# Patient Record
Sex: Female | Born: 1975 | Race: White | Hispanic: No | Marital: Married | Smoking: Former smoker
Health system: Southern US, Community
[De-identification: ages and names within clinical notes are randomized; demographics above are authoritative.]

## PROBLEM LIST (undated history)

## (undated) DIAGNOSIS — G4730 Sleep apnea, unspecified: Secondary | ICD-10-CM

## (undated) DIAGNOSIS — F32A Depression, unspecified: Secondary | ICD-10-CM

## (undated) DIAGNOSIS — T8859XA Other complications of anesthesia, initial encounter: Secondary | ICD-10-CM

## (undated) DIAGNOSIS — K219 Gastro-esophageal reflux disease without esophagitis: Secondary | ICD-10-CM

## (undated) DIAGNOSIS — J329 Chronic sinusitis, unspecified: Secondary | ICD-10-CM

## (undated) DIAGNOSIS — R0602 Shortness of breath: Secondary | ICD-10-CM

## (undated) DIAGNOSIS — F419 Anxiety disorder, unspecified: Secondary | ICD-10-CM

## (undated) DIAGNOSIS — F329 Major depressive disorder, single episode, unspecified: Secondary | ICD-10-CM

## (undated) DIAGNOSIS — M1990 Unspecified osteoarthritis, unspecified site: Secondary | ICD-10-CM

## (undated) DIAGNOSIS — F909 Attention-deficit hyperactivity disorder, unspecified type: Secondary | ICD-10-CM

## (undated) DIAGNOSIS — E7800 Pure hypercholesterolemia, unspecified: Secondary | ICD-10-CM

## (undated) DIAGNOSIS — K649 Unspecified hemorrhoids: Secondary | ICD-10-CM

## (undated) DIAGNOSIS — Z8709 Personal history of other diseases of the respiratory system: Secondary | ICD-10-CM

## (undated) DIAGNOSIS — E2839 Other primary ovarian failure: Secondary | ICD-10-CM

## (undated) DIAGNOSIS — I10 Essential (primary) hypertension: Secondary | ICD-10-CM

## (undated) DIAGNOSIS — Z8739 Personal history of other diseases of the musculoskeletal system and connective tissue: Secondary | ICD-10-CM

## (undated) DIAGNOSIS — R519 Headache, unspecified: Secondary | ICD-10-CM

## (undated) DIAGNOSIS — K2950 Unspecified chronic gastritis without bleeding: Secondary | ICD-10-CM

## (undated) DIAGNOSIS — S82831A Other fracture of upper and lower end of right fibula, initial encounter for closed fracture: Secondary | ICD-10-CM

## (undated) DIAGNOSIS — N390 Urinary tract infection, site not specified: Secondary | ICD-10-CM

## (undated) DIAGNOSIS — T4145XA Adverse effect of unspecified anesthetic, initial encounter: Secondary | ICD-10-CM

## (undated) DIAGNOSIS — Z973 Presence of spectacles and contact lenses: Secondary | ICD-10-CM

## (undated) DIAGNOSIS — R51 Headache: Secondary | ICD-10-CM

## (undated) DIAGNOSIS — F4310 Post-traumatic stress disorder, unspecified: Secondary | ICD-10-CM

## (undated) HISTORY — DX: Essential (primary) hypertension: I10

## (undated) HISTORY — DX: Unspecified hemorrhoids: K64.9

## (undated) HISTORY — DX: Attention-deficit hyperactivity disorder, unspecified type: F90.9

## (undated) HISTORY — PX: ESOPHAGOGASTRODUODENOSCOPY: SHX1529

## (undated) HISTORY — DX: Post-traumatic stress disorder, unspecified: F43.10

## (undated) HISTORY — PX: WISDOM TOOTH EXTRACTION: SHX21

## (undated) HISTORY — DX: Unspecified chronic gastritis without bleeding: K29.50

---

## 2008-05-24 HISTORY — PX: CHOLECYSTECTOMY: SHX55

## 2011-05-26 ENCOUNTER — Emergency Department (HOSPITAL_COMMUNITY)
Admission: EM | Admit: 2011-05-26 | Discharge: 2011-05-26 | Disposition: A | Discharge status: Home / Self Care | Payer: Self-pay | Attending: Emergency Medicine | Admitting: Emergency Medicine

## 2011-05-26 DIAGNOSIS — N390 Urinary tract infection, site not specified: Secondary | ICD-10-CM | POA: Insufficient documentation

## 2011-05-26 DIAGNOSIS — L988 Other specified disorders of the skin and subcutaneous tissue: Secondary | ICD-10-CM | POA: Insufficient documentation

## 2011-05-26 LAB — POCT PREGNANCY, URINE: Preg Test, Ur: NEGATIVE

## 2011-05-26 LAB — URINALYSIS, ROUTINE W REFLEX MICROSCOPIC
Glucose, UA: NEGATIVE mg/dL
Hgb urine dipstick: NEGATIVE
Protein, ur: NEGATIVE mg/dL

## 2011-05-26 LAB — URINE MICROSCOPIC-ADD ON

## 2011-05-26 MED ORDER — CIPROFLOXACIN HCL 250 MG PO TABS: 250.0000 mg | ORAL_TABLET | Freq: Two times a day (BID) | ORAL | Status: AC

## 2011-05-26 NOTE — ED Notes (Signed)
Pt reports symptoms started yesterday feeling tired, having body aches and decreased appetite. Pt reports symptoms started suddenly.  Pt reports slight headaches and mild dizziness. Pt reports low grade fever of 99.5 earlier today.  Pt denies history of others sick around her. On and off congestion. History of sinus infection, but her symptoms are different at this time.  Mild throat soreness today since coming to ED.  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

## 2011-05-26 NOTE — ED Notes (Signed)
Pt alert, nad, c/o fever, cough, body aches, onset was yesterday, resp even unlabored, skin pwd, ambulates to room, no s/s distress noted

## 2011-05-26 NOTE — ED Provider Notes (Signed)
History     CSN: [id number]  Arrival date & time 05/26/11  1758   First MD Initiated Contact with Patient 05/26/11 2106      Chief Complaint  Patient presents with  . Generalized Body Aches    x 3 days, "worn out", low grade fevers    (Consider location/radiation/quality/duration/timing/severity/associated sxs/prior treatment) The history is provided by the patient.  Pt presents with CC of generalized body aches and fatigue which started about 3 days ago. Has felt as if she's had a low grade fever as well. Denies chest pain, abd pain, dyspnea, URI sx, sore throat, headache. Has not had any nausea, vomiting, diarrhea, or urinary sx. Denies vaginal discharge. States she has been sleeping more than usual over the past several days.   She is new to the area and has not established care with PCP yet. States she was told by previous PCP that she may be borderline hypothyroid. Also recalls that she was briefly out of her Celexa recently for about a week but has resumed taking this now.  History reviewed. No pertinent past medical history.  Past Surgical History  Procedure Date  . Cholecystectomy 2010  . Cholecystectomy     No family history on file.  History  Substance Use Topics  . Smoking status: Former Games developer  . Smokeless tobacco: Not on file  . Alcohol Use: Yes     occasionally    OB History    Grav Para Term Preterm Abortions TAB SAB Ect Mult Living                  Review of Systems Comprehensive ROS reviewed and are negative except as indicated above  Allergies  Sulfa antibiotics  Home Medications   Current Outpatient Rx  Name Route Sig Dispense Refill  . CITALOPRAM HYDROBROMIDE 40 MG PO TABS Oral Take 40 mg by mouth daily.      Marland Kitchen NORGESTIMATE-ETH ESTRADIOL 0.25-35 MG-MCG PO TABS Oral Take 1 tablet by mouth daily.        BP 123/87  Pulse 84  Temp(Src) 98.9 F (37.2 C) (Oral)  Resp 20  Wt 240 lb (108.863 kg)  SpO2 99%  LMP 04/24/2011  Physical Exam    Nursing note and vitals reviewed. Constitutional: She is oriented to person, place, and time. She appears well-developed and well-nourished.  Non-toxic appearance. No distress.  HENT:  Head: Normocephalic and atraumatic.  Right Ear: External ear normal.  Left Ear: External ear normal.  Mouth/Throat: Oropharynx is clear and moist. No oropharyngeal exudate.       TMs nl, no tenderness to palp over sinuses  Eyes: Conjunctivae and EOM are normal. Pupils are equal, round, and reactive to light.  Neck: Normal range of motion. Neck supple. No thyromegaly present.  Cardiovascular: Normal rate, regular rhythm and normal heart sounds.   Pulmonary/Chest: Effort normal and breath sounds normal. No respiratory distress. She exhibits no tenderness.  Abdominal: Soft. Bowel sounds are normal. There is no tenderness. There is no rebound and no guarding.       Negative CVA tenderness b/l  Musculoskeletal: Normal range of motion. She exhibits no edema and no tenderness.  Lymphadenopathy:    She has no cervical adenopathy.  Neurological: She is alert and oriented to person, place, and time.  Skin: Skin is warm and dry. She is not diaphoretic.       Several small erythematous papules most likely c/w flea bites noted to LEs  Psychiatric: She has a normal  mood and affect.    ED Course  Procedures (including critical care time)  Labs Reviewed  URINALYSIS, ROUTINE W REFLEX MICROSCOPIC - Abnormal; Notable for the following:    APPearance CLOUDY (*)    Leukocytes, UA MODERATE (*)    All other components within normal limits  URINE MICROSCOPIC-ADD ON - Abnormal; Notable for the following:    Squamous Epithelial / LPF FEW (*)    Bacteria, UA MANY (*)    All other components within normal limits  POCT PREGNANCY, URINE  POCT PREGNANCY, URINE   No results found.   1. Urinary tract infection       MDM  UA shows probable UTI; will tx. Urine sent for cx. Nontox appearing. Discussed importance of  establishing PCP f/u should fatigue persist. Discussed return precautions.        Grant Fontana, [place] 05/27/11 [phone]

## 2011-05-27 NOTE — ED Provider Notes (Signed)
Medical screening examination/treatment/procedure(s) were performed by non-physician practitioner and as supervising physician I was immediately available for consultation/collaboration.  Juliet Rude. Rubin Payor, MD 05/27/11 [phone]

## 2011-05-28 LAB — URINE CULTURE: Colony Count: >=100000

## 2011-05-29 NOTE — ED Notes (Signed)
+   Urine culture. Treated with Cipro, sensitive to same per protocol MD. 

## 2011-06-21 ENCOUNTER — Encounter (HOSPITAL_COMMUNITY): Payer: Self-pay | Admitting: *Deleted

## 2011-06-21 ENCOUNTER — Emergency Department (HOSPITAL_COMMUNITY)
Admission: EM | Admit: 2011-06-21 | Discharge: 2011-06-22 | Disposition: A | Discharge status: Home / Self Care | Payer: Self-pay | Attending: Emergency Medicine | Admitting: Emergency Medicine

## 2011-06-21 DIAGNOSIS — J029 Acute pharyngitis, unspecified: Secondary | ICD-10-CM | POA: Insufficient documentation

## 2011-06-21 DIAGNOSIS — T148 Other injury of unspecified body region: Secondary | ICD-10-CM | POA: Insufficient documentation

## 2011-06-21 DIAGNOSIS — R5383 Other fatigue: Secondary | ICD-10-CM | POA: Insufficient documentation

## 2011-06-21 DIAGNOSIS — W57XXXA Bitten or stung by nonvenomous insect and other nonvenomous arthropods, initial encounter: Secondary | ICD-10-CM | POA: Insufficient documentation

## 2011-06-21 DIAGNOSIS — R5381 Other malaise: Secondary | ICD-10-CM | POA: Insufficient documentation

## 2011-06-21 HISTORY — DX: Major depressive disorder, single episode, unspecified: F32.9

## 2011-06-21 HISTORY — DX: Other primary ovarian failure: E28.39

## 2011-06-21 HISTORY — DX: Depression, unspecified: F32.A

## 2011-06-21 LAB — POCT PREGNANCY, URINE: Preg Test, Ur: NEGATIVE

## 2011-06-21 LAB — RAPID STREP SCREEN (MED CTR MEBANE ONLY): Streptococcus, Group A Screen (Direct): NEGATIVE

## 2011-06-21 MED ORDER — CEPHALEXIN 500 MG PO CAPS: 500.0000 mg | ORAL_CAPSULE | Freq: Four times a day (QID) | ORAL | Status: AC

## 2011-06-21 NOTE — ED Provider Notes (Signed)
History     CSN: [id number]  Arrival date & time 06/21/11  1806   None     Chief Complaint  Patient presents with  . Fatigue  . Pain  . Sore Throat    (Consider location/radiation/quality/duration/timing/severity/associated sxs/prior treatment) Patient is a 36 y.o. female presenting with pharyngitis. The history is provided by the patient. No language interpreter was used.  Sore Throat This is a recurrent problem. The current episode started 1 to 4 weeks ago. The problem occurs constantly. The problem has been gradually worsening. Associated symptoms include fatigue and a sore throat. Pertinent negatives include no abdominal pain, chest pain, chills, congestion, coughing, diaphoresis, fever, joint swelling, nausea, neck pain, numbness, swollen glands, urinary symptoms, vertigo, vomiting or weakness. The symptoms are aggravated by nothing. She has tried nothing for the symptoms.  Reports fatigue, flea bites and sore throat. 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  Past Medical History  Diagnosis Date  . Depression   . Ovarian failure     Past Surgical History  Procedure Date  . Cholecystectomy 2010  . Cholecystectomy     No family history on file.  History  Substance Use Topics  . Smoking status: Former Games developer  . Smokeless tobacco: Not on file  . Alcohol Use: Yes     occasionally    OB History    Grav Para Term Preterm Abortions TAB SAB Ect Mult Living                  Review of Systems  Constitutional: Positive for fatigue. Negative for fever, chills and diaphoresis.  HENT: Positive for sore throat. Negative for congestion and neck pain.   Respiratory: Negative for cough.   Cardiovascular: Negative for chest pain.  Gastrointestinal: Negative for nausea, vomiting and abdominal pain.  Musculoskeletal: Negative for joint swelling.  Neurological: Negative for vertigo, weakness and numbness.  All other systems reviewed and are  negative.    Allergies  Sulfa antibiotics  Home Medications   Current Outpatient Rx  Name Route Sig Dispense Refill  . CITALOPRAM HYDROBROMIDE 40 MG PO TABS Oral Take 40 mg by mouth daily.      Marland Kitchen NORGESTIMATE-ETH ESTRADIOL 0.25-35 MG-MCG PO TABS Oral Take 1 tablet by mouth daily.        BP 126/80  Pulse 89  Temp(Src) 99.3 F (37.4 C) (Oral)  Resp 16  Wt 258 lb 12.8 oz (117.391 kg)  SpO2 99%  LMP 04/24/2011  Physical Exam  Nursing note and vitals reviewed. Constitutional: She is oriented to person, place, and time. She appears well-developed and well-nourished.  HENT:  Head: Normocephalic and atraumatic.  Right Ear: Tympanic membrane, external ear and ear canal normal. No drainage or tenderness.  Left Ear: Tympanic membrane, external ear and ear canal normal. No drainage or tenderness.  Nose: No rhinorrhea or sinus tenderness. Right sinus exhibits no maxillary sinus tenderness and no frontal sinus tenderness. Left sinus exhibits no maxillary sinus tenderness and no frontal sinus tenderness.  Mouth/Throat: Uvula is midline and mucous membranes are normal. Posterior oropharyngeal erythema present. No oropharyngeal exudate or posterior oropharyngeal edema.  Eyes: Conjunctivae and EOM are normal. Pupils are equal, round, and reactive to light.  Neck: Normal range of motion. Neck supple.  Cardiovascular: Normal rate, regular rhythm, normal heart sounds and intact distal pulses.  Exam reveals no gallop and no friction rub.   No murmur heard. Pulmonary/Chest: Effort normal and breath sounds normal.  Abdominal: Soft.  Bowel sounds are normal.  Musculoskeletal: Normal range of motion. She exhibits no edema and no tenderness.  Neurological: She is alert and oriented to person, place, and time. She has normal reflexes.  Skin: Skin is warm and dry.       Multiple flea bites to LE and UE  Psychiatric: She has a normal mood and affect.    ED Course  Procedures (including critical care  time)  Labs Reviewed - No data to display No results found.   No diagnosis found.    MDM  Sore throat fatigue and multiple flea bites.  Keflex for flea bites.  Negative strep.  No thyroid goiter detected.  Will get a pcp to follow up with.        Jethro Bastos, NP 06/22/11 1158

## 2011-06-21 NOTE — ED Notes (Signed)
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

## 2011-06-22 NOTE — ED Provider Notes (Signed)
Medical screening examination/treatment/procedure(s) were performed by non-physician practitioner and as supervising physician I was immediately available for consultation/collaboration.   Hanley Seamen, MD 06/22/11 2253

## 2011-09-30 ENCOUNTER — Encounter (HOSPITAL_COMMUNITY): Payer: Self-pay | Admitting: *Deleted

## 2011-09-30 ENCOUNTER — Emergency Department (HOSPITAL_COMMUNITY)
Admission: EM | Admit: 2011-09-30 | Discharge: 2011-09-30 | Disposition: A | Discharge status: Home / Self Care | Payer: Self-pay | Attending: Emergency Medicine | Admitting: Emergency Medicine

## 2011-09-30 DIAGNOSIS — J309 Allergic rhinitis, unspecified: Secondary | ICD-10-CM | POA: Insufficient documentation

## 2011-09-30 DIAGNOSIS — Z87891 Personal history of nicotine dependence: Secondary | ICD-10-CM | POA: Insufficient documentation

## 2011-09-30 MED ORDER — MOMETASONE FUROATE 50 MCG/ACT NA SUSP: 2.0000 | Freq: Every day | NASAL | Status: DC

## 2011-09-30 NOTE — ED Notes (Signed)
Pt c/o fatigue, sore throat, hoarse voice, slight HA and nasal stuffiness x's a "couple of days now." pt denies n/v/d. Also reports fever.

## 2011-09-30 NOTE — Discharge Instructions (Signed)
Your symptoms are likely due to allergic rhinitis (seasonal allergies). You have been given a prescription for Nasonex. Please take this as prescribed, along with saline nose spray. You may also try Afrin for congestion, but do not use for more than 4 days. Return to the ED with high fever or otherwise worsening condition.  RESOURCE GUIDE  Dental Problems  Patients with Medicaid: [facility] [phone] [address]                                           [phone] [address] Phone:  [phone]                                                  Phone:  [phone]  If unable to pay or uninsured, contact:  [facility] or [facility]. to become qualified for the adult dental clinic.  Chronic Pain Problems Contact [facility]  [phone] Patients need to be referred by their primary care doctor.  Insufficient Money for Medicine Contact [employer] Way:  call "211" or [facility] [phone].  No Primary Care Doctor Call [facility]  [phone] Other agencies that provide inexpensive medical care    [facility]  [phone]    [facility]  [phone]    [facility]  [phone]    [facility]  [phone]    [facility]  7631035964    [facility]  [phone]  Psychological Services [facility]  [phone] [facility] Services  [phone] [facility]   [phone] (emergency services [phone])  Substance Abuse Resources [facility]  9303687511 [facility] [phone] [facility] [phone] Floydene Flock [phone] Residential & Outpatient Substance Abuse Program  [phone]  Abuse/Neglect [facility] Child Abuse Hotline 516-447-0066 [facility] Child Abuse Hotline (754)083-9808 (After Hours)  Emergency Shelter [facility] [phone]  Maternity  Homes [facility] at [facility] [place] [facility] [place] [phone] [facility] 5164665423  MRSA Hotline #:   228-666-1329    [facility] Resources  [facility] [place]     [employer]                          [facility] Dept. [address] [place]                       [address]      [address]  [place]                                                Cristobal Goldmann Phone:  [phone]  Phone:  [phone]                 Phone:  [phone]  [facility] Phone:  [phone]  [facility] Child Abuse Hotline 360-528-8457 838-426-3502 (After Hours)  Allergic Rhinitis Allergic rhinitis is when the mucous membranes in the nose respond to allergens. Allergens are particles in the air that cause your body to have an allergic reaction. This causes you to release allergic antibodies. Through a chain of events, these eventually cause you to release histamine into the blood stream (hence the use of antihistamines). Although meant to be protective to the body, it is this release that causes your discomfort, such as frequent sneezing, congestion and an itchy runny nose.  CAUSES  The pollen allergens may come from grasses, trees, and weeds. This is seasonal allergic rhinitis, or "hay fever." Other allergens cause year-round allergic rhinitis (perennial allergic rhinitis) such as house dust mite allergen, pet dander and mold spores.  SYMPTOMS   Nasal stuffiness (congestion).   Runny, itchy nose with sneezing and tearing of the eyes.   There is often an itching of the mouth, eyes and ears.  It cannot be cured, but it can be controlled with medications. DIAGNOSIS  If you are unable to determine the offending allergen, skin or blood testing may find it. TREATMENT   Avoid the allergen.   Medications and allergy shots (immunotherapy) can help.   Hay fever may  often be treated with antihistamines in pill or nasal spray forms. Antihistamines block the effects of histamine. There are over-the-counter medicines that may help with nasal congestion and swelling around the eyes. Check with your caregiver before taking or giving this medicine.  If the treatment above does not work, there are many new medications your caregiver can prescribe. Stronger medications may be used if initial measures are ineffective. Desensitizing injections can be used if medications and avoidance fails. Desensitization is when a patient is given ongoing shots until the body becomes less sensitive to the allergen. Make sure you follow up with your caregiver if problems continue. SEEK MEDICAL CARE IF:   You develop fever (more than 100.5 F (38.1 C).   You develop a cough that does not stop easily (persistent).   You have shortness of breath.   You start wheezing.   Symptoms interfere with normal daily activities.  Document Released: 02/02/2001 Document Revised: 04/29/2011 Document Reviewed: 08/14/2008 [facility] Patient Information 2012 [place], [place].

## 2011-09-30 NOTE — ED Provider Notes (Signed)
History     CSN: [id number]  Arrival date & time 09/30/11  1928   First MD Initiated Contact with Patient 09/30/11 2101      Chief Complaint  Patient presents with  . Sore Throat  . Nasal Congestion    (Consider location/radiation/quality/duration/timing/severity/associated sxs/prior treatment) HPI History from patient. 36 year old female who presents with fatigue, congestion, sore throat, cough for the past 4 days. She states that she believes she has had a "low-grade fever" but has not taken her temperature. Denies any chills. No changes in appetite. Cough has been nonproductive in nature without hemoptysis. She denies chest pain or shortness of breath with this. She does have a history of seasonal allergic rhinitis, and believes that it is possible that her symptoms are related to this. She currently is taking Zyrtec daily for her allergic rhinitis. She denies nausea, vomiting, diarrhea, abdominal pain. Denies urinary symptoms. Has been taking over-the-counter medications, such as Tylenol cough and cold without relief.  Past Medical History  Diagnosis Date  . Depression   . Ovarian failure     Past Surgical History  Procedure Date  . Cholecystectomy 2010  . Cholecystectomy     History reviewed. No pertinent family history.  History  Substance Use Topics  . Smoking status: Former Games developer  . Smokeless tobacco: Not on file  . Alcohol Use: Yes     occasionally    OB History    Grav Para Term Preterm Abortions TAB SAB Ect Mult Living                  Review of Systems as per history of present illness  Allergies  Sulfa antibiotics  Home Medications   Current Outpatient Rx  Name Route Sig Dispense Refill  . CETIRIZINE HCL 10 MG PO TABS Oral Take 10 mg by mouth daily.    Marland Kitchen VITAMIN D3 3000 UNITS PO TABS Oral Take 1 tablet by mouth daily.    Marland Kitchen CITALOPRAM HYDROBROMIDE 40 MG PO TABS Oral Take 40 mg by mouth daily.      Marland Kitchen VITAMIN B 12 PO Oral Take 1 tablet by mouth  daily.    Marland Kitchen KELP PO Oral Take 1 tablet by mouth every morning. Hyperthyroidism    . NAPROXEN 500 MG PO TABS Oral Take 500 mg by mouth every evening.      BP 116/92  Pulse 70  Temp(Src) 99.1 F (37.3 C) (Oral)  Resp 18  SpO2 99%  Physical Exam  Nursing note and vitals reviewed. Constitutional: She appears well-developed and well-nourished. No distress.  HENT:  Head: Normocephalic and atraumatic.  Right Ear: External ear normal.  Left Ear: External ear normal.  Mouth/Throat: Oropharynx is clear and moist. No oropharyngeal exudate.       Tympanic membranes normal bilaterally. Sinuses nontender to palpation or percussion. Posterior oropharynx clear without erythema.  Eyes: Conjunctivae and EOM are normal. Pupils are equal, round, and reactive to light.  Neck: Normal range of motion. Neck supple.  Cardiovascular: Normal rate, regular rhythm and normal heart sounds.   Pulmonary/Chest: Effort normal and breath sounds normal. She exhibits no tenderness.  Abdominal: Soft. There is no tenderness. There is no rebound and no guarding.  Musculoskeletal: Normal range of motion.  Lymphadenopathy:    She has no cervical adenopathy.  Neurological: She is alert.  Skin: Skin is warm and dry. She is not diaphoretic.  Psychiatric: She has a normal mood and affect.    ED Course  Procedures (including critical  care time)  Labs Reviewed - No data to display No results found.   1. Allergic rhinitis       MDM  Patient with history of allergic rhinitis presents with congestion, sore throat, fatigue for the past several days. Suspect this is likely related to her allergic rhinitis. She states that she has had Nasonex in the past and responded well to this. Prescription given for this. She is encouraged to continue her current treatments. Discussed adding saline nasal spray/neti pot. Return precautions discussed. She verbalized understanding and was agreeable with this  plan.        Grant Fontana, [place] 10/01/11 2136

## 2011-10-03 NOTE — ED Provider Notes (Signed)
Medical screening examination/treatment/procedure(s) were performed by non-physician practitioner and as supervising physician I was immediately available for consultation/collaboration.  Aranda Bihm, MD 10/03/11 1108 

## 2011-10-05 ENCOUNTER — Ambulatory Visit: Payer: Self-pay | Admitting: Physician Assistant

## 2011-10-05 ENCOUNTER — Encounter: Payer: Self-pay | Admitting: Physician Assistant

## 2011-10-05 VITALS — BP 119/87 | HR 103 | Temp 98.6°F | Resp 18 | Ht 69.0 in | Wt 279.4 lb

## 2011-10-05 DIAGNOSIS — J029 Acute pharyngitis, unspecified: Secondary | ICD-10-CM

## 2011-10-05 DIAGNOSIS — J019 Acute sinusitis, unspecified: Secondary | ICD-10-CM

## 2011-10-05 MED ORDER — AMOXICILLIN 500 MG PO CAPS: 500.0000 mg | ORAL_CAPSULE | Freq: Three times a day (TID) | ORAL | Status: AC

## 2011-10-05 NOTE — Progress Notes (Signed)
  Subjective:    Patient ID: Catherine Koch, female    DOB: [birth year], 36 y.o.   MRN: [id number]  HPI 35yo CF new to the practice presents with 10 day h/o ST, sinus congestion, now developing sinus pain.  Slight cough associated with post nasal drip.  Thinks she had a fever initially. Blowing green mucus from nose.  OTC meds help some.  Review of Systems  All other systems reviewed and are negative.       Objective:   Physical Exam  Vitals reviewed. Constitutional: She is oriented to person, place, and time. She appears well-developed and well-nourished.  HENT:  Head: Normocephalic and atraumatic.  Mouth/Throat: No oropharyngeal exudate (throat with 2+ erythema, no exudate. no swelling.).  Neck: Normal range of motion. Neck supple.  Cardiovascular: Normal rate, regular rhythm and normal heart sounds.   Pulmonary/Chest: Effort normal and breath sounds normal.  Lymphadenopathy:    She has no cervical adenopathy.  Neurological: She is alert and oriented to person, place, and time.  Skin: Skin is warm and dry.        Assessment & Plan:  Pharyngitis and sinusitis-continue OTC meds, amoxicillin.

## 2011-10-05 NOTE — Patient Instructions (Signed)
Fluids, rest, salt water gargles

## 2011-10-07 ENCOUNTER — Telehealth: Payer: Self-pay

## 2011-10-07 NOTE — Telephone Encounter (Signed)
Pt states that she is not any better, pt states that she has been taking the antibiotic that she was prescribed but it does not seem to be helping. Also pt would like an out of work note for these dates: 10/04/11-10/11/11.

## 2011-10-08 NOTE — Telephone Encounter (Signed)
Pt came into 102 and is in lobby waiting to get her [facility] note.

## 2011-10-08 NOTE — Telephone Encounter (Signed)
Steward Drone gave pt message from [place].

## 2011-10-08 NOTE — Telephone Encounter (Signed)
She needs to be seen of she is not better and has had to be out of work for 1 week.

## 2011-10-09 ENCOUNTER — Telehealth: Payer: Self-pay

## 2011-10-09 NOTE — Telephone Encounter (Signed)
Pt in office seen by [facility] pt has been out of work since Monday 10-04-11 pt needs dr note to return to work, pt is still coughing stuff  Up and is losing her voice, she would like to know if Dr reccommends she be out of work or does she need  To be seen again, she is just not able to work she still feels bad. Please contact pt she is still needing an rtw note for 05-13 thru???

## 2011-10-10 ENCOUNTER — Ambulatory Visit: Payer: Self-pay | Admitting: Family Medicine

## 2011-10-10 VITALS — BP 136/87 | HR 80 | Temp 98.2°F | Resp 16 | Ht 68.5 in | Wt 280.6 lb

## 2011-10-10 DIAGNOSIS — J019 Acute sinusitis, unspecified: Secondary | ICD-10-CM

## 2011-10-10 MED ORDER — HYDROCODONE-HOMATROPINE 5-1.5 MG/5ML PO SYRP: 5.0000 mL | ORAL_SOLUTION | Freq: Three times a day (TID) | ORAL | Status: AC | PRN

## 2011-10-10 MED ORDER — AZITHROMYCIN 250 MG PO TABS: ORAL_TABLET | ORAL | Status: AC

## 2011-10-10 MED ORDER — PREDNISONE 20 MG PO TABS: ORAL_TABLET | ORAL | Status: AC

## 2011-10-10 NOTE — Telephone Encounter (Signed)
Please advise patient to RTC for re-evaluation. 

## 2011-10-10 NOTE — Telephone Encounter (Signed)
Patient notified and will RTC.

## 2011-10-10 NOTE — Progress Notes (Signed)
  Subjective:    Patient ID: Catherine Koch, female    DOB: 1975/07/20, 36 y.o.   MRN: [id number]  HPI 36 yo female with cough and sore throat.  Evaluated in ED for same 5/9 - given nasonex for AR.  Evaluated here 5/14 - pharyngitis/sinusitis - and given amoxicillin. No better, maybe worse.  Hurts to swallow, talk, hoarse.  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  Cough has increased.  Productive.  Right ear tingles.  Sinus drainage and pain.  SUbjective fevers. FAtigue and achy as well.      Review of Systems Negative except as per HPI     Objective:   Physical Exam  Constitutional: She appears well-developed. No distress.  HENT:  Right Ear: Tympanic membrane, external ear and ear canal normal. Tympanic membrane is not injected, not scarred, not perforated, not erythematous, not retracted and not bulging.  Left Ear: Tympanic membrane, external ear and ear canal normal. Tympanic membrane is not injected, not scarred, not perforated, not erythematous, not retracted and not bulging.  Nose: No mucosal edema or rhinorrhea. Right sinus exhibits no maxillary sinus tenderness and no frontal sinus tenderness. Left sinus exhibits no maxillary sinus tenderness and no frontal sinus tenderness.  Mouth/Throat: Uvula is midline, oropharynx is clear and moist and mucous membranes are normal. No oropharyngeal exudate or tonsillar abscesses.  Cardiovascular: Normal rate, regular rhythm, normal heart sounds and intact distal pulses.   No murmur heard. Pulmonary/Chest: Effort normal and breath sounds normal. No respiratory distress. She has no wheezes. She has no rales.  Lymphadenopathy:       Head (right side): No submandibular and no preauricular adenopathy present.       Head (left side): No submandibular and no preauricular adenopathy present.       Right cervical: No superficial cervical and no posterior cervical adenopathy present.      Left cervical: No superficial cervical and no posterior  cervical adenopathy present.       Right: No supraclavicular adenopathy present.       Left: No supraclavicular adenopathy present.  Skin: Skin is warm and dry.          Assessment & Plan:  Persistent URI/worsening cough - bronchitis - prednisone, zpak, hycodan.

## 2011-12-10 ENCOUNTER — Emergency Department (HOSPITAL_COMMUNITY)
Admission: EM | Admit: 2011-12-10 | Discharge: 2011-12-10 | Disposition: A | Discharge status: Home / Self Care | Payer: Self-pay | Attending: Emergency Medicine | Admitting: Emergency Medicine

## 2011-12-10 ENCOUNTER — Encounter (HOSPITAL_COMMUNITY): Payer: Self-pay | Admitting: *Deleted

## 2011-12-10 DIAGNOSIS — F3289 Other specified depressive episodes: Secondary | ICD-10-CM | POA: Insufficient documentation

## 2011-12-10 DIAGNOSIS — R110 Nausea: Secondary | ICD-10-CM | POA: Insufficient documentation

## 2011-12-10 DIAGNOSIS — T887XXA Unspecified adverse effect of drug or medicament, initial encounter: Secondary | ICD-10-CM

## 2011-12-10 DIAGNOSIS — R5381 Other malaise: Secondary | ICD-10-CM | POA: Insufficient documentation

## 2011-12-10 DIAGNOSIS — R5383 Other fatigue: Secondary | ICD-10-CM

## 2011-12-10 DIAGNOSIS — Z882 Allergy status to sulfonamides status: Secondary | ICD-10-CM | POA: Insufficient documentation

## 2011-12-10 DIAGNOSIS — F329 Major depressive disorder, single episode, unspecified: Secondary | ICD-10-CM | POA: Insufficient documentation

## 2011-12-10 DIAGNOSIS — R51 Headache: Secondary | ICD-10-CM | POA: Insufficient documentation

## 2011-12-10 DIAGNOSIS — Z87891 Personal history of nicotine dependence: Secondary | ICD-10-CM | POA: Insufficient documentation

## 2011-12-10 LAB — POCT PREGNANCY, URINE: Preg Test, Ur: NEGATIVE

## 2011-12-10 MED ORDER — KETOROLAC TROMETHAMINE 60 MG/2ML IM SOLN
60.0000 mg | Freq: Once | INTRAMUSCULAR | Status: AC
  Administered 2011-12-10: 60 mg via INTRAMUSCULAR
  Filled 2011-12-10: qty 2

## 2011-12-10 MED ORDER — CITALOPRAM HYDROBROMIDE 40 MG PO TABS: 40.0000 mg | ORAL_TABLET | Freq: Every day | ORAL | Status: DC

## 2011-12-10 MED ORDER — METOCLOPRAMIDE HCL 5 MG/ML IJ SOLN
10.0000 mg | Freq: Once | INTRAMUSCULAR | Status: AC
  Administered 2011-12-10: 10 mg via INTRAMUSCULAR
  Filled 2011-12-10: qty 2

## 2011-12-10 MED ORDER — DIPHENHYDRAMINE HCL 50 MG/ML IJ SOLN
25.0000 mg | Freq: Once | INTRAMUSCULAR | Status: AC
  Administered 2011-12-10: 25 mg via INTRAMUSCULAR
  Filled 2011-12-10: qty 1

## 2011-12-10 NOTE — ED Provider Notes (Signed)
Medical screening examination/treatment/procedure(s) were conducted as a shared visit with non-physician practitioner(s) and myself.  I personally evaluated the patient during the encounter.  Pt with one week of headaches, recently d/c celexa about same time.  Exam unremarkable, soft tissue swelling to left occiput which patient recently noticed and concerned here, possible lymph node, no signs of infection, tumor.  Pt feeling better after headache cocktail, will d/c home.  Olivia Mackie, MD 12/10/11 [phone]

## 2011-12-10 NOTE — ED Notes (Signed)
For one wk pt c/o body aches; knot noted at back of head x 1 wk; severe migraines with nausea; sensitive to light and sound; low grade fever; feeling tired

## 2011-12-10 NOTE — ED Provider Notes (Signed)
History     CSN: [id number]  Arrival date & time 12/10/11  0009   First MD Initiated Contact with Patient 12/10/11 0028      Chief Complaint  Patient presents with  . Generalized Body Aches    (Consider location/radiation/quality/duration/timing/severity/associated sxs/prior treatment) HPI Comments: Catherine Koch is a 36 y.o. Female who presents c/o intermittent headache x1 week accompanied by nausea, fatigue and vomiting.  She states 1 week ago she noticed a "bump" on the back of her head and has been having "migraines" nightly ever since.  She does not have a history of migraine.  The pain is generalized, but worse over the "bump."  She rates the pain at 7/10 with mild photophobia, but no visual disturbances.  She denies dizziness, numbness, syncopal episodes, speech slurring or weakness.  She has been off her OCP for some time but restarted it 2 weeks ago.  She stopped her Celexa just over 1 week ago when she ran out of her Rx.  She complains of abdominal cramping "like I'm going to start my period" that has been going on x 1 week as well.  Associated with this is generalized fatigue without SOB, CP or arthralgias.  She is without further complaint.    Patient is a 36 y.o. female presenting with headaches.  Headache  This is a recurrent problem. The current episode started more than 2 days ago. The problem occurs constantly. The problem has not changed since onset.The headache is associated with bright light, loud noise, estrogen and the menstrual cycle. Pain location: generalized. The quality of the pain is described as dull and throbbing. The pain is at a severity of 7/10. The pain is moderate. The pain does not radiate. Associated symptoms include malaise/fatigue and nausea. Pertinent negatives include no fever, no syncope, no shortness of breath and no vomiting. She has tried NSAIDs for the symptoms.    Past Medical History  Diagnosis Date  . Depression   . Ovarian failure      Past Surgical History  Procedure Date  . Cholecystectomy 2010  . Cholecystectomy     No family history on file.  History  Substance Use Topics  . Smoking status: Former Games developer  . Smokeless tobacco: Not on file  . Alcohol Use: Yes     occasionally    OB History    Grav Para Term Preterm Abortions TAB SAB Ect Mult Living                  Review of Systems  Constitutional: Positive for malaise/fatigue and fatigue. Negative for fever, diaphoresis, appetite change and unexpected weight change.  HENT: Negative for hearing loss, ear pain, mouth sores, neck pain, neck stiffness and sinus pressure.   Eyes: Negative for pain and visual disturbance.  Respiratory: Negative for cough, chest tightness, shortness of breath and wheezing.   Cardiovascular: Negative for chest pain and syncope.  Gastrointestinal: Positive for nausea and diarrhea (x1 week). Negative for vomiting, abdominal pain and constipation.  Genitourinary: Negative for dysuria, urgency, frequency and hematuria.  Musculoskeletal: Negative for back pain.  Skin: Negative for rash.  Neurological: Positive for headaches. Negative for dizziness, tremors, syncope, weakness, light-headedness and numbness.  Hematological: Does not bruise/bleed easily.  Psychiatric/Behavioral: Negative for disturbed wake/sleep cycle. The patient is not nervous/anxious.     Allergies  Sulfa antibiotics  Home Medications   Current Outpatient Rx  Name Route Sig Dispense Refill  . CETIRIZINE HCL 10 MG PO TABS Oral Take 10 mg  by mouth daily.    Marland Kitchen VITAMIN D3 3000 UNITS PO TABS Oral Take 1 tablet by mouth daily.    Marland Kitchen CITALOPRAM HYDROBROMIDE 40 MG PO TABS Oral Take 40 mg by mouth daily.      Marland Kitchen VITAMIN B 12 PO Oral Take 1 tablet by mouth daily.    Marland Kitchen KELP PO Oral Take 1 tablet by mouth every morning. Hyperthyroidism    . MOMETASONE FUROATE 50 MCG/ACT NA SUSP Nasal Place 2 sprays into the nose daily. 17 g 0  . NAPROXEN 500 MG PO TABS Oral Take  500 mg by mouth every evening.      BP 132/79  Pulse 93  Temp 99.6 F (37.6 C)  Resp 20  SpO2 100%  LMP 05/12/2011  Physical Exam  Nursing note and vitals reviewed. Constitutional: She is oriented to person, place, and time. She appears well-developed and well-nourished. No distress.  HENT:  Head: Normocephalic and atraumatic. Head is without abrasion, without contusion and without laceration. Hair is normal.    Mouth/Throat: Oropharynx is clear and moist. No oropharyngeal exudate.  Eyes: Conjunctivae and EOM are normal. Pupils are equal, round, and reactive to light. No scleral icterus.  Neck: Normal range of motion. Neck supple.  Cardiovascular: Normal rate, regular rhythm, normal heart sounds and intact distal pulses.   Pulmonary/Chest: Effort normal and breath sounds normal. No respiratory distress. She has no wheezes.  Abdominal: Soft. Bowel sounds are normal. She exhibits no mass. There is no tenderness. There is no rebound and no guarding.  Musculoskeletal: Normal range of motion.  Neurological: She is alert and oriented to person, place, and time. No cranial nerve deficit. Coordination normal.  Skin: Skin is warm and dry. She is not diaphoretic.  Psychiatric: She has a normal mood and affect. Her behavior is normal. Judgment and thought content normal.    ED Course  Procedures (including critical care time)  Labs Reviewed - No data to display No results found.   Results for orders placed during the hospital encounter of 12/10/11  POCT PREGNANCY, URINE      Component Value Range   Preg Test, Ur NEGATIVE  NEGATIVE   Tx: Toradol 60mg  IM, Benadryl 25mg  IM and Reglan 10mg  IM administered   1. Headache   2. Fatigue   3. Nausea   4. Medication side effect     MDM  [name] presents with a number of vague complaints this evening many of which I believe to be due to the abrupt cessation of her Celexa.  She has also been struggling with a recurrent headache x  1 week without neurological symptoms.  She has raised no red flags for a pathologic etiology of the headache.  She has an appointment in the AM with the physician who will resume her Celexa prescription.   She has been treated as per above.  I have discussed the patient with Dr. Norlene Campbell who will follow the patient through the evening and re-evaluate as needed.    1:31 AM Adrianna Dudas, Telford Nab Jerris Keltz, PA-C 12/10/11 0132

## 2011-12-10 NOTE — ED Notes (Signed)
Pt c/o of a generalized weakness, migraines, and a "knot" on the back of head.  The "knot" was not palpable.  Pt able to ambulate to her room.  Pt also c/o of light sensitivity but was noted to be able tolerate room lights.  Pt also states that she started back on her birth control recently and ran out of her depression medication (Celexa) last week when her other Sx began.  VSS.

## 2012-01-10 ENCOUNTER — Ambulatory Visit (INDEPENDENT_AMBULATORY_CARE_PROVIDER_SITE_OTHER): Payer: Self-pay | Admitting: Internal Medicine

## 2012-01-10 ENCOUNTER — Encounter: Payer: Self-pay | Admitting: Internal Medicine

## 2012-01-10 VITALS — BP 140/82 | HR 100 | Ht 69.0 in | Wt 291.0 lb

## 2012-01-10 DIAGNOSIS — F329 Major depressive disorder, single episode, unspecified: Secondary | ICD-10-CM

## 2012-01-10 DIAGNOSIS — G4733 Obstructive sleep apnea (adult) (pediatric): Secondary | ICD-10-CM
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
## 2012-01-10 NOTE — Patient Instructions (Addendum)
Order- [facility] Schedule NPSG with split protocol   Dx OSA

## 2012-01-15 ENCOUNTER — Encounter: Payer: Self-pay | Admitting: Internal Medicine

## 2012-01-15 DIAGNOSIS — G4733 Obstructive sleep apnea (adult) (pediatric): Secondary | ICD-10-CM | POA: Insufficient documentation

## 2012-01-15 DIAGNOSIS — F329 Major depressive disorder, single episode, unspecified: Secondary | ICD-10-CM | POA: Insufficient documentation

## 2012-01-15 NOTE — Assessment & Plan Note (Signed)
History and body habitus are consistent with obstructive sleep apnea. We have begun a discussion of sleep hygiene and the importance of weight as well as her responsibility to drive safely. Plan-schedule sleep study

## 2012-01-15 NOTE — Assessment & Plan Note (Signed)
Self-reported chronic depression. Her very irregular sleep schedule is consistent with this

## 2012-01-24 ENCOUNTER — Ambulatory Visit (HOSPITAL_BASED_OUTPATIENT_CLINIC_OR_DEPARTMENT_OTHER): Payer: Self-pay | Attending: Internal Medicine | Admitting: Radiology

## 2012-01-24 VITALS — Ht 69.0 in | Wt 291.0 lb

## 2012-01-24 DIAGNOSIS — G4733 Obstructive sleep apnea (adult) (pediatric): Secondary | ICD-10-CM | POA: Insufficient documentation

## 2012-01-29 DIAGNOSIS — G4733 Obstructive sleep apnea (adult) (pediatric): Secondary | ICD-10-CM

## 2012-01-30 NOTE — Procedures (Signed)
NAME:  Catherine, Koch NO.:  [id number]  MEDICAL RECORD NO.:  [id number]          PATIENT TYPE:  OUT  LOCATION:  SLEEP CENTER                 FACILITY:  [facility]  PHYSICIAN:  Clinton D. Maple Hudson, MD, FCCP, FACPDATE OF BIRTH:  07-27-77  DATE OF STUDY:  01/24/2012                           NOCTURNAL POLYSOMNOGRAM  REFERRING PHYSICIAN:  Clinton D. Young, MD, FCCP, FACP  INDICATION FOR STUDY:  Insomnia with sleep apnea.  EPWORTH SLEEPINESS SCORE:  14/24.  BMI 43, weight 291 pounds, height 69 inches, neck 17 inches.  MEDICATIONS:  Home medications charted and reviewed.  SLEEP ARCHITECTURE:  Split study protocol.  During the diagnostic phase, total sleep time 122 minutes with sleep efficiency 70.7%.  Stage I was 3.7%, stage II 80.3%, stage III 16%, REM absent.  Sleep latency 9.5 minutes, awake after sleep onset 41 minute.  Arousal index 57.5.  Bedtime Medication:  None.  There were sustained intervals of wakefulness lasting over an hour around midnight and again around 2 a.m.  RESPIRATORY DATA:  Split study protocol.  Apnea-hypopnea index (AHI) 62 per hour.  A total of 126 events was scored including 87 obstructive apneas and 39 hypopneas.  Events were not positional.  REM/AHI 0.  CPAP was then titrated to 10 CWP, AHI 1.4 per hour.  She wore a medium ResMed Quattro FX full-face mask with heated humidifier.  OXYGEN DATA:  Before CPAP, snoring was moderately loud with oxygen desaturation to a nadir of 86% on room air.  With CPAP control, snoring was prevented and mean oxygen saturation held 95.7% on room air.  CARDIAC DATA:  Normal sinus rhythm.  MOVEMENT-PARASOMNIA:  No significant movement disturbance. Bathroom x1.  IMPRESSIONS-RECOMMENDATIONS: 1. Severe obstructive sleep apnea/hypopnea syndrome, AHI 62 per hour     with non-positional events.  Moderately loud snoring with oxygen     desaturation to a nadir of 86% on room air. 2. Successful CPAP titration to  10 CWP, AHI 1.4 per hour.  She wore a     medium ResMed Quattro FX full-face mask with heated humidifier.     Snoring was prevented and mean oxygen saturation held 95.7% on     CPAP.     Clinton D. Maple Hudson, MD, [facility], FACP Diplomate, American Board of Sleep Medicine    CDY/MEDQ  D:  01/29/2012 16:35:05  T:  01/30/2012 09:57:24  Job:  [id number]

## 2012-02-24 ENCOUNTER — Ambulatory Visit (INDEPENDENT_AMBULATORY_CARE_PROVIDER_SITE_OTHER): Payer: Self-pay | Admitting: Internal Medicine

## 2012-02-24 ENCOUNTER — Encounter: Payer: Self-pay | Admitting: Internal Medicine

## 2012-02-24 VITALS — BP 124/70 | HR 89 | Ht 69.0 in | Wt 295.6 lb

## 2012-02-24 DIAGNOSIS — Z23 Encounter for immunization: Secondary | ICD-10-CM

## 2012-02-24 DIAGNOSIS — G4733 Obstructive sleep apnea (adult) (pediatric): Secondary | ICD-10-CM

## 2012-02-24 NOTE — Patient Instructions (Addendum)
Order- DME new CPAP 10, mask of choice, humidifier, supplies     Dx OSA  Please call as needed  Flu vax
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
## 2012-03-04 NOTE — Assessment & Plan Note (Signed)
Starting a new CPAP with discussion as above. Distinguishing between sleep deprivation from sleep apnea and lack of energy related to depression may be difficult.

## 2012-04-11 ENCOUNTER — Ambulatory Visit (INDEPENDENT_AMBULATORY_CARE_PROVIDER_SITE_OTHER): Payer: Self-pay | Admitting: Internal Medicine

## 2012-04-11 ENCOUNTER — Other Ambulatory Visit (INDEPENDENT_AMBULATORY_CARE_PROVIDER_SITE_OTHER): Payer: Self-pay

## 2012-04-11 ENCOUNTER — Encounter: Payer: Self-pay | Admitting: Internal Medicine

## 2012-04-11 VITALS — BP 132/68 | HR 78 | Ht 69.0 in | Wt 302.6 lb

## 2012-04-11 DIAGNOSIS — G4733 Obstructive sleep apnea (adult) (pediatric): Secondary | ICD-10-CM

## 2012-04-11 DIAGNOSIS — E0590 Thyrotoxicosis, unspecified without thyrotoxic crisis or storm: Secondary | ICD-10-CM

## 2012-04-11 LAB — TSH: TSH: 1.9 u[IU]/mL (ref 0.35–5.50)
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
## 2012-04-11 NOTE — Patient Instructions (Addendum)
Order- DME [facility]- work with patient on CPAP mask of choice. Needs better fit.   DX OSA  Order- lab-  TSH, T4, T3    Dx hypothyroid  Suggest trying caffeine caplets, like NoDoz     1/2 or 1, twice daily if needed

## 2012-04-21 NOTE — Assessment & Plan Note (Signed)
Good CPAP compliance and control based on download. She still complains of the sense of fatigue or lack of energy with some sleepiness. She is very passive in the exam row and looks extremely deconditioned which is part of the problem. This is not narcolepsy. Plan-recheck thyroid function. Try occasional caffeine tablet, watching for effect. Emphasis on good sleep hygiene and an occasional nap if necessary. Emphasized her responsibility to drive safely.

## 2012-05-23 ENCOUNTER — Encounter: Payer: Self-pay | Admitting: Internal Medicine

## 2012-05-23 ENCOUNTER — Ambulatory Visit (INDEPENDENT_AMBULATORY_CARE_PROVIDER_SITE_OTHER): Payer: Self-pay | Admitting: Internal Medicine

## 2012-05-23 ENCOUNTER — Telehealth: Payer: Self-pay | Admitting: Internal Medicine

## 2012-05-23 VITALS — BP 122/80 | HR 65 | Ht 69.0 in | Wt 306.8 lb

## 2012-05-23 DIAGNOSIS — J0100 Acute maxillary sinusitis, unspecified: Secondary | ICD-10-CM

## 2012-05-23 DIAGNOSIS — G4733 Obstructive sleep apnea (adult) (pediatric): Secondary | ICD-10-CM

## 2012-05-23 MED ORDER — DOXYCYCLINE HYCLATE 100 MG PO TABS: ORAL_TABLET | ORAL | Status: DC

## 2012-05-23 MED ORDER — AMOXICILLIN 500 MG PO TABS: ORAL_TABLET | ORAL | Status: DC

## 2012-05-23 NOTE — Telephone Encounter (Signed)
Patient just seen this morning, patient states abx prescribed is too expensive ($40).  Patient would like another abx called in that is cheaper.  Dr. Maple Hudson please advise, thank you  Karin Golden-- Joellyn Quails  Patient Instructions     Script sent for doxycycline antibiotic 2 today then one daily  Good luck with new CPAP mask. We will keep pressure at 10  Please call as needed    Allergies  Allergen Reactions  . Sulfa Antibiotics Swelling    Throat swelling

## 2012-05-23 NOTE — Patient Instructions (Addendum)
Script sent for doxycycline antibiotic  2 today then one daily  Good luck with new CPAP mask. We will keep pressure at 10  Please call as needed
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
## 2012-05-23 NOTE — Telephone Encounter (Signed)
Per CY-- Amoxicillin 500mg  #21 1 tab TID.  Spoke with patient, patient aware rx sent in to [facility].  Nothing further needed at this time.

## 2012-05-31 ENCOUNTER — Encounter (HOSPITAL_COMMUNITY): Payer: Self-pay | Admitting: Emergency Medicine

## 2012-05-31 ENCOUNTER — Emergency Department (HOSPITAL_COMMUNITY)
Admission: EM | Admit: 2012-05-31 | Discharge: 2012-06-01 | Disposition: A | Discharge status: Home / Self Care | Payer: Self-pay | Attending: Emergency Medicine | Admitting: Emergency Medicine

## 2012-05-31 DIAGNOSIS — Z862 Personal history of diseases of the blood and blood-forming organs and certain disorders involving the immune mechanism: Secondary | ICD-10-CM | POA: Insufficient documentation

## 2012-05-31 DIAGNOSIS — M545 Low back pain: Secondary | ICD-10-CM

## 2012-05-31 DIAGNOSIS — Y9389 Activity, other specified: Secondary | ICD-10-CM | POA: Insufficient documentation

## 2012-05-31 DIAGNOSIS — G4730 Sleep apnea, unspecified: Secondary | ICD-10-CM | POA: Insufficient documentation

## 2012-05-31 DIAGNOSIS — Z87891 Personal history of nicotine dependence: Secondary | ICD-10-CM | POA: Insufficient documentation

## 2012-05-31 DIAGNOSIS — Z79899 Other long term (current) drug therapy: Secondary | ICD-10-CM | POA: Insufficient documentation

## 2012-05-31 DIAGNOSIS — Y929 Unspecified place or not applicable: Secondary | ICD-10-CM | POA: Insufficient documentation

## 2012-05-31 DIAGNOSIS — Z9089 Acquired absence of other organs: Secondary | ICD-10-CM | POA: Insufficient documentation

## 2012-05-31 DIAGNOSIS — F3289 Other specified depressive episodes: Secondary | ICD-10-CM | POA: Insufficient documentation

## 2012-05-31 DIAGNOSIS — F329 Major depressive disorder, single episode, unspecified: Secondary | ICD-10-CM | POA: Insufficient documentation

## 2012-05-31 DIAGNOSIS — X500XXA Overexertion from strenuous movement or load, initial encounter: Secondary | ICD-10-CM | POA: Insufficient documentation

## 2012-05-31 DIAGNOSIS — S99929A Unspecified injury of unspecified foot, initial encounter: Secondary | ICD-10-CM | POA: Insufficient documentation

## 2012-05-31 DIAGNOSIS — IMO0002 Reserved for concepts with insufficient information to code with codable children: Secondary | ICD-10-CM | POA: Insufficient documentation

## 2012-05-31 DIAGNOSIS — S8990XA Unspecified injury of unspecified lower leg, initial encounter: Secondary | ICD-10-CM | POA: Insufficient documentation

## 2012-05-31 DIAGNOSIS — Z8639 Personal history of other endocrine, nutritional and metabolic disease: Secondary | ICD-10-CM | POA: Insufficient documentation

## 2012-05-31 HISTORY — DX: Sleep apnea, unspecified: G47.30

## 2012-05-31 NOTE — ED Notes (Signed)
Patient states that she made the same motion up and down while cleaning today and the last time that she tried to stand up she started to have lower back pain. The patient reports that she has a "funny feeling" in her bilateral thighs and knees

## 2012-06-01 MED ORDER — DIAZEPAM 5 MG PO TABS: 5.0000 mg | ORAL_TABLET | Freq: Two times a day (BID) | ORAL | Status: DC

## 2012-06-01 MED ORDER — HYDROCODONE-ACETAMINOPHEN 5-325 MG PO TABS
1.0000 | ORAL_TABLET | Freq: Once | ORAL | Status: AC
  Administered 2012-06-01: 1 via ORAL
  Filled 2012-06-01: qty 1

## 2012-06-01 MED ORDER — PREDNISONE 20 MG PO TABS: ORAL_TABLET | ORAL | Status: DC

## 2012-06-01 MED ORDER — PREDNISONE 20 MG PO TABS
60.0000 mg | ORAL_TABLET | Freq: Once | ORAL | Status: AC
  Administered 2012-06-01: 60 mg via ORAL
  Filled 2012-06-01: qty 3

## 2012-06-01 MED ORDER — HYDROCODONE-ACETAMINOPHEN 5-325 MG PO TABS: 1.0000 | ORAL_TABLET | Freq: Four times a day (QID) | ORAL | Status: DC | PRN

## 2012-06-01 NOTE — ED Provider Notes (Signed)
History     CSN: [id number]  Arrival date & time 05/31/12  2330   First MD Initiated Contact with Patient 05/31/12 2358      Chief Complaint  Patient presents with  . Back Pain    (Consider location/radiation/quality/duration/timing/severity/associated sxs/prior treatment) HPI Comments: Patient states she bent over and when she stood up her back hurt and she could not stand straight took 2 ibuprofen with little relief   Patient is a 37 y.o. female presenting with back pain. The history is provided by the patient.  Back Pain  This is a new problem. The current episode started 3 to 5 hours ago. The problem occurs constantly. The problem has been gradually worsening. The pain is associated with twisting. The pain is present in the lumbar spine. The quality of the pain is described as aching. The pain radiates to the left thigh and right thigh. Pertinent negatives include no fever, no numbness, no dysuria and no weakness.    Past Medical History  Diagnosis Date  . Depression   . Ovarian failure   . Sleep apnea     Past Surgical History  Procedure Date  . Cholecystectomy 2010  . Cholecystectomy     Family History  Problem Relation Age of Onset  . Allergies Sister   . Heart disease Paternal Grandmother   . Cancer Paternal Grandmother     BREAST CANCER  . Cancer Paternal Grandfather     History  Substance Use Topics  . Smoking status: Former Smoker    Types: Cigarettes  . Smokeless tobacco: Never Used     Comment: Social smoker x 12-14 years. Still smokes on and off currently  . Alcohol Use: Yes     Comment: occasionally    OB History    Grav Para Term Preterm Abortions TAB SAB Ect Mult Living                  Review of Systems  Constitutional: Negative for fever and chills.  HENT: Negative.   Gastrointestinal: Negative.  Negative for nausea, vomiting and diarrhea.  Genitourinary: Negative for dysuria, flank pain and difficulty urinating.  Musculoskeletal:  Positive for back pain.  Skin: Negative for wound.  Neurological: Negative for dizziness, weakness and numbness.    Allergies  Sulfa antibiotics  Home Medications   Current Outpatient Rx  Name  Route  Sig  Dispense  Refill  . CETIRIZINE HCL 10 MG PO TABS   Oral   Take 10 mg by mouth daily.         Marland Kitchen VITAMIN D3 3000 UNITS PO TABS   Oral   Take 1 tablet by mouth daily.         Marland Kitchen VITAMIN B 12 PO   Oral   Take 1 tablet by mouth daily.         . IBUPROFEN 200 MG PO TABS   Oral   Take 400 mg by mouth every 6 (six) hours as needed. Back pain         . NORGESTIMATE-ETH ESTRADIOL 0.25-35 MG-MCG PO TABS   Oral   Take 1 tablet by mouth daily.         . SERTRALINE HCL 50 MG PO TABS   Oral   Take 50 mg by mouth daily.         Marland Kitchen DIAZEPAM 5 MG PO TABS   Oral   Take 1 tablet (5 mg total) by mouth 2 (two) times daily.   10 tablet  0   . HYDROCODONE-ACETAMINOPHEN 5-325 MG PO TABS   Oral   Take 1 tablet by mouth every 6 (six) hours as needed for pain.   20 tablet   0   . PREDNISONE 20 MG PO TABS      3 Tabs PO Days 1-3, then 2 tabs PO Days 4-6, then 1 tab PO Day 7-9, then Half Tab PO Day 10-12   20 tablet   0     BP 122/62  Pulse 79  Temp 98 F (36.7 C) (Oral)  Resp 18  Ht 5\' 9"  (1.753 m)  Wt 297 lb (134.718 kg)  BMI 43.86 kg/m2  SpO2 100%  LMP 04/23/2012  Physical Exam  Constitutional: She appears well-developed and well-nourished.  HENT:  Head: Normocephalic.  Neck: Normal range of motion.  Cardiovascular: Normal rate.   Pulmonary/Chest: Effort normal.  Abdominal: Soft.  Musculoskeletal: Normal range of motion. She exhibits no edema.       Arms: Neurological: She is alert.  Skin: Skin is warm and dry. No rash noted. No erythema.    ED Course  Procedures (including critical care time)  Labs Reviewed - No data to display No results found.   1. Low back pain       MDM  Low back pain with treat with Prednisone, muscle relaxer and  pain control        Arman Filter, NP 06/01/12 0104

## 2012-06-01 NOTE — ED Provider Notes (Signed)
Medical screening examination/treatment/procedure(s) were performed by non-physician practitioner and as supervising physician I was immediately available for consultation/collaboration.  Tobin Chad, MD 06/01/12 [phone]

## 2012-06-03 DIAGNOSIS — J0100 Acute maxillary sinusitis, unspecified: Secondary | ICD-10-CM | POA: Insufficient documentation

## 2012-06-03 NOTE — Assessment & Plan Note (Signed)
Mask is being adjusted for comfort. We discussed the importance of compliance, sleep hygiene, weight control again.

## 2012-06-03 NOTE — Assessment & Plan Note (Signed)
Upper respiratory infection with sinusitis. Plan-saline rinse. Doxycycline. Sudafed.

## 2012-11-09 ENCOUNTER — Emergency Department (HOSPITAL_COMMUNITY): Payer: Self-pay

## 2012-11-09 ENCOUNTER — Encounter (HOSPITAL_COMMUNITY): Payer: Self-pay

## 2012-11-09 ENCOUNTER — Emergency Department (HOSPITAL_COMMUNITY)
Admission: EM | Admit: 2012-11-09 | Discharge: 2012-11-09 | Disposition: A | Discharge status: Home / Self Care | Payer: Self-pay | Attending: Emergency Medicine | Admitting: Emergency Medicine

## 2012-11-09 DIAGNOSIS — J3489 Other specified disorders of nose and nasal sinuses: Secondary | ICD-10-CM | POA: Insufficient documentation

## 2012-11-09 DIAGNOSIS — R110 Nausea: Secondary | ICD-10-CM | POA: Insufficient documentation

## 2012-11-09 DIAGNOSIS — Z79899 Other long term (current) drug therapy: Secondary | ICD-10-CM | POA: Insufficient documentation

## 2012-11-09 DIAGNOSIS — F329 Major depressive disorder, single episode, unspecified: Secondary | ICD-10-CM | POA: Insufficient documentation

## 2012-11-09 DIAGNOSIS — F3289 Other specified depressive episodes: Secondary | ICD-10-CM | POA: Insufficient documentation

## 2012-11-09 DIAGNOSIS — R52 Pain, unspecified: Secondary | ICD-10-CM | POA: Insufficient documentation

## 2012-11-09 DIAGNOSIS — R0602 Shortness of breath: Secondary | ICD-10-CM | POA: Insufficient documentation

## 2012-11-09 DIAGNOSIS — R42 Dizziness and giddiness: Secondary | ICD-10-CM | POA: Insufficient documentation

## 2012-11-09 DIAGNOSIS — R51 Headache: Secondary | ICD-10-CM | POA: Insufficient documentation

## 2012-11-09 DIAGNOSIS — F411 Generalized anxiety disorder: Secondary | ICD-10-CM | POA: Insufficient documentation

## 2012-11-09 DIAGNOSIS — Z8639 Personal history of other endocrine, nutritional and metabolic disease: Secondary | ICD-10-CM | POA: Insufficient documentation

## 2012-11-09 DIAGNOSIS — Z87891 Personal history of nicotine dependence: Secondary | ICD-10-CM | POA: Insufficient documentation

## 2012-11-09 DIAGNOSIS — Z8669 Personal history of other diseases of the nervous system and sense organs: Secondary | ICD-10-CM | POA: Insufficient documentation

## 2012-11-09 DIAGNOSIS — J069 Acute upper respiratory infection, unspecified: Secondary | ICD-10-CM

## 2012-11-09 DIAGNOSIS — R0789 Other chest pain: Secondary | ICD-10-CM | POA: Insufficient documentation

## 2012-11-09 DIAGNOSIS — R5381 Other malaise: Secondary | ICD-10-CM | POA: Insufficient documentation

## 2012-11-09 DIAGNOSIS — Z862 Personal history of diseases of the blood and blood-forming organs and certain disorders involving the immune mechanism: Secondary | ICD-10-CM | POA: Insufficient documentation

## 2012-11-09 HISTORY — DX: Anxiety disorder, unspecified: F41.9

## 2012-11-09 MED ORDER — GUAIFENESIN ER 600 MG PO TB12: 1200.0000 mg | ORAL_TABLET | Freq: Two times a day (BID) | ORAL | Status: DC

## 2012-11-09 MED ORDER — IPRATROPIUM BROMIDE 0.03 % NA SOLN: 2.0000 | Freq: Two times a day (BID) | NASAL | Status: DC

## 2012-11-09 MED ORDER — ALBUTEROL SULFATE (5 MG/ML) 0.5% IN NEBU
5.0000 mg | INHALATION_SOLUTION | Freq: Once | RESPIRATORY_TRACT | Status: AC
  Administered 2012-11-09: 5 mg via RESPIRATORY_TRACT
  Filled 2012-11-09: qty 1

## 2012-11-09 MED ORDER — ALBUTEROL SULFATE HFA 108 (90 BASE) MCG/ACT IN AERS
2.0000 | INHALATION_SPRAY | RESPIRATORY_TRACT | Status: DC | PRN
  Administered 2012-11-09: 2 via RESPIRATORY_TRACT
  Filled 2012-11-09: qty 6.7

## 2012-11-09 NOTE — ED Notes (Signed)
Patient c/o sore throat, body aches, headache, sinus congestion and has had epistaxis x 2 in the past 2 weeks.

## 2012-11-09 NOTE — ED Provider Notes (Signed)
History  This chart was scribed for [employer], PA-C, working with Lyanne Co, MD by Ardelia Mems, ED Scribe. This patient was seen in room WTR9/WTR9 and the patient's care was started at 6:13 PM.   CSN: [id number]  Arrival date & time 11/09/12  1649     Chief Complaint  Patient presents with  . Sore Throat  . Headache  . Generalized Body Aches     The history is provided by the patient. No language interpreter was used.    HPI Comments: Catherine Koch is a 37 y.o. female who presents to the Emergency Department complaining of 1 week of sinus congestion and sore throat with associated, intermittent fatigue, generalized body aches, nausea, and chest tightness. Pt also reports 2 episodes of epistaxis- 1 last week and 1 today, with associated light-headedness which resolved quickly and spontaneously. Pt states that she has taken Sudafed with minimal relief of nasal congestion. Pt reports intermittent SOB, while walking, which may be baseline for her. Pt denies hx of heart problems. Pt's last and worst sinus infection was last year. Pt has sleep apnea and is on a CPAP machine. Pt has seasonal allergies to pollen, dust, dander, etc. Pt denies fever, chills, diarrhea, cough or any other symptoms.  PCP Dr. Quitman Livings  Past Medical History  Diagnosis Date  . Depression   . Ovarian failure   . Sleep apnea   . Anxiety     Past Surgical History  Procedure Laterality Date  . Cholecystectomy  2010  . Cholecystectomy      Family History  Problem Relation Age of Onset  . Allergies Sister   . Heart disease Paternal Grandmother   . Cancer Paternal Grandmother     BREAST CANCER  . Cancer Paternal Grandfather     History  Substance Use Topics  . Smoking status: Former Smoker    Types: Cigarettes  . Smokeless tobacco: Never Used     Comment: Social smoker x 12-14 years. Still smokes on and off currently  . Alcohol Use: Yes     Comment: occasionally    OB History    Grav Para Term Preterm Abortions TAB SAB Ect Mult Living                  Review of Systems  Constitutional: Positive for fatigue. Negative for fever and chills.  HENT: Positive for nosebleeds, congestion and sore throat. Negative for neck pain and neck stiffness.   Respiratory: Positive for chest tightness and shortness of breath. Negative for cough.   Gastrointestinal: Positive for nausea. Negative for diarrhea.  Musculoskeletal: Positive for myalgias. Negative for back pain.  Skin: Negative for rash.  Neurological: Positive for light-headedness and headaches.   A complete 10 system review of systems was obtained and all systems are negative except as noted in the HPI and PMH.   Allergies  Sulfa antibiotics  Home Medications   Current Outpatient Rx  Name  Route  Sig  Dispense  Refill  . atomoxetine (STRATTERA) 25 MG capsule   Oral   Take 25 mg by mouth daily.         . cetirizine (ZYRTEC) 10 MG tablet   Oral   Take 10 mg by mouth daily.         . Cholecalciferol (VITAMIN D3) 3000 UNITS TABS   Oral   Take 1 tablet by mouth daily.         . diazepam (VALIUM) 5 MG tablet   Oral  Take 1 tablet (5 mg total) by mouth 2 (two) times daily.   10 tablet   0   . hydrOXYzine (VISTARIL) 50 MG capsule   Oral   Take 50 mg by mouth 3 (three) times daily as needed for itching or anxiety (anxiety).         Marland Kitchen ibuprofen (ADVIL,MOTRIN) 200 MG tablet   Oral   Take 400 mg by mouth every 6 (six) hours as needed. Back pain         . naproxen (NAPROSYN) 375 MG tablet   Oral   Take 375 mg by mouth 2 (two) times daily as needed (pain).         Marland Kitchen sertraline (ZOLOFT) 50 MG tablet   Oral   Take 50 mg by mouth daily.         Marland Kitchen guaiFENesin (MUCINEX) 600 MG 12 hr tablet   Oral   Take 2 tablets (1,200 mg total) by mouth 2 (two) times daily.   30 tablet   0   . ipratropium (ATROVENT) 0.03 % nasal spray   Nasal   Place 2 sprays into the nose 2 (two) times daily. PRN  congestion   30 mL   0     Triage Vitals: BP 134/82  Pulse 91  Temp(Src) 98.9 F (37.2 C) (Oral)  Resp 20  SpO2 94%  LMP 11/10/2011  Physical Exam  Constitutional: She is oriented to person, place, and time. She appears well-developed and well-nourished. No distress.  HENT:  Head: Normocephalic and atraumatic.  Right Ear: Tympanic membrane, external ear and ear canal normal.  Left Ear: Tympanic membrane, external ear and ear canal normal.  Nose: Mucosal edema and rhinorrhea present. No epistaxis. Right sinus exhibits no maxillary sinus tenderness and no frontal sinus tenderness. Left sinus exhibits no maxillary sinus tenderness and no frontal sinus tenderness.  Mouth/Throat: Uvula is midline and mucous membranes are normal. Mucous membranes are not pale and not cyanotic. No edematous. No oropharyngeal exudate, posterior oropharyngeal edema, posterior oropharyngeal erythema or tonsillar abscesses.  No epistaxis noted  Eyes: Conjunctivae are normal. Pupils are equal, round, and reactive to light.  Neck: Normal range of motion and full passive range of motion without pain. No spinous process tenderness and no muscular tenderness present. No rigidity. Normal range of motion present.  Cardiovascular: Normal rate, regular rhythm, S1 normal, S2 normal, normal heart sounds and intact distal pulses.   Pulses:      Radial pulses are 2+ on the right side, and 2+ on the left side.       Dorsalis pedis pulses are 2+ on the right side, and 2+ on the left side.       Posterior tibial pulses are 2+ on the right side, and 2+ on the left side.  Pulmonary/Chest: Effort normal. No accessory muscle usage or stridor. Not tachypneic. No respiratory distress. She has decreased breath sounds. She has no rhonchi. She has no rales. She exhibits no tenderness and no bony tenderness.  Abdominal: Soft. Bowel sounds are normal. She exhibits no distension. There is no tenderness.  Obese  Musculoskeletal: Normal  range of motion. She exhibits no tenderness.  Lymphadenopathy:    She has no cervical adenopathy.  Neurological: She is alert and oriented to person, place, and time. She exhibits normal muscle tone. Coordination normal.  Skin: Skin is warm and dry. No rash noted. She is not diaphoretic. No erythema.  Psychiatric: She has a normal mood and affect.  ED Course  Procedures (including critical care time)  DIAGNOSTIC STUDIES: Oxygen Saturation is 94% on RA, adequate by my interpretation.    COORDINATION OF CARE: 6:40 PM- Pt advised of plan for treatment and pt agrees.   Medications  albuterol (PROVENTIL HFA;VENTOLIN HFA) 108 (90 BASE) MCG/ACT inhaler 2 puff (not administered)  albuterol (PROVENTIL) (5 MG/ML) 0.5% nebulizer solution 5 mg (5 mg Nebulization Given 11/09/12 1850)     Labs Reviewed - No data to display Dg Chest 2 View  11/09/2012   *RADIOLOGY REPORT*  Clinical Data: Wheezing and  CHEST - 2 VIEW  Comparison: None.  Findings: Relatively low lung volumes. Lungs clear.  Heart size and pulmonary vascularity normal.  No effusion.  Visualized bones unremarkable.  IMPRESSION: No acute disease   Original Report Authenticated By: D. Andria Rhein, MD     1. Viral URI       MDM  Shakyia Posillico presents with viral URI symptoms.  Pt CXR negative for acute infiltrate.  I personally reviewed the imaging tests through PACS system.  I reviewed available ER/hospitalization records through the EMR.  Pt lung sounds improved after albuterol treatment, chest pressure resolved and pt states she feels better.  Patients symptoms are consistent with URI, likely viral etiology. Discussed that antibiotics are not indicated for viral infections. Pt will be discharged with symptomatic treatment.  Verbalizes understanding and is agreeable with plan. Pt is hemodynamically stable & in NAD prior to dc.  I have also discussed reasons to return immediately to the ER.  Patient expresses understanding and  agrees with plan.  I personally performed the services described in this documentation, which was scribed in my presence. The recorded information has been reviewed and is accurate.    Dierdre Forth, PA-C 11/09/12 1949  Khamari Yousuf, PA-C 11/09/12 1950

## 2012-11-09 NOTE — ED Provider Notes (Signed)
Medical screening examination/treatment/procedure(s) were performed by non-physician practitioner and as supervising physician I was immediately available for consultation/collaboration.  Lyanne Co, MD 11/09/12 2002

## 2012-11-21 ENCOUNTER — Ambulatory Visit (INDEPENDENT_AMBULATORY_CARE_PROVIDER_SITE_OTHER): Payer: Self-pay | Admitting: Internal Medicine

## 2012-11-21 ENCOUNTER — Encounter: Payer: Self-pay | Admitting: Internal Medicine

## 2012-11-21 VITALS — BP 130/78 | HR 96 | Ht 69.0 in | Wt 303.6 lb

## 2012-11-21 DIAGNOSIS — G4733 Obstructive sleep apnea (adult) (pediatric): Secondary | ICD-10-CM
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
## 2012-11-21 NOTE — Patient Instructions (Addendum)
We  Can continue CPAP 10/ Advanced  Please call as needed

## 2012-12-06 ENCOUNTER — Encounter: Payer: Self-pay | Admitting: Internal Medicine

## 2012-12-06 NOTE — Assessment & Plan Note (Signed)
Good initial experience with CPAP. More restorative sleep.

## 2013-01-06 ENCOUNTER — Emergency Department (HOSPITAL_COMMUNITY)
Admission: EM | Admit: 2013-01-06 | Discharge: 2013-01-06 | Disposition: A | Discharge status: Home / Self Care | Payer: Self-pay | Attending: Emergency Medicine | Admitting: Emergency Medicine

## 2013-01-06 ENCOUNTER — Encounter (HOSPITAL_COMMUNITY): Payer: Self-pay | Admitting: Emergency Medicine

## 2013-01-06 DIAGNOSIS — Z8639 Personal history of other endocrine, nutritional and metabolic disease: Secondary | ICD-10-CM | POA: Insufficient documentation

## 2013-01-06 DIAGNOSIS — R0982 Postnasal drip: Secondary | ICD-10-CM | POA: Insufficient documentation

## 2013-01-06 DIAGNOSIS — F411 Generalized anxiety disorder: Secondary | ICD-10-CM | POA: Insufficient documentation

## 2013-01-06 DIAGNOSIS — R059 Cough, unspecified: Secondary | ICD-10-CM | POA: Insufficient documentation

## 2013-01-06 DIAGNOSIS — Z79899 Other long term (current) drug therapy: Secondary | ICD-10-CM | POA: Insufficient documentation

## 2013-01-06 DIAGNOSIS — Z87891 Personal history of nicotine dependence: Secondary | ICD-10-CM | POA: Insufficient documentation

## 2013-01-06 DIAGNOSIS — R5381 Other malaise: Secondary | ICD-10-CM | POA: Insufficient documentation

## 2013-01-06 DIAGNOSIS — R05 Cough: Secondary | ICD-10-CM | POA: Insufficient documentation

## 2013-01-06 DIAGNOSIS — Z862 Personal history of diseases of the blood and blood-forming organs and certain disorders involving the immune mechanism: Secondary | ICD-10-CM | POA: Insufficient documentation

## 2013-01-06 DIAGNOSIS — J329 Chronic sinusitis, unspecified: Secondary | ICD-10-CM | POA: Insufficient documentation

## 2013-01-06 DIAGNOSIS — J3489 Other specified disorders of nose and nasal sinuses: Secondary | ICD-10-CM | POA: Insufficient documentation

## 2013-01-06 DIAGNOSIS — F329 Major depressive disorder, single episode, unspecified: Secondary | ICD-10-CM | POA: Insufficient documentation

## 2013-01-06 DIAGNOSIS — F3289 Other specified depressive episodes: Secondary | ICD-10-CM | POA: Insufficient documentation

## 2013-01-06 MED ORDER — AMOXICILLIN 500 MG PO CAPS: 500.0000 mg | ORAL_CAPSULE | Freq: Three times a day (TID) | ORAL | Status: DC

## 2013-01-06 NOTE — ED Notes (Addendum)
Pt c/o cough, low grade fever, generalized body aches, and nasal congestion x 10 days.

## 2013-01-06 NOTE — ED Provider Notes (Signed)
CSN: [id number]     Arrival date & time 01/06/13  1134 History     First MD Initiated Contact with Patient 01/06/13 1201     Chief Complaint  Patient presents with  . Fever  . Nasal Congestion   (Consider location/radiation/quality/duration/timing/severity/associated sxs/prior Treatment) Patient is a 37 y.o. female presenting with fever. The history is provided by the patient and medical records.  Fever Associated symptoms: congestion    Patient presents to the ED for productive cough with yellow sputum, low-grade fever, generalized body aches, and nasal congestion x2 weeks. Patient states she feels very fatigued, with low energy and has been sleeping a lot.  No recent sick contacts at home. Patient states she has tried numerous over-the-counter medications including Mucinex, Robitussin, and cold medications without significant improvement.  Pt is supposed to start a new job is 3 days and is concerned that she will not be well in time.  No abdominal pain, N/V/D.  No chest pain or SOB.  Past Medical History  Diagnosis Date  . Depression   . Ovarian failure   . Sleep apnea   . Anxiety    Past Surgical History  Procedure Laterality Date  . Cholecystectomy  2010  . Cholecystectomy     Family History  Problem Relation Age of Onset  . Allergies Sister   . Heart disease Paternal Grandmother   . Cancer Paternal Grandmother     BREAST CANCER  . Cancer Paternal Grandfather    History  Substance Use Topics  . Smoking status: Former Smoker    Types: Cigarettes  . Smokeless tobacco: Never Used     Comment: Social smoker x 12-14 years. Still smokes on and off currently  . Alcohol Use: Yes     Comment: occasionally   OB History   Grav Para Term Preterm Abortions TAB SAB Ect Mult Living                 Review of Systems  Constitutional: Positive for fever.  HENT: Positive for congestion and postnasal drip.   All other systems reviewed and are negative.    Allergies  Sulfa  antibiotics  Home Medications   Current Outpatient Rx  Name  Route  Sig  Dispense  Refill  . cetirizine (ZYRTEC) 10 MG tablet   Oral   Take 10 mg by mouth daily.         . ferrous fumarate (HEMOCYTE - 106 MG FE) 325 (106 FE) MG TABS   Oral   Take 1 tablet by mouth 2 (two) times daily.         Marland Kitchen guaiFENesin (MUCINEX) 600 MG 12 hr tablet   Oral   Take 2 tablets (1,200 mg total) by mouth 2 (two) times daily.   30 tablet   0   . ibuprofen (ADVIL,MOTRIN) 200 MG tablet   Oral   Take 400 mg by mouth every 6 (six) hours as needed. Back pain         . Levonorgest-Eth Estrad 91-Day (SEASONIQUE PO)   Oral   Take 1 tablet by mouth daily.         . Multiple Vitamin (MULTIVITAMIN WITH MINERALS) TABS tablet   Oral   Take 1 tablet by mouth daily.         . sertraline (ZOLOFT) 100 MG tablet   Oral   Take 100 mg by mouth daily.          BP 125/82  Pulse 76  Temp(Src) 99.7  F (37.6 C)  Resp 20  SpO2 97%  Physical Exam  Nursing note and vitals reviewed. Constitutional: She is oriented to person, place, and time. She appears well-developed and well-nourished. No distress.  HENT:  Head: Normocephalic and atraumatic.  Right Ear: Tympanic membrane and ear canal normal.  Left Ear: Tympanic membrane and ear canal normal.  Nose: Mucosal edema present. Right sinus exhibits maxillary sinus tenderness. Right sinus exhibits no frontal sinus tenderness. Left sinus exhibits maxillary sinus tenderness. Left sinus exhibits no frontal sinus tenderness.  Mouth/Throat: Uvula is midline, oropharynx is clear and moist and mucous membranes are normal. No oropharyngeal exudate, posterior oropharyngeal edema, posterior oropharyngeal erythema or tonsillar abscesses.  PND in oropharynx, tonsils normal in size and appearance bilaterally, maxillary sinus tenderness  Eyes: Conjunctivae and EOM are normal. Pupils are equal, round, and reactive to light.  Neck: Normal range of motion. Neck supple.   Cardiovascular: Normal rate, regular rhythm and normal heart sounds.   Pulmonary/Chest: Effort normal and breath sounds normal. Not tachypneic. No respiratory distress. She has no wheezes. She has no rhonchi.  Musculoskeletal: Normal range of motion.  Neurological: She is alert and oriented to person, place, and time.  Skin: Skin is warm and dry. She is not diaphoretic.  Psychiatric: She has a normal mood and affect.    ED Course   Procedures (including critical care time)  Labs Reviewed - No data to display No results found. 1. Sinusitis     MDM   Signs/sx consistent with acute sinusitis.  Lungs CTAB, doubt CAP or bronchtitis.  Rx amoxicillin.  Instructed to take OTC cough syrup as needed.  Follow up with PCP if sx not improving in the next few days.  Discussed plan with pt, she agreed.  Return precautions advised.  Garlon Hatchet, PA-C 01/06/13 1316

## 2013-01-06 NOTE — ED Provider Notes (Signed)
Medical screening examination/treatment/procedure(s) were performed by non-physician practitioner and as supervising physician I was immediately available for consultation/collaboration.  Sunjai Levandoski N Izrael Peak, DO 01/06/13 1532 

## 2013-01-11 ENCOUNTER — Emergency Department (HOSPITAL_COMMUNITY)
Admission: EM | Admit: 2013-01-11 | Discharge: 2013-01-11 | Disposition: A | Discharge status: Home / Self Care | Payer: Self-pay | Attending: Emergency Medicine | Admitting: Emergency Medicine

## 2013-01-11 ENCOUNTER — Encounter (HOSPITAL_COMMUNITY): Payer: Self-pay | Admitting: *Deleted

## 2013-01-11 DIAGNOSIS — F3289 Other specified depressive episodes: Secondary | ICD-10-CM | POA: Insufficient documentation

## 2013-01-11 DIAGNOSIS — Y929 Unspecified place or not applicable: Secondary | ICD-10-CM | POA: Insufficient documentation

## 2013-01-11 DIAGNOSIS — M129 Arthropathy, unspecified: Secondary | ICD-10-CM | POA: Insufficient documentation

## 2013-01-11 DIAGNOSIS — M25561 Pain in right knee: Secondary | ICD-10-CM

## 2013-01-11 DIAGNOSIS — F329 Major depressive disorder, single episode, unspecified: Secondary | ICD-10-CM | POA: Insufficient documentation

## 2013-01-11 DIAGNOSIS — M2140 Flat foot [pes planus] (acquired), unspecified foot: Secondary | ICD-10-CM | POA: Insufficient documentation

## 2013-01-11 DIAGNOSIS — J019 Acute sinusitis, unspecified: Secondary | ICD-10-CM | POA: Insufficient documentation

## 2013-01-11 DIAGNOSIS — X503XXA Overexertion from repetitive movements, initial encounter: Secondary | ICD-10-CM | POA: Insufficient documentation

## 2013-01-11 DIAGNOSIS — Y9389 Activity, other specified: Secondary | ICD-10-CM | POA: Insufficient documentation

## 2013-01-11 DIAGNOSIS — Z79899 Other long term (current) drug therapy: Secondary | ICD-10-CM | POA: Insufficient documentation

## 2013-01-11 DIAGNOSIS — R209 Unspecified disturbances of skin sensation: Secondary | ICD-10-CM | POA: Insufficient documentation

## 2013-01-11 DIAGNOSIS — F411 Generalized anxiety disorder: Secondary | ICD-10-CM | POA: Insufficient documentation

## 2013-01-11 DIAGNOSIS — M25569 Pain in unspecified knee: Secondary | ICD-10-CM | POA: Insufficient documentation

## 2013-01-11 DIAGNOSIS — Z87891 Personal history of nicotine dependence: Secondary | ICD-10-CM | POA: Insufficient documentation

## 2013-01-11 DIAGNOSIS — M722 Plantar fascial fibromatosis: Secondary | ICD-10-CM | POA: Insufficient documentation

## 2013-01-11 MED ORDER — NAPROXEN 500 MG PO TABS: 500.0000 mg | ORAL_TABLET | Freq: Two times a day (BID) | ORAL | Status: DC

## 2013-01-11 NOTE — ED Notes (Signed)
Pt states started having bil foot pain shooting up to knees since Monday, states she started a new job on Monday, pt states has a hx of arthritis.

## 2013-01-11 NOTE — ED Provider Notes (Signed)
CSN: [id number]     Arrival date & time 01/11/13  1821 History    This chart was scribed for Magnus Sinning, PA, working with Gerhard Munch, MD by Blanchard Kelch, ED Scribe. This patient was seen in room WTR6/WTR6 and the patient's care was started at 7:29 PM.    Chief Complaint  Patient presents with  . Foot Pain  . Knee Pain    Patient is a 37 y.o. female presenting with lower extremity pain. The history is provided by the patient. No language interpreter was used.  Foot Pain The current episode started more than 2 days ago. The problem occurs constantly. The problem has been gradually worsening. Pertinent negatives include no chest pain, no abdominal pain, no headaches and no shortness of breath. The symptoms are aggravated by walking and standing. The symptoms are relieved by lying down and rest.    HPI Comments: Catherine Koch is a 37 y.o. female who presents to the Emergency Department complaining of worsening, constant bilateral foot pain that began 3 days ago and radiates up to knee and lower back.  Patient complains of associated waxing and waning tingling in feet and toes and swelling in right knee. Patient has a history of flat feet, arthritis in knees, and sciatica pain. Patient states her pain is usually improved with rest. Patient recently started new job that does not give her breaks and causes her to be on her feet constantly, which she suspects is why the pain began. Patient got new shoes to help with flat footed issue that temporarily relieved pain but is currently ineffective. Patient denies any numbness in her feet. She also reports a current sinus infection.    Past Medical History  Diagnosis Date  . Depression   . Ovarian failure   . Sleep apnea   . Anxiety    Past Surgical History  Procedure Laterality Date  . Cholecystectomy  2010  . Cholecystectomy     Family History  Problem Relation Age of Onset  . Allergies Sister   . Heart disease Paternal  Grandmother   . Cancer Paternal Grandmother     BREAST CANCER  . Cancer Paternal Grandfather    History  Substance Use Topics  . Smoking status: Former Smoker    Types: Cigarettes  . Smokeless tobacco: Never Used     Comment: Social smoker x 12-14 years. Still smokes on and off currently  . Alcohol Use: Yes     Comment: occasionally   OB History   Grav Para Term Preterm Abortions TAB SAB Ect Mult Living                 Review of Systems  HENT:       Sinus infection  Respiratory: Negative for shortness of breath.   Cardiovascular: Negative for chest pain.  Gastrointestinal: Negative for abdominal pain.  Neurological: Negative for numbness and headaches.       Tingling in right foot.  All other systems reviewed and are negative.    Allergies  Sulfa antibiotics  Home Medications   Current Outpatient Rx  Name  Route  Sig  Dispense  Refill  . amoxicillin (AMOXIL) 500 MG capsule   Oral   Take 1 capsule (500 mg total) by mouth 3 (three) times daily.   30 capsule   0   . cetirizine (ZYRTEC) 10 MG tablet   Oral   Take 10 mg by mouth daily.         . ferrous  fumarate (HEMOCYTE - 106 MG FE) 325 (106 FE) MG TABS   Oral   Take 1 tablet by mouth 2 (two) times daily.         Marland Kitchen guaiFENesin (MUCINEX) 600 MG 12 hr tablet   Oral   Take 2 tablets (1,200 mg total) by mouth 2 (two) times daily.   30 tablet   0   . ibuprofen (ADVIL,MOTRIN) 200 MG tablet   Oral   Take 400 mg by mouth every 6 (six) hours as needed. Back pain         . Levonorgest-Eth Estrad 91-Day (SEASONIQUE PO)   Oral   Take 1 tablet by mouth daily.         . Multiple Vitamin (MULTIVITAMIN WITH MINERALS) TABS tablet   Oral   Take 1 tablet by mouth daily.         . sertraline (ZOLOFT) 100 MG tablet   Oral   Take 100 mg by mouth daily.          Triage Vitals: BP 120/79  Pulse 84  Temp(Src) 99 F (37.2 C) (Oral)  Resp 18  Ht 5\' 9"  (1.753 m)  Wt 300 lb (136.079 kg)  BMI 44.28 kg/m2   SpO2 99%  Physical Exam  Nursing note and vitals reviewed. Constitutional: She is oriented to person, place, and time. She appears well-developed and well-nourished.  Morbidly obese.  HENT:  Head: Normocephalic and atraumatic.  Eyes: Conjunctivae and EOM are normal.  Cardiovascular: Normal rate and regular rhythm.   2+ DP pulse  Pulmonary/Chest: Effort normal and breath sounds normal. No respiratory distress.  Musculoskeletal:  No pitting edema. Sensation intact in both feet. No erythema, obvious edema, or warmth in feet, ankles, or knees. Crepitus bilaterally above knees.   Neurological: She is oriented to person, place, and time. No sensory deficit.  Skin: Skin is warm and dry.  Psychiatric: She has a normal mood and affect.    ED Course  DIAGNOSTIC STUDIES:  Oxygen Saturation is 99% on room air, normal by my interpretation.    COORDINATION OF CARE:  7:41 PM -Clinical suspicion of plantar fascitis. Discussed treatment with icing, anti-inflammatories, elevation and supporting shoes. Patient verbalizes understanding and agrees with treatment plan.   Procedures (including critical care time)  Labs Reviewed - No data to display No results found. No diagnosis found.  MDM  Patient who is morbidly obese presents today with bilateral knee pain and foot pain.  She reports that she started a new job that requires a lot of standing, which she thinks worsened her pain.  No acute injury or trauma.  No signs of infection at this time.  Feel that the patient is stable for discharge.  I personally performed the services described in this documentation, which was scribed in my presence. The recorded information has been reviewed and is accurate.   Pascal Lux [place], PA-C 01/12/13 [phone]

## 2013-01-12 NOTE — ED Provider Notes (Signed)
  Medical screening examination/treatment/procedure(s) were performed by non-physician practitioner and as supervising physician I was immediately available for consultation/collaboration.    Gerhard Munch, MD 01/12/13 [phone]

## 2013-02-06 ENCOUNTER — Encounter (HOSPITAL_COMMUNITY): Payer: Self-pay | Admitting: Emergency Medicine

## 2013-02-06 ENCOUNTER — Emergency Department (HOSPITAL_COMMUNITY)
Admission: EM | Admit: 2013-02-06 | Discharge: 2013-02-07 | Disposition: A | Discharge status: Home / Self Care | Payer: Self-pay | Attending: Emergency Medicine | Admitting: Emergency Medicine

## 2013-02-06 DIAGNOSIS — Z791 Long term (current) use of non-steroidal anti-inflammatories (NSAID): Secondary | ICD-10-CM | POA: Insufficient documentation

## 2013-02-06 DIAGNOSIS — R509 Fever, unspecified: Secondary | ICD-10-CM | POA: Insufficient documentation

## 2013-02-06 DIAGNOSIS — Z8742 Personal history of other diseases of the female genital tract: Secondary | ICD-10-CM | POA: Insufficient documentation

## 2013-02-06 DIAGNOSIS — R142 Eructation: Secondary | ICD-10-CM | POA: Insufficient documentation

## 2013-02-06 DIAGNOSIS — F3289 Other specified depressive episodes: Secondary | ICD-10-CM | POA: Insufficient documentation

## 2013-02-06 DIAGNOSIS — Z8719 Personal history of other diseases of the digestive system: Secondary | ICD-10-CM | POA: Insufficient documentation

## 2013-02-06 DIAGNOSIS — Z8669 Personal history of other diseases of the nervous system and sense organs: Secondary | ICD-10-CM | POA: Insufficient documentation

## 2013-02-06 DIAGNOSIS — R112 Nausea with vomiting, unspecified: Secondary | ICD-10-CM | POA: Insufficient documentation

## 2013-02-06 DIAGNOSIS — Z79899 Other long term (current) drug therapy: Secondary | ICD-10-CM | POA: Insufficient documentation

## 2013-02-06 DIAGNOSIS — R52 Pain, unspecified: Secondary | ICD-10-CM | POA: Insufficient documentation

## 2013-02-06 DIAGNOSIS — R51 Headache: Secondary | ICD-10-CM | POA: Insufficient documentation

## 2013-02-06 DIAGNOSIS — N390 Urinary tract infection, site not specified: Secondary | ICD-10-CM | POA: Insufficient documentation

## 2013-02-06 DIAGNOSIS — R141 Gas pain: Secondary | ICD-10-CM | POA: Insufficient documentation

## 2013-02-06 DIAGNOSIS — F329 Major depressive disorder, single episode, unspecified: Secondary | ICD-10-CM | POA: Insufficient documentation

## 2013-02-06 DIAGNOSIS — Z9089 Acquired absence of other organs: Secondary | ICD-10-CM | POA: Insufficient documentation

## 2013-02-06 DIAGNOSIS — R42 Dizziness and giddiness: Secondary | ICD-10-CM | POA: Insufficient documentation

## 2013-02-06 DIAGNOSIS — F411 Generalized anxiety disorder: Secondary | ICD-10-CM | POA: Insufficient documentation

## 2013-02-06 DIAGNOSIS — Z87891 Personal history of nicotine dependence: Secondary | ICD-10-CM | POA: Insufficient documentation

## 2013-02-06 DIAGNOSIS — Z3202 Encounter for pregnancy test, result negative: Secondary | ICD-10-CM | POA: Insufficient documentation

## 2013-02-06 HISTORY — DX: Urinary tract infection, site not specified: N39.0

## 2013-02-06 LAB — CBC
MCV: 89.1 fL (ref 78.0–100.0)
Platelets: 327 10*3/uL (ref 150–400)
RDW: 13.7 % (ref 11.5–15.5)
WBC: 9.3 10*3/uL (ref 4.0–10.5)

## 2013-02-06 LAB — URINALYSIS, ROUTINE W REFLEX MICROSCOPIC
Glucose, UA: NEGATIVE mg/dL
Hgb urine dipstick: NEGATIVE
Specific Gravity, Urine: 1.023 (ref 1.005–1.030)

## 2013-02-06 LAB — URINE MICROSCOPIC-ADD ON

## 2013-02-06 LAB — POCT PREGNANCY, URINE: Preg Test, Ur: NEGATIVE

## 2013-02-06 NOTE — ED Notes (Signed)
Pt states she has L side lower back pain and tenderness to lower abdomen. Pt c/o nausea, headache, and feeling tired. Pt a/o x 4. No acute distress. Skin warm and dry.

## 2013-02-06 NOTE — ED Notes (Signed)
Pt ambulatory to [facility] with steady gait. Pt arrives with companion.

## 2013-02-06 NOTE — ED Notes (Signed)
Pt reports pain to abd, flank and head.  Pt reports increased pain after eating.  Pt reports episodes of dizziness and vomiting.  Hx of constipation and has had her gallbladder removed.

## 2013-02-07 ENCOUNTER — Emergency Department (HOSPITAL_COMMUNITY): Payer: Self-pay

## 2013-02-07 LAB — COMPREHENSIVE METABOLIC PANEL
ALT: 24 U/L (ref 0–35)
AST: 21 U/L (ref 0–37)
Albumin: 3.5 g/dL (ref 3.5–5.2)
Chloride: 100 mEq/L (ref 96–112)
Creatinine, Ser: 0.74 mg/dL (ref 0.50–1.10)
Sodium: 134 mEq/L — ABNORMAL LOW (ref 135–145)
Total Bilirubin: 0.2 mg/dL — ABNORMAL LOW (ref 0.3–1.2)

## 2013-02-07 MED ORDER — CEPHALEXIN 500 MG PO CAPS
500.0000 mg | ORAL_CAPSULE | Freq: Once | ORAL | Status: AC
  Administered 2013-02-07: 500 mg via ORAL
  Filled 2013-02-07: qty 1

## 2013-02-07 MED ORDER — CEPHALEXIN 500 MG PO CAPS: 500.0000 mg | ORAL_CAPSULE | Freq: Four times a day (QID) | ORAL | Status: DC

## 2013-02-07 NOTE — ED Provider Notes (Signed)
CSN: [id number]     Arrival date & time 02/06/13  2109 History   First MD Initiated Contact with Patient 02/06/13 2315     Chief Complaint  Patient presents with  . Generalized Body Aches  . Urinary Frequency   (Consider location/radiation/quality/duration/timing/severity/associated sxs/prior Treatment) HPI Catherine Koch is a 37 y.o. female who presents to emergency department with complaint of abdominal bloating, left flank pain, headache, low-grade fever, nausea, vomiting. Patient states that her symptoms began a week ago. It has been worsening since. Patient states that her symptoms are worse after she eats, states that she'll bloated. Patient states that she does have history of constipation and states she feels gassy. Patient states that she has also had urinary frequency and urgency. Patient is going to the bathroom almost every hour. States fevers up to 99.6 at home. Patient has been taking ibuprofen for her pain with no improvement. Patient admits to having some nausea and one episode of vomiting today. Patient denies any vaginal discharge or bleeding. Patient denies being pregnant.  Past Medical History  Diagnosis Date  . Depression   . Ovarian failure   . Sleep apnea   . Anxiety   . UTI (lower urinary tract infection)    Past Surgical History  Procedure Laterality Date  . Cholecystectomy  2010  . Cholecystectomy    . Wisdom tooth extraction     Family History  Problem Relation Age of Onset  . Allergies Sister   . Heart disease Paternal Grandmother   . Cancer Paternal Grandmother     BREAST CANCER  . Cancer Paternal Grandfather    History  Substance Use Topics  . Smoking status: Former Smoker    Types: Cigarettes  . Smokeless tobacco: Never Used     Comment: Social smoker x 12-14 years. Still smokes on and off currently  . Alcohol Use: Yes     Comment: occasionally   OB History   Grav Para Term Preterm Abortions TAB SAB Ect Mult Living                  Review of Systems  Constitutional: Positive for fever and chills.  HENT: Negative for neck pain and neck stiffness.   Respiratory: Negative for cough, chest tightness and shortness of breath.   Cardiovascular: Negative for chest pain, palpitations and leg swelling.  Gastrointestinal: Positive for nausea, vomiting and abdominal pain. Negative for diarrhea.  Genitourinary: Positive for dysuria, urgency and frequency. Negative for flank pain, vaginal bleeding, vaginal discharge, vaginal pain and pelvic pain.  Musculoskeletal: Negative for myalgias and arthralgias.  Skin: Negative for rash.  Neurological: Positive for light-headedness and headaches. Negative for dizziness, syncope and weakness.  All other systems reviewed and are negative.    Allergies  Sulfa antibiotics  Home Medications   Current Outpatient Rx  Name  Route  Sig  Dispense  Refill  . cetirizine (ZYRTEC) 10 MG tablet   Oral   Take 10 mg by mouth daily.         . hydrOXYzine (ATARAX/VISTARIL) 10 MG tablet   Oral   Take 10 mg by mouth at bedtime.         Marland Kitchen ibuprofen (ADVIL,MOTRIN) 200 MG tablet   Oral   Take 400 mg by mouth every 6 (six) hours as needed. Back pain         . Levonorgest-Eth Estrad 91-Day (SEASONIQUE PO)   Oral   Take 1 tablet by mouth daily.         Marland Kitchen  Multiple Vitamin (MULTIVITAMIN WITH MINERALS) TABS tablet   Oral   Take 1 tablet by mouth daily.         . naproxen (NAPROSYN) 500 MG tablet   Oral   Take 1 tablet (500 mg total) by mouth 2 (two) times daily.   30 tablet   0   . sertraline (ZOLOFT) 100 MG tablet   Oral   Take 100 mg by mouth daily.          BP 149/88  Pulse 83  Temp(Src) 99.6 F (37.6 C) (Oral)  Resp 18  Ht 5\' 9"  (1.753 m)  Wt 300 lb (136.079 kg)  BMI 44.28 kg/m2  SpO2 99%  LMP 02/07/2012 Physical Exam  Nursing note and vitals reviewed. Constitutional: She is oriented to person, place, and time. She appears well-developed and well-nourished. No  distress.  HENT:  Head: Normocephalic.  Eyes: Conjunctivae are normal.  Neck: Neck supple.  Cardiovascular: Normal rate, regular rhythm and normal heart sounds.   Pulmonary/Chest: Effort normal and breath sounds normal. No respiratory distress. She has no wheezes. She has no rales.  Abdominal: Soft. Bowel sounds are normal. She exhibits no distension. There is tenderness. There is no rebound and no guarding.  Diffuse abdominal tenderness. No CVA tenderness.  Musculoskeletal: She exhibits no edema.  Neurological: She is alert and oriented to person, place, and time.  Skin: Skin is warm and dry.  Psychiatric: She has a normal mood and affect. Her behavior is normal.    ED Course  Procedures (including critical care time) Labs Review Labs Reviewed  COMPREHENSIVE METABOLIC PANEL - Abnormal; Notable for the following:    Sodium 134 (*)    Glucose, Bld 108 (*)    Total Bilirubin 0.2 (*)    All other components within normal limits  URINALYSIS, ROUTINE W REFLEX MICROSCOPIC - Abnormal; Notable for the following:    APPearance CLOUDY (*)    Leukocytes, UA MODERATE (*)    All other components within normal limits  URINE MICROSCOPIC-ADD ON - Abnormal; Notable for the following:    Squamous Epithelial / LPF FEW (*)    All other components within normal limits  CBC  POCT PREGNANCY, URINE   Imaging Review Dg Abd 2 Views  02/07/2013   *RADIOLOGY REPORT*  Clinical Data: Abdominal pain and nausea.  ABDOMEN - 2 VIEW  Comparison: None available at time of study interpretation.  Findings: Bowel gas pattern is non dilated and nonobstructive.  No intraperitoneal free air or mass effect.  No pathologic calcifications.  Surgical clips in the right upper quadrant likely reflect cholecystectomy.  Soft tissue planes and included osseous structures are not suspicious.  3 mm linear calcification projecting medial to the right femoral head may be within the overlying subcutaneous fat.  IMPRESSION: Nonspecific  bowel gas pattern.   Original Report Authenticated By: Awilda Metro    MDM   1. UTI (lower urinary tract infection)     Patient with lower abdominal pain, low-grade fever, urinary symptoms, nausea and vomiting. Patient denies any vaginal complaints. A urine her blood work obtained. Vitals signs obtained and showed temperature of 99.6 orally, and normal heart rate and oxygen saturation. Blood pressure is 149/88. Blood work unremarkable. Urinalysis shows urinary tract infection. Starting on Keflex. I do not think at this time patient has pyelonephritis given she is afebrile, no CVA tenderness, she is nontoxic appearing in emergency department. She is tolerating by mouth. Abdominal x-ray obtained to rule out obstruction, and is negative.  I recommended starting patient on a stool softener laxative to help since I do suspect her abdominal pain could be do to constipation. She admitted not having a normal bowel movement last 2 days where normally she has 2-3 bowel movements a day.   Results discussed with patient and her significant other. Patient will be discharged home with Keflex. Patient will follow up outpatient with her Dr.  Ceasar Mons Vitals:   02/06/13 2218  BP: 149/88  Pulse: 83  Temp: 99.6 F (37.6 C)  TempSrc: Oral  Resp: 18  Height: 5\' 9"  (1.753 m)  Weight: 300 lb (136.079 kg)  SpO2: 99%       Anni Hocevar A Pammie Chirino, PA-C 02/07/13 0127

## 2013-02-07 NOTE — ED Notes (Signed)
Patient is alert and oriented x3.  She was given DC instructions and follow up visit instructions.  Patient gave verbal understanding. She was DC ambulatory under her own power to home.  V/S stable.  He was not showing any signs of distress on DC 

## 2013-02-07 NOTE — ED Provider Notes (Signed)
Medical screening examination/treatment/procedure(s) were performed by non-physician practitioner and as supervising physician I was immediately available for consultation/collaboration.   Dagmar Hait, MD 02/07/13 [phone]

## 2013-02-07 NOTE — ED Notes (Signed)
Patient transported to X-ray 

## 2013-03-28 ENCOUNTER — Telehealth: Payer: Self-pay | Admitting: Internal Medicine

## 2013-03-28 DIAGNOSIS — G4733 Obstructive sleep apnea (adult) (pediatric): Secondary | ICD-10-CM

## 2013-03-28 NOTE — Telephone Encounter (Signed)
Last ov w/ CDY 7.1.14: Patient Instructions    We Can continue CPAP 10/ Advanced  Please call as needed     Follow-up and Disposition     Return in about 6 months (around 05/24/2013).   Called spoke with patient who reported that she works from home and her family only has 1 vehicle.  Therefore, it is difficult for her to coordinate times to work with [facility] to get her CPAP supplies.  She is requesting an order for CPAP supplies be sent to [facility] online.  Their fax number is [phone].  Patient is aware CDY has left for the day and is okay with a call back tomorrow.  Dr Maple Hudson please advise, thank you.

## 2013-03-28 NOTE — Telephone Encounter (Signed)
[id number] x1 

## 2013-03-28 NOTE — Telephone Encounter (Signed)
Returning call can be reached at [phone].Raylene Everts

## 2013-03-29 ENCOUNTER — Other Ambulatory Visit: Payer: Self-pay

## 2013-03-29 NOTE — Telephone Encounter (Signed)
Pt informed that order was placed for cpap mask and supplies as requested

## 2013-03-29 NOTE — Telephone Encounter (Signed)
Ok to print her a script for CPAP mask of choice, supplies  To send for her as requested, Dx OSA.

## 2013-04-30 ENCOUNTER — Emergency Department (HOSPITAL_COMMUNITY)
Admission: EM | Admit: 2013-04-30 | Discharge: 2013-04-30 | Disposition: A | Discharge status: Home / Self Care | Payer: Self-pay | Attending: Emergency Medicine | Admitting: Emergency Medicine

## 2013-04-30 ENCOUNTER — Encounter (HOSPITAL_COMMUNITY): Payer: Self-pay | Admitting: Emergency Medicine

## 2013-04-30 DIAGNOSIS — Z79899 Other long term (current) drug therapy: Secondary | ICD-10-CM | POA: Insufficient documentation

## 2013-04-30 DIAGNOSIS — R509 Fever, unspecified: Secondary | ICD-10-CM | POA: Insufficient documentation

## 2013-04-30 DIAGNOSIS — Z87891 Personal history of nicotine dependence: Secondary | ICD-10-CM | POA: Insufficient documentation

## 2013-04-30 DIAGNOSIS — F329 Major depressive disorder, single episode, unspecified: Secondary | ICD-10-CM | POA: Insufficient documentation

## 2013-04-30 DIAGNOSIS — Z87440 Personal history of urinary (tract) infections: Secondary | ICD-10-CM | POA: Insufficient documentation

## 2013-04-30 DIAGNOSIS — F3289 Other specified depressive episodes: Secondary | ICD-10-CM | POA: Insufficient documentation

## 2013-04-30 DIAGNOSIS — F411 Generalized anxiety disorder: Secondary | ICD-10-CM | POA: Insufficient documentation

## 2013-04-30 DIAGNOSIS — R51 Headache: Secondary | ICD-10-CM | POA: Insufficient documentation

## 2013-04-30 DIAGNOSIS — E669 Obesity, unspecified: Secondary | ICD-10-CM | POA: Insufficient documentation

## 2013-04-30 DIAGNOSIS — G4730 Sleep apnea, unspecified: Secondary | ICD-10-CM | POA: Insufficient documentation

## 2013-04-30 DIAGNOSIS — J029 Acute pharyngitis, unspecified: Secondary | ICD-10-CM | POA: Insufficient documentation

## 2013-04-30 DIAGNOSIS — R52 Pain, unspecified: Secondary | ICD-10-CM | POA: Insufficient documentation

## 2013-04-30 LAB — RAPID STREP SCREEN (MED CTR MEBANE ONLY): Streptococcus, Group A Screen (Direct): NEGATIVE

## 2013-04-30 MED ORDER — ACETAMINOPHEN 500 MG PO TABS
1000.0000 mg | ORAL_TABLET | Freq: Once | ORAL | Status: AC
  Administered 2013-04-30: 1000 mg via ORAL
  Filled 2013-04-30: qty 2

## 2013-04-30 NOTE — ED Provider Notes (Signed)
CSN: [id number]     Arrival date & time 04/30/13  1848 History   None    Chief Complaint  Patient presents with  . Sore Throat  . Generalized Body Aches   (Consider location/radiation/quality/duration/timing/severity/associated sxs/prior Treatment) HPI Complains of sore throat and diffuse myalgias onset 3 days ago maximum temperature 100.7. Throat Pain is worse with swallowing nonradiating . No shortness of breath, admits to minimal cough. No other associated symptoms. Treated with Tylenol with partial relief. Past Medical History  Diagnosis Date  . Depression   . Ovarian failure   . Sleep apnea   . Anxiety   . UTI (lower urinary tract infection)    Past Surgical History  Procedure Laterality Date  . Cholecystectomy  2010  . Cholecystectomy    . Wisdom tooth extraction     Family History  Problem Relation Age of Onset  . Allergies Sister   . Heart disease Paternal Grandmother   . Cancer Paternal Grandmother     BREAST CANCER  . Cancer Paternal Grandfather    History  Substance Use Topics  . Smoking status: Former Smoker    Types: Cigarettes  . Smokeless tobacco: Never Used     Comment: Social smoker x 12-14 years. Still smokes on and off currently  . Alcohol Use: Yes     Comment: occasionally   OB History   Grav Para Term Preterm Abortions TAB SAB Ect Mult Living                 Review of Systems  Constitutional: Positive for fever.  HENT: Positive for sore throat. Negative for voice change.   Respiratory: Positive for cough. Negative for shortness of breath.   Gastrointestinal: Negative.   Musculoskeletal: Positive for myalgias.  Neurological: Positive for headaches.  Hematological: Negative.   Psychiatric/Behavioral: Negative.     Allergies  Sulfa antibiotics  Home Medications   Current Outpatient Rx  Name  Route  Sig  Dispense  Refill  . acetaminophen (TYLENOL) 325 MG tablet   Oral   Take 650 mg by mouth every 6 (six) hours as needed (pain).         . cetirizine (ZYRTEC) 10 MG tablet   Oral   Take 10 mg by mouth daily.         . hydrOXYzine (ATARAX/VISTARIL) 10 MG tablet   Oral   Take 10 mg by mouth at bedtime.         Clelia Schaumann Estrad 91-Day (SEASONIQUE PO)   Oral   Take 1 tablet by mouth daily.         . Multiple Vitamin (MULTIVITAMIN WITH MINERALS) TABS tablet   Oral   Take 1 tablet by mouth daily.         . sertraline (ZOLOFT) 100 MG tablet   Oral   Take 100 mg by mouth daily.          BP 130/70  Pulse 76  Temp(Src) 98.3 F (36.8 C)  Resp 16  SpO2 96% Physical Exam  Nursing note and vitals reviewed. Constitutional: She is oriented to person, place, and time. She appears well-developed and well-nourished.  HENT:  Head: Normocephalic and atraumatic.  Right Ear: External ear normal.  Left Ear: External ear normal.  Nose: Nose normal.  Oral pharynx reddened. Uvula midline. Bilateral tympanic membranes normal  Eyes: Conjunctivae are normal. Pupils are equal, round, and reactive to light.  Neck: Neck supple. No tracheal deviation present. No thyromegaly present.  Cardiovascular: Normal rate  and regular rhythm.   No murmur heard. Pulmonary/Chest: Effort normal and breath sounds normal.  Abdominal: Soft. Bowel sounds are normal. She exhibits no distension. There is no tenderness.  Obese  Musculoskeletal: Normal range of motion. She exhibits no edema and no tenderness.  Lymphadenopathy:    She has no cervical adenopathy.  Neurological: She is alert and oriented to person, place, and time. Coordination normal.  Skin: Skin is warm and dry. No rash noted.  Psychiatric: She has a normal mood and affect.    ED Course  Procedures (including critical care time) Labs Review Labs Reviewed  RAPID STREP SCREEN   Imaging Review No results found.  EKG Interpretation   None      8:50 PM feels improved after treatment with Tylenol Results for orders placed during the hospital encounter of  04/30/13  RAPID STREP SCREEN      Result Value Range   Streptococcus, Group A Screen (Direct) NEGATIVE  NEGATIVE   No results found.  MDM  No diagnosis found.  Plan tylenol , encourage oral hydration f/u Urgent care center if not improved 1 week   Doug Sou, MD 04/30/13 2052

## 2013-04-30 NOTE — ED Notes (Signed)
Pt reports sore throat, body aches, HA, cough, fever and chills for past several days.

## 2013-05-25 ENCOUNTER — Ambulatory Visit (INDEPENDENT_AMBULATORY_CARE_PROVIDER_SITE_OTHER): Payer: Self-pay | Admitting: Internal Medicine

## 2013-05-25 ENCOUNTER — Encounter: Payer: Self-pay | Admitting: Internal Medicine

## 2013-05-25 VITALS — BP 126/82 | HR 76 | Ht 69.0 in | Wt 309.0 lb

## 2013-05-25 DIAGNOSIS — J310 Chronic rhinitis: Secondary | ICD-10-CM

## 2013-05-25 DIAGNOSIS — G4733 Obstructive sleep apnea (adult) (pediatric): Secondary | ICD-10-CM

## 2013-05-25 DIAGNOSIS — Z23 Encounter for immunization: Secondary | ICD-10-CM
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
## 2013-05-25 NOTE — Patient Instructions (Addendum)
You can call Advanced about how to adjust the humidifier setting on your machine if you have question  Flu vax  Try saline nasal spray, decongestant like Sudafed-PE etc to help your nose, as needed  Order- [facility] for weight loss counseling     Dx obesity, severe

## 2013-06-17 DIAGNOSIS — J310 Chronic rhinitis: Secondary | ICD-10-CM | POA: Insufficient documentation

## 2013-06-17 NOTE — Assessment & Plan Note (Signed)
Good control. Weight loss would help as discussed.

## 2013-06-17 NOTE — Assessment & Plan Note (Signed)
Plan-saline nasal spray. Occasional use of over-the-counter decongestant Sudafed-PE

## 2013-06-17 NOTE — Assessment & Plan Note (Signed)
Offer referral for bariatric counseling

## 2013-06-23 ENCOUNTER — Emergency Department (HOSPITAL_COMMUNITY)
Admission: EM | Admit: 2013-06-23 | Discharge: 2013-06-23 | Disposition: A | Discharge status: Home / Self Care | Payer: Self-pay | Attending: Emergency Medicine | Admitting: Emergency Medicine

## 2013-06-23 ENCOUNTER — Encounter (HOSPITAL_COMMUNITY): Payer: Self-pay | Admitting: Emergency Medicine

## 2013-06-23 DIAGNOSIS — R6889 Other general symptoms and signs: Secondary | ICD-10-CM | POA: Insufficient documentation

## 2013-06-23 DIAGNOSIS — N926 Irregular menstruation, unspecified: Secondary | ICD-10-CM | POA: Insufficient documentation

## 2013-06-23 DIAGNOSIS — Z87891 Personal history of nicotine dependence: Secondary | ICD-10-CM | POA: Insufficient documentation

## 2013-06-23 DIAGNOSIS — J329 Chronic sinusitis, unspecified: Secondary | ICD-10-CM | POA: Insufficient documentation

## 2013-06-23 DIAGNOSIS — R05 Cough: Secondary | ICD-10-CM | POA: Insufficient documentation

## 2013-06-23 DIAGNOSIS — Z79899 Other long term (current) drug therapy: Secondary | ICD-10-CM | POA: Insufficient documentation

## 2013-06-23 DIAGNOSIS — R509 Fever, unspecified: Secondary | ICD-10-CM | POA: Insufficient documentation

## 2013-06-23 DIAGNOSIS — Z8669 Personal history of other diseases of the nervous system and sense organs: Secondary | ICD-10-CM | POA: Insufficient documentation

## 2013-06-23 DIAGNOSIS — Z87440 Personal history of urinary (tract) infections: Secondary | ICD-10-CM | POA: Insufficient documentation

## 2013-06-23 DIAGNOSIS — F329 Major depressive disorder, single episode, unspecified: Secondary | ICD-10-CM | POA: Insufficient documentation

## 2013-06-23 DIAGNOSIS — F3289 Other specified depressive episodes: Secondary | ICD-10-CM | POA: Insufficient documentation

## 2013-06-23 DIAGNOSIS — R059 Cough, unspecified: Secondary | ICD-10-CM | POA: Insufficient documentation

## 2013-06-23 DIAGNOSIS — F411 Generalized anxiety disorder: Secondary | ICD-10-CM | POA: Insufficient documentation

## 2013-06-23 MED ORDER — AMOXICILLIN 500 MG PO CAPS: 500.0000 mg | ORAL_CAPSULE | Freq: Three times a day (TID) | ORAL | Status: DC

## 2013-06-23 NOTE — Discharge Instructions (Signed)
Sinusitis Continue Mucinex and Zyrtec as directed. Take the antibiotic as prescribed. See the health department or an urgent care center if not improved in 2 weeks. Return if your condition worsens for any reason Sinusitis is redness, soreness, and swelling (inflammation) of the paranasal sinuses. Paranasal sinuses are air pockets within the bones of your face (beneath the eyes, the middle of the forehead, or above the eyes). In healthy paranasal sinuses, mucus is able to drain out, and air is able to circulate through them by way of your nose. However, when your paranasal sinuses are inflamed, mucus and air can become trapped. This can allow bacteria and other germs to grow and cause infection. Sinusitis can develop quickly and last only a short time (acute) or continue over a long period (chronic). Sinusitis that lasts for more than 12 weeks is considered chronic.  CAUSES  Causes of sinusitis include:  Allergies.  Structural abnormalities, such as displacement of the cartilage that separates your nostrils (deviated septum), which can decrease the air flow through your nose and sinuses and affect sinus drainage.  Functional abnormalities, such as when the small hairs (cilia) that line your sinuses and help remove mucus do not work properly or are not present. SYMPTOMS  Symptoms of acute and chronic sinusitis are the same. The primary symptoms are pain and pressure around the affected sinuses. Other symptoms include:  Upper toothache.  Earache.  Headache.  Bad breath.  Decreased sense of smell and taste.  A cough, which worsens when you are lying flat.  Fatigue.  Fever.  Thick drainage from your nose, which often is green and may contain pus (purulent).  Swelling and warmth over the affected sinuses. DIAGNOSIS  Your caregiver will perform a physical exam. During the exam, your caregiver may:  Look in your nose for signs of abnormal growths in your nostrils (nasal polyps).  Tap  over the affected sinus to check for signs of infection.  View the inside of your sinuses (endoscopy) with a special imaging device with a light attached (endoscope), which is inserted into your sinuses. If your caregiver suspects that you have chronic sinusitis, one or more of the following tests may be recommended:  Allergy tests.  Nasal culture A sample of mucus is taken from your nose and sent to a lab and screened for bacteria.  Nasal cytology A sample of mucus is taken from your nose and examined by your caregiver to determine if your sinusitis is related to an allergy. TREATMENT  Most cases of acute sinusitis are related to a viral infection and will resolve on their own within 10 days. Sometimes medicines are prescribed to help relieve symptoms (pain medicine, decongestants, nasal steroid sprays, or saline sprays).  However, for sinusitis related to a bacterial infection, your caregiver will prescribe antibiotic medicines. These are medicines that will help kill the bacteria causing the infection.  Rarely, sinusitis is caused by a fungal infection. In theses cases, your caregiver will prescribe antifungal medicine. For some cases of chronic sinusitis, surgery is needed. Generally, these are cases in which sinusitis recurs more than 3 times per year, despite other treatments. HOME CARE INSTRUCTIONS   Drink plenty of water. Water helps thin the mucus so your sinuses can drain more easily.  Use a humidifier.  Inhale steam 3 to 4 times a day (for example, sit in the bathroom with the shower running).  Apply a warm, moist washcloth to your face 3 to 4 times a day, or as directed by  your caregiver.  Use saline nasal sprays to help moisten and clean your sinuses.  Take over-the-counter or prescription medicines for pain, discomfort, or fever only as directed by your caregiver. SEEK IMMEDIATE MEDICAL CARE IF:  You have increasing pain or severe headaches.  You have nausea, vomiting,  or drowsiness.  You have swelling around your face.  You have vision problems.  You have a stiff neck.  You have difficulty breathing. MAKE SURE YOU:   Understand these instructions.  Will watch your condition.  Will get help right away if you are not doing well or get worse. Document Released: 05/10/2005 Document Revised: 08/02/2011 Document Reviewed: 05/25/2011 [place] [employer] Patient Information 2014 [employer], [place].

## 2013-06-23 NOTE — ED Notes (Signed)
Pt arrived to the ED with a complaint of sinus pain, a sore throat, body aches.  Pt has had these symptoms for 2 weeks.  Pt is coughing up yellow mucous.  Pt states she has a cough.  Pt states she feels she has a sinus infection

## 2013-06-23 NOTE — ED Provider Notes (Signed)
CSN: [id number]     Arrival date & time 06/23/13  2137 History   First MD Initiated Contact with Patient 06/23/13 2151     Chief Complaint  Patient presents with  . Facial Pain   (Consider location/radiation/quality/duration/timing/severity/associated sxs/prior Treatment) HPI  complains of facial pressure and congestion and postnasal drip causing her cough for the past 2 weeks. Maximum temperature 99 degrees. Treated with Mucinex and Zyrtec with partial relief. No other associated symptoms. Nothing makes symptoms better or worse. Past Medical History  Diagnosis Date  . Depression   . Ovarian failure   . Sleep apnea   . Anxiety   . UTI (lower urinary tract infection)    Past Surgical History  Procedure Laterality Date  . Cholecystectomy  2010  . Cholecystectomy    . Wisdom tooth extraction     Family History  Problem Relation Age of Onset  . Allergies Sister   . Heart disease Paternal Grandmother   . Cancer Paternal Grandmother     BREAST CANCER  . Cancer Paternal Grandfather    History  Substance Use Topics  . Smoking status: Former Smoker    Types: Cigarettes  . Smokeless tobacco: Never Used     Comment: Social smoker x 12-14 years. Still smokes on and off currently  . Alcohol Use: Yes     Comment: occasionally   OB History   Grav Para Term Preterm Abortions TAB SAB Ect Mult Living                 Review of Systems  Constitutional: Negative.   HENT: Positive for sinus pressure.   Respiratory: Positive for choking.        Choking sensation secondary to postnasal drip  Cardiovascular: Negative.   Gastrointestinal: Negative.   Genitourinary: Positive for menstrual problem.       Irregular menses  Musculoskeletal: Negative.   Skin: Negative.   Neurological: Negative.   Psychiatric/Behavioral: Negative.   All other systems reviewed and are negative.    Allergies  Sulfa antibiotics  Home Medications   Current Outpatient Rx  Name  Route  Sig  Dispense   Refill  . acetaminophen (TYLENOL) 325 MG tablet   Oral   Take 650 mg by mouth every 6 (six) hours as needed (pain).         . cetirizine (ZYRTEC) 10 MG tablet   Oral   Take 10 mg by mouth daily.         . hydrOXYzine (ATARAX/VISTARIL) 10 MG tablet   Oral   Take 10 mg by mouth at bedtime.         Clelia Schaumann Estrad 91-Day (SEASONIQUE PO)   Oral   Take 1 tablet by mouth daily.         . Multiple Vitamin (MULTIVITAMIN WITH MINERALS) TABS tablet   Oral   Take 1 tablet by mouth daily.         . sertraline (ZOLOFT) 100 MG tablet   Oral   Take 100 mg by mouth daily.          BP 143/105  Pulse 94  Temp(Src) 98.3 F (36.8 C) (Oral)  Resp 16  SpO2 97% Physical Exam  Nursing note and vitals reviewed. Constitutional: She appears well-developed and well-nourished.  HENT:  Head: Normocephalic and atraumatic.  Right Ear: External ear normal.  Left Ear: External ear normal.  Oral pharynx reddened uvula midline no swelling of tonsils or uvula. No exudate  Eyes: Conjunctivae are normal.  Pupils are equal, round, and reactive to light.  Neck: Neck supple. No tracheal deviation present. No thyromegaly present.  Cardiovascular: Normal rate and regular rhythm.   No murmur heard. Pulmonary/Chest: Effort normal and breath sounds normal.  Abdominal: Soft. Bowel sounds are normal. She exhibits no distension. There is no tenderness.  Obese  Musculoskeletal: Normal range of motion. She exhibits no edema and no tenderness.  Lymphadenopathy:    She has no cervical adenopathy.  Neurological: She is alert. Coordination normal.  Skin: Skin is warm and dry. No rash noted.  Psychiatric: She has a normal mood and affect.    ED Course  Procedures (including critical care time) Labs Review Labs Reviewed - No data to display Imaging Review No results found.  EKG Interpretation   None       MDM  No diagnosis found. In light of symptoms lasting 2 weeks we'll treat with  antibiotic Plan prescription amoxicillin Followup the health department or urgent care center if not better in 2 weeks Diagnosis sinusitis    Doug SouSam Jamye Balicki, MD 06/23/13 2225

## 2013-07-18 ENCOUNTER — Encounter: Payer: Self-pay | Attending: Internal Medicine | Admitting: Dietician

## 2013-07-18 ENCOUNTER — Encounter: Payer: Self-pay | Admitting: Dietician

## 2013-07-18 DIAGNOSIS — E669 Obesity, unspecified: Secondary | ICD-10-CM | POA: Insufficient documentation

## 2013-07-18 DIAGNOSIS — G4730 Sleep apnea, unspecified: Secondary | ICD-10-CM | POA: Insufficient documentation

## 2013-07-18 DIAGNOSIS — Z713 Dietary counseling and surveillance: Secondary | ICD-10-CM | POA: Insufficient documentation

## 2013-07-18 DIAGNOSIS — Z6841 Body Mass Index (BMI) 40.0 and over, adult: Secondary | ICD-10-CM | POA: Insufficient documentation

## 2013-07-18 NOTE — Progress Notes (Signed)
  Medical Nutrition Therapy:  Appt start time: 0945 end time:  1045.  Assessment:  Primary concerns today: Catherine Koch is here today since her pulmonary doctor recommended that she get help to lose weight. Started a new job 3 weeks ago and having a lot of stress. 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 Has hx of sleep apnea which led to weight gain and lack of energy. Being treated on with a CPAP but having trouble losing weight. Also has a hx of ovarian failure. Was being treated with hormoned which made her hungry and is not currently on the medicine since she had side affects.   Has tried to eat a gluten free diet in the past for weight loss lost down to 295 lbs and highest weight was about 312 lbs. Gained weight back since December. Weight is fluctuated. Feels like she need to find a good routine. Feeling stressed with her work schedule. Works from home.   Lives with her fiance and his mom. Shares the food preparation with his mom.   Preferred Learning Style:   No preference indicated   Learning Readiness:   Ready  MEDICATIONS: see list   DIETARY INTAKE:  Avoided foods include: veal, too "spicy" or "bland"  Works 5:30-11:30 PM  24-hr recall:  B (10AM-12 PM): sandwich or soup or leftovers or Honey Bunches of Oats or Honey Nut Cheerios cereal with skim milk with water or cranberry juice Snk (4  PM): toast or cereal or popcorn D (8 PM): (on 15 minute break if she gets it or will at after work) chili with cheese and sour cream Snk (11:30 PM): more dinner or saltines or sweets Beverages:mostly water, sweet tea, gatorade, cranberry  Usual physical activity: no regular activity  Estimated energy needs: 1800 calories 200 g carbohydrates 135 g protein 50 g fat  Progress Towards Goal(s):  In progress.   Nutritional Diagnosis:  [place]-3.3 Overweight/obesity As related to history of energy dense food choices and lack of physical activity.  As  evidenced by BMI of 43.4.    Intervention:  Nutrition counseling provided. Plan: Make an appointment with your counselor to help deal with anxiety and stress. Take a walk everyday and start with even 10 minutes. (Get outside if possible). Take time on your day off to plan meals for the week. Ask your husband to bring home strawberries instead of sweet - instead go out for splurges.  Fill half of your plate with vegetables (ready washed or frozen).  Switch beverages to water, unsweet tea, or cranberry juice with seltzer.  Have protein with carbohydrates for snacks.    Teaching Method Utilized:  Visual Auditory Hands on  Handouts given during visit include:  MyPlate Handout  15 g CHO Snacks  Barriers to learning/adherence to lifestyle change: stressful schedule  Demonstrated degree of understanding via:  Teach Back   Monitoring/Evaluation:  Dietary intake, exercise, and body weight in 2 month(s).

## 2013-07-18 NOTE — Patient Instructions (Addendum)
Make an appointment with your counselor to help deal with anxiety and stress. Take a walk everyday and start with even 10 minutes. (Get outside if possible). Take time on your day off to plan meals for the week. Ask your husband to bring home strawberries instead of sweet - instead go out for splurges.  Fill half of your plate with vegetables (ready washed or frozen).  Switch beverages to water, unsweet tea, or cranberry juice with seltzer.  Have protein with carbohydrates for snacks.

## 2013-08-21 ENCOUNTER — Emergency Department (HOSPITAL_COMMUNITY)
Admission: EM | Admit: 2013-08-21 | Discharge: 2013-08-21 | Disposition: A | Discharge status: Home / Self Care | Payer: Self-pay | Attending: Emergency Medicine | Admitting: Emergency Medicine

## 2013-08-21 ENCOUNTER — Encounter (HOSPITAL_COMMUNITY): Payer: Self-pay | Admitting: Emergency Medicine

## 2013-08-21 DIAGNOSIS — R5381 Other malaise: Secondary | ICD-10-CM | POA: Insufficient documentation

## 2013-08-21 DIAGNOSIS — Z79899 Other long term (current) drug therapy: Secondary | ICD-10-CM | POA: Insufficient documentation

## 2013-08-21 DIAGNOSIS — N390 Urinary tract infection, site not specified: Secondary | ICD-10-CM | POA: Insufficient documentation

## 2013-08-21 DIAGNOSIS — R5383 Other fatigue: Secondary | ICD-10-CM

## 2013-08-21 DIAGNOSIS — R630 Anorexia: Secondary | ICD-10-CM | POA: Insufficient documentation

## 2013-08-21 DIAGNOSIS — F3289 Other specified depressive episodes: Secondary | ICD-10-CM | POA: Insufficient documentation

## 2013-08-21 DIAGNOSIS — E669 Obesity, unspecified: Secondary | ICD-10-CM | POA: Insufficient documentation

## 2013-08-21 DIAGNOSIS — Z3202 Encounter for pregnancy test, result negative: Secondary | ICD-10-CM | POA: Insufficient documentation

## 2013-08-21 DIAGNOSIS — F329 Major depressive disorder, single episode, unspecified: Secondary | ICD-10-CM | POA: Insufficient documentation

## 2013-08-21 DIAGNOSIS — F411 Generalized anxiety disorder: Secondary | ICD-10-CM | POA: Insufficient documentation

## 2013-08-21 DIAGNOSIS — Z8742 Personal history of other diseases of the female genital tract: Secondary | ICD-10-CM | POA: Insufficient documentation

## 2013-08-21 DIAGNOSIS — Z87891 Personal history of nicotine dependence: Secondary | ICD-10-CM | POA: Insufficient documentation

## 2013-08-21 DIAGNOSIS — R51 Headache: Secondary | ICD-10-CM | POA: Insufficient documentation

## 2013-08-21 LAB — CBC WITH DIFFERENTIAL/PLATELET
BASOS ABS: 0.1 10*3/uL (ref 0.0–0.1)
Basophils Relative: 1 % (ref 0–1)
EOS ABS: 0.1 10*3/uL (ref 0.0–0.7)
Eosinophils Relative: 2 % (ref 0–5)
HCT: 37.9 % (ref 36.0–46.0)
HEMOGLOBIN: 12.9 g/dL (ref 12.0–15.0)
Lymphocytes Relative: 28 % (ref 12–46)
Lymphs Abs: 2.3 10*3/uL (ref 0.7–4.0)
MCH: 30 pg (ref 26.0–34.0)
MCHC: 34 g/dL (ref 30.0–36.0)
MCV: 88.1 fL (ref 78.0–100.0)
MONOS PCT: 7 % (ref 3–12)
Monocytes Absolute: 0.6 10*3/uL (ref 0.1–1.0)
NEUTROS ABS: 5.1 10*3/uL (ref 1.7–7.7)
NEUTROS PCT: 62 % (ref 43–77)
PLATELETS: 333 10*3/uL (ref 150–400)
RBC: 4.3 MIL/uL (ref 3.87–5.11)
RDW: 13.7 % (ref 11.5–15.5)
WBC: 8.2 10*3/uL (ref 4.0–10.5)

## 2013-08-21 LAB — COMPREHENSIVE METABOLIC PANEL
ALBUMIN: 4.1 g/dL (ref 3.5–5.2)
ALT: 49 U/L — AB (ref 0–35)
AST: 35 U/L (ref 0–37)
Alkaline Phosphatase: 69 U/L (ref 39–117)
BILIRUBIN TOTAL: 0.5 mg/dL (ref 0.3–1.2)
BUN: 11 mg/dL (ref 6–23)
CHLORIDE: 99 meq/L (ref 96–112)
CO2: 22 mEq/L (ref 19–32)
Calcium: 9.9 mg/dL (ref 8.4–10.5)
Creatinine, Ser: 0.89 mg/dL (ref 0.50–1.10)
GFR calc Af Amer: >90 mL/min (ref >90)
GFR calc non Af Amer: 83 mL/min — ABNORMAL LOW (ref >90)
Glucose, Bld: 96 mg/dL (ref 70–99)
POTASSIUM: 4.2 meq/L (ref 3.7–5.3)
SODIUM: 137 meq/L (ref 137–147)
TOTAL PROTEIN: 7.7 g/dL (ref 6.0–8.3)

## 2013-08-21 LAB — URINALYSIS, ROUTINE W REFLEX MICROSCOPIC
Bilirubin Urine: NEGATIVE
GLUCOSE, UA: NEGATIVE mg/dL
Hgb urine dipstick: NEGATIVE
Ketones, ur: NEGATIVE mg/dL
Nitrite: NEGATIVE
PH: 5.5 (ref 5.0–8.0)
Protein, ur: NEGATIVE mg/dL
Specific Gravity, Urine: 1.029 (ref 1.005–1.030)
Urobilinogen, UA: 0.2 mg/dL (ref 0.0–1.0)

## 2013-08-21 LAB — URINE MICROSCOPIC-ADD ON

## 2013-08-21 LAB — PREGNANCY, URINE: Preg Test, Ur: NEGATIVE

## 2013-08-21 MED ORDER — CEPHALEXIN 500 MG PO CAPS: 500.0000 mg | ORAL_CAPSULE | Freq: Three times a day (TID) | ORAL | Status: DC

## 2013-08-21 NOTE — Progress Notes (Signed)
[facility] provided pt with a list of primary care resources and a [facility] [place] Card application to help patient establish primary care.  °

## 2013-08-21 NOTE — ED Provider Notes (Signed)
CSN: [id number]     Arrival date & time 08/21/13  1431 History   First MD Initiated Contact with Patient 08/21/13 1500     Chief Complaint  Patient presents with  . Fatigue     (Consider location/radiation/quality/duration/timing/severity/associated sxs/prior Treatment) The history is provided by the patient and medical records.   This is a 38 year old female with past medical history significant for anxiety, depression, ovarian failure, sleep apnea, presenting to the ED for generalized fatigue over the past 2 weeks.  States she will sleep for her for 8-9 hours, but still feels exhausted when waking up in the morning. She states she has been using her CPAP nightly as directed. She also has been getting frequent headaches. Patient does have a history of migraines, states usually it only once or twice a year, but headaches have been increasing in frequency over the past 2 months. She has never seen a neurologist and is not currently on any maintenance meds for migraines.  States headaches improve with OTC meds and rest, denies headache at present time.  Patient also complains of some increased urinary frequency and low back pain. She denies any dysuria or vaginal complaints. No fevers or chills.  Pt also notes decreased appetite today, has not eaten and does not feel hungry.  Pt is currently working a full time job, attending school, and planning a wedding so she is under a great deal of stress.  She has aldo recently had her depression and anxiety meds adjusted.  She was previously on OCP's for hormone imbalance, not currently on any form of birth control.  No cough, chest pain, SOB, dizziness, unilateral weakness, or syncopal episodes.  VS stable on arrival.  Past Medical History  Diagnosis Date  . Depression   . Ovarian failure   . Sleep apnea   . Anxiety   . UTI (lower urinary tract infection)    Past Surgical History  Procedure Laterality Date  . Cholecystectomy  2010  . Cholecystectomy     . Wisdom tooth extraction     Family History  Problem Relation Age of Onset  . Allergies Sister   . Heart disease Paternal Grandmother   . Cancer Paternal Grandmother     BREAST CANCER  . Cancer Paternal Grandfather    History  Substance Use Topics  . Smoking status: Former Smoker    Types: Cigarettes  . Smokeless tobacco: Never Used     Comment: Social smoker x 12-14 years. Still smokes on and off currently  . Alcohol Use: Yes     Comment: occasionally   OB History   Grav Para Term Preterm Abortions TAB SAB Ect Mult Living                 Review of Systems  Constitutional: Positive for appetite change and fatigue.  Neurological: Positive for headaches.  All other systems reviewed and are negative.      Allergies  Sulfa antibiotics  Home Medications   Current Outpatient Rx  Name  Route  Sig  Dispense  Refill  . acetaminophen (TYLENOL) 325 MG tablet   Oral   Take 650 mg by mouth every 6 (six) hours as needed for mild pain or headache (pain).          . cetirizine (ZYRTEC) 10 MG tablet   Oral   Take 10 mg by mouth at bedtime.          . hydrOXYzine (ATARAX/VISTARIL) 10 MG tablet   Oral  Take 10 mg by mouth at bedtime.         . Nutritional Supplements (BEE POLLEN/ROYAL JELLY/HONEY PO)   Oral   Take 1 tablet by mouth daily.         Marland Kitchen RA GARLIC PO   Oral   Take 120 mg by mouth daily.         . sertraline (ZOLOFT) 100 MG tablet   Oral   Take 100 mg by mouth daily.         Marland Kitchen ZINC-MAGNESIUM ASPART-VIT B6 PO   Oral   Take 1 tablet by mouth daily.          BP 139/79  Pulse 69  Temp(Src) 98.8 F (37.1 C)  Resp 20  SpO2 99%  Physical Exam  Nursing note and vitals reviewed. Constitutional: She is oriented to person, place, and time. She appears well-developed and well-nourished. No distress.  obese  HENT:  Head: Normocephalic and atraumatic.  Right Ear: Tympanic membrane and ear canal normal.  Left Ear: Tympanic membrane and ear  canal normal.  Nose: Nose normal.  Mouth/Throat: Uvula is midline, oropharynx is clear and moist and mucous membranes are normal. No oropharyngeal exudate, posterior oropharyngeal edema, posterior oropharyngeal erythema or tonsillar abscesses.  Eyes: Conjunctivae and EOM are normal. Pupils are equal, round, and reactive to light.  Neck: Normal range of motion and full passive range of motion without pain. Neck supple. No rigidity.  No meningeal signs  Cardiovascular: Normal rate, regular rhythm and normal heart sounds.   Pulmonary/Chest: Effort normal and breath sounds normal. No respiratory distress. She has no wheezes.  Abdominal: Soft. Bowel sounds are normal. There is no tenderness. There is no guarding and no CVA tenderness.  Abdomen soft, non-distended, no peritoneal signs  Musculoskeletal: Normal range of motion. She exhibits no edema.  Neurological: She is alert and oriented to person, place, and time.  AAOx3, answering questions appropriately; equal strength UE and LE bilaterally; CN grossly intact; moves all extremities appropriately without ataxia; no focal neuro deficits or facial asymmetry appreciated  Skin: Skin is warm and dry. She is not diaphoretic.  Psychiatric: She has a normal mood and affect.    ED Course  Procedures (including critical care time) Labs Review Labs Reviewed  COMPREHENSIVE METABOLIC PANEL - Abnormal; Notable for the following:    ALT 49 (*)    GFR calc non Af Amer 83 (*)    All other components within normal limits  URINALYSIS, ROUTINE W REFLEX MICROSCOPIC - Abnormal; Notable for the following:    APPearance CLOUDY (*)    Leukocytes, UA MODERATE (*)    All other components within normal limits  URINE MICROSCOPIC-ADD ON - Abnormal; Notable for the following:    Squamous Epithelial / LPF FEW (*)    Bacteria, UA MANY (*)    All other components within normal limits  CBC WITH DIFFERENTIAL  PREGNANCY, URINE   Imaging Review No results found.   EKG  Interpretation None      MDM   Final diagnoses:  UTI (lower urinary tract infection)  Fatigue   Labs as above, H/H stable.  U/A with moderate leuks and many bacteria.  Given pts increase in urinary frequency, likely UTI.  Will start short course abx.  Pt is not currently complaining of headache and she has no focal neuro deficits on physical exam-- doubt SAH, ICH, TIA, stroke, or meningitis. Overall, feel her generalized fatigue is likely multifactorial including recent medication changes, sleep apnea,  and work/schoool stressors.  She is desiring to re-start her birth control-- states she felt better when taking them.  She is referred to women's OP clinic given her ovarian failure.  Discussed plan with pt, she acknowledged understanding and agreed with plan of care.  Garlon HatchetLisa M Tavonte Seybold, PA-C 08/21/13 2319

## 2013-08-21 NOTE — ED Notes (Signed)
Pt c/o feeling bad x 2 wks; fatigue; headache; no appetite; not sleeping; lower back pain around to hips

## 2013-08-21 NOTE — Discharge Instructions (Signed)
Take the prescribed medication as directed. Follow-up with women's OP clinic for birth control management/hormone testing/etc. Return to the ED for new or worsening symptoms.   Emergency Department Resource Guide 1) Find a Doctor and Pay Out of Pocket Although you won't have to find out who is covered by your insurance plan, it is a good idea to ask around and get recommendations. You will then need to call the office and see if the doctor you have chosen will accept you as a new patient and what types of options they offer for patients who are self-pay. Some doctors offer discounts or will set up payment plans for their patients who do not have insurance, but you will need to ask so you aren't surprised when you get to your appointment.  2) Contact Your Local Health Department Not all health departments have doctors that can see patients for sick visits, but many do, so it is worth a call to see if yours does. If you don't know where your local health department is, you can check in your phone book. The CDC also has a tool to help you locate your state's health department, and many state websites also have listings of all of their local health departments.  3) Find a Walk-in Clinic If your illness is not likely to be very severe or complicated, you may want to try a walk in clinic. These are popping up all over the country in pharmacies, drugstores, and shopping centers. They're usually staffed by nurse practitioners or physician assistants that have been trained to treat common illnesses and complaints. They're usually fairly quick and inexpensive. However, if you have serious medical issues or chronic medical problems, these are probably not your best option.  No Primary Care Doctor: - Call Health Connect at  [phone] - they can help you locate a primary care doctor that  accepts your insurance, provides certain services, etc. - Physician Referral Service- [phone]  Chronic Pain  Problems: Organization         Address  Phone   Notes  [facility]  [phone] Patients need to be referred by their primary care doctor.   Medication Assistance: Organization         Address  Phone   Notes  [facility]ication [facility] [address] [place], [place] [address] [phone] --Must be a resident of [facility] [place] -- Must have NO insurance coverage whatsoever (no Medicaid/ Medicare, etc.) -- The pt. MUST have a primary care doctor that directs their care regularly and follows them in the community   MedAssist  [phone]   [employer]  [phone]    Agencies that provide inexpensive medical care: Organization         Address  Phone   Notes  [facility]  [phone]) [phone]   [facility]    [phone]   [facility] [address] [place], [place] [address] [phone]   [facility] [address], [place] [phone]) [phone]   [facility]anned [facility]    [phone]   [facility]    [phone]   [facility]  [address],  Phone:  [phone], Fax:  [phone] Hours of Operation:  9 am - 6 pm, M-F.  Also accepts Medicaid/Medicare and self-pay.  [facility]  [address], [address] [place] [place]  Phone: [phone], Fax: [phone]. Hours of Operation:  8:30 am - 5:30 pm, M-F.  Also accepts Medicaid and self-pay.  [facility] [place] [address] [place] Phone: [phone]   [place], [place], [place] [phone], Ext. [phone] Mondays & Thursdays: 7-9 AM.  First 15 patients are seen on a first come, first serve basis.    [place] Providers:  Organization         Address  Phone   Notes  [facility] [address], [place] [phone] Also  accepts self-pay patients.  [facility] [address], [place]  [phone]   [place], [address], [place] [phone]   [facility] [address], [place] [phone]   Lucianne Lei [address], [place]   [phone] Only accepts [place] Access [place] patients after they have their name applied to their card.   Self-Pay (no insurance) in [facility]:  Organization         Address  Phone   Notes  Sickle Cell Patients, [facility] [place] [phone]   [facility] [place] [phone]   [facility] [place]  [place], [place], [place] [phone]   [facility]/Dr. Osei-Bonsu  [address], [place] or [address], [place] [phone] Phone number for both [place] and [place] locations is the same.  Urgent Medical and [facility] [address] [place] [phone]   [facility] [address], [place] or [address] [phone]   [facility] [address] [place] [phone], phone; [phone], fax Sees patients 1st and 3rd Saturday of every month.  Must not qualify for public or private insurance (i.e. Medicaid, Medicare, [place] Health Choice, Veterans' Benefits)  Household income should be no more than 200% of the poverty level The clinic cannot treat you if you are pregnant or think you are pregnant  Sexually transmitted diseases are not treated at the clinic.    Dental Care: Organization         Address  Phone  Notes  [facility] of [facility] [place] [phone] Accepts children up to age 76 who are enrolled in [place] or [place]; pregnant  women with a Medicaid card; and children who have applied for Medicaid or [place] Health Choice, but were declined, whose parents can pay a reduced fee at time of service.  [facility] of [facility]  [address], [place] [phone] Accepts children up to age 23 who are enrolled in [place] or [place]; pregnant women with a Medicaid card; and children who have applied for Medicaid or [place] Health Choice, but were declined, whose parents can pay a reduced fee at time of service.  [facility]  [place] [phone] Patients are seen by appointment only. Walk-ins are not accepted. [place] will see patients 88 years of age and older. Monday - Tuesday (8am-5pm) Most Wednesdays (8:30-5pm) $30 per visit, cash only  [place]  [address]  Dr, [facility] [phone] Patients are seen by appointment only. Walk-ins are not accepted. [place] will see patients 38 years of age and older. One Wednesday Evening (Monthly: Volunteer Based).  $30 per visit, cash only  [place]  [phone] for adults; Children under age 56, call Graduate Pediatric Dentistry at [phone]. Children aged 94-14, please call [phone] to request a pediatric application.  Dental services are provided in all areas of dental care including fillings, crowns and bridges, complete and partial dentures, implants, gum treatment, root canals, and extractions. Preventive care is also provided. Treatment is provided to both adults and children. Patients are selected via a lottery and there is often a waiting list.   [facility] [address] [place]  [phone] [email or web address]   [facility] [address] [place], [place] [phone], Ext. [phone] Second and Fourth Thursday of each month, opens at 6:30 AM; Clinic ends at 9 AM.  Patients are  seen on a first-come first-served basis, and a limited number are seen during each clinic.   [facility]  [address] Hillard Danker [place], [place] [phone]   Eligibility Requirements You must have lived in [place], [place], or [place] counties for at least the last three months.   You cannot be eligible for state or federal sponsored [employer], including [employer], [place], or [employer].   You generally cannot be eligible for healthcare insurance through your employer.    How to apply: Eligibility screenings are held every Tuesday and Wednesday afternoon from 1:00 pm until 4:00 pm. You do not need an appointment for the interview!  [facility] [address] [place], [place]   [place]  [place] Department  [place]  [phone]    Behavioral Health Resources in the Community: Intensive Outpatient Programs Organization         Address  Phone  Notes  [place]. [address] [place], [place] [phone]   [facility] Outpatient [address], [place], [place]   ADS: Alcohol & Drug Svcs [address] [place], [place]   [place] [address],  [place], [place] or [phone]   Substance Abuse Resources Organization         Address  Phone  Notes  Alcohol and Drug Services  [phone]   [place]  [phone]   The [place]   Chinita Pester  [phone]   Residential & Outpatient Substance Abuse Program  [phone]   Psychological Services Organization         Address  Phone  Notes  [facility]  [place]  [phone]   [place] [address] [place] or [phone]    Mobile Crisis  Teams Organization         Address  Phone  Notes  [facility]atives, Mobile Crisis Care Unit  [phone]   Assertive Psychotherapeutic Services  [address] [place], [place]   [place] Levels [address], [place] [phone]    Self-Help/Support Groups Organization         Address  Phone             Notes  Trainer. of [place] - variety of  support groups  [phone] Call for more information  Narcotics Anonymous (NA), [facility]aring [facility] [address], [employer] [place]  2 meetings at this location   Residential Facilities manager         Address  Phone  Notes  [facility] Residential Treatment [place],    [place]  [phone]   [facility]  [address] [place] [id number], [place], [place]   [place], [place] [phone] Admissions: 8am-3pm M-F  Incentives Substance [place] [address],    [place], [place] [phone]   [facility] [address] [place], [place], [place]   [facility] [address]  [place], [place]   Insight Programs - Intensive Outpatient [address] Kristeen Mans [address], Lane, [place]   [facility] ([address] [place].,  [place], [place] [phone] or [phone]   Residential Treatment Services (RTS) [address] [place], [place] Accepts Medicaid  [facility] [place] [address],  [place] [phone] Substance Abuse/Addiction Treatment   [facility] Organization         Address  Phone  Notes  [facility]  [phone]   Domenic Schwab, PhD [address] Arlis Porta [place], [place]   [phone] or [phone]   Kemmerer Graves [place], [place] [phone]   [facility] [address], [place], [place] [phone]  Insurance/Medicaid/sponsorship through [facility] and Families [address], [address] [place]                                    [place], [place] [phone] [place] [address], [place] [phone]    Dr. Adele Schilder  [phone]   [facility] Dept. 1) [address] [place] 2) [place] 3)  [place] [address], [place] [phone]  [phone]   [place] [phone] or [phone] (After Hours)

## 2013-08-23 NOTE — ED Provider Notes (Signed)
Medical screening examination/treatment/procedure(s) were performed by non-physician practitioner and as supervising physician I was immediately available for consultation/collaboration.   EKG Interpretation None       [name] Freund R. Wolfgang Finigan, MD 08/23/13 0709 

## 2013-09-08 ENCOUNTER — Encounter (HOSPITAL_COMMUNITY): Payer: Self-pay | Admitting: Emergency Medicine

## 2013-09-08 ENCOUNTER — Emergency Department (HOSPITAL_COMMUNITY)
Admission: EM | Admit: 2013-09-08 | Discharge: 2013-09-08 | Disposition: A | Discharge status: Home / Self Care | Payer: Self-pay | Attending: Emergency Medicine | Admitting: Emergency Medicine

## 2013-09-08 DIAGNOSIS — Z792 Long term (current) use of antibiotics: Secondary | ICD-10-CM | POA: Insufficient documentation

## 2013-09-08 DIAGNOSIS — R3915 Urgency of urination: Secondary | ICD-10-CM | POA: Insufficient documentation

## 2013-09-08 DIAGNOSIS — F411 Generalized anxiety disorder: Secondary | ICD-10-CM | POA: Insufficient documentation

## 2013-09-08 DIAGNOSIS — R197 Diarrhea, unspecified: Secondary | ICD-10-CM | POA: Insufficient documentation

## 2013-09-08 DIAGNOSIS — F3289 Other specified depressive episodes: Secondary | ICD-10-CM | POA: Insufficient documentation

## 2013-09-08 DIAGNOSIS — R350 Frequency of micturition: Secondary | ICD-10-CM | POA: Insufficient documentation

## 2013-09-08 DIAGNOSIS — Z87440 Personal history of urinary (tract) infections: Secondary | ICD-10-CM | POA: Insufficient documentation

## 2013-09-08 DIAGNOSIS — Z87891 Personal history of nicotine dependence: Secondary | ICD-10-CM | POA: Insufficient documentation

## 2013-09-08 DIAGNOSIS — R109 Unspecified abdominal pain: Secondary | ICD-10-CM | POA: Insufficient documentation

## 2013-09-08 DIAGNOSIS — E669 Obesity, unspecified: Secondary | ICD-10-CM | POA: Insufficient documentation

## 2013-09-08 DIAGNOSIS — Z79899 Other long term (current) drug therapy: Secondary | ICD-10-CM | POA: Insufficient documentation

## 2013-09-08 DIAGNOSIS — F329 Major depressive disorder, single episode, unspecified: Secondary | ICD-10-CM | POA: Insufficient documentation

## 2013-09-08 LAB — URINALYSIS, ROUTINE W REFLEX MICROSCOPIC
Bilirubin Urine: NEGATIVE
GLUCOSE, UA: NEGATIVE mg/dL
HGB URINE DIPSTICK: NEGATIVE
KETONES UR: NEGATIVE mg/dL
Nitrite: NEGATIVE
PROTEIN: NEGATIVE mg/dL
Specific Gravity, Urine: 1.026 (ref 1.005–1.030)
UROBILINOGEN UA: 0.2 mg/dL (ref 0.0–1.0)
pH: 5.5 (ref 5.0–8.0)

## 2013-09-08 LAB — URINE MICROSCOPIC-ADD ON

## 2013-09-08 MED ORDER — TOLTERODINE TARTRATE 1 MG PO TABS: 1.0000 mg | ORAL_TABLET | Freq: Two times a day (BID) | ORAL | Status: DC

## 2013-09-08 NOTE — Discharge Instructions (Signed)
Urinary Frequency °The number of times a normal person urinates depends upon how much liquid they take in and how much liquid they are losing. If the temperature is hot and there is high humidity then the person will sweat more and usually breathe a little more frequently. These factors decrease the amount of frequency of urination that would be considered normal. °The amount you drink is easily determined, but the amount of fluid lost is sometimes more difficult to calculate.  °Fluid is lost in two ways: °· Sensible fluid loss is usually measured by the amount of urine that you get rid of. Losses of fluid can also occur with diarrhea. °· Insensible fluid loss is more difficult to measure. It is caused by evaporation. Insensible loss of fluid occurs through breathing and sweating. It usually ranges from a little less than a quart to a little more than a quart of fluid a day. °In normal temperatures and activity levels the average person may urinate 4 to 7 times in a 24-hour period. Needing to urinate more often than that could indicate a problem. If one urinates 4 to 7 times in 24 hours and has large volumes each time, that could indicate a different problem from one who urinates 4 to 7 times a day and has small volumes. The time of urinating is also an important. Most urinating should be done during the waking hours. Getting up at night to urinate frequently can indicate some problems. °CAUSES  °The bladder is the organ in your lower abdomen that holds urine. Like a balloon, it swells some as it fills up. Your nerves sense this and tell you it is time to head for the bathroom. There are a number of reasons that you might feel the need to urinate more often than usual. They include: °· Urinary tract infection. This is usually associated with other signs such as burning when you urinate. °· In men, problems with the prostate (a walnut-size gland that is located near the tube that carries urine out of your body).  There are two reasons why the prostate can cause an increased frequency of urination: °· An enlarged prostate that does not let the bladder empty well. If the bladder only half empties when you urinate then it only has half the capacity to fill before you have to urinate again. °· The nerves in the bladder become more hypersensitive with an increased size of the prostate even if the bladder empties completely. °· Pregnancy. °· Obesity. Excess weight is more likely to cause a problem for women more than for men. °· Bladder stones or other bladder problems. °· Caffeine. °· Alcohol. °· Medications. For example, drugs that help the body get rid of extra fluid (diuretics) increase urine production. Some other medicines must be taken with lots of fluids. °· Muscle or nerve weakness. This might be the result of a spinal cord injury, a stroke, multiple sclerosis or Parkinson's disease. °· Long-standing diabetes can decrease the sensation of the bladder. This loss of sensation makes it harder to sense the bladder needs to be emptied. Over a period of years the bladder is stretched out by constant overfilling. This weakens the bladder muscles so that the bladder does not empty well and has less capacity to fill with new urine. °· Interstitial cystitis (also called painful bladder syndrome). This condition develops because the tissues that line the insider of the bladder are inflamed (inflammation is the body's way of reacting to injury or infection). It causes pain   and frequent urination. It occurs in women more often than in men. °DIAGNOSIS  °· To decide what might be causing your urinary frequency, your healthcare provider will probably: °· Ask about symptoms you have noticed. °· Ask about your overall health. This will include questions about any medications you are taking. °· Do a physical examination. °· Order some tests. These might include: °· A blood test to check for diabetes or other health issues that could be  contributing to the problem. °· Urine testing. This could measure the flow of urine and the pressure on the bladder. °· A test of your neurological system (the brain, spinal cord and nerves). This is the system that senses the need to urinate. °· A bladder test to check whether it is emptying completely when you urinate. °· Cytoscopy. This test uses a thin tube with a tiny camera on it. It offers a look inside your urethra and bladder to see if there are problems. °· Imaging tests. You might be given a contrast dye and then asked to urinate. X-rays are taken to see how your bladder is working. °TREATMENT  °It is important for you to be evaluated to determine if the amount or frequency that you have is unusual or abnormal. If it is found to be abnormal the cause should be determined and this can usually be found out easily. Depending upon the cause treatment could include medication, stimulation of the nerves, or surgery. °There are not too many things that you can do as an individual to change your urinary frequency. It is important that you balance the amount of fluid intake needed to compensate for your activity and the temperature. Medical problems will be diagnosed and taken care of by your physician. There is no particular bladder training such as Kegel's exercises that you can do to help urinary frequency. This is an exercise this is usually done for people who have leaking of urine when they laugh cough or sneeze. °HOME CARE INSTRUCTIONS  °· Take any medications your healthcare provider prescribed or suggested. Follow the directions carefully. °· Practice any lifestyle changes that are recommended. These might include: °· Drinking less fluid or drinking at different times of the day. If you need to urinate often during the night, for example, you may need to stop drinking fluids early in the evening. °· Cutting down on caffeine or alcohol. They both can make you need to urinate more often than normal.  Caffeine is found in coffee, tea and sodas. °· Losing weight, if that is recommended. °· Keep a journal or a log. You might be asked to record how much you drink and when and when you feel the need to urinate. This will also help evaluate how well the treatment provided by your physician is working. °SEEK MEDICAL CARE IF:  °· Your need to urinate often gets worse. °· You feel increased pain or irritation when you urinate. °· You notice blood in your urine. °· You have questions about any medications that your healthcare provider recommended. °· You notice blood, pus or swelling at the site of any test or treatment procedure. °· You develop a fever of more than 100.5° F (38.1° C). °SEEK IMMEDIATE MEDICAL CARE IF:  °You develop a fever of more than 102.0° F (38.9° C). °Document Released: 03/06/2009 Document Revised: 08/02/2011 Document Reviewed: 03/06/2009 °[employer]® Patient Information ©2014 [employer]. ° °

## 2013-09-08 NOTE — ED Provider Notes (Signed)
CSN: [id number]     Arrival date & time 09/08/13  2036 History   First MD Initiated Contact with Patient 09/08/13 2140     Chief Complaint  Patient presents with  . Urinary Frequency  . Flank Pain  . Diarrhea     (Consider location/radiation/quality/duration/timing/severity/associated sxs/prior Treatment) HPI Comments: Pt seen here 3 weeks ago and ddx with UTI and given keflex for 14 days.  CBC, BmP were wnl at that time.  Patient is a 38 y.o. female presenting with frequency, flank pain, and diarrhea. The history is provided by the patient.  Urinary Frequency This is a new problem. Episode onset: 3-4 days. The problem occurs constantly. The problem has not changed since onset.Associated symptoms comments: Intermittent left flank pain with urinary frequency and urgency without dysuria or foul smelling urine.  Denies fever, vomiting but state occassional nausea and occasional diarrhea.. Nothing aggravates the symptoms. Nothing relieves the symptoms. She has tried nothing for the symptoms. The treatment provided no relief.  Flank Pain  Diarrhea   Past Medical History  Diagnosis Date  . Depression   . Ovarian failure   . Sleep apnea   . Anxiety   . UTI (lower urinary tract infection)    Past Surgical History  Procedure Laterality Date  . Cholecystectomy  2010  . Cholecystectomy    . Wisdom tooth extraction     Family History  Problem Relation Age of Onset  . Allergies Sister   . Heart disease Paternal Grandmother   . Cancer Paternal Grandmother     BREAST CANCER  . Cancer Paternal Grandfather    History  Substance Use Topics  . Smoking status: Former Smoker    Types: Cigarettes  . Smokeless tobacco: Never Used     Comment: Social smoker x 12-14 years. Still smokes on and off currently  . Alcohol Use: Yes     Comment: occasionally   OB History   Grav Para Term Preterm Abortions TAB SAB Ect Mult Living                 Review of Systems  Gastrointestinal: Positive  for diarrhea.  Genitourinary: Positive for frequency and flank pain.  All other systems reviewed and are negative.     Allergies  Sulfa antibiotics  Home Medications   Prior to Admission medications   Medication Sig Start Date End Date Taking? Authorizing Provider  acetaminophen (TYLENOL) 325 MG tablet Take 650 mg by mouth every 6 (six) hours as needed for mild pain or headache (pain).     Historical Provider, MD  cephALEXin (KEFLEX) 500 MG capsule Take 1 capsule (500 mg total) by mouth 3 (three) times daily. 08/21/13   Garlon HatchetLisa M Sanders, PA-C  cetirizine (ZYRTEC) 10 MG tablet Take 10 mg by mouth at bedtime.     Historical Provider, MD  hydrOXYzine (ATARAX/VISTARIL) 10 MG tablet Take 10 mg by mouth at bedtime.    Historical Provider, MD  Nutritional Supplements (BEE POLLEN/ROYAL JELLY/HONEY PO) Take 1 tablet by mouth daily.    Historical Provider, MD  RA GARLIC PO Take 119120 mg by mouth daily.    Historical Provider, MD  sertraline (ZOLOFT) 100 MG tablet Take 100 mg by mouth daily.    Historical Provider, MD  ZINC-MAGNESIUM ASPART-VIT B6 PO Take 1 tablet by mouth daily.    Historical Provider, MD   BP 134/84  Pulse 78  Temp(Src) 98.8 F (37.1 C) (Oral)  Resp 18  Ht 5\' 9"  (1.753 m)  Wt  305 lb (138.347 kg)  BMI 45.02 kg/m2  SpO2 98% Physical Exam  Nursing note and vitals reviewed. Constitutional: She is oriented to person, place, and time. She appears well-developed and well-nourished. No distress.  obese  HENT:  Head: Normocephalic and atraumatic.  Mouth/Throat: Oropharynx is clear and moist.  Eyes: Conjunctivae and EOM are normal. Pupils are equal, round, and reactive to light.  Neck: Normal range of motion. Neck supple.  Cardiovascular: Normal rate, regular rhythm and intact distal pulses.   No murmur heard. Pulmonary/Chest: Effort normal and breath sounds normal. No respiratory distress. She has no wheezes. She has no rales.  Abdominal: Soft. Normal appearance. She exhibits no  distension. There is tenderness in the suprapubic area. There is CVA tenderness. There is no rebound and no guarding.  Mild Left CVA and suprapubic tenderness  Musculoskeletal: Normal range of motion. She exhibits no edema and no tenderness.  Neurological: She is alert and oriented to person, place, and time.  Skin: Skin is warm and dry. No rash noted. No erythema.  Psychiatric: She has a normal mood and affect. Her behavior is normal.    ED Course  Procedures (including critical care time) Labs Review Labs Reviewed  URINALYSIS, ROUTINE W REFLEX MICROSCOPIC - Abnormal; Notable for the following:    APPearance CLOUDY (*)    Leukocytes, UA SMALL (*)    All other components within normal limits  URINE MICROSCOPIC-ADD ON - Abnormal; Notable for the following:    Squamous Epithelial / LPF MANY (*)    Bacteria, UA FEW (*)    All other components within normal limits    Imaging Review No results found.   EKG Interpretation None      MDM   Final diagnoses:  None    Patient is here for urinary frequency and urgency and has been ongoing for the last few days with occasional left flank pain. She was recently treated for a UTI 3 weeks ago complete a two-week course of Keflex and felt that symptoms improved and are now returning. Patient denies history of kidney stones and flank pain is intermittent but not constant. UA today was contaminated but does not show obvious evidence of a urinary tract infection. Culture will be sent. Labs 3 weeks ago showing a normal creatinine and hemoglobin count. Bedside ultrasound today shows no sign of urinary retention and ultrasound of the kidneys show no sign of hydronephrosis swelling or free fluid. Will treat patient with bladder spasm medication and have her follow up with urology. If cultures come back positive we'll then treat with antibiotics but at this time do not feel that patient needs antibiotics.    Gwyneth SproutWhitney Vanna Sailer, MD 09/08/13 2217

## 2013-09-08 NOTE — ED Notes (Signed)
Pt c/o urinary frequency/retention last few days with L flank pain. Pt states she was recently tx for UTI. Pt also c/o diarrhea onset last 2 days.

## 2013-09-10 LAB — URINE CULTURE: Colony Count: 55000

## 2013-09-18 ENCOUNTER — Ambulatory Visit: Payer: Self-pay | Admitting: Dietician

## 2013-10-05 ENCOUNTER — Emergency Department (HOSPITAL_COMMUNITY)
Admission: EM | Admit: 2013-10-05 | Discharge: 2013-10-05 | Disposition: A | Discharge status: Home / Self Care | Payer: Self-pay | Attending: Emergency Medicine | Admitting: Emergency Medicine

## 2013-10-05 ENCOUNTER — Encounter (HOSPITAL_COMMUNITY): Payer: Self-pay | Admitting: Emergency Medicine

## 2013-10-05 DIAGNOSIS — Z87891 Personal history of nicotine dependence: Secondary | ICD-10-CM | POA: Insufficient documentation

## 2013-10-05 DIAGNOSIS — F3289 Other specified depressive episodes: Secondary | ICD-10-CM | POA: Insufficient documentation

## 2013-10-05 DIAGNOSIS — Z8742 Personal history of other diseases of the female genital tract: Secondary | ICD-10-CM | POA: Insufficient documentation

## 2013-10-05 DIAGNOSIS — Z87440 Personal history of urinary (tract) infections: Secondary | ICD-10-CM | POA: Insufficient documentation

## 2013-10-05 DIAGNOSIS — J069 Acute upper respiratory infection, unspecified: Secondary | ICD-10-CM | POA: Insufficient documentation

## 2013-10-05 DIAGNOSIS — F411 Generalized anxiety disorder: Secondary | ICD-10-CM | POA: Insufficient documentation

## 2013-10-05 DIAGNOSIS — Z792 Long term (current) use of antibiotics: Secondary | ICD-10-CM | POA: Insufficient documentation

## 2013-10-05 DIAGNOSIS — F329 Major depressive disorder, single episode, unspecified: Secondary | ICD-10-CM | POA: Insufficient documentation

## 2013-10-05 DIAGNOSIS — Z79899 Other long term (current) drug therapy: Secondary | ICD-10-CM | POA: Insufficient documentation

## 2013-10-05 MED ORDER — OXYMETAZOLINE HCL 0.05 % NA SOLN: 1.0000 | Freq: Two times a day (BID) | NASAL | Status: DC

## 2013-10-05 NOTE — ED Notes (Signed)
Pt arrived to the ED with a complaint of a cough, possible sinus infection, low gerade fever, weakness for a week.  Pt has been taking allergy medications with no relief.  Cough has been present the entire time and has progressed to a point that it causes the pt pain.

## 2013-10-05 NOTE — Discharge Instructions (Signed)
It is common to have body aches, sore throat post nasal drip causing a cough and slight cough with the common cold  Make sure to drink plenty of water, rest   Emergency Department Resource Guide 1) Find a Doctor and Pay Out of Pocket Although you won't have to find out who is covered by your insurance plan, it is a good idea to ask around and get recommendations. You will then need to call the office and see if the doctor you have chosen will accept you as a new patient and what types of options they offer for patients who are self-pay. Some doctors offer discounts or will set up payment plans for their patients who do not have insurance, but you will need to ask so you aren't surprised when you get to your appointment.  2) Contact Your Local Health Department Not all health departments have doctors that can see patients for sick visits, but many do, so it is worth a call to see if yours does. If you don't know where your local health department is, you can check in your phone book. The CDC also has a tool to help you locate your state's health department, and many state websites also have listings of all of their local health departments.  3) Find a Walk-in Clinic If your illness is not likely to be very severe or complicated, you may want to try a walk in clinic. These are popping up all over the country in pharmacies, drugstores, and shopping centers. They're usually staffed by nurse practitioners or physician assistants that have been trained to treat common illnesses and complaints. They're usually fairly quick and inexpensive. However, if you have serious medical issues or chronic medical problems, these are probably not your best option.  No Primary Care Doctor: - Call Health Connect at  [phone] - they can help you locate a primary care doctor that  accepts your insurance, provides certain services, etc. - Physician Referral Service- [phone]  Chronic Pain Problems: Organization          Address  Phone   Notes  [facility]  [phone]) [phone] Patients need to be referred by their primary care doctor.   Medication Assistance: Organization         Address  Phone   Notes  [facility] Medication [facility] [address] [place], [place] [address] [phone] --Must be a resident of [place] [facility] [place] -- Must have NO insurance coverage whatsoever (no Medicaid/ Medicare, etc.) -- The pt. MUST have a primary care doctor that directs their care regularly and follows them in the community   MedAssist  [phone]) [phone]   [employer]  [phone]) [phone]    Agencies that provide inexpensive medical care: Organization         Address  Phone   Notes  [facility]  [phone]   [facility]    [phone]   [facility] [address] [place], [place] [address] [phone]   [facility] [address], [place] [phone]) [phone]   [facility]anned Parenthood    [phone]) [phone]   [facility]    [phone]) [phone]   [facility]  [address], [place] Phone:  [phone], Fax:  [phone] Hours of Operation:  9 am - 6 pm, M-F.  Also accepts Medicaid/Medicare and self-pay.  [facility]  [address], [place] Phone: [phone], Fax: [phone]. Hours of Operation:  8:30 am - 5:30 pm, M-F.  Also accepts Medicaid and self-pay.  [facility] [place] [address], [place] Phone: [phone]   [place], [place], [place] [phone], Ext. [phone] Mondays & Thursdays: 7-9 AM.  First 15 patients are seen on a first come, first serve basis.    [place] Providers:  Organization         Address  Phone   Notes  [facility] [address], [place] [phone] Also accepts self-pay patients.  [facility] [id number] [place], [place]  [phone]   [place], [address], [place] [phone]   [facility] [address] [place] [phone]   Lucianne Lei [address], [place]   [phone] Only accepts [place] Access [place] patients after they have their name applied to their card.   Self-Pay (no insurance) in [facility]:  Organization         Address  Phone   Notes  Sickle Cell Patients, [facility] [place] [phone]   [facility] [place] [phone]   [facility] [place]  [place], [place], [place] [phone]   [facility]/Dr. Osei-Bonsu  [address] [place] or [address], [place] [phone] Phone number for both [place] and [place] locations is the same.  [facility] [address] [place] [phone]   [facility] [address], [place] or [address] [phone]   [facility] [address], [place] [phone], phone; [phone], fax Sees patients 1st and 3rd Saturday of every month.  Must not qualify for public or private insurance (i.e. Medicaid, Medicare, [place] Health Choice, Veterans' Benefits)  Household income should be no more than 200% of the poverty level The clinic cannot treat you if you are pregnant or think you are pregnant  Sexually transmitted diseases are not treated at the clinic.    Dental Care: Organization         Address  Phone  Notes  [facility] Department of [facility] [place] [phone] Accepts children up to age 34 who are enrolled in [place] or [place]; pregnant women with a Medicaid card; and  children who have applied for Medicaid or [place] Health Choice, but were declined, whose parents can pay a reduced fee at time of service.  [facility] Department of [facility]  [address], [place] [phone] Accepts children up to age 36 who are enrolled in [place] or [place]; pregnant women with a Medicaid card; and children who have applied for Medicaid or [place] Health Choice, but were declined, whose parents can pay a reduced fee at time of service.  [place] [facility] Access PROGRAM  [place] [phone] Patients are seen by appointment only. Walk-ins are not accepted. [place] will see patients 57 years of age and older. Monday - Tuesday (8am-5pm) Most Wednesdays (8:30-5pm) $30 per visit, cash only  [place]  Access PROGRAM  [address], [facility] [phone] Patients are seen by appointment only. Walk-ins are not accepted. [place] will see patients 55 years of age and older. One Wednesday Evening (Monthly: Volunteer Based).  $30 per visit, cash only  [place]  [phone] for adults; Children under age 50, call Graduate Pediatric Dentistry at [phone]. Children aged 59-14, please call [phone] to request a pediatric application.  Dental services are provided in all areas of dental care including fillings, crowns and bridges, complete and partial dentures, implants, gum treatment, root canals, and extractions. Preventive care is also provided. Treatment is provided to both adults and children. Patients are selected via a lottery and there is often a waiting list.   [facility] [address] [place]  [phone] [email or web address]   [facility] [address] [place], [place] [phone], Ext. [phone] Second and Fourth Thursday of each month, opens at 6:30 AM; Clinic ends at 9 AM.  Patients are seen on a first-come first-served  basis, and a limited number are seen during each clinic.   [facility]  [address] Hillard Danker [place], [place] [phone]   Eligibility Requirements You must have lived in [place], [place], or [place] counties for at least the last three months.   You cannot be eligible for state or federal sponsored [employer], including [employer], [place], or [employer].   You generally cannot be eligible for healthcare insurance through your employer.    How to apply: Eligibility screenings are held every Tuesday and Wednesday afternoon from 1:00 pm until 4:00 pm. You do not need an appointment for the interview!  [facility] [address], [place], [place]   [place] Department  [place]  [phone]    Behavioral Health Resources in the Community: Intensive Outpatient Programs Organization         Address  Phone  Notes  Sully Kearns. [address], [place], [place] [phone]   [facility] Outpatient [address], [place], [place]   ADS: Alcohol [facility] Drug Svcs [address] [place], [place]   [place] [address],  [place], [place] or [phone]   Substance Abuse Resources Organization         Address  Phone  Notes  Alcohol and Drug Services  [phone]   [place]  [phone]   [facility] [place]   Chinita Pester  [phone]   Residential & Outpatient Substance Abuse Program  [phone]   Psychological Services Organization         Address  Phone  Notes  [facility]  [place]  [phone]   [place] [address], [place] [phone] or [phone]    Mobile Crisis Teams Organization          Address  Phone  Notes  [facility], Mobile Crisis Care Unit  [phone]   [facility]  [address] [place], [place]   [place] Levels [address] [place] [phone]    Self-Help/Support Groups Organization         Address  Phone             Notes  Mental Health  Assoc. of [place] - variety of support groups  336- [phone] Call for more information  Narcotics Anonymous (NA), Caring [facility] [address], [place]  2 meetings at this location   Statisticianesidential Treatment Programs Organization         Address  Phone  Notes  [facility] [address],    [place]  [phone]   [facility]  [address], [place] [phone], [place], [place] [phone]   [facility] [address], [place] [phone] Admissions: 8am-3pm M-F  [facility] [address]    [place], [place] [phone]   [facility] [address], [place], [place] [phone]   [facility] [address]  [place], [place] [phone]   [facility] - Intensive Outpatient [address], [place], [place] [phone]   [facility] ([facility]iction Recovery [facility] [address]  [place], [place] [phone] or [phone]   [facility] (RTS) [address] [place], [place] [phone] Accepts Medicaid  [facility] [address]  [place] [phone] Substance Abuse/Addiction Treatment   [facility] Behavioral Health Resources Organization         Address  Phone  Notes  [facility]  [phone](888) [phone]   Angie FavaJulie Brannon, PhD [address], [address] [place], [place]   [phone] or [phone]) [phone]   [facility]   [address] [place], [place] [phone]) [phone]   [facility] [address], [place], [place] [phone]) [phone] Insurance/Medicaid/sponsorship  through [facility]  Faith and Families [address]                                    [place], [place] [phone](336) [phone] Therapy/tele-psych/case  [facility] [address]   [place], [place] [phone](336) [phone]    Dr. Lolly MustacheArfeen  [phone](336) [phone]   [facility] [place] [place]  [employer] [facility] 1) [address], [place] 2) [address], [place] 3)  [address], [place] [phone](336) [phone]) [phone]  [phone](336) [phone]   [facility] Child Abuse Hotline [phone](336) [phone] or [phone](336) [phone] (After Hours)

## 2013-10-05 NOTE — ED Provider Notes (Signed)
Medical screening examination/treatment/procedure(s) were performed by non-physician practitioner and as supervising physician I was immediately available for consultation/collaboration.   EKG Interpretation None       Zamariah Seaborn R. Karlita Lichtman, MD 10/05/13 2337 

## 2013-10-05 NOTE — ED Provider Notes (Signed)
CSN: [id number]     Arrival date & time 10/05/13  2006 History  This chart was scribed for non-physician practitioner, Earley FavorGail Finnean Cerami, FNP,working with Juliet RudeNathan R. Rubin PayorPickering, MD, by Karle PlumberJennifer Tensley, ED Scribe.  This patient was seen in room WTR8/WTR8 and the patient's care was started at 8:35 PM.  Chief Complaint  Patient presents with  . Cough  . Weakness   The history is provided by the patient. No language interpreter was used.   HPI Comments:  Catherine Koch is a 38 y.o. obese female who presents to the Emergency Department complaining of cough, sore throat, hoarseness, and sneezing that started approximately one week ago. Pt reports generalized body aches and low grade fever Tmax 99.5 degrees. Pt reports taking allergy medication without relief. She states she has been taking Ibuprofen for the pain with mild relief. She denies nausea or vomiting. She reports h/o bladder spasms. She states she no longer has a PCP.    Past Medical History  Diagnosis Date  . Depression   . Ovarian failure   . Sleep apnea   . Anxiety   . UTI (lower urinary tract infection)    Past Surgical History  Procedure Laterality Date  . Cholecystectomy  2010  . Cholecystectomy    . Wisdom tooth extraction     Family History  Problem Relation Age of Onset  . Allergies Sister   . Heart disease Paternal Grandmother   . Cancer Paternal Grandmother     BREAST CANCER  . Cancer Paternal Grandfather    History  Substance Use Topics  . Smoking status: Former Smoker    Types: Cigarettes  . Smokeless tobacco: Never Used     Comment: Social smoker x 12-14 years. Still smokes on and off currently  . Alcohol Use: Yes     Comment: occasionally   OB History   Grav Para Term Preterm Abortions TAB SAB Ect Mult Living                 Review of Systems  Respiratory: Positive for cough.   All other systems reviewed and are negative.   Allergies  Sulfa antibiotics  Home Medications   Prior to Admission  medications   Medication Sig Start Date End Date Taking? Authorizing Provider  acetaminophen (TYLENOL) 325 MG tablet Take 650 mg by mouth every 6 (six) hours as needed for mild pain or headache (pain).     Historical Provider, MD  cephALEXin (KEFLEX) 500 MG capsule Take 1 capsule (500 mg total) by mouth 3 (three) times daily. 08/21/13   Garlon HatchetLisa M Sanders, PA-C  cetirizine (ZYRTEC) 10 MG tablet Take 10 mg by mouth at bedtime.     Historical Provider, MD  hydrOXYzine (ATARAX/VISTARIL) 10 MG tablet Take 10 mg by mouth at bedtime.    Historical Provider, MD  Nutritional Supplements (BEE POLLEN/ROYAL JELLY/HONEY PO) Take 1 tablet by mouth daily.    Historical Provider, MD  RA GARLIC PO Take 409120 mg by mouth daily.    Historical Provider, MD  sertraline (ZOLOFT) 100 MG tablet Take 100 mg by mouth daily.    Historical Provider, MD  tolterodine (DETROL) 1 MG tablet Take 1 tablet (1 mg total) by mouth 2 (two) times daily. 09/08/13   Gwyneth SproutWhitney Plunkett, MD  ZINC-MAGNESIUM ASPART-VIT B6 PO Take 1 tablet by mouth daily.    Historical Provider, MD   Triage Vitals: BP 132/87  Pulse 95  Temp(Src) 99.5 F (37.5 C) (Oral)  Resp 16  Ht 5\' 9"  (  1.753 m)  Wt 300 lb (136.079 kg)  BMI 44.28 kg/m2  SpO2 99% Physical Exam  Nursing note and vitals reviewed. Constitutional: She is oriented to person, place, and time. She appears well-developed and well-nourished.  HENT:  Head: Normocephalic and atraumatic.  Right Ear: Tympanic membrane normal.  Left Ear: Tympanic membrane normal.  Mouth/Throat: Uvula is midline, oropharynx is clear and moist and mucous membranes are normal. No uvula swelling.  Eyes: EOM are normal.  Neck: Normal range of motion.  Cardiovascular: Normal rate.   Pulmonary/Chest: Effort normal and breath sounds normal. No respiratory distress. She has no wheezes. She has no rales.  Musculoskeletal: Normal range of motion.  Neurological: She is alert and oriented to person, place, and time.  Skin: Skin  is warm and dry.  Psychiatric: She has a normal mood and affect. Her behavior is normal.    ED Course  Procedures (including critical care time) DIAGNOSTIC STUDIES: Oxygen Saturation is 99% on RA, normal by my interpretation.   COORDINATION OF CARE: 8:39 PM- Informed pt that her symptoms were most likely viral. Pt verbalizes understanding and agrees to plan.  Medications - No data to display  Labs Review Labs Reviewed - No data to display  Imaging Review No results found.   EKG Interpretation None      MDM   Final diagnoses:  URI (upper respiratory infection)     I personally performed the services described in this documentation, which was scribed in my presence. The recorded information has been reviewed and is accurate.    Arman FilterGail K Madigan Rosen[place], NP 10/05/13 [phone]

## 2013-10-27 ENCOUNTER — Encounter (HOSPITAL_COMMUNITY): Payer: Self-pay | Admitting: Emergency Medicine

## 2013-10-27 ENCOUNTER — Emergency Department (HOSPITAL_COMMUNITY)
Admission: EM | Admit: 2013-10-27 | Discharge: 2013-10-27 | Disposition: A | Discharge status: Home / Self Care | Payer: Self-pay | Attending: Emergency Medicine | Admitting: Emergency Medicine

## 2013-10-27 DIAGNOSIS — J029 Acute pharyngitis, unspecified: Secondary | ICD-10-CM | POA: Insufficient documentation

## 2013-10-27 DIAGNOSIS — F3289 Other specified depressive episodes: Secondary | ICD-10-CM | POA: Insufficient documentation

## 2013-10-27 DIAGNOSIS — F329 Major depressive disorder, single episode, unspecified: Secondary | ICD-10-CM | POA: Insufficient documentation

## 2013-10-27 DIAGNOSIS — Z87440 Personal history of urinary (tract) infections: Secondary | ICD-10-CM | POA: Insufficient documentation

## 2013-10-27 DIAGNOSIS — Z79899 Other long term (current) drug therapy: Secondary | ICD-10-CM | POA: Insufficient documentation

## 2013-10-27 DIAGNOSIS — Z87891 Personal history of nicotine dependence: Secondary | ICD-10-CM | POA: Insufficient documentation

## 2013-10-27 DIAGNOSIS — F411 Generalized anxiety disorder: Secondary | ICD-10-CM | POA: Insufficient documentation

## 2013-10-27 DIAGNOSIS — Z8742 Personal history of other diseases of the female genital tract: Secondary | ICD-10-CM | POA: Insufficient documentation

## 2013-10-27 LAB — RAPID STREP SCREEN (MED CTR MEBANE ONLY): Streptococcus, Group A Screen (Direct): NEGATIVE

## 2013-10-27 MED ORDER — ACETAMINOPHEN-CODEINE 120-12 MG/5ML PO SOLN: 10.0000 mL | ORAL | Status: DC | PRN

## 2013-10-27 MED ORDER — PREDNISONE 50 MG PO TABS: 50.0000 mg | ORAL_TABLET | Freq: Every day | ORAL | Status: DC

## 2013-10-27 MED ORDER — AMOXICILLIN-POT CLAVULANATE 875-125 MG PO TABS: 1.0000 | ORAL_TABLET | Freq: Two times a day (BID) | ORAL | Status: DC

## 2013-10-27 NOTE — Discharge Instructions (Signed)
Return here as needed.  Follow-up with a primary care doctor.  Increase your fluid intake. °

## 2013-10-27 NOTE — ED Notes (Signed)
Pt states she has had sore throat for 3 days and her body aches,  And her ears hurt,  States she  "picks off yellow hard stuff from her throat"

## 2013-10-27 NOTE — ED Provider Notes (Signed)
CSN: [id number]     Arrival date & time 10/27/13  1927 History   First MD Initiated Contact with Patient 10/27/13 2011     This chart was scribed for non-physician practitioner, Ebbie Ridgehris Sibley Rolison, PA, working with Hurman HornJohn M Bednar, MD by Marica OtterNusrat Rahman, ED Scribe. This patient was seen in room WTR6/WTR6 and the patient's care was started at 8:17 PM.  PCP: No PCP Per Patient  Chief Complaint  Patient presents with  . Sore Throat    The history is provided by the patient. No language interpreter was used.   HPI Comments: Catherine Koch is a 38 y.o. female, with a history of sleep apnea, anxiety and depression, who presents to the Emergency Department complaining of intermittent sore throat with associated body aches and ear congestion onset 3 days ago. Pt also complains of associated chills, intermittent cough, rhinorrhea in the morning. Pt reports that she had "yellow stuff" in her throat that she has to remove with a toothpick each morning.  Patient denies fever, nausea, vomiting, diarrhea, weakness, dizziness, headache, blurred vision, neck pain, back pain, cough, or syncope.  The patient, states, that she did not take any medications prior to arrival other than her prescribed medications  Past Medical History  Diagnosis Date  . Depression   . Ovarian failure   . Sleep apnea   . Anxiety   . UTI (lower urinary tract infection)    Past Surgical History  Procedure Laterality Date  . Cholecystectomy  2010  . Cholecystectomy    . Wisdom tooth extraction     Family History  Problem Relation Age of Onset  . Allergies Sister   . Heart disease Paternal Grandmother   . Cancer Paternal Grandmother     BREAST CANCER  . Cancer Paternal Grandfather    History  Substance Use Topics  . Smoking status: Former Smoker    Types: Cigarettes  . Smokeless tobacco: Never Used     Comment: Social smoker x 12-14 years. Still smokes on and off currently  . Alcohol Use: Yes     Comment: occasionally   OB  History   Grav Para Term Preterm Abortions TAB SAB Ect Mult Living                 Review of Systems  A complete 10 system review of systems was obtained and all systems are negative except as noted in the HPI and PMH.    Allergies  Sulfa antibiotics  Home Medications   Prior to Admission medications   Medication Sig Start Date End Date Taking? Authorizing Provider  acetaminophen (TYLENOL) 325 MG tablet Take 650 mg by mouth every 6 (six) hours as needed for mild pain or headache (pain).     Historical Provider, MD  cetirizine (ZYRTEC) 10 MG tablet Take 10 mg by mouth at bedtime.     Historical Provider, MD  Nutritional Supplements (BEE POLLEN/ROYAL JELLY/HONEY PO) Take 1 tablet by mouth daily.    Historical Provider, MD  oxymetazoline (AFRIN NASAL SPRAY) 0.05 % nasal spray Place 1 spray into both nostrils 2 (two) times daily. 10/05/13   Arman FilterGail K Schulz, NP  sertraline (ZOLOFT) 100 MG tablet Take 100 mg by mouth daily.    Historical Provider, MD  tolterodine (DETROL) 1 MG tablet Take 1 tablet (1 mg total) by mouth 2 (two) times daily. 09/08/13   Gwyneth SproutWhitney Plunkett, MD  tolterodine (DETROL) 1 MG tablet Take 1 mg by mouth daily.    Historical Provider, MD  Triage Vitals: BP 114/75  Pulse 90  Temp(Src) 98.8 F (37.1 C) (Oral)  Resp 20  SpO2 100%  LMP 06/29/2013 Physical Exam  Nursing note and vitals reviewed. Constitutional: She is oriented to person, place, and time. She appears well-developed and well-nourished. No distress.  HENT:  Head: Normocephalic and atraumatic.  Mouth/Throat: Uvula is midline and mucous membranes are normal. No trismus in the jaw. Normal dentition. No uvula swelling. Posterior oropharyngeal edema and posterior oropharyngeal erythema present. No oropharyngeal exudate or tonsillar abscesses.  Eyes: Pupils are equal, round, and reactive to light.  Neck: Normal range of motion. Neck supple. No tracheal deviation present.  Cardiovascular: Normal rate, regular  rhythm and normal heart sounds.  Exam reveals no friction rub.   No murmur heard. Pulmonary/Chest: Effort normal and breath sounds normal. No respiratory distress.  Musculoskeletal: Normal range of motion.  Neurological: She is alert and oriented to person, place, and time.  Skin: Skin is warm and dry.  Psychiatric: She has a normal mood and affect. Her behavior is normal.    ED Course  Procedures (including critical care time) DIAGNOSTIC STUDIES: Oxygen Saturation is 100% on RA, normal by my interpretation.    COORDINATION OF CARE: 8:19 PM-Discussed treatment plan which includes strep screen with pt. Patient verbalizes understanding and agrees with treatment plan.  Labs Review Labs Reviewed  RAPID STREP SCREEN  CULTURE, GROUP A STREP    Patient is advised to return here as needed.  Told to increase her fluid intake, rest as much possible.  The patient has a pharyngitis based on her physical exam findings.  He is also advised followup with her primary care Dr. for further evaluation and recheck.  Patient is given the results of the tests.  All questions were answered and the patient.  Voices an understanding of the plan  Carlyle Dolly, [place] 10/28/13 [phone]

## 2013-10-28 NOTE — ED Provider Notes (Signed)
Medical screening examination/treatment/procedure(s) were performed by non-physician practitioner and as supervising physician I was immediately available for consultation/collaboration.   EKG Interpretation None       Hurman Horn, MD 10/28/13 1438

## 2013-10-29 LAB — CULTURE, GROUP A STREP

## 2013-11-10 ENCOUNTER — Emergency Department (HOSPITAL_COMMUNITY): Payer: Self-pay

## 2013-11-10 ENCOUNTER — Encounter (HOSPITAL_COMMUNITY): Payer: Self-pay | Admitting: Emergency Medicine

## 2013-11-10 ENCOUNTER — Emergency Department (HOSPITAL_COMMUNITY)
Admission: EM | Admit: 2013-11-10 | Discharge: 2013-11-10 | Disposition: A | Discharge status: Home / Self Care | Payer: Self-pay | Attending: Emergency Medicine | Admitting: Emergency Medicine

## 2013-11-10 DIAGNOSIS — S8261XA Displaced fracture of lateral malleolus of right fibula, initial encounter for closed fracture: Secondary | ICD-10-CM

## 2013-11-10 DIAGNOSIS — F411 Generalized anxiety disorder: Secondary | ICD-10-CM | POA: Insufficient documentation

## 2013-11-10 DIAGNOSIS — Y929 Unspecified place or not applicable: Secondary | ICD-10-CM | POA: Insufficient documentation

## 2013-11-10 DIAGNOSIS — Y9389 Activity, other specified: Secondary | ICD-10-CM | POA: Insufficient documentation

## 2013-11-10 DIAGNOSIS — F329 Major depressive disorder, single episode, unspecified: Secondary | ICD-10-CM | POA: Insufficient documentation

## 2013-11-10 DIAGNOSIS — Z87440 Personal history of urinary (tract) infections: Secondary | ICD-10-CM | POA: Insufficient documentation

## 2013-11-10 DIAGNOSIS — X500XXA Overexertion from strenuous movement or load, initial encounter: Secondary | ICD-10-CM | POA: Insufficient documentation

## 2013-11-10 DIAGNOSIS — Z79899 Other long term (current) drug therapy: Secondary | ICD-10-CM | POA: Insufficient documentation

## 2013-11-10 DIAGNOSIS — W010XXA Fall on same level from slipping, tripping and stumbling without subsequent striking against object, initial encounter: Secondary | ICD-10-CM | POA: Insufficient documentation

## 2013-11-10 DIAGNOSIS — Z862 Personal history of diseases of the blood and blood-forming organs and certain disorders involving the immune mechanism: Secondary | ICD-10-CM | POA: Insufficient documentation

## 2013-11-10 DIAGNOSIS — S8263XA Displaced fracture of lateral malleolus of unspecified fibula, initial encounter for closed fracture: Secondary | ICD-10-CM | POA: Insufficient documentation

## 2013-11-10 DIAGNOSIS — Z87891 Personal history of nicotine dependence: Secondary | ICD-10-CM | POA: Insufficient documentation

## 2013-11-10 DIAGNOSIS — F3289 Other specified depressive episodes: Secondary | ICD-10-CM | POA: Insufficient documentation

## 2013-11-10 DIAGNOSIS — Z8639 Personal history of other endocrine, nutritional and metabolic disease: Secondary | ICD-10-CM | POA: Insufficient documentation

## 2013-11-10 MED ORDER — OXYCODONE-ACETAMINOPHEN 5-325 MG PO TABS: 1.0000 | ORAL_TABLET | ORAL | Status: DC | PRN

## 2013-11-10 MED ORDER — OXYCODONE-ACETAMINOPHEN 5-325 MG PO TABS
1.0000 | ORAL_TABLET | Freq: Once | ORAL | Status: AC
  Administered 2013-11-10: 1 via ORAL
  Filled 2013-11-10: qty 1

## 2013-11-10 NOTE — ED Provider Notes (Signed)
CSN: [id number]     Arrival date & time 11/10/13  2000 History   First MD Initiated Contact with Patient 11/10/13 2001     Chief Complaint  Patient presents with  . Ankle Pain     (Consider location/radiation/quality/duration/timing/severity/associated sxs/prior Treatment) HPI  Patient reports she accidentally slipped in the mud, inverted her right ankle, fell on ground.  Denies hitting head or LOC.  Has pain and swelling in her right lateral ankle.  Has not been able to bear weight since the fall.  Pain is 8/10 when still, 10/10 with movement.  Pain is constant and radiates into lower leg. Denies weakness or numbness of the leg.  Denies other injury.    Past Medical History  Diagnosis Date  . Depression   . Ovarian failure   . Sleep apnea   . Anxiety   . UTI (lower urinary tract infection)    Past Surgical History  Procedure Laterality Date  . Cholecystectomy  2010  . Cholecystectomy    . Wisdom tooth extraction     Family History  Problem Relation Age of Onset  . Allergies Sister   . Heart disease Paternal Grandmother   . Cancer Paternal Grandmother     BREAST CANCER  . Cancer Paternal Grandfather    History  Substance Use Topics  . Smoking status: Former Smoker    Types: Cigarettes  . Smokeless tobacco: Never Used     Comment: Social smoker x 12-14 years. Still smokes on and off currently  . Alcohol Use: Yes     Comment: occasionally   OB History   Grav Para Term Preterm Abortions TAB SAB Ect Mult Living                 Review of Systems  All other systems reviewed and are negative.     Allergies  Sulfa antibiotics  Home Medications   Prior to Admission medications   Medication Sig Start Date End Date Taking? Authorizing Provider  cetirizine (ZYRTEC) 10 MG tablet Take 10 mg by mouth at bedtime.    Yes Historical Provider, MD  hydrOXYzine (ATARAX/VISTARIL) 25 MG tablet Take 25 mg by mouth 3 (three) times daily as needed for anxiety.   Yes Historical  Provider, MD  Multiple Vitamin (MULTIVITAMIN WITH MINERALS) TABS tablet Take 1 tablet by mouth every morning.   Yes Historical Provider, MD  Nutritional Supplements (BEE POLLEN/ROYAL JELLY/HONEY PO) Take 1 tablet by mouth every morning.    Yes Historical Provider, MD  sertraline (ZOLOFT) 50 MG tablet Take 50 mg by mouth every morning.   Yes Historical Provider, MD   BP 127/97  Pulse 96  Temp(Src) 99.4 F (37.4 C)  Resp 16  SpO2 98%  LMP 06/29/2013 Physical Exam  Nursing note and vitals reviewed. Constitutional: She appears well-developed and well-nourished. No distress.  HENT:  Head: Normocephalic and atraumatic.  Neck: Neck supple.  Pulmonary/Chest: Effort normal.  Musculoskeletal:       Right knee: Normal.       Right ankle: She exhibits decreased range of motion and swelling. She exhibits no ecchymosis, no deformity, no laceration and normal pulse. Tenderness. Lateral malleolus tenderness found.       Right lower leg: Normal.       Right foot: Normal.       Feet:  Neurological: She is alert.  Skin: She is not diaphoretic.    ED Course  Procedures (including critical care time) Labs Review Labs Reviewed - No data to  display  Imaging Review Dg Ankle Complete Right  11/10/2013   CLINICAL DATA:  Ankle injury and pain.  EXAM: RIGHT ANKLE - COMPLETE 3+ VIEW  COMPARISON:  None  FINDINGS: An oblique fracture of the distal fibula is is noted.  Soft tissue swelling is present.  There is no evidence of dislocation.  A joint effusion is noted.  The talus is unremarkable.  IMPRESSION: Oblique fracture of the distal fibula.   Electronically Signed   By: Laveda AbbeJeff  Hu M.D.   On: 11/10/2013 20:54     EKG Interpretation None      MDM   Final diagnoses:  Fractured lateral malleolus, right, closed, initial encounter   Pt with accidental slip and fall with fracture to lateral malleolus.  No other injury.  Neurovascularly intact.  Placed in splint, d/c with crutches, pain medication, ortho  follow up.  Discussed result, findings, treatment, and follow up  with patient.  Pt given return precautions.  Pt verbalizes understanding and agrees with plan.       Trixie Dredgemily West, PA-C 11/10/13 2212

## 2013-11-10 NOTE — ED Provider Notes (Signed)
Medical screening examination/treatment/procedure(s) were performed by non-physician practitioner and as supervising physician I was immediately available for consultation/collaboration.   EKG Interpretation None        Scott T Goldston, MD 11/10/13 2325 

## 2013-11-10 NOTE — Discharge Instructions (Signed)
Read the information below.  Use the prescribed medication as directed.  Please discuss all new medications with your pharmacist.  Do not take additional tylenol while taking the prescribed pain medication to avoid overdose.  You may return to the Emergency Department at any time for worsening condition or any new symptoms that concern you.  If you develop uncontrolled pain, weakness or numbness of the extremity, severe discoloration of the skin, or you are unable to move your toes, return to the ER for a recheck.      Cast or Splint Care Casts and splints support injured limbs and keep bones from moving while they heal. It is important to care for your cast or splint at home.  HOME CARE INSTRUCTIONS  Keep the cast or splint uncovered during the drying period. It can take 24 to 48 hours to dry if it is made of plaster. A fiberglass cast will dry in less than 1 hour.  Do not rest the cast on anything harder than a pillow for the first 24 hours.  Do not put weight on your injured limb or apply pressure to the cast until your health care provider gives you permission.  Keep the cast or splint dry. Wet casts or splints can lose their shape and may not support the limb as well. A wet cast that has lost its shape can also create harmful pressure on your skin when it dries. Also, wet skin can become infected.  Cover the cast or splint with a plastic bag when bathing or when out in the rain or snow. If the cast is on the trunk of the body, take sponge baths until the cast is removed.  If your cast does become wet, dry it with a towel or a blow dryer on the cool setting only.  Keep your cast or splint clean. Soiled casts may be wiped with a moistened cloth.  Do not place any hard or soft foreign objects under your cast or splint, such as cotton, toilet paper, lotion, or powder.  Do not try to scratch the skin under the cast with any object. The object could get stuck inside the cast. Also, scratching  could lead to an infection. If itching is a problem, use a blow dryer on a cool setting to relieve discomfort.  Do not trim or cut your cast or remove padding from inside of it.  Exercise all joints next to the injury that are not immobilized by the cast or splint. For example, if you have a long leg cast, exercise the hip joint and toes. If you have an arm cast or splint, exercise the shoulder, elbow, thumb, and fingers.  Elevate your injured arm or leg on 1 or 2 pillows for the first 1 to 3 days to decrease swelling and pain.It is best if you can comfortably elevate your cast so it is higher than your heart. SEEK MEDICAL CARE IF:   Your cast or splint cracks.  Your cast or splint is too tight or too loose.  You have unbearable itching inside the cast.  Your cast becomes wet or develops a soft spot or area.  You have a bad smell coming from inside your cast.  You get an object stuck under your cast.  Your skin around the cast becomes red or raw.  You have new pain or worsening pain after the cast has been applied. SEEK IMMEDIATE MEDICAL CARE IF:   You have fluid leaking through the cast.  You are  unable to move your fingers or toes.  You have discolored (blue or white), cool, painful, or very swollen fingers or toes beyond the cast.  You have tingling or numbness around the injured area.  You have severe pain or pressure under the cast.  You have any difficulty with your breathing or have shortness of breath.  You have chest pain. Document Released: 05/07/2000 Document Revised: 02/28/2013 Document Reviewed: 11/16/2012 [facility] Patient Information 2015 [place], [place]. This information is not intended to replace advice given to you by your health care provider. Make sure you discuss any questions you have with your health care provider.  Fibular Fracture, Ankle, Adult, Undisplaced, Treated With Immobilization A simple fracture of the bone below the knee on the outside of  your leg (fibula) usually heals without problems. CAUSES Typically, a fibular fracture occurs as a result of trauma. A blow to the side of your leg or a powerful twisting movement can cause a fracture. Fibular fractures are often seen as a result of football, soccer, or skiing injuries. SYMPTOMS Symptoms of a fibular fracture can include:  Pain.  Shortening or abnormal alignment of your lower leg (angulation). DIAGNOSIS A health care provider will need to examine the leg. X-ray exams will be ordered for further to confirm the fracture and evaluate the extent and of the injury. TREATMENT  Typically, a cast or immobilizer is applied. Sometimes a splint is placed on these fractures if it is needed for comfort or if the bones are badly out of place. Crutches may be needed to help you get around.  HOME CARE INSTRUCTIONS   Apply ice to the injured area:  Put ice in a plastic bag.  Place a towel between your skin and the bag.  Leave the ice on for 20 minutes, 2-3 times a day.  Use crutches as directed. Resume walking without crutches as directed by your health care provider or when comfortable doing so.  Only take over-the-counter or prescription medicines for pain, discomfort, or fever as directed by your health care provider.  Keeping your leg raised may lessen swelling.  If you have a removable splint or boot, do not remove the boot unless directed by your health care provider.  Do not not drive a car or operate a motor vehicle until your health care provider specifically tells you it is safe to do so. SEEK IMMEDIATE MEDICAL CARE IF:   Your cast gets damaged or breaks.  You have continued severe pain or more swelling than you did before the cast was put on, or the pain is not controlled with medications.  Your skin or nails below the injury turn blue or grey, or feel cold or numb.  There is a bad smell or pus coming from under the cast.  You develop severe pain in ankle or  foot. MAKE SURE YOU:   Understand these instructions.  Will watch your condition.  Will get help right away if you are not doing well or get worse. Document Released: 01/30/2002 Document Revised: 02/28/2013 Document Reviewed: 12/20/2012 [facility] Patient Information 2015 [place], [place]. This information is not intended to replace advice given to you by your health care provider. Make sure you discuss any questions you have with your health care provider.

## 2013-11-10 NOTE — ED Notes (Signed)
Patient slipped and fell in the mud. Heard a pop/ EMS splinted. No LOC or head pain

## 2013-11-10 NOTE — ED Notes (Signed)
Bed: [id number] Expected date:  Expected time:  Means of arrival:  Comments: EMS 38 yo F

## 2013-11-15 ENCOUNTER — Other Ambulatory Visit: Payer: Self-pay | Admitting: Orthopedic Surgery

## 2013-11-16 ENCOUNTER — Encounter (HOSPITAL_COMMUNITY)
Admission: RE | Admit: 2013-11-16 | Discharge: 2013-11-16 | Disposition: A | Discharge status: Home / Self Care | Payer: Self-pay | Source: Ambulatory Visit | Attending: Orthopedic Surgery | Admitting: Orthopedic Surgery

## 2013-11-16 ENCOUNTER — Encounter (HOSPITAL_COMMUNITY): Payer: Self-pay

## 2013-11-16 DIAGNOSIS — Z01812 Encounter for preprocedural laboratory examination: Secondary | ICD-10-CM | POA: Insufficient documentation

## 2013-11-16 DIAGNOSIS — Z01818 Encounter for other preprocedural examination: Secondary | ICD-10-CM | POA: Insufficient documentation

## 2013-11-16 HISTORY — DX: Shortness of breath: R06.02

## 2013-11-16 HISTORY — DX: Gastro-esophageal reflux disease without esophagitis: K21.9

## 2013-11-16 LAB — BASIC METABOLIC PANEL
BUN: 16 mg/dL (ref 6–23)
CHLORIDE: 101 meq/L (ref 96–112)
CO2: 21 mEq/L (ref 19–32)
Calcium: 9.9 mg/dL (ref 8.4–10.5)
Creatinine, Ser: 0.84 mg/dL (ref 0.50–1.10)
GFR calc non Af Amer: 89 mL/min — ABNORMAL LOW (ref >90)
Glucose, Bld: 108 mg/dL — ABNORMAL HIGH (ref 70–99)
POTASSIUM: 4.2 meq/L (ref 3.7–5.3)
SODIUM: 140 meq/L (ref 137–147)

## 2013-11-16 LAB — CBC
HCT: 39.7 % (ref 36.0–46.0)
HEMOGLOBIN: 12.8 g/dL (ref 12.0–15.0)
MCH: 29.8 pg (ref 26.0–34.0)
MCHC: 32.2 g/dL (ref 30.0–36.0)
MCV: 92.5 fL (ref 78.0–100.0)
Platelets: 281 10*3/uL (ref 150–400)
RBC: 4.29 MIL/uL (ref 3.87–5.11)
RDW: 13.9 % (ref 11.5–15.5)
WBC: 6.5 10*3/uL (ref 4.0–10.5)

## 2013-11-16 LAB — HCG, SERUM, QUALITATIVE: PREG SERUM: NEGATIVE

## 2013-11-16 NOTE — Pre-Procedure Instructions (Signed)
Catherine Koch  11/16/2013   Your procedure is scheduled on:  Monday, June 29th  [facility] 1000 AM.  Call this number if you have problems the morning of surgery: [phone]   Remember:   Do not eat food or drink liquids after midnight.   Take these medicines the morning of surgery with A SIP OF WATER: zoloft, vistaril if needed, percocet if needed   Do not wear jewelry, make-up or nail polish.  Do not wear lotions, powders, or perfumes. You may wear deodorant.  Do not shave 48 hours prior to surgery. Men may shave face and neck.  Do not bring valuables to the hospital.  [facility] any belongings or valuables.               Contacts, dentures or bridgework may not be worn into surgery.  Leave suitcase in the car. After surgery it may be brought to your room.  For patients admitted to the hospital, discharge time is determined by your treatment team.               Patients discharged the day of surgery will not be allowed to drive home.  Please read over the following fact sheets that you were given: Pain Booklet, Coughing and Deep Breathing and Surgical Site Infection Prevention [place] - Preparing for Surgery  Before surgery, you can play an important role.  Because skin is not sterile, your skin needs to be as free of germs as possible.  You can reduce the number of germs on you skin by washing with CHG (chlorahexidine gluconate) soap before surgery.  CHG is an antiseptic cleaner which kills germs and bonds with the skin to continue killing germs even after washing.  Please DO NOT use if you have an allergy to CHG or antibacterial soaps.  If your skin becomes reddened/irritated stop using the CHG and inform your nurse when you arrive at Short Stay.  Do not shave (including legs and underarms) for at least 48 hours prior to the first CHG shower.  You may shave your face.  Please follow these instructions  carefully:   1.  Shower with CHG Soap the night before surgery and the morning of Surgery.  2.  If you choose to wash your hair, wash your hair first as usual with your normal shampoo.  3.  After you shampoo, rinse your hair and body thoroughly to remove the shampoo.  4.  Use CHG as you would any other liquid soap.  You can apply CHG directly to the skin and wash gently with scrungie or a clean washcloth.  5.  Apply the CHG Soap to your body ONLY FROM THE NECK DOWN.  Do not use on open wounds or open sores.  Avoid contact with your eyes, ears, mouth and genitals (private parts).  Wash genitals (private parts) with your normal soap.  6.  Wash thoroughly, paying special attention to the area where your surgery will be performed.  7.  Thoroughly rinse your body with warm water from the neck down.  8.  DO NOT shower/wash with your normal soap after using and rinsing off the CHG Soap.  9.  Pat yourself dry with a clean towel.            10.  Wear clean pajamas.            11.  Place clean sheets on your bed the night of  your first shower and do not sleep with pets.  Day of Surgery  Do not apply any lotions/deoderants the morning of surgery.  Please wear clean clothes to the hospital/surgery center.

## 2013-11-16 NOTE — Progress Notes (Signed)
Does not currently have primary -  No prior cardiac testing

## 2013-11-18 MED ORDER — CEFAZOLIN SODIUM 10 G IJ SOLR
3.0000 g | INTRAMUSCULAR | Status: AC
  Administered 2013-11-19: 3 g via INTRAVENOUS
  Filled 2013-11-18: qty 3000

## 2013-11-19 ENCOUNTER — Ambulatory Visit (HOSPITAL_COMMUNITY): Payer: Self-pay | Admitting: Certified Registered Nurse Anesthetist

## 2013-11-19 ENCOUNTER — Observation Stay (HOSPITAL_COMMUNITY)
Admission: RE | Admit: 2013-11-19 | Discharge: 2013-11-20 | Disposition: A | Discharge status: Home / Self Care | Payer: Self-pay | Source: Ambulatory Visit | Attending: Orthopedic Surgery | Admitting: Orthopedic Surgery

## 2013-11-19 ENCOUNTER — Encounter (HOSPITAL_COMMUNITY)
Admission: RE | Disposition: A | Discharge status: Home / Self Care | Payer: Self-pay | Source: Ambulatory Visit | Attending: Orthopedic Surgery

## 2013-11-19 ENCOUNTER — Encounter (HOSPITAL_COMMUNITY): Payer: Self-pay | Admitting: Certified Registered Nurse Anesthetist

## 2013-11-19 DIAGNOSIS — Z87891 Personal history of nicotine dependence: Secondary | ICD-10-CM | POA: Insufficient documentation

## 2013-11-19 DIAGNOSIS — G4730 Sleep apnea, unspecified: Secondary | ICD-10-CM | POA: Insufficient documentation

## 2013-11-19 DIAGNOSIS — S82831A Other fracture of upper and lower end of right fibula, initial encounter for closed fracture: Secondary | ICD-10-CM | POA: Diagnosis present

## 2013-11-19 DIAGNOSIS — F3289 Other specified depressive episodes: Secondary | ICD-10-CM | POA: Insufficient documentation

## 2013-11-19 DIAGNOSIS — F411 Generalized anxiety disorder: Secondary | ICD-10-CM | POA: Insufficient documentation

## 2013-11-19 DIAGNOSIS — Z8781 Personal history of (healed) traumatic fracture: Secondary | ICD-10-CM

## 2013-11-19 DIAGNOSIS — X58XXXA Exposure to other specified factors, initial encounter: Secondary | ICD-10-CM | POA: Insufficient documentation

## 2013-11-19 DIAGNOSIS — F329 Major depressive disorder, single episode, unspecified: Secondary | ICD-10-CM | POA: Insufficient documentation

## 2013-11-19 DIAGNOSIS — K219 Gastro-esophageal reflux disease without esophagitis: Secondary | ICD-10-CM | POA: Insufficient documentation

## 2013-11-19 DIAGNOSIS — Z9889 Other specified postprocedural states: Secondary | ICD-10-CM

## 2013-11-19 DIAGNOSIS — S82899A Other fracture of unspecified lower leg, initial encounter for closed fracture: Principal | ICD-10-CM | POA: Insufficient documentation

## 2013-11-19 HISTORY — DX: Other fracture of upper and lower end of right fibula, initial encounter for closed fracture: S82.831A

## 2013-11-19 HISTORY — PX: ORIF FIBULA FRACTURE: SHX5114

## 2013-11-19 SURGERY — OPEN REDUCTION INTERNAL FIXATION (ORIF) FIBULA FRACTURE
Anesthesia: General | Site: Ankle | Laterality: Right

## 2013-11-19 MED ORDER — PROPOFOL 10 MG/ML IV BOLUS
INTRAVENOUS | Status: AC
  Filled 2013-11-19: qty 20

## 2013-11-19 MED ORDER — OXYCODONE-ACETAMINOPHEN 5-325 MG PO TABS
1.0000 | ORAL_TABLET | Freq: Once | ORAL | Status: AC
  Administered 2013-11-19: 1 via ORAL

## 2013-11-19 MED ORDER — MIDAZOLAM HCL 2 MG/2ML IJ SOLN
INTRAMUSCULAR | Status: AC
  Filled 2013-11-19: qty 2

## 2013-11-19 MED ORDER — ARTIFICIAL TEARS OP OINT
TOPICAL_OINTMENT | OPHTHALMIC | Status: DC | PRN
  Administered 2013-11-19: 1 via OPHTHALMIC

## 2013-11-19 MED ORDER — PROMETHAZINE HCL 25 MG PO TABS: 25.0000 mg | ORAL_TABLET | Freq: Four times a day (QID) | ORAL | Status: DC | PRN

## 2013-11-19 MED ORDER — FENTANYL CITRATE 0.05 MG/ML IJ SOLN
INTRAMUSCULAR | Status: AC
  Filled 2013-11-19: qty 5

## 2013-11-19 MED ORDER — BUPIVACAINE HCL (PF) 0.25 % IJ SOLN
INTRAMUSCULAR | Status: DC | PRN
  Administered 2013-11-19: 20 mL

## 2013-11-19 MED ORDER — ONDANSETRON HCL 4 MG/2ML IJ SOLN
INTRAMUSCULAR | Status: AC
  Filled 2013-11-19: qty 2

## 2013-11-19 MED ORDER — SUCCINYLCHOLINE CHLORIDE 20 MG/ML IJ SOLN
INTRAMUSCULAR | Status: AC
  Filled 2013-11-19: qty 1

## 2013-11-19 MED ORDER — SODIUM CHLORIDE 0.9 % IJ SOLN
INTRAMUSCULAR | Status: AC
  Filled 2013-11-19: qty 10

## 2013-11-19 MED ORDER — OXYCODONE-ACETAMINOPHEN 5-325 MG PO TABS
ORAL_TABLET | ORAL | Status: AC
  Filled 2013-11-19: qty 1

## 2013-11-19 MED ORDER — ONDANSETRON HCL 4 MG/2ML IJ SOLN
INTRAMUSCULAR | Status: DC | PRN
  Administered 2013-11-19: 4 mg via INTRAVENOUS

## 2013-11-19 MED ORDER — PROPOFOL 10 MG/ML IV BOLUS
INTRAVENOUS | Status: DC | PRN
  Administered 2013-11-19: 50 mg via INTRAVENOUS
  Administered 2013-11-19: 200 mg via INTRAVENOUS

## 2013-11-19 MED ORDER — POTASSIUM CHLORIDE IN NACL 20-0.45 MEQ/L-% IV SOLN
INTRAVENOUS | Status: DC
  Administered 2013-11-20: 01:00:00 via INTRAVENOUS
  Filled 2013-11-19 (×3): qty 1000

## 2013-11-19 MED ORDER — LACTATED RINGERS IV SOLN
INTRAVENOUS | Status: DC | PRN
  Administered 2013-11-19 (×2): via INTRAVENOUS

## 2013-11-19 MED ORDER — FENTANYL CITRATE 0.05 MG/ML IJ SOLN
INTRAMUSCULAR | Status: AC
  Administered 2013-11-19: 100 ug
  Filled 2013-11-19: qty 2

## 2013-11-19 MED ORDER — LIDOCAINE HCL (CARDIAC) 20 MG/ML IV SOLN
INTRAVENOUS | Status: DC | PRN
  Administered 2013-11-19: 80 mg via INTRAVENOUS

## 2013-11-19 MED ORDER — MIDAZOLAM HCL 5 MG/5ML IJ SOLN
INTRAMUSCULAR | Status: DC | PRN
  Administered 2013-11-19 (×2): 1 mg via INTRAVENOUS

## 2013-11-19 MED ORDER — EPHEDRINE SULFATE 50 MG/ML IJ SOLN
INTRAMUSCULAR | Status: AC
  Filled 2013-11-19: qty 1

## 2013-11-19 MED ORDER — HYDROMORPHONE HCL PF 1 MG/ML IJ SOLN
INTRAMUSCULAR | Status: AC
  Filled 2013-11-19: qty 1

## 2013-11-19 MED ORDER — FENTANYL CITRATE 0.05 MG/ML IJ SOLN
INTRAMUSCULAR | Status: DC | PRN
  Administered 2013-11-19 (×5): 50 ug via INTRAVENOUS

## 2013-11-19 MED ORDER — HYDROMORPHONE HCL PF 1 MG/ML IJ SOLN
0.2500 mg | INTRAMUSCULAR | Status: DC | PRN
  Administered 2013-11-19 (×3): 0.5 mg via INTRAVENOUS

## 2013-11-19 MED ORDER — SUCCINYLCHOLINE CHLORIDE 20 MG/ML IJ SOLN
INTRAMUSCULAR | Status: DC | PRN
  Administered 2013-11-19: 100 mg via INTRAVENOUS

## 2013-11-19 MED ORDER — OXYCODONE-ACETAMINOPHEN 5-325 MG PO TABS: 1.0000 | ORAL_TABLET | Freq: Four times a day (QID) | ORAL | Status: DC | PRN

## 2013-11-19 MED ORDER — BUPIVACAINE HCL (PF) 0.25 % IJ SOLN
INTRAMUSCULAR | Status: AC
  Filled 2013-11-19: qty 30

## 2013-11-19 MED ORDER — ROCURONIUM BROMIDE 50 MG/5ML IV SOLN
INTRAVENOUS | Status: AC
  Filled 2013-11-19: qty 1

## 2013-11-19 MED ORDER — OXYCODONE-ACETAMINOPHEN 5-325 MG PO TABS
2.0000 | ORAL_TABLET | ORAL | Status: DC | PRN
  Administered 2013-11-19 – 2013-11-20 (×4): 2 via ORAL
  Filled 2013-11-19 (×5): qty 2

## 2013-11-19 MED ORDER — SENNA-DOCUSATE SODIUM 8.6-50 MG PO TABS: 2.0000 | ORAL_TABLET | Freq: Every day | ORAL | Status: DC

## 2013-11-19 MED ORDER — MIDAZOLAM HCL 2 MG/2ML IJ SOLN
INTRAMUSCULAR | Status: AC
  Administered 2013-11-19: 2 mg
  Filled 2013-11-19: qty 2

## 2013-11-19 MED ORDER — HYDROMORPHONE HCL PF 1 MG/ML IJ SOLN
1.0000 mg | INTRAMUSCULAR | Status: DC | PRN
  Administered 2013-11-20: 1 mg via INTRAVENOUS
  Filled 2013-11-19: qty 1

## 2013-11-19 MED ORDER — LACTATED RINGERS IV SOLN: INTRAVENOUS | Status: DC

## 2013-11-19 MED ORDER — LIDOCAINE HCL (CARDIAC) 20 MG/ML IV SOLN
INTRAVENOUS | Status: AC
  Filled 2013-11-19: qty 5

## 2013-11-19 SURGICAL SUPPLY — 68 items
BANDAGE ELASTIC 4 VELCRO ST LF (GAUZE/BANDAGES/DRESSINGS) ×3 IMPLANT
BANDAGE ELASTIC 6 VELCRO ST LF (GAUZE/BANDAGES/DRESSINGS) ×3 IMPLANT
BANDAGE ESMARK 6X9 LF (GAUZE/BANDAGES/DRESSINGS) IMPLANT
BENZOIN TINCTURE PRP APPL 2/3 (GAUZE/BANDAGES/DRESSINGS) ×3 IMPLANT
BIT DRILL 2.5X110 QC LCP DISP (BIT) ×3 IMPLANT
BIT DRILL QC 3.5X110 (BIT) ×3 IMPLANT
BNDG COHESIVE 4X5 TAN STRL (GAUZE/BANDAGES/DRESSINGS) ×3 IMPLANT
BNDG ESMARK 6X9 LF (GAUZE/BANDAGES/DRESSINGS)
BOOTCOVER CLEANROOM LRG (PROTECTIVE WEAR) ×6 IMPLANT
CLOSURE STERI-STRIP 1/2X4 (GAUZE/BANDAGES/DRESSINGS) ×1
CLSR STERI-STRIP ANTIMIC 1/2X4 (GAUZE/BANDAGES/DRESSINGS) ×2 IMPLANT
COVER SURGICAL LIGHT HANDLE (MISCELLANEOUS) ×3 IMPLANT
CUFF TOURNIQUET SINGLE 34IN LL (TOURNIQUET CUFF) IMPLANT
CUFF TOURNIQUET SINGLE 44IN (TOURNIQUET CUFF) ×3 IMPLANT
DECANTER SPIKE VIAL GLASS SM (MISCELLANEOUS) IMPLANT
DRAPE C-ARM 42X72 X-RAY (DRAPES) IMPLANT
DRAPE IMP U-DRAPE 54X76 (DRAPES) ×3 IMPLANT
DRAPE OEC MINIVIEW 54X84 (DRAPES) IMPLANT
DRAPE U-SHAPE 47X51 STRL (DRAPES) IMPLANT
DRSG PAD ABDOMINAL 8X10 ST (GAUZE/BANDAGES/DRESSINGS) ×3 IMPLANT
DURAPREP 26ML APPLICATOR (WOUND CARE) ×3 IMPLANT
ELECT REM PT RETURN 9FT ADLT (ELECTROSURGICAL) ×3
ELECTRODE REM PT RTRN 9FT ADLT (ELECTROSURGICAL) ×1 IMPLANT
GAUZE XEROFORM 1X8 LF (GAUZE/BANDAGES/DRESSINGS) ×6 IMPLANT
GLOVE BIOGEL PI ORTHO PRO SZ8 (GLOVE) ×4
GLOVE ORTHO TXT STRL SZ7.5 (GLOVE) ×6 IMPLANT
GLOVE PI ORTHO PRO STRL SZ8 (GLOVE) ×2 IMPLANT
GLOVE SURG ORTHO 8.0 STRL STRW (GLOVE) ×6 IMPLANT
GLOVE SURG SS PI 6.5 STRL IVOR (GLOVE) ×3 IMPLANT
GOWN STRL REUS W/ TWL LRG LVL3 (GOWN DISPOSABLE) IMPLANT
GOWN STRL REUS W/TWL LRG LVL3 (GOWN DISPOSABLE)
KIT BASIN OR (CUSTOM PROCEDURE TRAY) ×3 IMPLANT
KIT ROOM TURNOVER OR (KITS) ×3 IMPLANT
MANIFOLD NEPTUNE II (INSTRUMENTS) ×3 IMPLANT
NS IRRIG 1000ML POUR BTL (IV SOLUTION) ×3 IMPLANT
PACK ORTHO EXTREMITY (CUSTOM PROCEDURE TRAY) ×3 IMPLANT
PAD ABD 8X10 STRL (GAUZE/BANDAGES/DRESSINGS) ×3 IMPLANT
PAD ARMBOARD 7.5X6 YLW CONV (MISCELLANEOUS) ×6 IMPLANT
PAD CAST 4YDX4 CTTN HI CHSV (CAST SUPPLIES) ×2 IMPLANT
PADDING CAST ABS 4INX4YD NS (CAST SUPPLIES) ×4
PADDING CAST ABS COTTON 4X4 ST (CAST SUPPLIES) ×2 IMPLANT
PADDING CAST COTTON 4X4 STRL (CAST SUPPLIES) ×4
PLATE LCP 3.5 1/3 TUB 5HX57 (Plate) ×3 IMPLANT
SCREW CANC FT 4.0X20 (Screw) ×3 IMPLANT
SCREW CANC FT/18 4.0 (Screw) ×3 IMPLANT
SCREW CORTEX 3.5 16MM (Screw) ×4 IMPLANT
SCREW CORTEX 3.5 26MM (Screw) ×2 IMPLANT
SCREW LOCK CORT ST 3.5X16 (Screw) ×2 IMPLANT
SCREW LOCK CORT ST 3.5X26 (Screw) ×1 IMPLANT
SPLINT PLASTER CAST XFAST 5X30 (CAST SUPPLIES) ×2 IMPLANT
SPLINT PLASTER XFAST SET 5X30 (CAST SUPPLIES) ×4
SPONGE GAUZE 4X4 12PLY (GAUZE/BANDAGES/DRESSINGS) ×3 IMPLANT
SPONGE LAP 4X18 X RAY DECT (DISPOSABLE) ×6 IMPLANT
STAPLER VISISTAT 35W (STAPLE) ×3 IMPLANT
STOCKINETTE IMPERVIOUS LG (DRAPES) ×3 IMPLANT
SUCTION FRAZIER TIP 10 FR DISP (SUCTIONS) ×3 IMPLANT
SUT MNCRL AB 4-0 PS2 18 (SUTURE) ×3 IMPLANT
SUT VIC AB 0 CTB1 27 (SUTURE) ×6 IMPLANT
SUT VIC AB 2-0 CTB1 (SUTURE) ×6 IMPLANT
SUT VIC AB 3-0 SH 18 (SUTURE) ×3 IMPLANT
SYR CONTROL 10ML LL (SYRINGE) IMPLANT
TOWEL OR 17X24 6PK STRL BLUE (TOWEL DISPOSABLE) ×3 IMPLANT
TOWEL OR 17X26 10 PK STRL BLUE (TOWEL DISPOSABLE) ×3 IMPLANT
TUBE CONNECTING 12'X1/4 (SUCTIONS) ×1
TUBE CONNECTING 12X1/4 (SUCTIONS) ×2 IMPLANT
UNDERPAD 30X30 INCONTINENT (UNDERPADS AND DIAPERS) ×3 IMPLANT
WATER STERILE IRR 1000ML POUR (IV SOLUTION) ×3 IMPLANT
YANKAUER SUCT BULB TIP NO VENT (SUCTIONS) IMPLANT

## 2013-11-19 NOTE — Anesthesia Postprocedure Evaluation (Signed)
  Anesthesia Post-op Note  Patient: Catherine Koch  Procedure(s) Performed: Procedure(s): OPEN REDUCTION INTERNAL FIXATION (ORIF) RIGHT FIBULA FRACTURE (Right)  Patient Location: PACU  Anesthesia Type:General  Level of Consciousness: awake  Airway and Oxygen Therapy: Patient Spontanous Breathing  Post-op Pain: mild  Post-op Assessment: Post-op Vital signs reviewed  Post-op Vital Signs: Reviewed  Last Vitals:  Filed Vitals:   11/19/13 1800  BP:   Pulse: 78  Temp:   Resp: 18    Complications: No apparent anesthesia complications

## 2013-11-19 NOTE — H&P (Signed)
PREOPERATIVE H&P  Chief Complaint: RIGHT FIBULA FRACTURE   HPI: Catherine Koch is a 38 y.o. female who presents for preoperative history and physical with a diagnosis of RIGHT FIBULA FRACTURE . Symptoms are rated as moderate to severe, and have been worsening.  This is significantly impairing activities of daily living.  She has elected for surgical management.  She presented to [facility] emergency room and was referred to my office for evaluation. She was found to have a displaced distal fibula fracture with medial clear space widening, and elected for surgical management based on the displacement. Her fracture is approximately 321 week old.  She does have sleep apnea, and uses her CPAP machine regularly.  Past Medical History  Diagnosis Date  . Depression   . Ovarian failure   . Anxiety   . UTI (lower urinary tract infection)   . Sleep apnea     cpap  . Shortness of breath     exertion from crutches  . GERD (gastroesophageal reflux disease)    Past Surgical History  Procedure Laterality Date  . Cholecystectomy  2010  . Cholecystectomy    . Wisdom tooth extraction     History   Social History  . Marital Status: Single    Spouse Name: N/A    Number of Children: N/A  . Years of Education: N/A   Occupational History  .      [employer]    Social History Main Topics  . Smoking status: Former Smoker    Types: Cigarettes  . Smokeless tobacco: Never Used  . Alcohol Use: Yes     Comment: occasionally  . Drug Use: No  . Sexual Activity: Yes    Birth Control/ Protection: None   Other Topics Concern  . None   Social History Narrative  . None   Family History  Problem Relation Age of Onset  . Allergies Sister   . Heart disease Paternal Grandmother   . Cancer Paternal Grandmother     BREAST CANCER  . Cancer Paternal Grandfather    Allergies  Allergen Reactions  . Sulfa Antibiotics Anaphylaxis, Swelling and Rash    Throat swelling/ rashes   Prior to Admission  medications   Medication Sig Start Date End Date Taking? Authorizing Edenilson Austad  acetaminophen (TYLENOL) 500 MG tablet Take 1,000 mg by mouth every 6 (six) hours as needed.   Yes Historical Patsy Zaragoza, MD  cetirizine (ZYRTEC) 10 MG tablet Take 10 mg by mouth at bedtime.    Yes Historical Westyn Driggers, MD  hydrOXYzine (ATARAX/VISTARIL) 25 MG tablet Take 25 mg by mouth 3 (three) times daily as needed for anxiety.   Yes Historical Ayomide Purdy, MD  Multiple Vitamin (MULTIVITAMIN WITH MINERALS) TABS tablet Take 1 tablet by mouth every morning.   Yes Historical Dayshaun Whobrey, MD  Nutritional Supplements (BEE POLLEN/ROYAL JELLY/HONEY PO) Take 1 tablet by mouth every morning.    Yes Historical Charlsey Moragne, MD  sertraline (ZOLOFT) 50 MG tablet Take 50 mg by mouth every morning.   Yes Historical Tierra Divelbiss, MD  oxyCODONE-acetaminophen (PERCOCET/ROXICET) 5-325 MG per tablet Take 1-2 tablets by mouth every 4 (four) hours as needed for moderate pain or severe pain. 11/10/13   Trixie DredgeEmily West, PA-C     Positive ROS: All other systems have been reviewed and were otherwise negative with the exception of those mentioned in the HPI and as above.  Physical Exam: General: Alert, no acute distress Cardiovascular: No pedal edema Respiratory: No cyanosis, no use of accessory musculature GI: No organomegaly, abdomen  is soft and non-tender Skin: No lesions in the area of chief complaint Neurologic: Sensation intact distally Psychiatric: Patient is competent for consent with normal mood and affect Lymphatic: No axillary or cervical lymphadenopathy  MUSCULOSKELETAL: Right ankle has ecchymosis and pain to palpation laterally, medially is nontender.  Assessment: RIGHT FIBULA FRACTURE   Plan: Plan for Procedure(s): OPEN REDUCTION INTERNAL FIXATION (ORIF) RIGHT FIBULA FRACTURE  The risks benefits and alternatives were discussed with the patient including but not limited to the risks of nonoperative treatment, versus surgical intervention  including infection, bleeding, nerve injury, malunion, nonunion, the need for revision surgery, hardware prominence, hardware failure, the need for hardware removal, blood clots, cardiopulmonary complications, morbidity, mortality, among others, and they were willing to proceed.     Eulas PostLANDAU,JOSHUA P, MD Cell [phone]   11/19/2013 3:19 PM

## 2013-11-19 NOTE — Transfer of Care (Signed)
Immediate Anesthesia Transfer of Care Note  Patient: Catherine Koch  Procedure(s) Performed: Procedure(s): OPEN REDUCTION INTERNAL FIXATION (ORIF) RIGHT FIBULA FRACTURE (Right)  Patient Location: PACU  Anesthesia Type:General  Level of Consciousness: awake, alert  and oriented  Airway & Oxygen Therapy: Patient Spontanous Breathing and Patient connected to nasal cannula oxygen  Post-op Assessment: Report given to PACU RN and Post -op Vital signs reviewed and stable  Post vital signs: Reviewed and stable  Complications: No apparent anesthesia complications

## 2013-11-19 NOTE — Anesthesia Procedure Notes (Addendum)
Anesthesia Regional Block:  Popliteal block  Pre-Anesthetic Checklist: ,, timeout performed, Correct Patient, Correct Site, Correct Laterality, Correct Procedure, Correct Position, site marked, Risks and benefits discussed, pre-op evaluation, post-op pain management  Laterality: Right  Prep: chloraprep       Needles:   Needle Type: Stimulator Needle - 80          Additional Needles:  Procedures: Doppler guided and nerve stimulator Popliteal block  Nerve Stimulator or Paresthesia:  Response: 0.5 mA,   Additional Responses:   Narrative:  Start time: 11/19/2013 11:05 AM End time: 11/19/2013 11:20 AM Injection made incrementally with aspirations every 5 mL.  Performed by: Personally  Anesthesiologist: Dr. Randa EvensEdwards   Procedure Name: Intubation Date/Time: 11/19/2013 3:34 PM Performed by: Leonel Ramsay'LAUGHLIN, Gricelda Foland H Pre-anesthesia Checklist: Patient identified, Patient being monitored, Emergency Drugs available, Timeout performed and Suction available Patient Re-evaluated:Patient Re-evaluated prior to inductionOxygen Delivery Method: Circle system utilized Preoxygenation: Pre-oxygenation with 100% oxygen Intubation Type: IV induction Laryngoscope Size: Mac and 3 Grade View: Grade I Tube type: Oral Tube size: 7.5 mm Number of attempts: 1 Airway Equipment and Method: Stylet Placement Confirmation: ETT inserted through vocal cords under direct vision,  positive ETCO2 and breath sounds checked- equal and bilateral Secured at: 22 cm Tube secured with: Tape Dental Injury: Teeth and Oropharynx as per pre-operative assessment

## 2013-11-19 NOTE — Anesthesia Preprocedure Evaluation (Signed)
Anesthesia Evaluation  Patient identified by MRN, date of birth, ID band Patient awake    Reviewed: Allergy & Precautions, H&P , NPO status , Patient's Chart, lab work & pertinent test results  Airway Mallampati: II      Dental   Pulmonary shortness of breath, sleep apnea , former smoker,  breath sounds clear to auscultation        Cardiovascular negative cardio ROS  Rhythm:Regular Rate:Normal     Neuro/Psych Anxiety Depression    GI/Hepatic GERD-  ,  Endo/Other    Renal/GU      Musculoskeletal   Abdominal   Peds  Hematology   Anesthesia Other Findings   Reproductive/Obstetrics                           Anesthesia Physical Anesthesia Plan  ASA: III  Anesthesia Plan: General   Post-op Pain Management:    Induction: Intravenous  Airway Management Planned: Oral ETT  Additional Equipment:   Intra-op Plan:   Post-operative Plan: Possible Post-op intubation/ventilation  Informed Consent: I have reviewed the patients History and Physical, chart, labs and discussed the procedure including the risks, benefits and alternatives for the proposed anesthesia with the patient or authorized representative who has indicated his/her understanding and acceptance.   Dental advisory given  Plan Discussed with: CRNA and Anesthesiologist  Anesthesia Plan Comments:         Anesthesia Quick Evaluation

## 2013-11-19 NOTE — Discharge Instructions (Signed)
Diet: As you were doing prior to hospitalization   Shower:  May shower but keep the wounds dry, use an occlusive plastic wrap, NO SOAKING IN TUB.  If the bandage gets wet, change with a clean dry gauze.  Dressing:  You may change your dressing 3-5 days after surgery.  Then change the dressing daily with sterile gauze dressing.    There are sticky tapes (steri-strips) on your wounds and all the stitches are absorbable.  Leave the steri-strips in place when changing your dressings, they will peel off with time, usually 2-3 weeks.  Activity:  Increase activity slowly as tolerated, but follow the weight bearing instructions below.  No lifting or driving for 6 weeks.  Weight Bearing:   nonweight bearing.    To prevent constipation: you may use a stool softener such as -  Colace (over the counter) 100 mg by mouth twice a day  Drink plenty of fluids (prune juice may be helpful) and high fiber foods Miralax (over the counter) for constipation as needed.    Itching:  If you experience itching with your medications, try taking only a single pain pill, or even half a pain pill at a time.  You may take up to 10 pain pills per day, and you can also use benadryl over the counter for itching or also to help with sleep.   Precautions:  If you experience chest pain or shortness of breath - call 911 immediately for transfer to the hospital emergency department!!  If you develop a fever greater that 101 F, purulent drainage from wound, increased redness or drainage from wound, or calf pain -- Call the office at [phone]                                                Follow- Up Appointment:  Please call for an appointment to be seen in 2 weeks [place] - [phone]

## 2013-11-19 NOTE — Op Note (Signed)
11/19/2013  PATIENT:  Catherine Koch    PRE-OPERATIVE DIAGNOSIS:  RIGHT FIBULA FRACTURE   POST-OPERATIVE DIAGNOSIS:  Same  PROCEDURE:  OPEN REDUCTION INTERNAL FIXATION (ORIF) RIGHT FIBULA FRACTURE  SURGEON:  Eulas PostLANDAU,JOSHUA P, MD  PHYSICIAN ASSISTANT: Janace LittenBrandon Parry, OPA-C, present and scrubbed throughout the case, critical for completion in a timely fashion, and for retraction, instrumentation, and closure.  ANESTHESIA:   General  PREOPERATIVE INDICATIONS:  Catherine Koch is a  38 y.o. female with a diagnosis of RIGHT FIBULA FRACTURE  who elected for surgical management to minimize the risk for malunion and nonunion and post-traumatic arthritis.    The risks benefits and alternatives were discussed with the patient preoperatively including but not limited to the risks of infection, bleeding, nerve injury, cardiopulmonary complications, the need for revision surgery, the need for hardware removal, among others, and the patient was willing to proceed.  OPERATIVE IMPLANTS: Synthes 1/3 tubular plate, with a single interfragmentary lag screw.  OPERATIVE PROCEDURE: The patient was brought to the operating room and placed in the supine position. All bony prominences were padded. General anesthesia was administered. The left lower extremity was prepped and draped in the usual sterile fashion. The leg was elevated and exsanguinated and the tourniquet was inflated. Time out was performed.   Incision was made over the distal fibula and the fracture was exposed and reduced anatomically with a clamp. A lag screw was placed. I then applied a 1/3 tubular plate and secured it proximally and distally non-locking screws. Bone quality was mediocre. I used c-arm to confirm satisfactory reduction and fixation. The distal fibula had a fairly long lateral Hoke distally, and I had to direct the screws superiorly fairly aggressively in order to achieve purchase. My initial drill was left superiorly inclined, and  went bicortical without adequate length, so I redirected more proximally.  The syndesmosis was stressed using live fluoroscopy and found to be stable.   The wounds were irrigated, and closed with vicryl with routine closure for the skin. The wounds were injected with local anesthetic. Sterile gauze was applied followed by a posterior splint. She was awakened and returned to the PACU in stable and satisfactory condition. There were no complications.

## 2013-11-20 ENCOUNTER — Encounter (HOSPITAL_COMMUNITY): Payer: Self-pay | Admitting: Orthopedic Surgery

## 2013-11-20 MED ORDER — METHOCARBAMOL 500 MG PO TABS: 500.0000 mg | ORAL_TABLET | Freq: Four times a day (QID) | ORAL | Status: DC

## 2013-11-20 NOTE — Progress Notes (Signed)
Discharge instructions given to patient and boyfriend. Prescriptions given to patient. All questions answered. DME was not delivered to the floor. Boyfriend will pick up 3n1 at [facility] in the morning. Facesheet and order given per [facility] at [facility].

## 2013-11-20 NOTE — Discharge Summary (Signed)
Physician Discharge Summary  Patient ID: Catherine Koch MRN: [id number] DOB/AGE: 36/08/1975 36 y.o.  Admit date: 11/19/2013 Discharge date: 11/20/2013  Admission Diagnoses:  Closed fracture of right distal fibula  Discharge Diagnoses:  Principal Problem:   Closed fracture of right distal fibula Active Problems:   S/P ORIF (open reduction internal fixation) fracture   Past Medical History  Diagnosis Date  . Depression   . Ovarian failure   . Anxiety   . UTI (lower urinary tract infection)   . Sleep apnea     cpap  . Shortness of breath     exertion from crutches  . GERD (gastroesophageal reflux disease)   . Closed fracture of right distal fibula 11/19/2013    Surgeries: Procedure(s): OPEN REDUCTION INTERNAL FIXATION (ORIF) RIGHT FIBULA FRACTURE on 11/19/2013   Consultants (if any):    Discharged Condition: Improved  Hospital Course: Catherine Koch is an 38 y.o. female who was admitted 11/19/2013 with a diagnosis of Closed fracture of right distal fibula and went to the operating room on 11/19/2013 and underwent the above named procedures.    She was given perioperative antibiotics:  Anti-infectives   Start     Dose/Rate Route Frequency Ordered Stop   11/19/13 0600  ceFAZolin (ANCEF) 3 g in dextrose 5 % 50 mL IVPB     3 g 160 mL/hr over 30 Minutes Intravenous On call to O.R. 11/18/13 1312 11/19/13 1540    .  She was given sequential compression devices, early ambulation, for DVT prophylaxis.  She benefited maximally from the hospital stay and there were no complications.    Recent vital signs:  Filed Vitals:   11/20/13 0519  BP: 120/65  Pulse: 78  Temp: 98.3 F (36.8 C)  Resp: 18    Recent laboratory studies:  Lab Results  Component Value Date   HGB 12.8 11/16/2013   HGB 12.9 08/21/2013   HGB 13.2 02/06/2013   Lab Results  Component Value Date   WBC 6.5 11/16/2013   PLT 281 11/16/2013   No results found for this basename: INR   Lab Results   Component Value Date   NA 140 11/16/2013   K 4.2 11/16/2013   CL 101 11/16/2013   CO2 21 11/16/2013   BUN 16 11/16/2013   CREATININE 0.84 11/16/2013   GLUCOSE 108* 11/16/2013    Discharge Medications:     Medication List    STOP taking these medications       acetaminophen 500 MG tablet  Commonly known as:  TYLENOL      TAKE these medications       BEE POLLEN/ROYAL JELLY/HONEY PO  Take 1 tablet by mouth every morning.     cetirizine 10 MG tablet  Commonly known as:  ZYRTEC  Take 10 mg by mouth at bedtime.     hydrOXYzine 25 MG tablet  Commonly known as:  ATARAX/VISTARIL  Take 25 mg by mouth 3 (three) times daily as needed for anxiety.     methocarbamol 500 MG tablet  Commonly known as:  ROBAXIN  Take 1 tablet (500 mg total) by mouth 4 (four) times daily.     multivitamin with minerals Tabs tablet  Take 1 tablet by mouth every morning.     oxyCODONE-acetaminophen 5-325 MG per tablet  Commonly known as:  PERCOCET/ROXICET  Take 1-2 tablets by mouth every 6 (six) hours as needed for moderate pain or severe pain.     promethazine 25 MG tablet  Commonly known as:  PHENERGAN  Take 1 tablet (25 mg total) by mouth every 6 (six) hours as needed for nausea or vomiting.     sennosides-docusate sodium 8.6-50 MG tablet  Commonly known as:  SENOKOT-S  Take 2 tablets by mouth daily.     sertraline 50 MG tablet  Commonly known as:  ZOLOFT  Take 50 mg by mouth every morning.        Diagnostic Studies: Dg Ankle Complete Right  11/10/2013   CLINICAL DATA:  Ankle injury and pain.  EXAM: RIGHT ANKLE - COMPLETE 3+ VIEW  COMPARISON:  None  FINDINGS: An oblique fracture of the distal fibula is is noted.  Soft tissue swelling is present.  There is no evidence of dislocation.  A joint effusion is noted.  The talus is unremarkable.  IMPRESSION: Oblique fracture of the distal fibula.   Electronically Signed   By: Laveda AbbeJeff  Hu M.D.   On: 11/10/2013 20:54    Disposition: 01-Home or Self  Care      Discharge Instructions   Call MD / Call 911    Complete by:  As directed   If you experience chest pain or shortness of breath, CALL 911 and be transported to the hospital emergency room.  If you develope a fever above 101 F, pus (white drainage) or increased drainage or redness at the wound, or calf pain, call your surgeon's office.     Constipation Prevention    Complete by:  As directed   Drink plenty of fluids.  Prune juice may be helpful.  You may use a stool softener, such as Colace (over the counter) 100 mg twice a day.  Use MiraLax (over the counter) for constipation as needed.     Diet general    Complete by:  As directed      Discharge instructions    Complete by:  As directed   Change dressing in 3 days and reapply fresh dressing, unless you have a splint (half cast).  If you have a splint/cast, just leave in place until your follow-up appointment.    Keep wounds dry for 3 weeks.  Leave steri-strips in place on skin.  Do not apply lotion or anything to the wound.     Non weight bearing    Complete by:  As directed            Follow-up Information   Follow up with Eulas PostLANDAU,JOSHUA P, MD. Schedule an appointment as soon as possible for a visit in 2 weeks.   Specialty:  Orthopedic Surgery   Contact information:   [address] [place] [address] [phone]        Signed: Eulas PostLANDAU,JOSHUA P 11/20/2013, 10:18 AM

## 2013-11-20 NOTE — Care Management Note (Signed)
CARE MANAGEMENT NOTE 11/20/2013  Patient:  Catherine Koch,Catherine Koch   Account Number:  [id number]  Date Initiated:  11/20/2013  Documentation initiated by:  Vance PeperBRADY,SUSAN  Subjective/Objective Assessment:   38 yr old female s/p ORIF of right distal fib fracture     Action/Plan:   No home health needs identified. Patient has support at discharge.   Anticipated DC Date:  11/20/2013   Anticipated DC Plan:  HOME/SELF CARE      DC Planning Services  CM consult      PAC Choice  DURABLE MEDICAL EQUIPMENT   Choice offered to / List presented to:     DME arranged  3-N-1      DME agency  [facility]     [facility]  NA      Status of service:  Completed, signed off Medicare Important Message given?   (If response is "NO", the following Medicare IM given date fields will be blank) Date Medicare IM given:   Medicare IM given by:   Date Additional Medicare IM given:   Additional Medicare IM given by:    Discharge Disposition:  HOME/SELF CARE

## 2013-11-20 NOTE — Evaluation (Signed)
Physical Therapy Evaluation Patient Details Name: Catherine KayserJanine Koch MRN: [id number] DOB: 08/01/1975 Today's Date: 11/20/2013   History of Present Illness  Catherine Koch is a 38 y.o. female who presents for preoperative history and physical with a diagnosis of RIGHT FIBULA FRACTURE . Symptoms are rated as moderate to severe, and have been worsening.  This is significantly impairing activities of daily living.  She has elected for surgical management.  She presented to [facility] emergency room and was referred to my office for evaluation. She was found to have a displaced distal fibula fracture with medial clear space widening, and elected for surgical management based on the displacement. Her fracture is approximately 951 week old. Pt s/p ORIF to R distal fibula 6/29.  Clinical Impression  Pt with labored effort during ambulation and stair negotiation however has been managing at home for last week with assist of family/friends. Pt able to report safe technique for stair negotiation to enter home. Pt educated on R LE there ex to strengthen LE to prepare for ambulation in future. Acute PT to con't to follow to progress ambulation endurance and stair negotiation.    Follow Up Recommendations No PT follow up;Supervision/Assistance - 24 hour (outpatient once cleared by MD and cast removed)    Equipment Recommendations  None recommended by PT (pt prefers standard walker, defered RW)    Recommendations for Other Services       Precautions / Restrictions Precautions Precautions: Fall Restrictions Weight Bearing Restrictions: Yes RLE Weight Bearing: Non weight bearing      Mobility  Bed Mobility Overal bed mobility: Modified Independent             General bed mobility comments: used bed rail  Transfers Overall transfer level: Needs assistance Equipment used: Rolling walker (2 wheeled) Transfers: Sit to/from Stand Sit to Stand: Min guard         General transfer comment: max  v/c's for hand placement, increased time  Ambulation/Gait Ambulation/Gait assistance: Min guard Ambulation Distance (Feet): 20 Feet Assistive device: Rolling walker (2 wheeled) Gait Pattern/deviations: Step-to pattern Gait velocity: decreased   General Gait Details: labored effort, compliant with R LE WNB  Stairs Stairs: Yes Stairs assistance: Mod assist Stair Management: Two rails;Step to pattern Number of Stairs: 1 General stair comments: appropriate crutches not available, pt reports good understanding of technique on how to do the stair with crutches and handrail with additional assist from family/fiance. Friend present validified.   Wheelchair Mobility    Modified Rankin (Stroke Patients Only)       Balance Overall balance assessment:  (pt requires AD due to R LE NWB and obesity)                                           Pertinent Vitals/Pain 8/10 R LE pain    Home Living Family/patient expects to be discharged to:: Private residence Living Arrangements: Spouse/significant other Available Help at Discharge: Family;Friend(s);Available 24 hours/day Type of Home: House Home Access: Stairs to enter Entrance Stairs-Rails: Right;Left (can't reach both) Entrance Stairs-Number of Steps: 3 Home Layout: One level Home Equipment: Walker - standard      Prior Function Level of Independence: Needs assistance   Gait / Transfers Assistance Needed: pt was using both axillary crutches and standard walker over the last week since her injury 6/20.  ADL's / Homemaking Assistance Needed: pt with difficulty performing peri-area  hygiene        Hand Dominance   Dominant Hand: Right    Extremity/Trunk Assessment   Upper Extremity Assessment: Overall WFL for tasks assessed           Lower Extremity Assessment: RLE deficits/detail RLE Deficits / Details: ankle in cast, limited knee flexion due to cast and pain, hip WFL    Cervical / Trunk Assessment:  Normal  Communication   Communication: No difficulties  Cognition Arousal/Alertness: Awake/alert Behavior During Therapy: WFL for tasks assessed/performed Overall Cognitive Status: Within Functional Limits for tasks assessed                      General Comments General comments (skin integrity, edema, etc.): pt with request to use bathroom. pt supervision with std pvt transfer to/from bsc, pt dependent with peri hygiene due to body habitus and inability to balance self or perform in sitting    Exercises General Exercises - Lower Extremity Ankle Circles/Pumps:  (wiggled R toes) Quad Sets: AROM;Right;10 reps;Seated Straight Leg Raises: PROM;Right;5 reps;Seated (with LEs elevated) Hip Flexion/Marching: AROM;Right;10 reps;Supine (brough knee to chest)      Assessment/Plan    PT Assessment Patient needs continued PT services  PT Diagnosis Difficulty walking;Acute pain   PT Problem List Decreased strength;Decreased activity tolerance;Decreased range of motion;Decreased mobility  PT Treatment Interventions DME instruction;Gait training;Stair training;Functional mobility training;Therapeutic activities;Therapeutic exercise   PT Goals (Current goals can be found in the Care Plan section) Acute Rehab PT Goals PT Goal Formulation: With patient Time For Goal Achievement: 11/27/13 Potential to Achieve Goals: Good    Frequency Min 5X/week   Barriers to discharge        Co-evaluation               End of Session Equipment Utilized During Treatment: Gait belt Activity Tolerance: Patient tolerated treatment well Patient left: in chair;with call bell/phone within reach;with family/visitor present Nurse Communication: Mobility status    Functional Assessment Tool Used: clinical judgement Functional Limitation: Mobility: Walking and moving around Mobility: Walking and Moving Around Current Status [phone]): At least 40 percent but less than 60 percent impaired, limited or  restricted Mobility: Walking and Moving Around Goal Status [phone]): At least 20 percent but less than 40 percent impaired, limited or restricted    Time: 1424-1502 PT Time Calculation (min): 38 min   Charges:   PT Evaluation $Initial PT Evaluation Tier I: 1 Procedure PT Treatments $Gait Training: 8-22 mins $Therapeutic Exercise: 8-22 mins   PT G Codes:   Functional Assessment Tool Used: clinical judgement Functional Limitation: Mobility: Walking and moving around    Wayne Heightshadwell, [employer] 11/20/2013, 3:19 PM  Lewis ShockAshly Chadwell, PT, DPT Pager #: [phone] Office #: [phone]

## 2013-11-22 ENCOUNTER — Ambulatory Visit: Payer: Self-pay | Admitting: Internal Medicine

## 2014-01-08 ENCOUNTER — Ambulatory Visit: Payer: Self-pay | Admitting: Internal Medicine

## 2014-01-08 ENCOUNTER — Encounter: Payer: Self-pay | Admitting: Internal Medicine

## 2014-01-08 VITALS — BP 124/64 | HR 86 | Ht 69.0 in | Wt 296.0 lb

## 2014-01-08 DIAGNOSIS — G4733 Obstructive sleep apnea (adult) (pediatric): Secondary | ICD-10-CM
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
## 2014-01-08 NOTE — Patient Instructions (Signed)
We can continue CPAP 10   Please call as needed

## 2014-03-07 ENCOUNTER — Encounter (HOSPITAL_COMMUNITY): Payer: Self-pay | Admitting: Emergency Medicine

## 2014-03-07 ENCOUNTER — Emergency Department (HOSPITAL_COMMUNITY)
Admission: EM | Admit: 2014-03-07 | Discharge: 2014-03-08 | Disposition: A | Discharge status: Home / Self Care | Payer: Self-pay | Attending: Emergency Medicine | Admitting: Emergency Medicine

## 2014-03-07 DIAGNOSIS — F329 Major depressive disorder, single episode, unspecified: Secondary | ICD-10-CM | POA: Insufficient documentation

## 2014-03-07 DIAGNOSIS — Z87440 Personal history of urinary (tract) infections: Secondary | ICD-10-CM | POA: Insufficient documentation

## 2014-03-07 DIAGNOSIS — Z8719 Personal history of other diseases of the digestive system: Secondary | ICD-10-CM | POA: Insufficient documentation

## 2014-03-07 DIAGNOSIS — Z87891 Personal history of nicotine dependence: Secondary | ICD-10-CM | POA: Insufficient documentation

## 2014-03-07 DIAGNOSIS — Z79899 Other long term (current) drug therapy: Secondary | ICD-10-CM | POA: Insufficient documentation

## 2014-03-07 DIAGNOSIS — J029 Acute pharyngitis, unspecified: Secondary | ICD-10-CM | POA: Insufficient documentation

## 2014-03-07 DIAGNOSIS — Z8639 Personal history of other endocrine, nutritional and metabolic disease: Secondary | ICD-10-CM | POA: Insufficient documentation

## 2014-03-07 DIAGNOSIS — F419 Anxiety disorder, unspecified: Secondary | ICD-10-CM | POA: Insufficient documentation

## 2014-03-07 NOTE — ED Notes (Signed)
Pt arrived to the ED with a complaint of nasal congestion. Pt states she has had symptoms of congestion, bloody noses for a week.  Pt states that she has sleep apnea and allergies which makes her prone to nasal infections.  Pt also complains of generalized body aches.

## 2014-03-08 MED ORDER — AMOXICILLIN 500 MG PO CAPS: 500.0000 mg | ORAL_CAPSULE | Freq: Three times a day (TID) | ORAL | Status: DC

## 2014-03-08 NOTE — ED Provider Notes (Signed)
CSN: [id number]     Arrival date & time 03/07/14  2245 History   First MD Initiated Contact with Patient 03/07/14 2359     Chief Complaint  Patient presents with  . Nasal Congestion     (Consider location/radiation/quality/duration/timing/severity/associated sxs/prior Treatment) Patient is a 38 y.o. female presenting with pharyngitis. The history is provided by the patient.  Sore Throat This is a recurrent problem. The current episode started in the past 7 days. The problem occurs constantly. The problem has been gradually worsening. Associated symptoms include fatigue and a sore throat. Pertinent negatives include no abdominal pain, fever, headaches, nausea, rash, urinary symptoms or vomiting. She has tried NSAIDs, drinking and acetaminophen for the symptoms. The treatment provided mild relief.   Pt with hx of strep, still has her tonsils but has been advised she should have them removed. She is also having myalgias and feeling tired. She has PMH of sleep apnea which she says makes her more susceptible to throat infections. She is requesting abx Past Medical History  Diagnosis Date  . Depression   . Ovarian failure   . Anxiety   . UTI (lower urinary tract infection)   . Sleep apnea     cpap  . Shortness of breath     exertion from crutches  . GERD (gastroesophageal reflux disease)   . Closed fracture of right distal fibula 11/19/2013   Past Surgical History  Procedure Laterality Date  . Cholecystectomy  2010  . Cholecystectomy    . Wisdom tooth extraction    . Orif fibula fracture Right 11/19/2013    Procedure: OPEN REDUCTION INTERNAL FIXATION (ORIF) RIGHT FIBULA FRACTURE;  Surgeon: Eulas PostJoshua P Landau, MD;  Location: [facility] OR;  Service: Orthopedics;  Laterality: Right;   Family History  Problem Relation Age of Onset  . Allergies Sister   . Heart disease Paternal Grandmother   . Cancer Paternal Grandmother     BREAST CANCER  . Cancer Paternal Grandfather    History  Substance  Use Topics  . Smoking status: Former Smoker    Types: Cigarettes  . Smokeless tobacco: Never Used  . Alcohol Use: Yes     Comment: occasionally   OB History   Grav Para Term Preterm Abortions TAB SAB Ect Mult Living                 Review of Systems  Constitutional: Positive for fatigue. Negative for fever.  HENT: Positive for sore throat.   Gastrointestinal: Negative for nausea, vomiting and abdominal pain.  Skin: Negative for rash.  Neurological: Negative for headaches.     Review of Systems  Gen: no weight loss, fevers, chills, night sweats  Eyes: no occular draining, occular pain,  No visual changes  Nose: + epistaxis or rhinorrhea  Mouth: no dental pain, no sore throat  Neck: no neck pain  Lungs: No hemoptysis. No wheezing or coughing CV:  No palpitations, dependent edema or orthopnea. No chest pain Abd: no diarrhea. No nausea or vomiting, No abdominal pain  GU: no dysuria or gross hematuria  MSK:  No muscle weakness, No muscular pain Neuro: no headache, no focal neurologic deficits  Skin: no rash , no wounds Psyche: no complaints of depression or anxiety    Allergies  Sulfa antibiotics  Home Medications   Prior to Admission medications   Medication Sig Start Date End Date Taking? Authorizing Provider  acetaminophen (TYLENOL) 500 MG tablet Take 1,000 mg by mouth every 6 (six) hours as needed (FOR  PAIN.).   Yes Historical Provider, MD  cetirizine (ZYRTEC) 10 MG tablet Take 10 mg by mouth at bedtime.    Yes Historical Provider, MD  hydrOXYzine (ATARAX/VISTARIL) 25 MG tablet Take 25 mg by mouth 3 (three) times daily as needed for anxiety.   Yes Historical Provider, MD  Multiple Minerals-Vitamins (CAL MAG ZINC +D3) TABS Take 1 tablet by mouth daily.   Yes Historical Provider, MD  sertraline (ZOLOFT) 50 MG tablet Take 50 mg by mouth every morning.   Yes Historical Provider, MD  amoxicillin (AMOXIL) 500 MG capsule Take 1 capsule (500 mg total) by mouth 3 (three) times  daily. 03/08/14   Murle Hellstrom Irine Seal, PA-C   BP 123/61  Pulse 89  Temp(Src) 98.7 F (37.1 C) (Oral)  Resp 16  SpO2 100% Physical Exam  Nursing note and vitals reviewed. Constitutional: She is oriented to person, place, and time. She appears well-developed and well-nourished. No distress.  HENT:  Head: Normocephalic and atraumatic.  Right Ear: Tympanic membrane, external ear and ear canal normal.  Left Ear: Tympanic membrane, external ear and ear canal normal.  Nose: Nose normal. No rhinorrhea. Right sinus exhibits no maxillary sinus tenderness and no frontal sinus tenderness. Left sinus exhibits no maxillary sinus tenderness and no frontal sinus tenderness.  Mouth/Throat: Uvula is midline and mucous membranes are normal. No trismus in the jaw. Normal dentition. No dental abscesses or uvula swelling. Oropharyngeal exudate and posterior oropharyngeal edema present. No posterior oropharyngeal erythema or tonsillar abscesses.  No submental edema, tongue not elevated, no trismus. No impending airway obstruction; Pt able to speak full sentences, swallow intact, no drooling, stridor, or tonsillar/uvula displacement. No palatal petechia  Eyes: Conjunctivae are normal.  Neck: Trachea normal, normal range of motion and full passive range of motion without pain. Neck supple. No rigidity. Normal range of motion present. No Brudzinski's sign noted.  Flexion and extension of neck without pain or difficulty. Able to breath without difficulty in extension.  Cardiovascular: Normal rate and regular rhythm.   Pulmonary/Chest: Effort normal and breath sounds normal. No stridor. No respiratory distress. She has no wheezes.  Abdominal: Soft. There is no tenderness.  No obvious evidence of splenomegaly. Non ttp.   Musculoskeletal: Normal range of motion.  Lymphadenopathy:       Head (right side): No preauricular and no posterior auricular adenopathy present.       Head (left side): No preauricular and no  posterior auricular adenopathy present.    She has cervical adenopathy.  Neurological: She is alert and oriented to person, place, and time.  Skin: Skin is warm and dry. No rash noted. She is not diaphoretic.  Psychiatric: She has a normal mood and affect.    ED Course  Procedures (including critical care time) Labs Review Labs Reviewed - No data to display  Imaging Review No results found.   EKG Interpretation None      MDM   Final diagnoses:  Sore throat   Pt with epistaxis and sore throat. She also has sleep apnea. Reports getting frequent.strep infections. Her PE is abnormal, therefore will refer to ENT, she says she will go and treat with Amoxillin. She appears well.  37 y.o.Vina Posillico's evaluation in the Emergency Department is complete. It has been determined that no acute conditions requiring further emergency intervention are present at this time. The patient/guardian have been advised of the diagnosis and plan. We have discussed signs and symptoms that warrant return to the ED, such as changes or  worsening in symptoms.  Vital signs are stable at discharge. Filed Vitals:   03/07/14 2251  BP: 123/61  Pulse: 89  Temp: 98.7 F (37.1 C)  Resp: 16    Patient/guardian has voiced understanding and agreed to follow-up with the PCP or specialist.    Dorthula Matasiffany G Joron Velis, PA-C 03/08/14 0101

## 2014-03-08 NOTE — Discharge Instructions (Signed)

## 2014-03-09 NOTE — ED Provider Notes (Signed)
Medical screening examination/treatment/procedure(s) were performed by non-physician practitioner and as supervising physician I was immediately available for consultation/collaboration.    Raelle Chambers, MD 03/09/14 0049 

## 2014-05-04 ENCOUNTER — Emergency Department (HOSPITAL_COMMUNITY)
Admission: EM | Admit: 2014-05-04 | Discharge: 2014-05-04 | Disposition: A | Discharge status: Home / Self Care | Payer: Self-pay | Attending: Emergency Medicine | Admitting: Emergency Medicine

## 2014-05-04 ENCOUNTER — Encounter (HOSPITAL_COMMUNITY): Payer: Self-pay | Admitting: Emergency Medicine

## 2014-05-04 DIAGNOSIS — Z79899 Other long term (current) drug therapy: Secondary | ICD-10-CM | POA: Insufficient documentation

## 2014-05-04 DIAGNOSIS — Z8719 Personal history of other diseases of the digestive system: Secondary | ICD-10-CM | POA: Insufficient documentation

## 2014-05-04 DIAGNOSIS — B349 Viral infection, unspecified: Secondary | ICD-10-CM | POA: Insufficient documentation

## 2014-05-04 DIAGNOSIS — Z792 Long term (current) use of antibiotics: Secondary | ICD-10-CM | POA: Insufficient documentation

## 2014-05-04 DIAGNOSIS — F419 Anxiety disorder, unspecified: Secondary | ICD-10-CM | POA: Insufficient documentation

## 2014-05-04 DIAGNOSIS — F329 Major depressive disorder, single episode, unspecified: Secondary | ICD-10-CM | POA: Insufficient documentation

## 2014-05-04 DIAGNOSIS — Z87891 Personal history of nicotine dependence: Secondary | ICD-10-CM | POA: Insufficient documentation

## 2014-05-04 DIAGNOSIS — Z9981 Dependence on supplemental oxygen: Secondary | ICD-10-CM | POA: Insufficient documentation

## 2014-05-04 DIAGNOSIS — Z87440 Personal history of urinary (tract) infections: Secondary | ICD-10-CM | POA: Insufficient documentation

## 2014-05-04 DIAGNOSIS — G4730 Sleep apnea, unspecified: Secondary | ICD-10-CM | POA: Insufficient documentation

## 2014-05-04 DIAGNOSIS — Z8781 Personal history of (healed) traumatic fracture: Secondary | ICD-10-CM | POA: Insufficient documentation

## 2014-05-04 LAB — RAPID STREP SCREEN (MED CTR MEBANE ONLY): STREPTOCOCCUS, GROUP A SCREEN (DIRECT): NEGATIVE

## 2014-05-04 NOTE — ED Notes (Signed)
Pt presents with sore throat cough, congestion, body aches, cough, pt states she may have seen white spots in throat.

## 2014-05-04 NOTE — Discharge Instructions (Signed)
Your strep test is negative. °

## 2014-05-04 NOTE — ED Provider Notes (Signed)
CSN: [id number]     Arrival date & time 05/04/14  1934 History   First MD Initiated Contact with Patient 05/04/14 2044     This chart was scribed for non-physician practitioner, Earley FavorGail Bridie Colquhoun, FNP working with Tilden FossaElizabeth Rees, MD by Arlan OrganAshley Leger, ED Scribe. This patient was seen in room WTR6/WTR6 and the patient's care was started at 10:01 PM.   Chief Complaint  Patient presents with  . Sore Throat   The history is provided by the patient.    HPI Comments: Catherine Koch is a 38 y.o. female with a PMHx of GERD who presents to the Emergency Department complaining of constant, moderate sore throat x 1 week that has progressively worsened. She also reports cough, congestion, and myalgias. Pt has not tried any OTC medications or home remedies without any improvement for symptoms. No recent fever or chills. Pt currently works at home. Pt with known allergy to sulfa antibiotics.  Past Medical History  Diagnosis Date  . Depression   . Ovarian failure   . Anxiety   . UTI (lower urinary tract infection)   . Sleep apnea     cpap  . Shortness of breath     exertion from crutches  . GERD (gastroesophageal reflux disease)   . Closed fracture of right distal fibula 11/19/2013   Past Surgical History  Procedure Laterality Date  . Cholecystectomy  2010  . Cholecystectomy    . Wisdom tooth extraction    . Orif fibula fracture Right 11/19/2013    Procedure: OPEN REDUCTION INTERNAL FIXATION (ORIF) RIGHT FIBULA FRACTURE;  Surgeon: Eulas PostJoshua P Landau, MD;  Location: [facility] OR;  Service: Orthopedics;  Laterality: Right;   Family History  Problem Relation Age of Onset  . Allergies Sister   . Heart disease Paternal Grandmother   . Cancer Paternal Grandmother     BREAST CANCER  . Cancer Paternal Grandfather    History  Substance Use Topics  . Smoking status: Former Smoker    Types: Cigarettes  . Smokeless tobacco: Never Used  . Alcohol Use: Yes     Comment: occasionally   OB History    No data  available     Review of Systems  Constitutional: Negative for fever and chills.  HENT: Positive for congestion, sore throat and voice change.   Respiratory: Positive for cough.   Musculoskeletal: Positive for myalgias.  Skin: Negative for rash.  All other systems reviewed and are negative.     Allergies  Sulfa antibiotics  Home Medications   Prior to Admission medications   Medication Sig Start Date End Date Taking? Authorizing Provider  acetaminophen (TYLENOL) 500 MG tablet Take 1,000 mg by mouth every 6 (six) hours as needed (FOR PAIN.).    Historical Provider, MD  amoxicillin (AMOXIL) 500 MG capsule Take 1 capsule (500 mg total) by mouth 3 (three) times daily. 03/08/14   Tiffany Irine SealG Greene, PA-C  cetirizine (ZYRTEC) 10 MG tablet Take 10 mg by mouth at bedtime.     Historical Provider, MD  hydrOXYzine (ATARAX/VISTARIL) 25 MG tablet Take 25 mg by mouth 3 (three) times daily as needed for anxiety.    Historical Provider, MD  Multiple Minerals-Vitamins (CAL MAG ZINC +D3) TABS Take 1 tablet by mouth daily.    Historical Provider, MD  sertraline (ZOLOFT) 50 MG tablet Take 50 mg by mouth every morning.    Historical Provider, MD   Triage Vitals: BP 116/65 mmHg  Pulse 87  Temp(Src) 98.5 F (36.9 C) (Oral)  Resp 18  Ht 5\' 9"  (1.753 m)  Wt 295 lb (133.811 kg)  BMI 43.54 kg/m2  SpO2 100%   Physical Exam  Constitutional: She is oriented to person, place, and time. She appears well-developed and well-nourished.  HENT:  Head: Normocephalic.  Mouth/Throat: Uvula is midline. Uvula swelling present. Oropharyngeal exudate and posterior oropharyngeal erythema present.  Eyes: EOM are normal.  Neck: Normal range of motion.  Cardiovascular: Normal rate.   Pulmonary/Chest: Effort normal.  Musculoskeletal: Normal range of motion.  Neurological: She is alert and oriented to person, place, and time.  Skin: No rash noted.  Psychiatric: She has a normal mood and affect.  Nursing note and  vitals reviewed.   ED Course  Procedures (including critical care time)  DIAGNOSTIC STUDIES: Oxygen Saturation is 100% on RA, Normal by my interpretation.    COORDINATION OF CARE: 10:01 PM- Will order rapid strep screen. Discussed treatment plan with pt at bedside and pt agreed to plan.     Labs Review Labs Reviewed  RAPID STREP SCREEN  CULTURE, GROUP A STREP    Imaging Review No results found.   EKG Interpretation None      MDM   Final diagnoses:  Viral syndrome       I personally performed the services described in this documentation, which was scribed in my presence. The recorded information has been reviewed and is accurate.    Arman FilterGail K Traycen Goyer, NP 05/04/14 [phone]  Tilden FossaElizabeth Rees, MD 05/05/14 [phone]

## 2014-05-06 LAB — CULTURE, GROUP A STREP

## 2014-05-26 ENCOUNTER — Encounter (HOSPITAL_COMMUNITY): Payer: Self-pay | Admitting: Emergency Medicine

## 2014-05-26 ENCOUNTER — Emergency Department (INDEPENDENT_AMBULATORY_CARE_PROVIDER_SITE_OTHER)
Admission: EM | Admit: 2014-05-26 | Discharge: 2014-05-26 | Disposition: A | Discharge status: Home / Self Care | Payer: BC Managed Care – PPO | Source: Home / Self Care | Attending: Family Medicine | Admitting: Family Medicine

## 2014-05-26 DIAGNOSIS — J40 Bronchitis, not specified as acute or chronic: Secondary | ICD-10-CM

## 2014-05-26 MED ORDER — AZITHROMYCIN 250 MG PO TABS: 250.0000 mg | ORAL_TABLET | Freq: Every day | ORAL | Status: DC

## 2014-05-26 MED ORDER — GUAIFENESIN-CODEINE 100-10 MG/5ML PO SOLN: 5.0000 mL | Freq: Every evening | ORAL | Status: DC | PRN

## 2014-05-26 MED ORDER — IPRATROPIUM BROMIDE 0.06 % NA SOLN: 2.0000 | Freq: Four times a day (QID) | NASAL | Status: DC

## 2014-05-26 MED ORDER — PREDNISONE 10 MG PO TABS: 30.0000 mg | ORAL_TABLET | Freq: Every day | ORAL | Status: DC

## 2014-05-26 NOTE — ED Provider Notes (Signed)
Catherine Koch is a 39 y.o. female who presents to Urgent Care today for cough sore throat and body aches nasal discharge coarse voice congestion. Symptoms present for a week. No vomiting. No chest pain or palpitations. Patient frequently gets sinus infections. She takes Mucinex and Tylenol which helps a bit.   Past Medical History  Diagnosis Date  . Depression   . Ovarian failure   . Anxiety   . UTI (lower urinary tract infection)   . Sleep apnea     cpap  . Shortness of breath     exertion from crutches  . GERD (gastroesophageal reflux disease)   . Closed fracture of right distal fibula 11/19/2013   Past Surgical History  Procedure Laterality Date  . Cholecystectomy  2010  . Cholecystectomy    . Wisdom tooth extraction    . Orif fibula fracture Right 11/19/2013    Procedure: OPEN REDUCTION INTERNAL FIXATION (ORIF) RIGHT FIBULA FRACTURE;  Surgeon: Eulas Post, MD;  Location: [facility] OR;  Service: Orthopedics;  Laterality: Right;   History  Substance Use Topics  . Smoking status: Former Smoker    Types: Cigarettes  . Smokeless tobacco: Never Used  . Alcohol Use: Yes     Comment: occasionally   ROS as above Medications: No current facility-administered medications for this encounter.   Current Outpatient Prescriptions  Medication Sig Dispense Refill  . acetaminophen (TYLENOL) 500 MG tablet Take 1,000 mg by mouth every 6 (six) hours as needed (FOR PAIN.).    Marland Kitchen amoxicillin (AMOXIL) 500 MG capsule Take 1 capsule (500 mg total) by mouth 3 (three) times daily. 21 capsule 0  . azithromycin (ZITHROMAX) 250 MG tablet Take 1 tablet (250 mg total) by mouth daily. Take first 2 tablets together, then 1 every day until finished. 6 tablet 0  . cetirizine (ZYRTEC) 10 MG tablet Take 10 mg by mouth at bedtime.     Marland Kitchen guaiFENesin-codeine 100-10 MG/5ML syrup Take 5 mLs by mouth at bedtime as needed for cough. 120 mL 0  . hydrOXYzine (ATARAX/VISTARIL) 25 MG tablet Take 25 mg by mouth 3 (three)  times daily as needed for anxiety.    Marland Kitchen ipratropium (ATROVENT) 0.06 % nasal spray Place 2 sprays into both nostrils 4 (four) times daily. 15 mL 1  . Multiple Minerals-Vitamins (CAL MAG ZINC +D3) TABS Take 1 tablet by mouth daily.    . predniSONE (DELTASONE) 10 MG tablet Take 3 tablets (30 mg total) by mouth daily. 15 tablet 0  . sertraline (ZOLOFT) 50 MG tablet Take 50 mg by mouth every morning.     Allergies  Allergen Reactions  . Sulfa Antibiotics Anaphylaxis, Swelling and Rash    Throat swelling/ rashes     Exam:  BP 117/79 mmHg  Pulse 71  Temp(Src) 98.1 F (36.7 C) (Oral)  Resp 20  SpO2 100% Gen: Well NAD morbidly obese HEENT: EOMI,  MMM posterior pharynx with cobblestoning. Normal hepatic membranes bilaterally Lungs: Normal work of breathing. CTABL Heart: RRR no MRG Abd: NABS, Soft. Nondistended, Nontender Exts: Brisk capillary refill, warm and well perfused.   No results found for this or any previous visit (from the past 24 hour(s)). No results found.  Assessment and Plan: 39 y.o. female with bronchitis. Treatment with prednisone Atrovent nasal spray and codeine containing cough medication. Use azithromycin if not better.  Discussed warning signs or symptoms. Please see discharge instructions. Patient expresses understanding.     Rodolph Bong, MD 05/26/14 [phone]

## 2014-05-26 NOTE — ED Notes (Signed)
Patient c/o chest and nasal congestion on and off for the past week. Patient reports she has taken Mucinex and Tylenol for fever. Patient also reports she had an episode of epistaxis. Patient is in NAD.

## 2014-05-26 NOTE — Discharge Instructions (Signed)
Thank you for coming in today. Take prednisone and use Atrovent nasal spray. Use codeine containing cough medication as needed. Do not take and drive. Use azithromycin antibiotics if not getting better. '  Acute Bronchitis Bronchitis is inflammation of the airways that extend from the windpipe into the lungs (bronchi). The inflammation often causes mucus to develop. This leads to a cough, which is the most common symptom of bronchitis.  In acute bronchitis, the condition usually develops suddenly and goes away over time, usually in a couple weeks. Smoking, allergies, and asthma can make bronchitis worse. Repeated episodes of bronchitis may cause further lung problems.  CAUSES Acute bronchitis is most often caused by the same virus that causes a cold. The virus can spread from person to person (contagious) through coughing, sneezing, and touching contaminated objects. SIGNS AND SYMPTOMS   Cough.   Fever.   Coughing up mucus.   Body aches.   Chest congestion.   Chills.   Shortness of breath.   Sore throat.  DIAGNOSIS  Acute bronchitis is usually diagnosed through a physical exam. Your health care provider will also ask you questions about your medical history. Tests, such as chest X-rays, are sometimes done to rule out other conditions.  TREATMENT  Acute bronchitis usually goes away in a couple weeks. Oftentimes, no medical treatment is necessary. Medicines are sometimes given for relief of fever or cough. Antibiotic medicines are usually not needed but may be prescribed in certain situations. In some cases, an inhaler may be recommended to help reduce shortness of breath and control the cough. A cool mist vaporizer may also be used to help thin bronchial secretions and make it easier to clear the chest.  HOME CARE INSTRUCTIONS  Get plenty of rest.   Drink enough fluids to keep your urine clear or pale yellow (unless you have a medical condition that requires fluid  restriction). Increasing fluids may help thin your respiratory secretions (sputum) and reduce chest congestion, and it will prevent dehydration.   Take medicines only as directed by your health care provider.  If you were prescribed an antibiotic medicine, finish it all even if you start to feel better.  Avoid smoking and secondhand smoke. Exposure to cigarette smoke or irritating chemicals will make bronchitis worse. If you are a smoker, consider using nicotine gum or skin patches to help control withdrawal symptoms. Quitting smoking will help your lungs heal faster.   Reduce the chances of another bout of acute bronchitis by washing your hands frequently, avoiding people with cold symptoms, and trying not to touch your hands to your mouth, nose, or eyes.   Keep all follow-up visits as directed by your health care provider.  SEEK MEDICAL CARE IF: Your symptoms do not improve after 1 week of treatment.  SEEK IMMEDIATE MEDICAL CARE IF:  You develop an increased fever or chills.   You have chest pain.   You have severe shortness of breath.  You have bloody sputum.   You develop dehydration.  You faint or repeatedly feel like you are going to pass out.  You develop repeated vomiting.  You develop a severe headache. MAKE SURE YOU:   Understand these instructions.  Will watch your condition.  Will get help right away if you are not doing well or get worse. Document Released: 06/17/2004 Document Revised: 09/24/2013 Document Reviewed: 10/31/2012 [facility] Patient Information 2015 [place], [place]. This information is not intended to replace advice given to you by your health care provider. Make sure you  discuss any questions you have with your health care provider. ° °

## 2014-08-11 ENCOUNTER — Emergency Department (HOSPITAL_COMMUNITY)
Admission: EM | Admit: 2014-08-11 | Discharge: 2014-08-11 | Disposition: A | Discharge status: Home / Self Care | Payer: Self-pay | Attending: Emergency Medicine | Admitting: Emergency Medicine

## 2014-08-11 ENCOUNTER — Encounter (HOSPITAL_COMMUNITY): Payer: Self-pay | Admitting: *Deleted

## 2014-08-11 DIAGNOSIS — Z8639 Personal history of other endocrine, nutritional and metabolic disease: Secondary | ICD-10-CM | POA: Insufficient documentation

## 2014-08-11 DIAGNOSIS — F419 Anxiety disorder, unspecified: Secondary | ICD-10-CM | POA: Insufficient documentation

## 2014-08-11 DIAGNOSIS — Z87440 Personal history of urinary (tract) infections: Secondary | ICD-10-CM | POA: Insufficient documentation

## 2014-08-11 DIAGNOSIS — J329 Chronic sinusitis, unspecified: Secondary | ICD-10-CM | POA: Insufficient documentation

## 2014-08-11 DIAGNOSIS — Z8719 Personal history of other diseases of the digestive system: Secondary | ICD-10-CM | POA: Insufficient documentation

## 2014-08-11 DIAGNOSIS — Z8781 Personal history of (healed) traumatic fracture: Secondary | ICD-10-CM | POA: Insufficient documentation

## 2014-08-11 DIAGNOSIS — Z79899 Other long term (current) drug therapy: Secondary | ICD-10-CM | POA: Insufficient documentation

## 2014-08-11 DIAGNOSIS — F329 Major depressive disorder, single episode, unspecified: Secondary | ICD-10-CM | POA: Insufficient documentation

## 2014-08-11 DIAGNOSIS — Z9981 Dependence on supplemental oxygen: Secondary | ICD-10-CM | POA: Insufficient documentation

## 2014-08-11 DIAGNOSIS — Z87891 Personal history of nicotine dependence: Secondary | ICD-10-CM | POA: Insufficient documentation

## 2014-08-11 MED ORDER — PSEUDOEPHEDRINE HCL ER 120 MG PO TB12: 120.0000 mg | ORAL_TABLET | Freq: Two times a day (BID) | ORAL | Status: DC

## 2014-08-11 NOTE — ED Provider Notes (Signed)
CSN: [id number]     Arrival date & time 08/11/14  2027 History   None    This chart was scribed for non-physician practitioner, Earley FavorGail Jalin Erpelding, FNP working with Cathren LaineKevin Steinl, MD by Arlan OrganAshley Leger, ED Scribe. This patient was seen in room WTR7/WTR7 and the patient's care was started at 9:19 PM.   Chief Complaint  Patient presents with  . Sinus pain    The history is provided by the patient. No language interpreter was used.    HPI Comments: Catherine Koch is a 39 y.o. female with a PMHx of depression, seasonal allergies, anxiety, and GERD who presents to the Emergency Department complaining of sinus pain x 2 days. Pt also reports congestion, fever of 100.5 last night, and postnasal drip. She has not tried any OTC Mucinex without any improvement for symptoms. No recent fever or chills. Last yearly physical 2 weeks ago. Pt with known allergy to Sulfa Antibiotics.  [facility]  Past Medical History  Diagnosis Date  . Depression   . Ovarian failure   . Anxiety   . UTI (lower urinary tract infection)   . Sleep apnea     cpap  . Shortness of breath     exertion from crutches  . GERD (gastroesophageal reflux disease)   . Closed fracture of right distal fibula 11/19/2013   Past Surgical History  Procedure Laterality Date  . Cholecystectomy  2010  . Cholecystectomy    . Wisdom tooth extraction    . Orif fibula fracture Right 11/19/2013    Procedure: OPEN REDUCTION INTERNAL FIXATION (ORIF) RIGHT FIBULA FRACTURE;  Surgeon: Eulas PostJoshua P Landau, MD;  Location: [facility] OR;  Service: Orthopedics;  Laterality: Right;   Family History  Problem Relation Age of Onset  . Allergies Sister   . Heart disease Paternal Grandmother   . Cancer Paternal Grandmother     BREAST CANCER  . Cancer Paternal Grandfather    History  Substance Use Topics  . Smoking status: Former Smoker    Types: Cigarettes  . Smokeless tobacco: Never Used  . Alcohol Use: Yes     Comment:  occasionally   OB History    No data available     Review of Systems  Constitutional: Negative for fever and chills.  HENT: Positive for congestion, postnasal drip and sinus pressure.       Allergies  Sulfa antibiotics  Home Medications   Prior to Admission medications   Medication Sig Start Date End Date Taking? Authorizing Provider  acetaminophen (TYLENOL) 500 MG tablet Take 1,000 mg by mouth every 6 (six) hours as needed (FOR PAIN.).   Yes Historical Provider, MD  cetirizine (ZYRTEC) 10 MG tablet Take 10 mg by mouth at bedtime.    Yes Historical Provider, MD  hydrOXYzine (ATARAX/VISTARIL) 25 MG tablet Take 25 mg by mouth 3 (three) times daily as needed for anxiety.   Yes Historical Provider, MD  sertraline (ZOLOFT) 100 MG tablet Take 100 mg by mouth daily.   Yes Historical Provider, MD  amoxicillin (AMOXIL) 500 MG capsule Take 1 capsule (500 mg total) by mouth 3 (three) times daily. Patient not taking: Reported on 08/11/2014 03/08/14   Marlon Peliffany Greene, PA-C  azithromycin (ZITHROMAX) 250 MG tablet Take 1 tablet (250 mg total) by mouth daily. Take first 2 tablets together, then 1 every day until finished. Patient not taking: Reported on 08/11/2014 05/26/14   Rodolph BongEvan S Corey, MD  guaiFENesin-codeine 100-10 MG/5ML syrup Take 5 mLs  by mouth at bedtime as needed for cough. Patient not taking: Reported on 08/11/2014 05/26/14   Rodolph Bong, MD  ipratropium (ATROVENT) 0.06 % nasal spray Place 2 sprays into both nostrils 4 (four) times daily. Patient not taking: Reported on 08/11/2014 05/26/14   Rodolph Bong, MD  predniSONE (DELTASONE) 10 MG tablet Take 3 tablets (30 mg total) by mouth daily. Patient not taking: Reported on 08/11/2014 05/26/14   Rodolph Bong, MD  pseudoephedrine (SUDAFED 12 HOUR) 120 MG 12 hr tablet Take 1 tablet (120 mg total) by mouth 2 (two) times daily. 08/11/14   Earley Favor, NP   Triage Vitals: BP 132/72 mmHg  Pulse 87  Temp(Src) 98.5 F (36.9 C) (Oral)  Resp 20  SpO2 98%    Physical Exam  Constitutional: She is oriented to person, place, and time. She appears well-developed and well-nourished.  HENT:  Head: Normocephalic.  Eyes: EOM are normal.  Neck: Normal range of motion.  Pulmonary/Chest: Effort normal.  Abdominal: She exhibits no distension.  Musculoskeletal: Normal range of motion.  Neurological: She is alert and oriented to person, place, and time.  Psychiatric: She has a normal mood and affect.  Nursing note and vitals reviewed.   ED Course  Procedures (including critical care time)  DIAGNOSTIC STUDIES: Oxygen Saturation is 98% on RA, Normal by my interpretation.    COORDINATION OF CARE: 9:29 PM-Discussed treatment plan with pt at bedside and pt agreed to plan.     Labs Review Labs Reviewed - No data to display  Imaging Review No results found.   EKG Interpretation None      MDM   Final diagnoses:  Chronic sinusitis, unspecified location    I personally performed the services described in this documentation, which was scribed in my presence. The recorded information has been reviewed and is accurate.  Earley Favor, NP 08/11/14 2129  Cathren Laine, MD 08/13/14 [phone]

## 2014-08-11 NOTE — ED Notes (Signed)
Patient with c/o sinus pain and possible sinus infection Patient also reports congestion Patient in NAD upon assessment

## 2014-08-11 NOTE — Discharge Instructions (Signed)
Sinusitis °Sinusitis is redness, soreness, and inflammation of the paranasal sinuses. Paranasal sinuses are air pockets within the bones of your face (beneath the eyes, the middle of the forehead, or above the eyes). In healthy paranasal sinuses, mucus is able to drain out, and air is able to circulate through them by way of your nose. However, when your paranasal sinuses are inflamed, mucus and air can become trapped. This can allow bacteria and other germs to grow and cause infection. °Sinusitis can develop quickly and last only a short time (acute) or continue over a long period (chronic). Sinusitis that lasts for more than 12 weeks is considered chronic.  °CAUSES  °Causes of sinusitis include: °· Allergies. °· Structural abnormalities, such as displacement of the cartilage that separates your nostrils (deviated septum), which can decrease the air flow through your nose and sinuses and affect sinus drainage. °· Functional abnormalities, such as when the small hairs (cilia) that line your sinuses and help remove mucus do not work properly or are not present. °SIGNS AND SYMPTOMS  °Symptoms of acute and chronic sinusitis are the same. The primary symptoms are pain and pressure around the affected sinuses. Other symptoms include: °· Upper toothache. °· Earache. °· Headache. °· Bad breath. °· Decreased sense of smell and taste. °· A cough, which worsens when you are lying flat. °· Fatigue. °· Fever. °· Thick drainage from your nose, which often is green and may contain pus (purulent). °· Swelling and warmth over the affected sinuses. °DIAGNOSIS  °Your health care provider will perform a physical exam. During the exam, your health care provider may: °· Look in your nose for signs of abnormal growths in your nostrils (nasal polyps). °· Tap over the affected sinus to check for signs of infection. °· View the inside of your sinuses (endoscopy) using an imaging device that has a light attached (endoscope). °If your health  care provider suspects that you have chronic sinusitis, one or more of the following tests may be recommended: °· Allergy tests. °· Nasal culture. A sample of mucus is taken from your nose, sent to a lab, and screened for bacteria. °· Nasal cytology. A sample of mucus is taken from your nose and examined by your health care provider to determine if your sinusitis is related to an allergy. °TREATMENT  °Most cases of acute sinusitis are related to a viral infection and will resolve on their own within 10 days. Sometimes medicines are prescribed to help relieve symptoms (pain medicine, decongestants, nasal steroid sprays, or saline sprays).  °However, for sinusitis related to a bacterial infection, your health care provider will prescribe antibiotic medicines. These are medicines that will help kill the bacteria causing the infection.  °Rarely, sinusitis is caused by a fungal infection. In theses cases, your health care provider will prescribe antifungal medicine. °For some cases of chronic sinusitis, surgery is needed. Generally, these are cases in which sinusitis recurs more than 3 times per year, despite other treatments. °HOME CARE INSTRUCTIONS  °· Drink plenty of water. Water helps thin the mucus so your sinuses can drain more easily. °· Use a humidifier. °· Inhale steam 3 to 4 times a day (for example, sit in the bathroom with the shower running). °· Apply a warm, moist washcloth to your face 3 to 4 times a day, or as directed by your health care provider. °· Use saline nasal sprays to help moisten and clean your sinuses. °· Take medicines only as directed by your health care provider. °·   If you were prescribed either an antibiotic or antifungal medicine, finish it all even if you start to feel better. SEEK IMMEDIATE MEDICAL CARE IF:  You have increasing pain or severe headaches.  You have nausea, vomiting, or drowsiness.  You have swelling around your face.  You have vision problems.  You have a stiff  neck.  You have difficulty breathing. MAKE SURE YOU:   Understand these instructions.  Will watch your condition.  Will get help right away if you are not doing well or get worse. Document Released: 05/10/2005 Document Revised: 09/24/2013 Document Reviewed: 05/25/2011 [facility] Patient Information 2015 [place], [place]. This information is not intended to replace advice given to you by your health care provider. Make sure you discuss any questions you have with your health care provider. Take a decongestant as directed.  Make an appointment with your primary care physician for follow-up.  You've also been given a referral to ENT

## 2014-09-03 ENCOUNTER — Ambulatory Visit (INDEPENDENT_AMBULATORY_CARE_PROVIDER_SITE_OTHER): Payer: BLUE CROSS/BLUE SHIELD | Admitting: Physician Assistant

## 2014-09-03 VITALS — BP 126/82 | HR 72 | Temp 98.4°F | Resp 17 | Ht 69.0 in | Wt 298.0 lb

## 2014-09-03 DIAGNOSIS — J019 Acute sinusitis, unspecified: Secondary | ICD-10-CM

## 2014-09-03 MED ORDER — AMOXICILLIN 875 MG PO TABS: 875.0000 mg | ORAL_TABLET | Freq: Two times a day (BID) | ORAL | Status: AC

## 2014-09-03 NOTE — Progress Notes (Signed)
Subjective:    Patient ID: Catherine KayserJanine Koch, female    DOB: 02/16/1976, 39 y.o.   MRN: [id number]  HPI  This is a 39 year old female who is presenting with hoarseness, fatigue, sinus pressure and myalgias x 1 week. Reports this feels like a sinus infection to her. States she knows she is getting a sinus infection when she starts feeling more fatigued. Has felt very cold today but hasn't checked temperature. Has some sinus pressure. Minimal nasal discharge but feels congested. Has been hoarse. Has appt in 1 week with ENT for recurrent sinus infections and recurrent pharyngitis. States "I want to get my tonsils out". Denies cough, SOB or wheezing. She has environmental allergies which she takes flonase and zyrtec for every day. Has been taking sudafed off and on over the past week for her sinus pressure and helping some.  Review of Systems  Constitutional: Positive for chills and fatigue. Negative for fever.  HENT: Positive for congestion and sinus pressure. Negative for ear pain and sore throat.   Eyes: Negative for redness.  Respiratory: Negative for cough, shortness of breath and wheezing.   Gastrointestinal: Negative for nausea, vomiting and abdominal pain.  Musculoskeletal: Positive for myalgias.  Skin: Negative for rash.  Allergic/Immunologic: Positive for environmental allergies.  Hematological: Negative for adenopathy.    Patient Active Problem List   Diagnosis Date Noted  . Closed fracture of right distal fibula 11/19/2013  . S/P ORIF (open reduction internal fixation) fracture 11/19/2013  . Obesity, morbid 06/17/2013  . Chronic rhinitis 06/17/2013  . Acute maxillary sinusitis 06/03/2012  . Obstructive sleep apnea 01/15/2012  . Depression 01/15/2012   Prior to Admission medications   Medication Sig Start Date End Date Taking? Authorizing Provider  cetirizine (ZYRTEC) 10 MG tablet Take 10 mg by mouth at bedtime.    Yes Historical Provider, MD  fluticasone (FLONASE) 50 MCG/ACT  nasal spray Place 2 sprays into both nostrils daily.   Yes Historical Provider, MD  hydrOXYzine (ATARAX/VISTARIL) 25 MG tablet Take 25 mg by mouth 3 (three) times daily as needed for anxiety.   Yes Historical Provider, MD  pseudoephedrine (SUDAFED 12 HOUR) 120 MG 12 hr tablet Take 1 tablet (120 mg total) by mouth 2 (two) times daily. 08/11/14  Yes Earley FavorGail Schulz, NP  sertraline (ZOLOFT) 100 MG tablet Take 100 mg by mouth daily.   Yes Historical Provider, MD   Allergies  Allergen Reactions  . Sulfa Antibiotics Anaphylaxis, Swelling and Rash    Throat swelling/ rashes   Patient's social and family history were reviewed.     Objective:   Physical Exam  Constitutional: She is oriented to person, place, and time. She appears well-developed and well-nourished. No distress.  HENT:  Head: Normocephalic and atraumatic.  Right Ear: Hearing, tympanic membrane, external ear and ear canal normal.  Left Ear: Hearing, tympanic membrane, external ear and ear canal normal.  Nose: Right sinus exhibits maxillary sinus tenderness and frontal sinus tenderness. Left sinus exhibits maxillary sinus tenderness and frontal sinus tenderness.  Mouth/Throat: Uvula is midline and mucous membranes are normal. Posterior oropharyngeal erythema present. No oropharyngeal exudate or posterior oropharyngeal edema.  Maxillary tenderness > frontal tenderness Nasal mucosa dry and bloody  Eyes: Conjunctivae and lids are normal. Right eye exhibits no discharge. Left eye exhibits no discharge. No scleral icterus.  Cardiovascular: Normal rate, regular rhythm, normal heart sounds and normal pulses.   No murmur heard. Pulmonary/Chest: Effort normal and breath sounds normal. No respiratory distress. She has no  wheezes. She has no rhonchi. She has no rales.  Musculoskeletal: Normal range of motion.  Lymphadenopathy:       Head (right side): No submental, no submandibular and no tonsillar adenopathy present.       Head (left side): No  submental, no submandibular and no tonsillar adenopathy present.    She has no cervical adenopathy.  Neurological: She is alert and oriented to person, place, and time.  Skin: Skin is warm, dry and intact. No lesion and no rash noted.  Psychiatric: She has a normal mood and affect. Her speech is normal and behavior is normal. Thought content normal.   BP 126/82 mmHg  Pulse 72  Temp(Src) 98.4 F (36.9 C) (Oral)  Resp 17  Ht  (1.753 m)  Wt 298 lb (135.172 kg)  BMI 43.99 kg/m2  SpO2 93%     Assessment & Plan:  1. Acute sinusitis, recurrence not specified, unspecified location Amoxicillin for sinus infection. She will continue daily flonase. Follow up with ENT in 1 week. - amoxicillin (AMOXIL) 875 MG tablet; Take 1 tablet (875 mg total) by mouth 2 (two) times daily.  Dispense: 20 tablet; Refill: 0   [place] [employer]. Dyke Brackett, MHS [facility]  09/03/2014

## 2014-09-03 NOTE — Patient Instructions (Signed)
Take antibiotic twice a day for 10 days. Continue daily zyrtec and flonase. You can continue to take sudafed as needed for sinus pressure. Follow up with ENT next week.

## 2014-10-12 ENCOUNTER — Encounter (HOSPITAL_COMMUNITY): Payer: Self-pay | Admitting: *Deleted

## 2014-10-12 ENCOUNTER — Emergency Department (HOSPITAL_COMMUNITY)
Admission: EM | Admit: 2014-10-12 | Discharge: 2014-10-12 | Disposition: A | Discharge status: Home / Self Care | Payer: BLUE CROSS/BLUE SHIELD | Attending: Emergency Medicine | Admitting: Emergency Medicine

## 2014-10-12 DIAGNOSIS — Z79899 Other long term (current) drug therapy: Secondary | ICD-10-CM | POA: Insufficient documentation

## 2014-10-12 DIAGNOSIS — Z3202 Encounter for pregnancy test, result negative: Secondary | ICD-10-CM | POA: Diagnosis not present

## 2014-10-12 DIAGNOSIS — R42 Dizziness and giddiness: Secondary | ICD-10-CM | POA: Diagnosis not present

## 2014-10-12 DIAGNOSIS — Z87891 Personal history of nicotine dependence: Secondary | ICD-10-CM | POA: Insufficient documentation

## 2014-10-12 DIAGNOSIS — Z87440 Personal history of urinary (tract) infections: Secondary | ICD-10-CM | POA: Insufficient documentation

## 2014-10-12 DIAGNOSIS — M545 Low back pain, unspecified: Secondary | ICD-10-CM

## 2014-10-12 DIAGNOSIS — Z8781 Personal history of (healed) traumatic fracture: Secondary | ICD-10-CM | POA: Diagnosis not present

## 2014-10-12 DIAGNOSIS — R630 Anorexia: Secondary | ICD-10-CM | POA: Diagnosis not present

## 2014-10-12 DIAGNOSIS — M791 Myalgia: Secondary | ICD-10-CM | POA: Diagnosis not present

## 2014-10-12 DIAGNOSIS — Z8719 Personal history of other diseases of the digestive system: Secondary | ICD-10-CM | POA: Diagnosis not present

## 2014-10-12 DIAGNOSIS — Z8639 Personal history of other endocrine, nutritional and metabolic disease: Secondary | ICD-10-CM | POA: Insufficient documentation

## 2014-10-12 DIAGNOSIS — R110 Nausea: Secondary | ICD-10-CM | POA: Diagnosis present

## 2014-10-12 DIAGNOSIS — R1013 Epigastric pain: Secondary | ICD-10-CM | POA: Insufficient documentation

## 2014-10-12 DIAGNOSIS — Z8669 Personal history of other diseases of the nervous system and sense organs: Secondary | ICD-10-CM | POA: Insufficient documentation

## 2014-10-12 DIAGNOSIS — Z7951 Long term (current) use of inhaled steroids: Secondary | ICD-10-CM | POA: Insufficient documentation

## 2014-10-12 DIAGNOSIS — Z8659 Personal history of other mental and behavioral disorders: Secondary | ICD-10-CM | POA: Insufficient documentation

## 2014-10-12 DIAGNOSIS — B349 Viral infection, unspecified: Secondary | ICD-10-CM | POA: Diagnosis not present

## 2014-10-12 LAB — COMPREHENSIVE METABOLIC PANEL
ALBUMIN: 4 g/dL (ref 3.5–5.0)
ALT: 50 U/L (ref 14–54)
ANION GAP: 10 (ref 5–15)
AST: 36 U/L (ref 15–41)
Alkaline Phosphatase: 67 U/L (ref 38–126)
BUN: 13 mg/dL (ref 6–20)
CALCIUM: 9.1 mg/dL (ref 8.9–10.3)
CO2: 24 mmol/L (ref 22–32)
Chloride: 106 mmol/L (ref 101–111)
Creatinine, Ser: 0.86 mg/dL (ref 0.44–1.00)
GFR calc Af Amer: >60 mL/min (ref >60)
GFR calc non Af Amer: >60 mL/min (ref >60)
Glucose, Bld: 105 mg/dL — ABNORMAL HIGH (ref 65–99)
Potassium: 3.7 mmol/L (ref 3.5–5.1)
Sodium: 140 mmol/L (ref 135–145)
Total Bilirubin: 0.3 mg/dL (ref 0.3–1.2)
Total Protein: 7.3 g/dL (ref 6.5–8.1)

## 2014-10-12 LAB — CBC WITH DIFFERENTIAL/PLATELET
BASOS ABS: 0 10*3/uL (ref 0.0–0.1)
BASOS PCT: 1 % (ref 0–1)
Eosinophils Absolute: 0.1 10*3/uL (ref 0.0–0.7)
Eosinophils Relative: 2 % (ref 0–5)
HCT: 39.9 % (ref 36.0–46.0)
HEMOGLOBIN: 13.1 g/dL (ref 12.0–15.0)
Lymphocytes Relative: 35 % (ref 12–46)
Lymphs Abs: 2.3 10*3/uL (ref 0.7–4.0)
MCH: 29.4 pg (ref 26.0–34.0)
MCHC: 32.8 g/dL (ref 30.0–36.0)
MCV: 89.7 fL (ref 78.0–100.0)
MONO ABS: 0.4 10*3/uL (ref 0.1–1.0)
Monocytes Relative: 6 % (ref 3–12)
Neutro Abs: 3.6 10*3/uL (ref 1.7–7.7)
Neutrophils Relative %: 56 % (ref 43–77)
PLATELETS: 263 10*3/uL (ref 150–400)
RBC: 4.45 MIL/uL (ref 3.87–5.11)
RDW: 13.6 % (ref 11.5–15.5)
WBC: 6.4 10*3/uL (ref 4.0–10.5)

## 2014-10-12 LAB — URINALYSIS, ROUTINE W REFLEX MICROSCOPIC
BILIRUBIN URINE: NEGATIVE
Glucose, UA: NEGATIVE mg/dL
Hgb urine dipstick: NEGATIVE
Ketones, ur: NEGATIVE mg/dL
Leukocytes, UA: NEGATIVE
NITRITE: NEGATIVE
PH: 6 (ref 5.0–8.0)
Protein, ur: NEGATIVE mg/dL
Specific Gravity, Urine: 1.028 (ref 1.005–1.030)
UROBILINOGEN UA: 0.2 mg/dL (ref 0.0–1.0)

## 2014-10-12 LAB — LIPASE, BLOOD: Lipase: 28 U/L (ref 22–51)

## 2014-10-12 LAB — POC URINE PREG, ED: PREG TEST UR: NEGATIVE

## 2014-10-12 MED ORDER — ONDANSETRON HCL 4 MG/2ML IJ SOLN
4.0000 mg | Freq: Once | INTRAMUSCULAR | Status: AC
  Administered 2014-10-12: 4 mg via INTRAVENOUS
  Filled 2014-10-12: qty 2

## 2014-10-12 MED ORDER — METHOCARBAMOL 500 MG PO TABS: 500.0000 mg | ORAL_TABLET | Freq: Two times a day (BID) | ORAL | Status: DC

## 2014-10-12 MED ORDER — PANTOPRAZOLE SODIUM 40 MG IV SOLR
40.0000 mg | Freq: Once | INTRAVENOUS | Status: AC
  Administered 2014-10-12: 40 mg via INTRAVENOUS
  Filled 2014-10-12: qty 40

## 2014-10-12 MED ORDER — IBUPROFEN 800 MG PO TABS: 800.0000 mg | ORAL_TABLET | Freq: Three times a day (TID) | ORAL | Status: DC

## 2014-10-12 MED ORDER — SODIUM CHLORIDE 0.9 % IV BOLUS (SEPSIS)
2000.0000 mL | Freq: Once | INTRAVENOUS | Status: AC
  Administered 2014-10-12: 2000 mL via INTRAVENOUS

## 2014-10-12 MED ORDER — ACETAMINOPHEN 500 MG PO TABS
1000.0000 mg | ORAL_TABLET | Freq: Once | ORAL | Status: AC
  Administered 2014-10-12: 1000 mg via ORAL
  Filled 2014-10-12: qty 2

## 2014-10-12 MED ORDER — ONDANSETRON HCL 4 MG PO TABS: 4.0000 mg | ORAL_TABLET | Freq: Three times a day (TID) | ORAL | Status: DC | PRN

## 2014-10-12 MED ORDER — GI COCKTAIL ~~LOC~~
30.0000 mL | Freq: Once | ORAL | Status: AC
  Administered 2014-10-12: 30 mL via ORAL
  Filled 2014-10-12: qty 30

## 2014-10-12 NOTE — Discharge Instructions (Signed)
Viral Gastroenteritis °Viral gastroenteritis is also known as stomach flu. This condition affects the stomach and intestinal tract. It can cause sudden diarrhea and vomiting. The illness typically lasts 3 to 8 days. Most people develop an immune response that eventually gets rid of the virus. While this natural response develops, the virus can make you quite ill. °CAUSES  °Many different viruses can cause gastroenteritis, such as rotavirus or noroviruses. You can catch one of these viruses by consuming contaminated food or water. You may also catch a virus by sharing utensils or other personal items with an infected person or by touching a contaminated surface. °SYMPTOMS  °The most common symptoms are diarrhea and vomiting. These problems can cause a severe loss of body fluids (dehydration) and a body salt (electrolyte) imbalance. Other symptoms may include: °· Fever. °· Headache. °· Fatigue. °· Abdominal pain. °DIAGNOSIS  °Your caregiver can usually diagnose viral gastroenteritis based on your symptoms and a physical exam. A stool sample may also be taken to test for the presence of viruses or other infections. °TREATMENT  °This illness typically goes away on its own. Treatments are aimed at rehydration. The most serious cases of viral gastroenteritis involve vomiting so severely that you are not able to keep fluids down. In these cases, fluids must be given through an intravenous line (IV). °HOME CARE INSTRUCTIONS  °· Drink enough fluids to keep your urine clear or pale yellow. Drink small amounts of fluids frequently and increase the amounts as tolerated. °· Ask your caregiver for specific rehydration instructions. °· Avoid: °¨ Foods high in sugar. °¨ Alcohol. °¨ Carbonated drinks. °¨ Tobacco. °¨ Juice. °¨ Caffeine drinks. °¨ Extremely hot or cold fluids. °¨ Fatty, greasy foods. °¨ Too much intake of anything at one time. °¨ Dairy products until 24 to 48 hours after diarrhea stops. °· You may consume probiotics.  Probiotics are active cultures of beneficial bacteria. They may lessen the amount and number of diarrheal stools in adults. Probiotics can be found in yogurt with active cultures and in supplements. °· Wash your hands well to avoid spreading the virus. °· Only take over-the-counter or prescription medicines for pain, discomfort, or fever as directed by your caregiver. Do not give aspirin to children. Antidiarrheal medicines are not recommended. °· Ask your caregiver if you should continue to take your regular prescribed and over-the-counter medicines. °· Keep all follow-up appointments as directed by your caregiver. °SEEK IMMEDIATE MEDICAL CARE IF:  °· You are unable to keep fluids down. °· You do not urinate at least once every 6 to 8 hours. °· You develop shortness of breath. °· You notice blood in your stool or vomit. This may look like coffee grounds. °· You have abdominal pain that increases or is concentrated in one small area (localized). °· You have persistent vomiting or diarrhea. °· You have a fever. °· The patient is a child younger than 3 months, and he or she has a fever. °· The patient is a child older than 3 months, and he or she has a fever and persistent symptoms. °· The patient is a child older than 3 months, and he or she has a fever and symptoms suddenly get worse. °· The patient is a baby, and he or she has no tears when crying. °MAKE SURE YOU:  °· Understand these instructions. °· Will watch your condition. °· Will get help right away if you are not doing well or get worse. °Document Released: 05/10/2005 Document Revised: 08/02/2011 Document Reviewed: 02/24/2011 °  [employer] Patient Information 2015 [place], [place]. This information is not intended to replace advice given to you by your health care provider. Make sure you discuss any questions you have with your health care provider.  Lumbosacral Strain Lumbosacral strain is a strain of any of the parts that make up your lumbosacral vertebrae.  Your lumbosacral vertebrae are the bones that make up the lower third of your backbone. Your lumbosacral vertebrae are held together by muscles and tough, fibrous tissue (ligaments).  CAUSES  A sudden blow to your back can cause lumbosacral strain. Also, anything that causes an excessive stretch of the muscles in the low back can cause this strain. This is typically seen when people exert themselves strenuously, fall, lift heavy objects, bend, or crouch repeatedly. RISK FACTORS  Physically demanding work.  Participation in pushing or pulling sports or sports that require a sudden twist of the back (tennis, golf, baseball).  Weight lifting.  Excessive lower back curvature.  Forward-tilted pelvis.  Weak back or abdominal muscles or both.  Tight hamstrings. SIGNS AND SYMPTOMS  Lumbosacral strain may cause pain in the area of your injury or pain that moves (radiates) down your leg.  DIAGNOSIS Your health care provider can often diagnose lumbosacral strain through a physical exam. In some cases, you may need tests such as X-ray exams.  TREATMENT  Treatment for your lower back injury depends on many factors that your clinician will have to evaluate. However, most treatment will include the use of anti-inflammatory medicines. HOME CARE INSTRUCTIONS   Avoid hard physical activities (tennis, racquetball, waterskiing) if you are not in proper physical condition for it. This may aggravate or create problems.  If you have a back problem, avoid sports requiring sudden body movements. Swimming and walking are generally safer activities.  Maintain good posture.  Maintain a healthy weight.  For acute conditions, you may put ice on the injured area.  Put ice in a plastic bag.  Place a towel between your skin and the bag.  Leave the ice on for 20 minutes, 2-3 times a day.  When the low back starts healing, stretching and strengthening exercises may be recommended. SEEK MEDICAL CARE  IF:  Your back pain is getting worse.  You experience severe back pain not relieved with medicines. SEEK IMMEDIATE MEDICAL CARE IF:   You have numbness, tingling, weakness, or problems with the use of your arms or legs.  There is a change in bowel or bladder control.  You have increasing pain in any area of the body, including your belly (abdomen).  You notice shortness of breath, dizziness, or feel faint.  You feel sick to your stomach (nauseous), are throwing up (vomiting), or become sweaty.  You notice discoloration of your toes or legs, or your feet get very cold. MAKE SURE YOU:   Understand these instructions.  Will watch your condition.  Will get help right away if you are not doing well or get worse. Document Released: 02/17/2005 Document Revised: 05/15/2013 Document Reviewed: 12/27/2012 [facility] Patient Information 2015 [place], [place]. This information is not intended to replace advice given to you by your health care provider. Make sure you discuss any questions you have with your health care provider.   Back Exercises Back exercises help treat and prevent back injuries. The goal of back exercises is to increase the strength of your abdominal and back muscles and the flexibility of your back. These exercises should be started when you no longer have back pain. Back exercises include:  Pelvic Tilt. Lie on your back with your knees bent. Tilt your pelvis until the lower part of your back is against the floor. Hold this position 5 to 10 sec and repeat 5 to 10 times.  Knee to Chest. Pull first 1 knee up against your chest and hold for 20 to 30 seconds, repeat this with the other knee, and then both knees. This may be done with the other leg straight or bent, whichever feels better.  Sit-Ups or Curl-Ups. Bend your knees 90 degrees. Start with tilting your pelvis, and do a partial, slow sit-up, lifting your trunk only 30 to 45 degrees off the floor. Take at least 2 to 3  seconds for each sit-up. Do not do sit-ups with your knees out straight. If partial sit-ups are difficult, simply do the above but with only tightening your abdominal muscles and holding it as directed.  Hip-Lift. Lie on your back with your knees flexed 90 degrees. Push down with your feet and shoulders as you raise your hips a couple inches off the floor; hold for 10 seconds, repeat 5 to 10 times.  Back arches. Lie on your stomach, propping yourself up on bent elbows. Slowly press on your hands, causing an arch in your low back. Repeat 3 to 5 times. Any initial stiffness and discomfort should lessen with repetition over time.  Shoulder-Lifts. Lie face down with arms beside your body. Keep hips and torso pressed to floor as you slowly lift your head and shoulders off the floor. Do not overdo your exercises, especially in the beginning. Exercises may cause you some mild back discomfort which lasts for a few minutes; however, if the pain is more severe, or lasts for more than 15 minutes, do not continue exercises until you see your caregiver. Improvement with exercise therapy for back problems is slow.  See your caregivers for assistance with developing a proper back exercise program. Document Released: 06/17/2004 Document Revised: 08/02/2011 Document Reviewed: 03/11/2011 [name] Patient Information 2015 [place] [employer]. This information is not intended to replace advice given to you by your health care provider. Make sure you discuss any questions you have with your health care provider.

## 2014-10-12 NOTE — ED Notes (Signed)
Pt given Ginger Ale for PO challenge. Denies n/v.

## 2014-10-12 NOTE — ED Provider Notes (Signed)
CSN: [id number]     Arrival date & time 10/12/14  1326 History   First MD Initiated Contact with Patient 10/12/14 1411     Chief Complaint  Patient presents with  . Hip Pain  . Back Pain  . Nausea     (Consider location/radiation/quality/duration/timing/severity/associated sxs/prior Treatment) HPI Catherine Koch is a 39 year old female with past medical history of ovarian failure, anxiety, GERD who presents the ER with multiple complaints. Patient reports since Monday, 6 days ago she has been experiencing persistent nausea, anorexia, "stomach bloating", reflux after eating. Patient reports associated fatigue, myalgias. Patient states the past 3 days she has been experiencing low back pain which is consistent with back pain she has experienced in the past. She reports bilateral radiation of this back pain into her hips. Patient denies severe abdominal pain, chest pain, shortness of breath, persistent fever, numbness, weakness, saddle anesthesia, bowel/bladder incontinence/retention.  Past Medical History  Diagnosis Date  . Depression   . Ovarian failure   . Anxiety   . UTI (lower urinary tract infection)   . Sleep apnea     cpap  . Shortness of breath     exertion from crutches  . GERD (gastroesophageal reflux disease)   . Closed fracture of right distal fibula 11/19/2013   Past Surgical History  Procedure Laterality Date  . Cholecystectomy  2010  . Cholecystectomy    . Wisdom tooth extraction    . Orif fibula fracture Right 11/19/2013    Procedure: OPEN REDUCTION INTERNAL FIXATION (ORIF) RIGHT FIBULA FRACTURE;  Surgeon: Eulas Post, MD;  Location: [facility] OR;  Service: Orthopedics;  Laterality: Right;   Family History  Problem Relation Age of Onset  . Allergies Sister   . Heart disease Paternal Grandmother   . Cancer Paternal Grandmother     BREAST CANCER  . Cancer Paternal Grandfather    History  Substance Use Topics  . Smoking status: Former Smoker    Types: Cigarettes   . Smokeless tobacco: Never Used  . Alcohol Use: Yes     Comment: occasionally   OB History    No data available     Review of Systems  Constitutional: Positive for appetite change and fatigue. Negative for fever.  HENT: Negative for trouble swallowing.   Eyes: Negative for visual disturbance.  Respiratory: Negative for shortness of breath.   Cardiovascular: Negative for chest pain.  Gastrointestinal: Positive for nausea. Negative for vomiting, abdominal pain, diarrhea, constipation and blood in stool.  Genitourinary: Negative for dysuria.  Musculoskeletal: Positive for myalgias and back pain. Negative for neck pain.  Skin: Negative for rash.  Neurological: Positive for light-headedness. Negative for dizziness, weakness and numbness.  Psychiatric/Behavioral: Negative.       Allergies  Sulfa antibiotics  Home Medications   Prior to Admission medications   Medication Sig Start Date End Date Taking? Authorizing Provider  acetaminophen (TYLENOL) 500 MG tablet Take 1,000 mg by mouth every 6 (six) hours as needed for mild pain.   Yes Historical Provider, MD  buPROPion (WELLBUTRIN XL) 150 MG 24 hr tablet Take 150 mg by mouth daily.   Yes Historical Provider, MD  cetirizine (ZYRTEC) 10 MG tablet Take 10 mg by mouth at bedtime.    Yes Historical Provider, MD  fluticasone (FLONASE) 50 MCG/ACT nasal spray Place 2 sprays into both nostrils daily.   Yes Historical Provider, MD  hydrOXYzine (ATARAX/VISTARIL) 25 MG tablet Take 25 mg by mouth 3 (three) times daily as needed for anxiety.  Yes Historical Provider, MD  sertraline (ZOLOFT) 100 MG tablet Take 100 mg by mouth daily.   Yes Historical Provider, MD  ibuprofen (ADVIL,MOTRIN) 800 MG tablet Take 1 tablet (800 mg total) by mouth 3 (three) times daily. 10/12/14   Ladona MowJoe Tashia Leiterman, PA-C  methocarbamol (ROBAXIN) 500 MG tablet Take 1 tablet (500 mg total) by mouth 2 (two) times daily. 10/12/14   Ladona MowJoe Virlan Kempker, PA-C  ondansetron (ZOFRAN) 4 MG tablet Take  1 tablet (4 mg total) by mouth every 8 (eight) hours as needed for nausea or vomiting. 10/12/14   Ladona MowJoe Wade Asebedo, PA-C  pseudoephedrine (SUDAFED 12 HOUR) 120 MG 12 hr tablet Take 1 tablet (120 mg total) by mouth 2 (two) times daily. Patient not taking: Reported on 10/12/2014 08/11/14   Earley FavorGail Schulz, NP   BP 121/75 mmHg  Pulse 59  Temp(Src) 98.3 F (36.8 C) (Oral)  Resp 18  SpO2 100% Physical Exam  Constitutional: She is oriented to person, place, and time. She appears well-developed and well-nourished. No distress.  HENT:  Head: Normocephalic and atraumatic.  Mouth/Throat: Oropharynx is clear and moist. No oropharyngeal exudate.  Eyes: Right eye exhibits no discharge. Left eye exhibits no discharge. No scleral icterus.  Neck: Normal range of motion.  Cardiovascular: Normal rate, regular rhythm and normal heart sounds.   No murmur heard. Pulmonary/Chest: Effort normal and breath sounds normal. No respiratory distress.  Abdominal: Soft. There is tenderness in the epigastric area. There is no rigidity, no guarding, no tenderness at McBurney's point and negative Murphy's sign.  Mild, epigastric tenderness.  Musculoskeletal: Normal range of motion. She exhibits no edema or tenderness.  Neurological: She is alert and oriented to person, place, and time. No cranial nerve deficit. Coordination normal.  Skin: Skin is warm and dry. No rash noted. She is not diaphoretic.  Psychiatric: She has a normal mood and affect.  Nursing note and vitals reviewed.   ED Course  Procedures (including critical care time) Labs Review Labs Reviewed  COMPREHENSIVE METABOLIC PANEL - Abnormal; Notable for the following:    Glucose, Bld 105 (*)    All other components within normal limits  CBC WITH DIFFERENTIAL/PLATELET  LIPASE, BLOOD  URINALYSIS, ROUTINE W REFLEX MICROSCOPIC  POC URINE PREG, ED    Imaging Review No results found.   EKG Interpretation None      MDM   Final diagnoses:  Nausea  Midline  low back pain without sciatica  Viral syndrome    Patient here with constellation of symptoms, most likely viral in nature. Mild nausea without vomiting controlled with Zofran and fluids here. Patient rehydrated. Abdominal exam is benign is no concern for acute abdomen. Patient with back pain.  No neurological deficits and normal neuro exam.  Patient can walk but states is painful.  No loss of bowel or bladder control.  No concern for cauda equina.  No fever, night sweats, weight loss, h/o cancer, IVDU.  RICE protocol and pain medicine indicated and discussed with patient. Patient's lab work unremarkable for acute pathology. Do not believe imaging is warranted at this time. Patient is well-appearing, hemodynamically stable, afebrile and in no acute distress. Symptomatic therapy has improved s/s, patient tolerating PO well. Patient stable for discharge. I encouraged follow-up with primary care provider, discussed return precautions with patient, patient verbalizes understanding and agreement of this plan.  BP 121/75 mmHg  Pulse 59  Temp(Src) 98.3 F (36.8 C) (Oral)  Resp 18  SpO2 100%  Signed,  Ladona MowJoe Labrea Eccleston, PA-C 4:40 PM  Ladona Mow, PA-C 10/12/14 1640  Mancel Bale, MD 10/15/14 1540

## 2014-10-12 NOTE — ED Notes (Signed)
Pt has multiple complaints of bilateral hip pain and lower back pain x3 days. Hx sciatica. Had "stomach flu" earlier this week and thinks this may also be "after effects" of this. C/o general fatigue and achiness. C/o nausea, denies vomiting. Feels bloated after eating, and has been having a lot of heartburn.

## 2014-12-02 ENCOUNTER — Encounter (HOSPITAL_COMMUNITY)
Admission: RE | Admit: 2014-12-02 | Discharge: 2014-12-02 | Disposition: A | Discharge status: Home / Self Care | Payer: BLUE CROSS/BLUE SHIELD | Source: Ambulatory Visit | Attending: Otolaryngology | Admitting: Otolaryngology

## 2014-12-02 ENCOUNTER — Other Ambulatory Visit: Payer: Self-pay | Admitting: Otolaryngology

## 2014-12-02 ENCOUNTER — Encounter (HOSPITAL_COMMUNITY): Payer: Self-pay

## 2014-12-02 DIAGNOSIS — Z01812 Encounter for preprocedural laboratory examination: Secondary | ICD-10-CM | POA: Insufficient documentation

## 2014-12-02 HISTORY — DX: Personal history of other diseases of the respiratory system: Z87.09

## 2014-12-02 HISTORY — DX: Chronic sinusitis, unspecified: J32.9

## 2014-12-02 HISTORY — DX: Adverse effect of unspecified anesthetic, initial encounter: T41.45XA

## 2014-12-02 HISTORY — DX: Headache, unspecified: R51.9

## 2014-12-02 HISTORY — DX: Presence of spectacles and contact lenses: Z97.3

## 2014-12-02 HISTORY — DX: Pure hypercholesterolemia, unspecified: E78.00

## 2014-12-02 HISTORY — DX: Unspecified osteoarthritis, unspecified site: M19.90

## 2014-12-02 HISTORY — DX: Headache: R51

## 2014-12-02 HISTORY — DX: Other complications of anesthesia, initial encounter: T88.59XA

## 2014-12-02 LAB — BASIC METABOLIC PANEL
Anion gap: 7 (ref 5–15)
BUN: 10 mg/dL (ref 6–20)
CHLORIDE: 106 mmol/L (ref 101–111)
CO2: 25 mmol/L (ref 22–32)
CREATININE: 0.81 mg/dL (ref 0.44–1.00)
Calcium: 9.5 mg/dL (ref 8.9–10.3)
Glucose, Bld: 88 mg/dL (ref 65–99)
Potassium: 4 mmol/L (ref 3.5–5.1)
Sodium: 138 mmol/L (ref 135–145)

## 2014-12-02 LAB — CBC
HEMATOCRIT: 38 % (ref 36.0–46.0)
HEMOGLOBIN: 12.8 g/dL (ref 12.0–15.0)
MCH: 29.8 pg (ref 26.0–34.0)
MCHC: 33.7 g/dL (ref 30.0–36.0)
MCV: 88.4 fL (ref 78.0–100.0)
Platelets: 309 10*3/uL (ref 150–400)
RBC: 4.3 MIL/uL (ref 3.87–5.11)
RDW: 14.1 % (ref 11.5–15.5)
WBC: 7.6 10*3/uL (ref 4.0–10.5)

## 2014-12-02 LAB — HCG, SERUM, QUALITATIVE: Preg, Serum: NEGATIVE

## 2014-12-02 NOTE — Progress Notes (Signed)
PCP- Dr. Virl Sonammy Boyd  Cardiologist - Denies EKG- Denies CXR - denies Echo/stress test/cardiac cath - denies  Pt. Positive for sleep apnea but does not know settings.  Sleep study completed 02/02/12 - in Epic.

## 2014-12-02 NOTE — Pre-Procedure Instructions (Signed)
    Reata Posillico  12/02/2014      [facility] - [place] - [address] [place] [address] Phone: [phone] Fax: [phone]  [facility] - [place] - [address] [address] [address] [place] [address] Phone: [phone] Fax: [phone]    Your procedure is scheduled on Wednesday, July 20th, 2016 at 8:30 AM.  [facility] at 6:30 A.M.  Call this number if you have problems the morning of surgery:  [phone]   Remember:  Do not eat food or drink liquids after midnight.   Take these medicines the morning of surgery with A SIP OF WATER Acetaminophen (Tylenol) if needed, Buproprion (Wellbutrin), Cetirizine (Zyrtec), Sertraline (Zoloft)  Stop taking the following 7 days prior to surgery (July 13th): NSAIDs, Aspirin, Aleve, Naproxen, Ibuprofen, BC's, Goody's, Fish Oil, all herbal medications, and all vitamins.     Do not wear jewelry, make-up or nail polish.  Do not wear lotions, powders, or perfumes.  You may wear deodorant.  Do not shave 48 hours prior to surgery.    Do not bring valuables to the hospital.  [facility] is not [facility] any belongings or valuables.  Contacts, dentures or bridgework may not be worn into surgery.  Leave your suitcase in the car.  After surgery it may be brought to your room.  For patients admitted to the hospital, discharge time will be determined by your treatment team.  Patients discharged the day of surgery will not be allowed to drive home.   Special instructions:  See attached.   Please read over the following fact sheets that you were given. Pain Booklet, Coughing and Deep Breathing and Surgical Site Infection Prevention

## 2014-12-10 MED ORDER — DEXTROSE 5 % IV SOLN
3.0000 g | INTRAVENOUS | Status: AC
  Administered 2014-12-11: 3 g via INTRAVENOUS
  Filled 2014-12-10: qty 3000

## 2014-12-10 MED ORDER — DEXAMETHASONE SODIUM PHOSPHATE 10 MG/ML IJ SOLN
10.0000 mg | INTRAMUSCULAR | Status: AC
  Administered 2014-12-11: 10 mg via INTRAVENOUS
  Filled 2014-12-10: qty 1

## 2014-12-10 NOTE — Progress Notes (Signed)
Left message for patient to arrive at 08:30

## 2014-12-11 ENCOUNTER — Ambulatory Visit (HOSPITAL_COMMUNITY): Payer: BLUE CROSS/BLUE SHIELD | Admitting: Anesthesiology

## 2014-12-11 ENCOUNTER — Encounter (HOSPITAL_COMMUNITY)
Admission: RE | Disposition: A | Discharge status: Home / Self Care | Payer: Self-pay | Source: Ambulatory Visit | Attending: Otolaryngology

## 2014-12-11 ENCOUNTER — Encounter (HOSPITAL_COMMUNITY): Payer: Self-pay | Admitting: *Deleted

## 2014-12-11 ENCOUNTER — Ambulatory Visit (HOSPITAL_COMMUNITY)
Admission: RE | Admit: 2014-12-11 | Discharge: 2014-12-11 | Disposition: A | Discharge status: Home / Self Care | Payer: BLUE CROSS/BLUE SHIELD | Source: Ambulatory Visit | Attending: Otolaryngology | Admitting: Otolaryngology

## 2014-12-11 DIAGNOSIS — Z87891 Personal history of nicotine dependence: Secondary | ICD-10-CM | POA: Diagnosis not present

## 2014-12-11 DIAGNOSIS — G4730 Sleep apnea, unspecified: Secondary | ICD-10-CM | POA: Diagnosis not present

## 2014-12-11 DIAGNOSIS — F419 Anxiety disorder, unspecified: Secondary | ICD-10-CM | POA: Insufficient documentation

## 2014-12-11 DIAGNOSIS — Z87440 Personal history of urinary (tract) infections: Secondary | ICD-10-CM | POA: Insufficient documentation

## 2014-12-11 DIAGNOSIS — F329 Major depressive disorder, single episode, unspecified: Secondary | ICD-10-CM | POA: Diagnosis not present

## 2014-12-11 DIAGNOSIS — Z882 Allergy status to sulfonamides status: Secondary | ICD-10-CM | POA: Diagnosis not present

## 2014-12-11 DIAGNOSIS — J342 Deviated nasal septum: Secondary | ICD-10-CM | POA: Diagnosis present

## 2014-12-11 DIAGNOSIS — Z6841 Body Mass Index (BMI) 40.0 and over, adult: Secondary | ICD-10-CM | POA: Insufficient documentation

## 2014-12-11 DIAGNOSIS — K219 Gastro-esophageal reflux disease without esophagitis: Secondary | ICD-10-CM | POA: Diagnosis not present

## 2014-12-11 DIAGNOSIS — M170 Bilateral primary osteoarthritis of knee: Secondary | ICD-10-CM | POA: Diagnosis not present

## 2014-12-11 DIAGNOSIS — G43909 Migraine, unspecified, not intractable, without status migrainosus: Secondary | ICD-10-CM | POA: Insufficient documentation

## 2014-12-11 HISTORY — PX: NASAL SEPTOPLASTY W/ TURBINOPLASTY: SHX2070

## 2014-12-11 SURGERY — SEPTOPLASTY, NOSE, WITH NASAL TURBINATE REDUCTION
Anesthesia: General | Site: Nose | Laterality: Bilateral

## 2014-12-11 MED ORDER — LIDOCAINE HCL (CARDIAC) 20 MG/ML IV SOLN
INTRAVENOUS | Status: DC | PRN
  Administered 2014-12-11: 75 mg via INTRAVENOUS

## 2014-12-11 MED ORDER — PHENOL 1.4 % MT LIQD
1.0000 | OROMUCOSAL | Status: DC | PRN
  Administered 2014-12-11: 1 via OROMUCOSAL
  Filled 2014-12-11: qty 177

## 2014-12-11 MED ORDER — HYDROMORPHONE HCL 1 MG/ML IJ SOLN
INTRAMUSCULAR | Status: AC
  Filled 2014-12-11: qty 1

## 2014-12-11 MED ORDER — OXYMETAZOLINE HCL 0.05 % NA SOLN
NASAL | Status: AC
  Filled 2014-12-11: qty 15

## 2014-12-11 MED ORDER — ROCURONIUM BROMIDE 100 MG/10ML IV SOLN
INTRAVENOUS | Status: DC | PRN
  Administered 2014-12-11: 50 mg via INTRAVENOUS

## 2014-12-11 MED ORDER — HYDROCODONE-ACETAMINOPHEN 5-325 MG PO TABS: 1.0000 | ORAL_TABLET | Freq: Four times a day (QID) | ORAL | Status: DC | PRN

## 2014-12-11 MED ORDER — AMOXICILLIN-POT CLAVULANATE 500-125 MG PO TABS: 1.0000 | ORAL_TABLET | Freq: Two times a day (BID) | ORAL | Status: DC

## 2014-12-11 MED ORDER — MUPIROCIN CALCIUM 2 % EX CREA
TOPICAL_CREAM | CUTANEOUS | Status: DC | PRN
  Administered 2014-12-11: 1 via TOPICAL

## 2014-12-11 MED ORDER — ROCURONIUM BROMIDE 50 MG/5ML IV SOLN
INTRAVENOUS | Status: AC
  Filled 2014-12-11: qty 1

## 2014-12-11 MED ORDER — MUPIROCIN CALCIUM 2 % EX CREA
TOPICAL_CREAM | CUTANEOUS | Status: AC
  Filled 2014-12-11: qty 15

## 2014-12-11 MED ORDER — LACTATED RINGERS IV SOLN
INTRAVENOUS | Status: DC
  Administered 2014-12-11: 10:00:00 via INTRAVENOUS

## 2014-12-11 MED ORDER — PROPOFOL 10 MG/ML IV BOLUS
INTRAVENOUS | Status: DC | PRN
  Administered 2014-12-11: 190 mg via INTRAVENOUS

## 2014-12-11 MED ORDER — LIDOCAINE-EPINEPHRINE 1 %-1:100000 IJ SOLN
INTRAMUSCULAR | Status: DC | PRN
  Administered 2014-12-11: 8 mL

## 2014-12-11 MED ORDER — MIDAZOLAM HCL 5 MG/5ML IJ SOLN
INTRAMUSCULAR | Status: DC | PRN
  Administered 2014-12-11: 1 mg via INTRAVENOUS

## 2014-12-11 MED ORDER — FENTANYL CITRATE (PF) 250 MCG/5ML IJ SOLN
INTRAMUSCULAR | Status: AC
  Filled 2014-12-11: qty 5

## 2014-12-11 MED ORDER — FENTANYL CITRATE (PF) 100 MCG/2ML IJ SOLN
INTRAMUSCULAR | Status: DC | PRN
  Administered 2014-12-11: 50 ug via INTRAVENOUS

## 2014-12-11 MED ORDER — PROPOFOL 10 MG/ML IV BOLUS
INTRAVENOUS | Status: AC
  Filled 2014-12-11: qty 20

## 2014-12-11 MED ORDER — PROMETHAZINE HCL 25 MG/ML IJ SOLN: 6.2500 mg | INTRAMUSCULAR | Status: DC | PRN

## 2014-12-11 MED ORDER — ARTIFICIAL TEARS OP OINT
TOPICAL_OINTMENT | OPHTHALMIC | Status: DC | PRN
  Administered 2014-12-11: 1 via OPHTHALMIC

## 2014-12-11 MED ORDER — PHENYLEPHRINE 40 MCG/ML (10ML) SYRINGE FOR IV PUSH (FOR BLOOD PRESSURE SUPPORT)
PREFILLED_SYRINGE | INTRAVENOUS | Status: AC
  Filled 2014-12-11: qty 10

## 2014-12-11 MED ORDER — ONDANSETRON HCL 4 MG/2ML IJ SOLN
INTRAMUSCULAR | Status: AC
  Filled 2014-12-11: qty 2

## 2014-12-11 MED ORDER — PHENYLEPHRINE HCL 10 MG/ML IJ SOLN
INTRAMUSCULAR | Status: DC | PRN
  Administered 2014-12-11: 40 ug via INTRAVENOUS
  Administered 2014-12-11: 80 ug via INTRAVENOUS
  Administered 2014-12-11: 40 ug via INTRAVENOUS

## 2014-12-11 MED ORDER — NEOSTIGMINE METHYLSULFATE 10 MG/10ML IV SOLN
INTRAVENOUS | Status: DC | PRN
  Administered 2014-12-11: 4 mg via INTRAVENOUS

## 2014-12-11 MED ORDER — HYDROMORPHONE HCL 1 MG/ML IJ SOLN
0.2500 mg | INTRAMUSCULAR | Status: DC | PRN
  Administered 2014-12-11 (×2): 0.25 mg via INTRAVENOUS

## 2014-12-11 MED ORDER — LIDOCAINE HCL (CARDIAC) 20 MG/ML IV SOLN
INTRAVENOUS | Status: AC
  Filled 2014-12-11: qty 5

## 2014-12-11 MED ORDER — GLYCOPYRROLATE 0.2 MG/ML IJ SOLN
INTRAMUSCULAR | Status: DC | PRN
  Administered 2014-12-11: 0.6 mg via INTRAVENOUS

## 2014-12-11 MED ORDER — LIDOCAINE-EPINEPHRINE 1 %-1:100000 IJ SOLN
INTRAMUSCULAR | Status: AC
  Filled 2014-12-11: qty 1

## 2014-12-11 MED ORDER — MIDAZOLAM HCL 2 MG/2ML IJ SOLN
INTRAMUSCULAR | Status: AC
  Filled 2014-12-11: qty 2

## 2014-12-11 MED ORDER — DEXTROSE 5 % IV SOLN
INTRAVENOUS | Status: DC | PRN
  Administered 2014-12-11: 10:00:00 via INTRAVENOUS

## 2014-12-11 MED ORDER — OXYMETAZOLINE HCL 0.05 % NA SOLN
NASAL | Status: DC | PRN
  Administered 2014-12-11: 2 via TOPICAL

## 2014-12-11 MED ORDER — 0.9 % SODIUM CHLORIDE (POUR BTL) OPTIME
TOPICAL | Status: DC | PRN
  Administered 2014-12-11: 1000 mL

## 2014-12-11 MED ORDER — GLYCOPYRROLATE 0.2 MG/ML IJ SOLN
INTRAMUSCULAR | Status: AC
  Filled 2014-12-11: qty 3

## 2014-12-11 MED ORDER — ARTIFICIAL TEARS OP OINT
TOPICAL_OINTMENT | OPHTHALMIC | Status: AC
  Filled 2014-12-11: qty 3.5

## 2014-12-11 SURGICAL SUPPLY — 25 items
BLADE SURG 15 STRL LF DISP TIS (BLADE) IMPLANT
BLADE SURG 15 STRL SS (BLADE)
CANISTER SUCTION 2500CC (MISCELLANEOUS) ×3 IMPLANT
COAGULATOR SUCT 8FR VV (MISCELLANEOUS) ×3 IMPLANT
DRAPE PROXIMA HALF (DRAPES) ×3 IMPLANT
ELECT REM PT RETURN 9FT ADLT (ELECTROSURGICAL)
ELECTRODE REM PT RTRN 9FT ADLT (ELECTROSURGICAL) IMPLANT
GAUZE SPONGE 2X2 8PLY STRL LF (GAUZE/BANDAGES/DRESSINGS) IMPLANT
GLOVE BIOGEL M 7.0 STRL (GLOVE) ×6 IMPLANT
GOWN STRL REUS W/ TWL LRG LVL3 (GOWN DISPOSABLE) ×2 IMPLANT
GOWN STRL REUS W/TWL LRG LVL3 (GOWN DISPOSABLE) ×4
KIT BASIN OR (CUSTOM PROCEDURE TRAY) ×3 IMPLANT
KIT ROOM TURNOVER OR (KITS) ×3 IMPLANT
NEEDLE HYPO 25GX1X1/2 BEV (NEEDLE) ×3 IMPLANT
NS IRRIG 1000ML POUR BTL (IV SOLUTION) ×3 IMPLANT
PAD ARMBOARD 7.5X6 YLW CONV (MISCELLANEOUS) ×3 IMPLANT
SPLINT NASAL DOYLE BI-VL (GAUZE/BANDAGES/DRESSINGS) ×3 IMPLANT
SPONGE GAUZE 2X2 STER 10/PKG (GAUZE/BANDAGES/DRESSINGS)
SPONGE NEURO XRAY DETECT 1X3 (DISPOSABLE) ×3 IMPLANT
SUT ETHILON 3 0 PS 1 (SUTURE) ×3 IMPLANT
SUT PLAIN 4 0 ~~LOC~~ 1 (SUTURE) ×3 IMPLANT
TOWEL OR 17X24 6PK STRL BLUE (TOWEL DISPOSABLE) IMPLANT
TRAY ENT MC OR (CUSTOM PROCEDURE TRAY) ×3 IMPLANT
TUBE SALEM SUMP 16 FR W/ARV (TUBING) ×3 IMPLANT
TUBING EXTENTION W/L.L. (IV SETS) ×3 IMPLANT

## 2014-12-11 NOTE — Transfer of Care (Signed)
Immediate Anesthesia Transfer of Care Note  Patient: Catherine Koch  Procedure(s) Performed: Procedure(s): NASAL SEPTOPLASTY AND BILATERAL INFERIOR TURBINATE REDUCTION (Bilateral)  Patient Location: PACU  Anesthesia Type:General  Level of Consciousness: awake, alert , oriented and patient cooperative  Airway & Oxygen Therapy: Patient Spontanous Breathing and Patient connected to face mask oxygen  Post-op Assessment: Report given to RN, Post -op Vital signs reviewed and stable and Patient moving all extremities  Post vital signs: Reviewed and stable  Last Vitals:  Filed Vitals:   12/11/14 1115  BP: 113/82  Pulse: 65  Temp: 36.2 C  Resp: 14    Complications: No apparent anesthesia complications

## 2014-12-11 NOTE — H&P (Signed)
Catherine FarmerJanine L Koch is an 39 y.o. female.   Chief Complaint: nasal airway obstruction HPI: Prog Deviated septum with turbinate hypertrophy and nasal airway obstruction  Past Medical History  Diagnosis Date  . Depression   . Ovarian failure   . Anxiety   . UTI (lower urinary tract infection)   . Sleep apnea     cpap  . Shortness of breath     exertion from crutches  . GERD (gastroesophageal reflux disease)   . Closed fracture of right distal fibula 11/19/2013  . Chronic sinus infection   . Complication of anesthesia     pt. states difficult to wake up  . History of bronchitis   . Headache     pt. states random migraines  . Wears glasses   . Arthritis     pt. states in knees  . High cholesterol     Past Surgical History  Procedure Laterality Date  . Cholecystectomy  2010  . Cholecystectomy    . Wisdom tooth extraction    . Orif fibula fracture Right 11/19/2013    Procedure: OPEN REDUCTION INTERNAL FIXATION (ORIF) RIGHT FIBULA FRACTURE;  Surgeon: Eulas PostJoshua P Landau, MD;  Location: [facility] OR;  Service: Orthopedics;  Laterality: Right;  . Esophagogastroduodenoscopy      Family History  Problem Relation Age of Onset  . Allergies Sister   . Heart disease Paternal Grandmother   . Cancer Paternal Grandmother     BREAST CANCER  . Cancer Paternal Grandfather    Social History:  reports that she quit smoking about 4 years ago. Her smoking use included Cigarettes. She has never used smokeless tobacco. She reports that she drinks alcohol. She reports that she does not use illicit drugs.  Allergies:  Allergies  Allergen Reactions  . Sulfa Antibiotics Anaphylaxis, Swelling and Rash    Throat swelling/ rashes    Medications Prior to Admission  Medication Sig Dispense Refill  . acetaminophen (TYLENOL) 500 MG tablet Take 1,000 mg by mouth every 6 (six) hours as needed for mild pain.    Marland Kitchen. buPROPion (WELLBUTRIN XL) 150 MG 24 hr tablet Take 150 mg by mouth daily.    Marland Kitchen. CALCIUM PO Take 2  tablets by mouth daily.    . cetirizine (ZYRTEC) 10 MG tablet Take 10 mg by mouth at bedtime.     . sertraline (ZOLOFT) 100 MG tablet Take 100 mg by mouth daily.    Marland Kitchen. ibuprofen (ADVIL,MOTRIN) 800 MG tablet Take 1 tablet (800 mg total) by mouth 3 (three) times daily. 21 tablet 0  . methocarbamol (ROBAXIN) 500 MG tablet Take 1 tablet (500 mg total) by mouth 2 (two) times daily. (Patient not taking: Reported on 11/29/2014) 20 tablet 0  . ondansetron (ZOFRAN) 4 MG tablet Take 1 tablet (4 mg total) by mouth every 8 (eight) hours as needed for nausea or vomiting. (Patient not taking: Reported on 11/29/2014) 12 tablet 0  . pseudoephedrine (SUDAFED 12 HOUR) 120 MG 12 hr tablet Take 1 tablet (120 mg total) by mouth 2 (two) times daily. (Patient not taking: Reported on 10/12/2014) 30 tablet 0    No results found for this or any previous visit (from the past 48 hour(s)). No results found.  Review of Systems  Constitutional: Negative.   HENT: Positive for congestion.   Respiratory: Negative.   Cardiovascular: Negative.   Gastrointestinal: Negative.     Blood pressure 144/54, pulse 63, temperature 98.2 F (36.8 C), temperature source Oral, resp. rate 20, height 5\' 9"  (  1.753 m), weight 134.718 kg (297 lb), SpO2 98 %. Physical Exam  Constitutional: She is oriented to person, place, and time. She appears well-developed and well-nourished.  HENT:  Deviated septum and nasal airway obstruction  Neck: Normal range of motion. Neck supple.  Cardiovascular: Normal rate.   Respiratory: Effort normal.  GI: Soft.  Musculoskeletal: Normal range of motion.  Neurological: She is alert and oriented to person, place, and time.     Assessment/Plan Adm for OP septoplasty and IT reduction.  Liev Brockbank 12/11/2014, 9:35 AM

## 2014-12-11 NOTE — Anesthesia Postprocedure Evaluation (Signed)
  Anesthesia Post-op Note  Patient: Catherine Koch  Procedure(s) Performed: Procedure(s) (LRB): NASAL SEPTOPLASTY AND BILATERAL INFERIOR TURBINATE REDUCTION (Bilateral)  Patient Location: PACU  Anesthesia Type: General  Level of Consciousness: awake and alert   Airway and Oxygen Therapy: Patient Spontanous Breathing  Post-op Pain: mild  Post-op Assessment: Post-op Vital signs reviewed, Patient's Cardiovascular Status Stable, Respiratory Function Stable, Patent Airway and No signs of Nausea or vomiting  Last Vitals:  Filed Vitals:   12/11/14 1200  BP: 140/79  Pulse: 57  Temp:   Resp: 16    Post-op Vital Signs: stable   Complications: No apparent anesthesia complications

## 2014-12-11 NOTE — Op Note (Signed)
NAME:  Catherine Koch, Catherine Koch              ACCOUNT NO.:  [id number]  MEDICAL RECORD NO.:  [id number]  LOCATION:  [facility]                         FACILITY:  [facility]  PHYSICIAN:  Catherine Koch, Catherine KochDATE OF BIRTH:  01-28-1976  DATE OF PROCEDURE:  12/11/2014 DATE OF DISCHARGE:  12/11/2014                              OPERATIVE REPORT   ADDENDUM:  Patient name discrepancy.  POSILLICO change this to [facility]          ______________________________ Catherine Koch, M.D.     DLS/MEDQ  D:  16/10/960407/20/2016  T:  12/11/2014  Job:  [id number]

## 2014-12-11 NOTE — Anesthesia Preprocedure Evaluation (Addendum)
Anesthesia Evaluation  Patient identified by MRN, date of birth, ID band Patient awake    Reviewed: Allergy & Precautions, NPO status , Patient's Chart, lab work & pertinent test results  Airway Mallampati: II  TM Distance: >3 FB Neck ROM: Full    Dental no notable dental hx.    Pulmonary sleep apnea and Continuous Positive Airway Pressure Ventilation , former smoker,  breath sounds clear to auscultation  Pulmonary exam normal       Cardiovascular negative cardio ROS Normal cardiovascular examRhythm:Regular Rate:Normal     Neuro/Psych negative neurological ROS  negative psych ROS   GI/Hepatic negative GI ROS, Neg liver ROS,   Endo/Other  Morbid obesity  Renal/GU negative Renal ROS  negative genitourinary   Musculoskeletal negative musculoskeletal ROS (+)   Abdominal (+) + obese,   Peds negative pediatric ROS (+)  Hematology negative hematology ROS (+)   Anesthesia Other Findings   Reproductive/Obstetrics negative OB ROS                           Anesthesia Physical Anesthesia Plan  ASA: III  Anesthesia Plan: General   Post-op Pain Management:    Induction: Intravenous  Airway Management Planned: Oral ETT  Additional Equipment:   Intra-op Plan:   Post-operative Plan: Extubation in OR  Informed Consent: I have reviewed the patients History and Physical, chart, labs and discussed the procedure including the risks, benefits and alternatives for the proposed anesthesia with the patient or authorized representative who has indicated his/her understanding and acceptance.   Dental advisory given  Plan Discussed with: CRNA and Surgeon  Anesthesia Plan Comments:         Anesthesia Quick Evaluation

## 2014-12-11 NOTE — Op Note (Signed)
NAME:  Catherine Koch, Catherine Koch NO.:  [id number]  MEDICAL RECORD NO.:  [id number]  LOCATION:  [facility]                         FACILITY:  [facility]  PHYSICIAN:  Kinnie Scales. Annalee Genta, M.D.DATE OF BIRTH:  Nov 11, 1975  DATE OF PROCEDURE:  12/11/2014 DATE OF DISCHARGE:  12/11/2014                              OPERATIVE REPORT   PREOPERATIVE DIAGNOSES: 1. Deviated nasal septum with airway obstruction. 2. Bilateral inferior turbinate hypertrophy. 3. History of obstructive sleep apnea.  POSTOPERATIVE DIAGNOSES: 1. Deviated nasal septum with airway obstruction. 2. Bilateral inferior turbinate hypertrophy. 3. History of obstructive sleep apnea.  INDICATION FOR SURGERY: 1. Deviated nasal septum with airway obstruction. 2. Bilateral inferior turbinate hypertrophy. 3. History of obstructive sleep apnea.  PROCEDURE: 1. Nasal septoplasty. 2. Bilateral inferior turbinate reduction.  ANESTHESIA:  General endotracheal.  COMPLICATIONS:  None.  ESTIMATED BLOOD LOSS:  Less than 50 mL.  Catherine Koch transferred from Catherine operating room to Catherine recovery room in stable condition.  BRIEF HISTORY:  Catherine Koch is a 39 year old white female who was referred to our office for evaluation of progressive symptoms of nasal airway obstruction.  Catherine Koch is obese and has a history of moderately severe obstructive sleep apnea.  She wears CPAP on a nightly basis and is having increasing difficulty using her device because of nasal obstruction.  Examination in Catherine office showed a deviated nasal septum with greater than 75% airway obstruction and bilateral inferior turbinate hypertrophy.  Given her history and findings, I recommended that we consider her for septoplasty and inferior turbinate reduction. Catherine risks and benefits of procedure were discussed in detail with Catherine Koch and her husband and they understood and concurred with our plan for surgery which is scheduled on an elective basis at [facility] OR under general anesthesia.  DESCRIPTION OF PROCEDURE:  Catherine Koch was brought to Catherine operating room on December 11, 2014, and placed in supine position on Catherine operating table. General endotracheal anesthesia was established without difficulty. When Catherine Koch was adequately anesthetized, she was positioned on Catherine operating table and prepped and draped in a sterile fashion.  Her nose was injected with a total of 8 mL of 1% lidocaine with 1:100,000 dilution epinephrine injected in a submucosal fashion along Catherine nasal septum and inferior turbinates bilaterally.  Her nose was then packed with Afrin-soaked cottonoid pledgets and were left in place for approximately 10 minutes to allow for vasoconstriction and hemostasis. With Catherine Koch prepared for surgery, a surgical time-out was performed.  This included a correct identification of Catherine Koch and Catherine proposed surgical procedure.  With Catherine Koch ready, a left anterior hemitransfixion incision was created and Catherine mucoperichondrial flap was elevated along Catherine left nasal septum.  Catherine anterior cartilaginous septum was crossed at Catherine midline and Catherine mucoperichondrial flap was elevated on Catherine right.  Deviated bone and cartilage were then mobilized.  Anterior septal cartilage was removed and this was later morselized and returned to Catherine mucoperichondrial pocket.  Dissection was carried from anterior to posterior removing a deviated bone and cartilage including a large inferior septal spur which was removed preserving Catherine overlying mucosa. With Catherine septum brought to good midline position, Catherine  morselized cartilage was returned and Catherine Koch's incision was closed with interrupted 4-0 gut suture on a Keith needle in a horizontal mattress fashion.  Catherine anterior hemitransfixion incision was closed with Catherine same stitch, and Catherine mucosal flaps were reapproximated in a mattressing fashion.  At Catherine conclusion of Catherine surgical  procedure, bilateral Doyle nasal septal splints were placed after Catherine application of Bactroban ointment and sutured in position with a 3-0 Ethilon suture.  Turbinate reduction was then performed.  Using monopolar suction cautery, Catherine edematous mucosa of Catherine turbinate was cauterized and Catherine anterior incision was then created and overlying soft tissue was elevated.  A small amount of turbinate bone was resected, and Catherine turbinates themselves were out-fractured bilaterally to create a more patent nasal cavity.  Catherine Koch's nasal cavity was irrigated and suctioned and an orogastric tube was passed.  Stomach contents were aspirated.  Catherine Koch was awakened from her anesthetic.  She was then extubated and transferred from Catherine operating room to recovery room in stable condition.  There were no complications.  Blood loss was less than 50 mL.          ______________________________ Kinnie Scalesavid L. Annalee GentaShoemaker, M.D.     DLS/MEDQ  D:  16/10/960407/20/2016  T:  12/11/2014  Job:  [id number]

## 2014-12-11 NOTE — Anesthesia Procedure Notes (Signed)
Procedure Name: Intubation Date/Time: 12/11/2014 10:19 AM Performed by: Marni GriffonJAMES, Bindi Klomp B Pre-anesthesia Checklist: Patient identified, Emergency Drugs available, Suction available and Patient being monitored Patient Re-evaluated:Patient Re-evaluated prior to inductionOxygen Delivery Method: Circle system utilized Preoxygenation: Pre-oxygenation with 100% oxygen Intubation Type: IV induction Ventilation: Mask ventilation without difficulty Laryngoscope Size: Mac and 3 Grade View: Grade I Tube type: Oral Tube size: 7.5 mm Number of attempts: 1 Airway Equipment and Method: Stylet Placement Confirmation: positive ETCO2,  ETT inserted through vocal cords under direct vision and breath sounds checked- equal and bilateral Secured at: 21 (cm at teeth) cm Tube secured with: Tape Dental Injury: Teeth and Oropharynx as per pre-operative assessment

## 2014-12-11 NOTE — Brief Op Note (Signed)
12/11/2014  11:04 AM  PATIENT:  Catherine Koch  39 y.o. female  PRE-OPERATIVE DIAGNOSIS:  DEVIATED SEPTUM   POST-OPERATIVE DIAGNOSIS:  * No post-op diagnosis entered *  PROCEDURE:  Procedure(s): NASAL SEPTOPLASTY AND BILATERAL INFERIOR TURBINATE REDUCTION (Bilateral)  SURGEON:  Surgeon(s) and Role:    * Osborn Cohoavid Dea Bitting, MD - Primary  PHYSICIAN ASSISTANT:   ASSISTANTS: none   ANESTHESIA:   general  EBL:  Total I/O In: 50 [I.V.:50] Out: - EBL <50cc  BLOOD ADMINISTERED:none  DRAINS: none   LOCAL MEDICATIONS USED:  LIDOCAINE  and Amount: 8 ml  SPECIMEN:  No Specimen  DISPOSITION OF SPECIMEN:  N/A  COUNTS:  YES  TOURNIQUET:  * No tourniquets in log *  DICTATION: .Other Dictation: Dictation Number [id number]  PLAN OF CARE: Discharge to home after PACU  PATIENT DISPOSITION:  PACU - hemodynamically stable.   Delay start of Pharmacological VTE agent (>24hrs) due to surgical blood loss or risk of bleeding: not applicable

## 2014-12-12 ENCOUNTER — Encounter (HOSPITAL_COMMUNITY): Payer: Self-pay | Admitting: Otolaryngology

## 2015-01-06 ENCOUNTER — Emergency Department (HOSPITAL_COMMUNITY): Payer: BLUE CROSS/BLUE SHIELD

## 2015-01-06 ENCOUNTER — Emergency Department (HOSPITAL_COMMUNITY)
Admission: EM | Admit: 2015-01-06 | Discharge: 2015-01-07 | Disposition: A | Discharge status: Home / Self Care | Payer: BLUE CROSS/BLUE SHIELD | Attending: Emergency Medicine | Admitting: Emergency Medicine

## 2015-01-06 ENCOUNTER — Ambulatory Visit: Payer: BLUE CROSS/BLUE SHIELD

## 2015-01-06 ENCOUNTER — Encounter (HOSPITAL_COMMUNITY): Payer: Self-pay | Admitting: Emergency Medicine

## 2015-01-06 DIAGNOSIS — Z87891 Personal history of nicotine dependence: Secondary | ICD-10-CM | POA: Diagnosis not present

## 2015-01-06 DIAGNOSIS — Z8719 Personal history of other diseases of the digestive system: Secondary | ICD-10-CM | POA: Diagnosis not present

## 2015-01-06 DIAGNOSIS — Z792 Long term (current) use of antibiotics: Secondary | ICD-10-CM | POA: Diagnosis not present

## 2015-01-06 DIAGNOSIS — J0191 Acute recurrent sinusitis, unspecified: Secondary | ICD-10-CM | POA: Diagnosis not present

## 2015-01-06 DIAGNOSIS — G4730 Sleep apnea, unspecified: Secondary | ICD-10-CM | POA: Diagnosis not present

## 2015-01-06 DIAGNOSIS — Z87828 Personal history of other (healed) physical injury and trauma: Secondary | ICD-10-CM | POA: Insufficient documentation

## 2015-01-06 DIAGNOSIS — Z8639 Personal history of other endocrine, nutritional and metabolic disease: Secondary | ICD-10-CM | POA: Diagnosis not present

## 2015-01-06 DIAGNOSIS — J029 Acute pharyngitis, unspecified: Secondary | ICD-10-CM | POA: Diagnosis present

## 2015-01-06 DIAGNOSIS — R05 Cough: Secondary | ICD-10-CM

## 2015-01-06 DIAGNOSIS — F419 Anxiety disorder, unspecified: Secondary | ICD-10-CM | POA: Insufficient documentation

## 2015-01-06 DIAGNOSIS — R071 Chest pain on breathing: Secondary | ICD-10-CM

## 2015-01-06 DIAGNOSIS — Z87440 Personal history of urinary (tract) infections: Secondary | ICD-10-CM | POA: Diagnosis not present

## 2015-01-06 DIAGNOSIS — R059 Cough, unspecified: Secondary | ICD-10-CM

## 2015-01-06 DIAGNOSIS — J069 Acute upper respiratory infection, unspecified: Secondary | ICD-10-CM | POA: Diagnosis not present

## 2015-01-06 DIAGNOSIS — Z9981 Dependence on supplemental oxygen: Secondary | ICD-10-CM | POA: Insufficient documentation

## 2015-01-06 DIAGNOSIS — M170 Bilateral primary osteoarthritis of knee: Secondary | ICD-10-CM | POA: Diagnosis not present

## 2015-01-06 DIAGNOSIS — Z79899 Other long term (current) drug therapy: Secondary | ICD-10-CM | POA: Diagnosis not present

## 2015-01-06 DIAGNOSIS — F329 Major depressive disorder, single episode, unspecified: Secondary | ICD-10-CM | POA: Diagnosis not present

## 2015-01-06 DIAGNOSIS — R0789 Other chest pain: Secondary | ICD-10-CM | POA: Insufficient documentation

## 2015-01-06 LAB — I-STAT CHEM 8, ED
BUN: 11 mg/dL (ref 6–20)
CALCIUM ION: 1.14 mmol/L (ref 1.12–1.23)
Chloride: 104 mmol/L (ref 101–111)
Creatinine, Ser: 0.8 mg/dL (ref 0.44–1.00)
Glucose, Bld: 75 mg/dL (ref 65–99)
HCT: 46 % (ref 36.0–46.0)
Hemoglobin: 15.6 g/dL — ABNORMAL HIGH (ref 12.0–15.0)
Potassium: 4 mmol/L (ref 3.5–5.1)
SODIUM: 139 mmol/L (ref 135–145)
TCO2: 23 mmol/L (ref 0–100)

## 2015-01-06 LAB — CBC WITH DIFFERENTIAL/PLATELET
Basophils Absolute: 0 10*3/uL (ref 0.0–0.1)
Basophils Relative: 0 % (ref 0–1)
EOS ABS: 0.3 10*3/uL (ref 0.0–0.7)
EOS PCT: 2 % (ref 0–5)
HCT: 41.9 % (ref 36.0–46.0)
Hemoglobin: 13.7 g/dL (ref 12.0–15.0)
Lymphocytes Relative: 30 % (ref 12–46)
Lymphs Abs: 3.4 10*3/uL (ref 0.7–4.0)
MCH: 29.5 pg (ref 26.0–34.0)
MCHC: 32.7 g/dL (ref 30.0–36.0)
MCV: 90.1 fL (ref 78.0–100.0)
Monocytes Absolute: 0.5 10*3/uL (ref 0.1–1.0)
Monocytes Relative: 5 % (ref 3–12)
NEUTROS PCT: 63 % (ref 43–77)
Neutro Abs: 7 10*3/uL (ref 1.7–7.7)
PLATELETS: 336 10*3/uL (ref 150–400)
RBC: 4.65 MIL/uL (ref 3.87–5.11)
RDW: 13.8 % (ref 11.5–15.5)
WBC: 11.3 10*3/uL — AB (ref 4.0–10.5)

## 2015-01-06 LAB — I-STAT TROPONIN, ED: TROPONIN I, POC: 0 ng/mL (ref 0.00–0.08)

## 2015-01-06 MED ORDER — NAPROXEN 500 MG PO TABS: 500.0000 mg | ORAL_TABLET | Freq: Two times a day (BID) | ORAL | Status: DC | PRN

## 2015-01-06 MED ORDER — HYDROCODONE-HOMATROPINE 5-1.5 MG/5ML PO SYRP: 5.0000 mL | ORAL_SOLUTION | Freq: Four times a day (QID) | ORAL | Status: DC | PRN

## 2015-01-06 MED ORDER — MORPHINE SULFATE (PF) 4 MG/ML IV SOLN
4.0000 mg | Freq: Once | INTRAVENOUS | Status: AC
  Administered 2015-01-06: 4 mg via INTRAVENOUS
  Filled 2015-01-06: qty 1

## 2015-01-06 NOTE — ED Notes (Signed)
Pt arrived to the ED with a complaint of a sore throat and sinus problems.  Pt has surgery for a deviated septum.  Pt has been on antibiotics.  Her ear nose and throat doctror believes she has a bronchitis.  Pt states her throat hurts and it goes to her chest.

## 2015-01-06 NOTE — ED Provider Notes (Signed)
CSN: [id number]     Arrival date & time 01/06/15  1847 History   First MD Initiated Contact with Patient 01/06/15 2115     Chief Complaint  Patient presents with  . Sore Throat     (Consider location/radiation/quality/duration/timing/severity/associated sxs/prior Treatment) HPI Comments: [name] is a 39 y.o. female with a PMHx of sleep apnea on CPAP, exertional SOB, chronic sinusitis, bronchitis, and PSHx of b/l nasal septoplasty with turbinoplasty on 12/11/14, who presents to the ED with multiple complaints. Her primary complaint is one day of cough, sinus congestion, intermittent sore throat, chest pain, intermittent headaches, and fatigue. She reports her chest pain is 5/10 tightness which is constant centrally located and nonradiating, worse with coughing, and unrelieved with Tylenol. She reports that she went her ENT doctors on Friday and was diagnosed with sinusitis, given an antibiotic which she cannot recall the name of, and that her ENT thought that she may have bronchitis as well. She has a cough with yellow sputum production and yellow rhinorrhea. She states her headaches are mild and consistent with her prior sinus infections. She denies any fevers, chills, shortness of breath, wheezing, hemoptysis, leg swelling, ear pain or drainage, drooling, trismus, abdominal pain, nausea, vomiting, diarrhea, constipation, numbness, tingling, weakness, myalgias, arthralgias, rashes, diaphoresis, lightheadedness, or vision changes. She is a nonsmoker. She is not sure of any family history of cardiac disease, but states that she thinks her father side of the family may have some cardiac illness. No sick contacts. No OCP use.  Patient is a 39 y.o. female presenting with pharyngitis and chest pain. The history is provided by the patient. No language interpreter was used.  Sore Throat Associated symptoms include chest pain, congestion, coughing (yellow sputum), fatigue, headaches (intermittent) and a  sore throat. Pertinent negatives include no abdominal pain, arthralgias, chills, diaphoresis, fever, myalgias, nausea, neck pain, numbness, vomiting or weakness.  Chest Pain Pain location:  Substernal area Pain quality: tightness   Pain radiates to:  Does not radiate Pain radiates to the back: no   Pain severity:  Moderate Onset quality:  Gradual Duration:  1 day Timing:  Constant Progression:  Unchanged Chronicity:  New Context comment:  URI Relieved by: tylenol. Worsened by:  Coughing Ineffective treatments:  None tried Associated symptoms: cough (yellow sputum), fatigue and headache (intermittent)   Associated symptoms: no abdominal pain, no diaphoresis, no dysphagia, no fever, no lower extremity edema, no nausea, no numbness, no shortness of breath, not vomiting and no weakness   Risk factors: no birth control, no coronary artery disease, no diabetes mellitus, no immobilization, no prior DVT/PE and no smoking     Past Medical History  Diagnosis Date  . Depression   . Ovarian failure   . Anxiety   . UTI (lower urinary tract infection)   . Sleep apnea     cpap  . Shortness of breath     exertion from crutches  . GERD (gastroesophageal reflux disease)   . Closed fracture of right distal fibula 11/19/2013  . Chronic sinus infection   . Complication of anesthesia     pt. states difficult to wake up  . History of bronchitis   . Headache     pt. states random migraines  . Wears glasses   . Arthritis     pt. states in knees  . High cholesterol    Past Surgical History  Procedure Laterality Date  . Cholecystectomy  2010  . Cholecystectomy    . Wisdom tooth  extraction    . Orif fibula fracture Right 11/19/2013    Procedure: OPEN REDUCTION INTERNAL FIXATION (ORIF) RIGHT FIBULA FRACTURE;  Surgeon: Eulas Post, MD;  Location: [facility] OR;  Service: Orthopedics;  Laterality: Right;  . Esophagogastroduodenoscopy    . Nasal septoplasty w/ turbinoplasty Bilateral 12/11/2014     Procedure: NASAL SEPTOPLASTY AND BILATERAL INFERIOR TURBINATE REDUCTION;  Surgeon: Osborn Coho, MD;  Location: [facility] OR;  Service: ENT;  Laterality: Bilateral;   Family History  Problem Relation Age of Onset  . Allergies Sister   . Heart disease Paternal Grandmother   . Cancer Paternal Grandmother     BREAST CANCER  . Cancer Paternal Grandfather    Social History  Substance Use Topics  . Smoking status: Former Smoker    Types: Cigarettes    Quit date: 05/24/2010  . Smokeless tobacco: Never Used     Comment: Pt. states she used to be a social smoker  . Alcohol Use: Yes     Comment: occasionally   OB History    No data available     Review of Systems  Constitutional: Positive for fatigue. Negative for fever, chills and diaphoresis.  HENT: Positive for congestion, rhinorrhea, sinus pressure and sore throat. Negative for drooling, ear discharge, ear pain and trouble swallowing.   Eyes: Negative for visual disturbance.  Respiratory: Positive for cough (yellow sputum). Negative for shortness of breath and wheezing.   Cardiovascular: Positive for chest pain. Negative for leg swelling.  Gastrointestinal: Negative for nausea, vomiting, abdominal pain, diarrhea and constipation.  Genitourinary: Negative for dysuria and hematuria.  Musculoskeletal: Negative for myalgias, arthralgias and neck pain.  Skin: Negative for color change.  Allergic/Immunologic: Negative for immunocompromised state.  Neurological: Positive for headaches (intermittent). Negative for weakness, light-headedness and numbness.  Psychiatric/Behavioral: Negative for confusion.   10 Systems reviewed and are negative for acute change except as noted in the HPI.    Allergies  Sulfa antibiotics  Home Medications   Prior to Admission medications   Medication Sig Start Date End Date Taking? Authorizing Provider  acetaminophen (TYLENOL) 500 MG tablet Take 1,000 mg by mouth every 6 (six) hours as needed for mild pain.     Historical Provider, MD  amoxicillin-clavulanate (AUGMENTIN) 500-125 MG per tablet Take 1 tablet (500 mg total) by mouth 2 (two) times daily. 12/11/14   Osborn Coho, MD  buPROPion (WELLBUTRIN XL) 150 MG 24 hr tablet Take 150 mg by mouth daily.    Historical Provider, MD  CALCIUM PO Take 2 tablets by mouth daily.    Historical Provider, MD  cetirizine (ZYRTEC) 10 MG tablet Take 10 mg by mouth at bedtime.     Historical Provider, MD  HYDROcodone-acetaminophen (NORCO) 5-325 MG per tablet Take 1-2 tablets by mouth every 6 (six) hours as needed. 12/11/14   Osborn Coho, MD  ibuprofen (ADVIL,MOTRIN) 800 MG tablet Take 1 tablet (800 mg total) by mouth 3 (three) times daily. 10/12/14   Ladona Mow, PA-C  methocarbamol (ROBAXIN) 500 MG tablet Take 1 tablet (500 mg total) by mouth 2 (two) times daily. Patient not taking: Reported on 11/29/2014 10/12/14   Ladona Mow, PA-C  ondansetron (ZOFRAN) 4 MG tablet Take 1 tablet (4 mg total) by mouth every 8 (eight) hours as needed for nausea or vomiting. Patient not taking: Reported on 11/29/2014 10/12/14   Ladona Mow, PA-C  pseudoephedrine (SUDAFED 12 HOUR) 120 MG 12 hr tablet Take 1 tablet (120 mg total) by mouth 2 (two) times daily. Patient not  taking: Reported on 10/12/2014 08/11/14   Earley Favor, NP  sertraline (ZOLOFT) 100 MG tablet Take 100 mg by mouth daily.    Historical Provider, MD   BP 116/83 mmHg  Pulse 86  Temp(Src) 98.6 F (37 C) (Oral)  Resp 18  SpO2 98% Physical Exam  Constitutional: She is oriented to person, place, and time. Vital signs are normal. She appears well-developed and well-nourished.  Non-toxic appearance. No distress.  Afebrile, nontoxic, NAD  HENT:  Head: Normocephalic and atraumatic.  Right Ear: Hearing, tympanic membrane, external ear and ear canal normal.  Left Ear: Hearing, tympanic membrane, external ear and ear canal normal.  Nose: Mucosal edema and rhinorrhea present. Right sinus exhibits maxillary sinus tenderness and  frontal sinus tenderness. Left sinus exhibits maxillary sinus tenderness and frontal sinus tenderness.  Mouth/Throat: Uvula is midline, oropharynx is clear and moist and mucous membranes are normal. No trismus in the jaw. No uvula swelling.  Ears are clear bilaterally. Nose with mild edema and clear rhinorrhea, diffuse sinus TTP. Oropharynx clear and moist, without uvular swelling or deviation, no trismus or drooling, no tonsillar swelling or erythema, no exudates.    Eyes: Conjunctivae and EOM are normal. Pupils are equal, round, and reactive to light. Right eye exhibits no discharge. Left eye exhibits no discharge.  Neck: Normal range of motion. Neck supple.  No meningismus  Cardiovascular: Normal rate, regular rhythm, normal heart sounds and intact distal pulses.  Exam reveals no gallop and no friction rub.   No murmur heard. RRR, nl s1/s2, no m/r/g, distal pulses intact, no pedal edema   Pulmonary/Chest: Effort normal and breath sounds normal. No respiratory distress. She has no decreased breath sounds. She has no wheezes. She has no rhonchi. She has no rales. She exhibits tenderness. She exhibits no crepitus, no deformity and no retraction.    CTAB in all lung fields, no w/r/r, no hypoxia or increased WOB, speaking in full sentences, SpO2 98% on RA  Chest wall with anterior TTP, no crepitus/deformity/retractions  Abdominal: Soft. Normal appearance and bowel sounds are normal. She exhibits no distension. There is no tenderness. There is no rigidity, no rebound, no guarding, no CVA tenderness, no tenderness at McBurney's point and negative Murphy's sign.  Musculoskeletal: Normal range of motion.  Lymphadenopathy:       Head (right side): No submandibular and no tonsillar adenopathy present.       Head (left side): No submandibular and no tonsillar adenopathy present.    She has no cervical adenopathy.  No head/neck LAD  Neurological: She is alert and oriented to person, place, and time. She  has normal strength. No sensory deficit.  No focal neuro deficits  Skin: Skin is warm, dry and intact. No rash noted.  Psychiatric: She has a normal mood and affect.  Nursing note and vitals reviewed.   ED Course  Procedures (including critical care time) Labs Review Labs Reviewed  CBC WITH DIFFERENTIAL/PLATELET - Abnormal; Notable for the following:    WBC 11.3 (*)    All other components within normal limits  I-STAT CHEM 8, ED - Abnormal; Notable for the following:    Hemoglobin 15.6 (*)    All other components within normal limits  I-STAT TROPOININ, ED    Imaging Review Dg Chest 2 View  01/06/2015   CLINICAL DATA:  Sinus infection on antibiotics with chest pain beginning today and low-grade fever.  EXAM: CHEST  2 VIEW  COMPARISON:  11/09/2012  FINDINGS: Lungs are hypoinflated without consolidation  or effusion. Cardiomediastinal silhouette, bones and soft tissues are within normal.  IMPRESSION: No active cardiopulmonary disease.   Electronically Signed   By: Elberta Fortis M.D.   On: 01/06/2015 21:54   I, Camprubi-Soms, [place], personally reviewed and evaluated these images and lab results as part of my medical decision-making.   EKG Interpretation   Date/Time:  Monday January 06 2015 19:12:01 EDT Ventricular Rate:  83 PR Interval:  139 QRS Duration: 89 QT Interval:  378 QTC Calculation: 444 R Axis:   43 Text Interpretation:  Sinus rhythm Low voltage, precordial leads  Borderline T abnormalities, anterior leads Baseline wander in lead(s) III  aVF V1 V3 V5 V6 Confirmed by [facility] MD, Barbara Cower [phone]) on 01/06/2015 7:18:27  PM      MDM   Final diagnoses:  URI (upper respiratory infection)  Cough  Costochondral chest pain  Acute recurrent sinusitis, unspecified location    39 y.o. female here with URI symptoms including sinus pressure/rhinorrhea, sore throat intermittently, cough with yellow sputum production, and chest pain with coughing. ENT doctor started her  on abx for sinusitis, stated he believed she may also have bronchitis given her symptoms. Chest pain is reproducible on exam. Throat clear, pt afebrile. Doubt strep given low centor criteria. Given CP complaint, will get basic labs and CXR, clear lungs on exam, no wheezing. EKG unremarkable. Will give pain meds here and reassess shortly.   11:02 PM Trop neg. CXR unremarkable. Chem 8 WNL. CBC w/diff unremarkable (WBC 11.8 but no changes on differential). CP likely from coughing/URI. Will send home with naprosyn and hycodan for pain/coughing. Discussed OTC symptomatic care. Will have her f/up with PCP in 1wk. I explained the diagnosis and have given explicit precautions to return to the ER including for any other new or worsening symptoms. The patient understands and accepts the medical plan as it's been dictated and I have answered their questions. Discharge instructions concerning home care and prescriptions have been given. The patient is STABLE and is discharged to home in good condition.  BP 116/83 mmHg  Pulse 86  Temp(Src) 98.6 F (37 C) (Oral)  Resp 18  SpO2 98%  Meds ordered this encounter  Medications  . morphine 4 MG/ML injection 4 mg    Sig:   . HYDROcodone-homatropine (HYCODAN) 5-1.5 MG/5ML syrup    Sig: Take 5 mLs by mouth every 6 (six) hours as needed for cough.    Dispense:  120 mL    Refill:  0    Order Specific Question:  Supervising Provider    Answer:  MILLER, BRIAN [[id number]]  . naproxen (NAPROSYN) 500 MG tablet    Sig: Take 1 tablet (500 mg total) by mouth 2 (two) times daily as needed for mild pain, moderate pain or headache (TAKE WITH MEALS.).    Dispense:  20 tablet    Refill:  0    Order Specific Question:  Supervising Provider    Answer:  Eber Hong [[id number]]     Myshawn Chiriboga Camprubi-Soms, PA-C 01/06/15 2312  Marily Memos, MD 01/07/15 [phone]

## 2015-01-06 NOTE — Discharge Instructions (Signed)
Continue to stay well-hydrated. Gargle warm salt water and spit it out. Use hycodan and naprosyn as needed for cough suppression/pain. Use tylenol as needed for additional relief, and to help with any fevers. Use Mucinex for cough suppression/expectoration of mucus. Use netipot and flonase to help with nasal congestion. Continue your antibiotic for sinusitis. May consider over-the-counter Benadryl or other antihistamine to decrease secretions and for watery itchy eyes. Followup with your primary care doctor in 5-7 days for recheck of ongoing symptoms. Return to emergency department for emergent changing or worsening of symptoms.   Chest Wall Pain Chest wall pain is pain felt in or around the chest bones and muscles. It may take up to 6 weeks to get better. It may take longer if you are active. Chest wall pain can happen on its own. Other times, things like germs, injury, coughing, or exercise can cause the pain. HOME CARE   Avoid activities that make you tired or cause pain. Try not to use your chest, belly (abdominal), or side muscles. Do not use heavy weights.  Put ice on the sore area.  Put ice in a plastic bag.  Place a towel between your skin and the bag.  Leave the ice on for 15-20 minutes for the first 2 days.  Only take medicine as told by your doctor. GET HELP RIGHT AWAY IF:   You have more pain or are very uncomfortable.  You have a fever.  Your chest pain gets worse.  You have new problems.  You feel sick to your stomach (nauseous) or throw up (vomit).  You start to sweat or feel lightheaded.  You have a cough with mucus (phlegm).  You cough up blood. MAKE SURE YOU:   Understand these instructions.  Will watch your condition.  Will get help right away if you are not doing well or get worse. Document Released: 10/27/2007 Document Revised: 08/02/2011 Document Reviewed: 01/04/2011 [facility] Patient Information 2015 [place], [place]. This information is not intended to  replace advice given to you by your health care provider. Make sure you discuss any questions you have with your health care provider.  Cough, Adult  A cough is a reflex. It helps you clear your throat and airways. A cough can help heal your body. A cough can last 2 or 3 weeks (acute) or may last more than 8 weeks (chronic). Some common causes of a cough can include an infection, allergy, or a cold. HOME CARE  Only take medicine as told by your doctor.  If given, take your medicines (antibiotics) as told. Finish them even if you start to feel better.  Use a cold steam vaporizer or humidifier in your home. This can help loosen thick spit (secretions).  Sleep so you are almost sitting up (semi-upright). Use pillows to do this. This helps reduce coughing.  Rest as needed.  Stop smoking if you smoke. GET HELP RIGHT AWAY IF:  You have yellowish-white fluid (pus) in your thick spit.  Your cough gets worse.  Your medicine does not reduce coughing, and you are losing sleep.  You cough up blood.  You have trouble breathing.  Your pain gets worse and medicine does not help.  You have a fever. MAKE SURE YOU:   Understand these instructions.  Will watch your condition.  Will get help right away if you are not doing well or get worse. Document Released: 01/21/2011 Document Revised: 09/24/2013 Document Reviewed: 01/21/2011 [facility] Patient Information 2015 [place], [place]. This information is not intended  to replace advice given to you by your health care provider. Make sure you discuss any questions you have with your health care provider.  Costochondritis Costochondritis is a condition in which the tissue (cartilage) that connects your ribs with your breastbone (sternum) becomes irritated. It causes pain in the chest and rib area. It usually goes away on its own over time. HOME CARE  Avoid activities that wear you out.  Do not strain your ribs. Avoid activities that use  your:  Chest.  Belly.  Side muscles.  Put ice on the area for the first 2 days after the pain starts.  Put ice in a plastic bag.  Place a towel between your skin and the bag.  Leave the ice on for 20 minutes, 2-3 times a day.  Only take medicine as told by your doctor. GET HELP IF:  You have redness or puffiness (swelling) in the rib area.  Your pain does not go away with rest or medicine. GET HELP RIGHT AWAY IF:   Your pain gets worse.  You are very uncomfortable.  You have trouble breathing.  You cough up blood.  You start sweating or throwing up (vomiting).  You have a fever or lasting symptoms for more than 2-3 days.  You have a fever and your symptoms suddenly get worse. MAKE SURE YOU:   Understand these instructions.  Will watch your condition.  Will get help right away if you are not doing well or get worse. Document Released: 10/27/2007 Document Revised: 01/10/2013 Document Reviewed: 12/12/2012 [facility] Patient Information 2015 [place], [place]. This information is not intended to replace advice given to you by your health care provider. Make sure you discuss any questions you have with your health care provider.  Sinusitis Sinusitis is redness, soreness, and puffiness (inflammation) of the air pockets in the bones of your face (sinuses). The redness, soreness, and puffiness can cause air and mucus to get trapped in your sinuses. This can allow germs to grow and cause an infection.  HOME CARE   Drink enough fluids to keep your pee (urine) clear or pale yellow.  Use a humidifier in your home.  Run a hot shower to create steam in the bathroom. Sit in the bathroom with the door closed. Breathe in the steam 3-4 times a day.  Put a warm, moist washcloth on your face 3-4 times a day, or as told by your doctor.  Use salt water sprays (saline sprays) to wet the thick fluid in your nose. This can help the sinuses drain.  Only take medicine as told by your  doctor. GET HELP RIGHT AWAY IF:   Your pain gets worse.  You have very bad headaches.  You are sick to your stomach (nauseous).  You throw up (vomit).  You are very sleepy (drowsy) all the time.  Your face is puffy (swollen).  Your vision changes.  You have a stiff neck.  You have trouble breathing. MAKE SURE YOU:   Understand these instructions.  Will watch your condition.  Will get help right away if you are not doing well or get worse. Document Released: 10/27/2007 Document Revised: 02/02/2012 Document Reviewed: 12/14/2011 [facility] Patient Information 2015 [place], [place]. This information is not intended to replace advice given to you by your health care provider. Make sure you discuss any questions you have with your health care provider.  Upper Respiratory Infection, Adult An upper respiratory infection (URI) is also known as the common cold. It is often caused by  a type of germ (virus). Colds are easily spread (contagious). You can pass it to others by kissing, coughing, sneezing, or drinking out of the same glass. Usually, you get better in 1 or 2 weeks.  HOME CARE   Only take medicine as told by your doctor.  Use a warm mist humidifier or breathe in steam from a hot shower.  Drink enough water and fluids to keep your pee (urine) clear or pale yellow.  Get plenty of rest.  Return to work when your temperature is back to normal or as told by your doctor. You may use a face mask and wash your hands to stop your cold from spreading. GET HELP RIGHT AWAY IF:   After the first few days, you feel you are getting worse.  You have questions about your medicine.  You have chills, shortness of breath, or brown or red spit (mucus).  You have yellow or brown snot (nasal discharge) or pain in the face, especially when you bend forward.  You have a fever, puffy (swollen) neck, pain when you swallow, or white spots in the back of your throat.  You have a bad headache,  ear pain, sinus pain, or chest pain.  You have a high-pitched whistling sound when you breathe in and out (wheezing).  You have a lasting cough or cough up blood.  You have sore muscles or a stiff neck. MAKE SURE YOU:   Understand these instructions.  Will watch your condition.  Will get help right away if you are not doing well or get worse. Document Released: 10/27/2007 Document Revised: 08/02/2011 Document Reviewed: 08/15/2013 [facility] Patient Information 2015 [place], [place]. This information is not intended to replace advice given to you by your health care provider. Make sure you discuss any questions you have with your health care provider.

## 2015-01-08 ENCOUNTER — Encounter (HOSPITAL_COMMUNITY): Payer: Self-pay | Admitting: Emergency Medicine

## 2015-01-08 ENCOUNTER — Emergency Department (HOSPITAL_COMMUNITY)
Admission: EM | Admit: 2015-01-08 | Discharge: 2015-01-08 | Disposition: A | Discharge status: Home / Self Care | Payer: BLUE CROSS/BLUE SHIELD | Attending: Emergency Medicine | Admitting: Emergency Medicine

## 2015-01-08 DIAGNOSIS — F419 Anxiety disorder, unspecified: Secondary | ICD-10-CM | POA: Insufficient documentation

## 2015-01-08 DIAGNOSIS — Z8742 Personal history of other diseases of the female genital tract: Secondary | ICD-10-CM | POA: Insufficient documentation

## 2015-01-08 DIAGNOSIS — F329 Major depressive disorder, single episode, unspecified: Secondary | ICD-10-CM | POA: Insufficient documentation

## 2015-01-08 DIAGNOSIS — M170 Bilateral primary osteoarthritis of knee: Secondary | ICD-10-CM | POA: Insufficient documentation

## 2015-01-08 DIAGNOSIS — Z791 Long term (current) use of non-steroidal anti-inflammatories (NSAID): Secondary | ICD-10-CM | POA: Diagnosis not present

## 2015-01-08 DIAGNOSIS — R040 Epistaxis: Secondary | ICD-10-CM | POA: Insufficient documentation

## 2015-01-08 DIAGNOSIS — R079 Chest pain, unspecified: Secondary | ICD-10-CM | POA: Diagnosis not present

## 2015-01-08 DIAGNOSIS — G4730 Sleep apnea, unspecified: Secondary | ICD-10-CM | POA: Insufficient documentation

## 2015-01-08 DIAGNOSIS — Z8719 Personal history of other diseases of the digestive system: Secondary | ICD-10-CM | POA: Diagnosis not present

## 2015-01-08 DIAGNOSIS — Z792 Long term (current) use of antibiotics: Secondary | ICD-10-CM | POA: Insufficient documentation

## 2015-01-08 DIAGNOSIS — Z9981 Dependence on supplemental oxygen: Secondary | ICD-10-CM | POA: Insufficient documentation

## 2015-01-08 DIAGNOSIS — Z87891 Personal history of nicotine dependence: Secondary | ICD-10-CM | POA: Insufficient documentation

## 2015-01-08 DIAGNOSIS — Z79899 Other long term (current) drug therapy: Secondary | ICD-10-CM | POA: Insufficient documentation

## 2015-01-08 DIAGNOSIS — Z87440 Personal history of urinary (tract) infections: Secondary | ICD-10-CM | POA: Insufficient documentation

## 2015-01-08 LAB — COMPREHENSIVE METABOLIC PANEL
ALT: 80 U/L — AB (ref 14–54)
AST: 48 U/L — ABNORMAL HIGH (ref 15–41)
Albumin: 4.1 g/dL (ref 3.5–5.0)
Alkaline Phosphatase: 78 U/L (ref 38–126)
Anion gap: 8 (ref 5–15)
BUN: 14 mg/dL (ref 6–20)
CHLORIDE: 104 mmol/L (ref 101–111)
CO2: 26 mmol/L (ref 22–32)
CREATININE: 0.78 mg/dL (ref 0.44–1.00)
Calcium: 9 mg/dL (ref 8.9–10.3)
GFR calc non Af Amer: >60 mL/min (ref >60)
Glucose, Bld: 97 mg/dL (ref 65–99)
Potassium: 3.9 mmol/L (ref 3.5–5.1)
SODIUM: 138 mmol/L (ref 135–145)
Total Bilirubin: 0.5 mg/dL (ref 0.3–1.2)
Total Protein: 7.2 g/dL (ref 6.5–8.1)

## 2015-01-08 LAB — CBC
HCT: 37.7 % (ref 36.0–46.0)
HEMOGLOBIN: 12.2 g/dL (ref 12.0–15.0)
MCH: 28.6 pg (ref 26.0–34.0)
MCHC: 32.4 g/dL (ref 30.0–36.0)
MCV: 88.5 fL (ref 78.0–100.0)
Platelets: 302 10*3/uL (ref 150–400)
RBC: 4.26 MIL/uL (ref 3.87–5.11)
RDW: 13.8 % (ref 11.5–15.5)
WBC: 9.3 10*3/uL (ref 4.0–10.5)

## 2015-01-08 LAB — I-STAT TROPONIN, ED: Troponin i, poc: 0 ng/mL (ref 0.00–0.08)

## 2015-01-08 MED ORDER — ONDANSETRON 4 MG PO TBDP
4.0000 mg | ORAL_TABLET | Freq: Once | ORAL | Status: AC
  Administered 2015-01-08: 4 mg via ORAL
  Filled 2015-01-08: qty 1

## 2015-01-08 MED ORDER — OXYMETAZOLINE HCL 0.05 % NA SOLN
1.0000 | Freq: Once | NASAL | Status: AC
  Administered 2015-01-08: 1 via NASAL
  Filled 2015-01-08: qty 15

## 2015-01-08 NOTE — ED Notes (Addendum)
Per EMS pt comes from home for nose bleed that started about an hour ago when she woke up because she felt like something was dripping in her CPAP mask. Pt states that she took off her CPAP mask and was filled with blood.   Pt states that she had deviated septrum surgery on July 20 and currently has sinus infection.  Per EMS pt states that she now c/o chest tightness and trouble breathing upon arrival to the ED.  Pt states, "i have chest pressure from all the blood that went down my chest".  Pt also now c/o headache.

## 2015-01-08 NOTE — ED Notes (Signed)
Pt coughed up small blood clot; minimal bleeding at present

## 2015-01-08 NOTE — ED Provider Notes (Signed)
CSN: [id number]     Arrival date & time 01/08/15  7829 History   First MD Initiated Contact with Patient 01/08/15 1017     Chief Complaint  Patient presents with  . Epistaxis  . Chest Pain     (Consider location/radiation/quality/duration/timing/severity/associated sxs/prior Treatment) HPI   Social 39 year old female who presents via EMS for epistaxis. She has a past medical history of depression, morbid obesity, chronic sinus infections. Patient was recently diagnosed with a URI. Patient is status post left deviated septum repair by Dr. Annalee Genta, on 12/11/2014. Patient states that she awoke this morning with her CPAP on, because she felt something trickling down her nose. When she took her CPAP off. She states that her mask was filled with blood and her blood started gushing out of her nose down her shirt. She was swallowing clots and coughing. Patient states this isn't worse.She's ever had. She complains of associated nausea at this time. Patient states that her nosebleed, resolved after getting Afrin here in the emergency department. Patient also complaining of some chest pain. She states it was present before. Epistaxis episode this morning. She said it's burning and worse with cough. Patient currently taking Augmentin for sinus infection. She denies any easy bruising or bleeding. Patient not on blood thinning agents at this time.  Past Medical History  Diagnosis Date  . Depression   . Ovarian failure   . Anxiety   . UTI (lower urinary tract infection)   . Sleep apnea     cpap  . Shortness of breath     exertion from crutches  . GERD (gastroesophageal reflux disease)   . Closed fracture of right distal fibula 11/19/2013  . Chronic sinus infection   . Complication of anesthesia     pt. states difficult to wake up  . History of bronchitis   . Headache     pt. states random migraines  . Wears glasses   . Arthritis     pt. states in knees  . High cholesterol    Past Surgical  History  Procedure Laterality Date  . Cholecystectomy  2010  . Cholecystectomy    . Wisdom tooth extraction    . Orif fibula fracture Right 11/19/2013    Procedure: OPEN REDUCTION INTERNAL FIXATION (ORIF) RIGHT FIBULA FRACTURE;  Surgeon: Eulas Post, MD;  Location: [facility] OR;  Service: Orthopedics;  Laterality: Right;  . Esophagogastroduodenoscopy    . Nasal septoplasty w/ turbinoplasty Bilateral 12/11/2014    Procedure: NASAL SEPTOPLASTY AND BILATERAL INFERIOR TURBINATE REDUCTION;  Surgeon: Osborn Coho, MD;  Location: [facility] OR;  Service: ENT;  Laterality: Bilateral;   Family History  Problem Relation Age of Onset  . Allergies Sister   . Heart disease Paternal Grandmother   . Cancer Paternal Grandmother     BREAST CANCER  . Cancer Paternal Grandfather    Social History  Substance Use Topics  . Smoking status: Former Smoker    Types: Cigarettes    Quit date: 05/24/2010  . Smokeless tobacco: Never Used     Comment: Pt. states she used to be a social smoker  . Alcohol Use: Yes     Comment: occasionally   OB History    No data available     Review of Systems  Ten systems reviewed and are negative for acute change, except as noted in the HPI.    Allergies  Sulfa antibiotics  Home Medications   Prior to Admission medications   Medication Sig Start Date End Date  Taking? Authorizing Provider  acetaminophen (TYLENOL) 500 MG tablet Take 1,000 mg by mouth every 6 (six) hours as needed for mild pain.   Yes Historical Provider, MD  amoxicillin-clavulanate (AUGMENTIN) 500-125 MG per tablet Take 1 tablet (500 mg total) by mouth 2 (two) times daily. 12/11/14  Yes Osborn Coho, MD  buPROPion (WELLBUTRIN XL) 150 MG 24 hr tablet Take 150 mg by mouth daily.   Yes Historical Provider, MD  CALCIUM PO Take 2 tablets by mouth daily.   Yes Historical Provider, MD  cetirizine (ZYRTEC) 10 MG tablet Take 10 mg by mouth at bedtime.    Yes Historical Provider, MD  HYDROcodone-homatropine  (HYCODAN) 5-1.5 MG/5ML syrup Take 5 mLs by mouth every 6 (six) hours as needed for cough. 01/06/15  Yes Mercedes Camprubi-Soms, PA-C  ibuprofen (ADVIL,MOTRIN) 800 MG tablet Take 1 tablet (800 mg total) by mouth 3 (three) times daily. Patient taking differently: Take 800 mg by mouth every 8 (eight) hours as needed.  10/12/14  Yes Ladona Mow, PA-C  naproxen (NAPROSYN) 500 MG tablet Take 1 tablet (500 mg total) by mouth 2 (two) times daily as needed for mild pain, moderate pain or headache (TAKE WITH MEALS.). 01/06/15  Yes Mercedes Camprubi-Soms, PA-C  sertraline (ZOLOFT) 100 MG tablet Take 100 mg by mouth daily.   Yes Historical Provider, MD  sodium chloride (OCEAN) 0.65 % SOLN nasal spray Place 4 sprays into both nostrils every hour.   Yes Historical Provider, MD  HYDROcodone-acetaminophen (NORCO) 5-325 MG per tablet Take 1-2 tablets by mouth every 6 (six) hours as needed. Patient not taking: Reported on 01/06/2015 12/11/14   Osborn Coho, MD  methocarbamol (ROBAXIN) 500 MG tablet Take 1 tablet (500 mg total) by mouth 2 (two) times daily. Patient not taking: Reported on 11/29/2014 10/12/14   Ladona Mow, PA-C  ondansetron (ZOFRAN) 4 MG tablet Take 1 tablet (4 mg total) by mouth every 8 (eight) hours as needed for nausea or vomiting. Patient not taking: Reported on 11/29/2014 10/12/14   Ladona Mow, PA-C  pseudoephedrine (SUDAFED 12 HOUR) 120 MG 12 hr tablet Take 1 tablet (120 mg total) by mouth 2 (two) times daily. Patient not taking: Reported on 10/12/2014 08/11/14   Earley Favor, NP   BP 118/60 mmHg  Pulse 76  Resp 20  SpO2 99% Physical Exam  Constitutional: She is oriented to person, place, and time. She appears well-developed and well-nourished. No distress.  HENT:  Head: Normocephalic and atraumatic.  Nose: Epistaxis is observed.    Eyes: Conjunctivae are normal. No scleral icterus.  Neck: Normal range of motion.  Cardiovascular: Normal rate, regular rhythm and normal heart sounds.  Exam reveals no  gallop and no friction rub.   No murmur heard. Pulmonary/Chest: Effort normal and breath sounds normal. No respiratory distress.  Abdominal: Soft. Bowel sounds are normal. She exhibits no distension and no mass. There is no tenderness. There is no guarding.  Neurological: She is alert and oriented to person, place, and time.  Skin: Skin is warm and dry. She is not diaphoretic.    ED Course  Procedures (including critical care time) Labs Review Labs Reviewed  COMPREHENSIVE METABOLIC PANEL - Abnormal; Notable for the following:    AST 48 (*)    ALT 80 (*)    All other components within normal limits  CBC  I-STAT TROPOININ, ED    Imaging Review Dg Chest 2 View  01/06/2015   CLINICAL DATA:  Sinus infection on antibiotics with chest pain beginning today  and low-grade fever.  EXAM: CHEST  2 VIEW  COMPARISON:  11/09/2012  FINDINGS: Lungs are hypoinflated without consolidation or effusion. Cardiomediastinal silhouette, bones and soft tissues are within normal.  IMPRESSION: No active cardiopulmonary disease.   Electronically Signed   By: Elberta Fortis M.D.   On: 01/06/2015 21:54   I have personally reviewed and evaluated these images and lab results as part of my medical decision-making.   EKG Interpretation   Date/Time:  Wednesday January 08 2015 09:25:04 EDT Ventricular Rate:  70 PR Interval:  138 QRS Duration: 93 QT Interval:  396 QTC Calculation: 427 R Axis:   50 Text Interpretation:  Sinus rhythm Low voltage, precordial leads Confirmed  by Donnald Garre, MD, Lebron Conners [phone]) on 01/08/2015 9:28:08 AM      MDM   Final diagnoses:  Epistaxis    10:52 AM Patient with epistaxis. Resolved before provider evaluation. Patient's chest pain complaint seems to be associated with her recent diagnosis of URI. Patient case discussed with Dr. Loretha Stapler. Her hemoglobin is within normal limits. No active bleeding at this time. Patient will be discharged with epistaxis instructions, Afrin. Discussed  nasal hygiene. Patient has a follow-up appointment with Dr. Kelli Churn in early September. Patient is advised to return if bleeding is uncontrolled after 20 minutes of direct pressure and Afrin application. Patient is also to follow-up with Dr. Kelli Churn as soon as possible, especially if nasal bleeding is recurrent.  Arthor Captain, PA-C 01/08/15 1228  Blake Divine, MD 01/13/15 [phone]

## 2015-01-08 NOTE — ED Notes (Signed)
Bed: ZO10 Expected date: 01/08/15 Expected time: 9:12 AM Means of arrival: Ambulance Comments: EMS nosebleed

## 2015-01-08 NOTE — Discharge Instructions (Signed)
If your nose bleed recurs, spray afrin directly into each nostril, then spray gauze until it is wet with afrin, place in in the nostril that appears to have the bleeding, hold direct pressure to the soft part of the nose without letting go for at least 20 min. If bleeding is not controlled, you may return to the emergency department for further management.  Nosebleed Nosebleeds can be caused by many conditions, including trauma, infections, polyps, foreign bodies, dry mucous membranes or climate, medicines, and air conditioning. Most nosebleeds occur in the front of the nose. Because of this location, most nosebleeds can be controlled by pinching the nostrils gently and continuously for at least 10 to 20 minutes. The long, continuous pressure allows enough time for the blood to clot. If pressure is released during that 10 to 20 minute time period, the process may have to be started again. The nosebleed may stop by itself or quit with pressure, or it may need concentrated heating (cautery) or pressure from packing. HOME CARE INSTRUCTIONS   If your nose was packed, try to maintain the pack inside until your health care provider removes it. If a gauze pack was used and it starts to fall out, gently replace it or cut the end off. Do not cut if a balloon catheter was used to pack the nose. Otherwise, do not remove unless instructed.  Avoid blowing your nose for 12 hours after treatment. This could dislodge the pack or clot and start the bleeding again.  If the bleeding starts again, sit up and bend forward, gently pinching the front half of your nose continuously for 20 minutes.  If bleeding was caused by dry mucous membranes, use over-the-counter saline nasal spray or gel. This will keep the mucous membranes moist and allow them to heal. If you must use a lubricant, choose the water-soluble variety. Use it only sparingly and not within several hours of lying down.  Do not use petroleum jelly or mineral  oil, as these may drip into the lungs and cause serious problems.  Maintain humidity in your home by using less air conditioning or by using a humidifier.  Do not use aspirin or medicines which make bleeding more likely. Your health care provider can give you recommendations on this.  Resume normal activities as you are able, but try to avoid straining, lifting, or bending at the waist for several days.  If the nosebleeds become recurrent and the cause is unknown, your health care provider may suggest laboratory tests. SEEK MEDICAL CARE IF: You have a fever. SEEK IMMEDIATE MEDICAL CARE IF:   Bleeding recurs and cannot be controlled.  There is unusual bleeding from or bruising on other parts of the body.  Nosebleeds continue.  There is any worsening of the condition which originally brought you in.  You become light-headed, feel faint, become sweaty, or vomit blood. MAKE SURE YOU:   Understand these instructions.  Will watch your condition.  Will get help right away if you are not doing well or get worse. Document Released: 02/17/2005 Document Revised: 09/24/2013 Document Reviewed: 04/10/2009 [facility] Patient Information 2015 [place], [place]. This information is not intended to replace advice given to you by your health care provider. Make sure you discuss any questions you have with your health care provider.                    Nasal Hygiene The nose has many positive effects on the air you breathe in that you may  not be aware of. - Temperature regulation - Filtration and removal of particulate matter - Humidification - Defense against infections There are several things you can do to help keep your nose healthy. Foremost is nasal hygiene. This will help with your nose's natural function and keep it moist and healthy. Techniques to accomplish this are outlined below. These will help with nasal dryness, nasal crusting, and nose bleeds. They also assist with  clearing thick mucus that cause you to blow your nose frequently and may be associated with thick postnasal drip.  1. Use nasal saline daily. You can buy small bottles of this over-the-counter at the drug store or grocery store. Some brand names are: [address], [employer], [place]. Apply 2-3 sprays each nostril several times a day. If your nose feels dry or have had recent nasal surgery, try to use it every couple of hours. There is no medicine in it so it can be used as often as you like. Do NOT use the sprays containing decongestants. The appropriate way to apply nasal sprays: Place the nozzle just inside your nostril and point it towards the corner of your eye. Often, it is helpful to use the right-hand to spray into the left nasal cavity and use the left-hand to spray into the right side.  2. Use nasal saline irrigations and flushing.SinuCleanse, Simply Saline, [place]. Use this 1-2 times a day. We can also provide you with a recipe to use at home. Just ask [place]. To prevent reintroducing bacteria back into your nose, please keep your irrigation equipment clean and dry between uses. Throw away and replace reusable irrigation equipment every 3 weeks.   3. Use Vaseline petroleum jelly or Aquaphor. You can apply this gently to each nostril 2-3 times a day to promote moisturization for your nose. You may also use triple antibiotic ointment such as Neosporin or Bacitracin. These can all be bought over-the-counter.  4. Consider other nasal emollients. A few preparations are available over-thecounter. Ponaris, Nose Better, Pretz. Ask your pharmacist what is available. Also, some nasal saline sprays have additives such as aloe and these are helpful.  5. Consider using a humidifier at home. If your nose feels dry and/or you have frequent nose bleeds, you can buy a humidifier for your home. Be cautious in using these if you have mold allergies.  6. Avoid excessive manual manipulation of your nose and  nostrils. Frequent rubbing of your nostrils and the passing of tissues or fingers in your nostrils may aggravate nasal irritation from dryness and nose bleeds.

## 2015-01-09 ENCOUNTER — Encounter (HOSPITAL_COMMUNITY): Payer: Self-pay

## 2015-01-09 ENCOUNTER — Encounter: Payer: Self-pay | Admitting: Internal Medicine

## 2015-01-09 ENCOUNTER — Emergency Department (HOSPITAL_COMMUNITY)
Admission: EM | Admit: 2015-01-09 | Discharge: 2015-01-09 | Disposition: A | Discharge status: Home / Self Care | Payer: BLUE CROSS/BLUE SHIELD | Attending: Emergency Medicine | Admitting: Emergency Medicine

## 2015-01-09 ENCOUNTER — Ambulatory Visit (INDEPENDENT_AMBULATORY_CARE_PROVIDER_SITE_OTHER): Payer: BLUE CROSS/BLUE SHIELD | Admitting: Internal Medicine

## 2015-01-09 VITALS — BP 120/88 | HR 82 | Ht 69.0 in | Wt 296.6 lb

## 2015-01-09 DIAGNOSIS — Z8639 Personal history of other endocrine, nutritional and metabolic disease: Secondary | ICD-10-CM | POA: Diagnosis not present

## 2015-01-09 DIAGNOSIS — M170 Bilateral primary osteoarthritis of knee: Secondary | ICD-10-CM | POA: Insufficient documentation

## 2015-01-09 DIAGNOSIS — Z87891 Personal history of nicotine dependence: Secondary | ICD-10-CM | POA: Diagnosis not present

## 2015-01-09 DIAGNOSIS — Z8742 Personal history of other diseases of the female genital tract: Secondary | ICD-10-CM | POA: Diagnosis not present

## 2015-01-09 DIAGNOSIS — Z8709 Personal history of other diseases of the respiratory system: Secondary | ICD-10-CM | POA: Diagnosis not present

## 2015-01-09 DIAGNOSIS — Z9981 Dependence on supplemental oxygen: Secondary | ICD-10-CM | POA: Insufficient documentation

## 2015-01-09 DIAGNOSIS — F329 Major depressive disorder, single episode, unspecified: Secondary | ICD-10-CM | POA: Diagnosis not present

## 2015-01-09 DIAGNOSIS — J0100 Acute maxillary sinusitis, unspecified: Secondary | ICD-10-CM

## 2015-01-09 DIAGNOSIS — F419 Anxiety disorder, unspecified: Secondary | ICD-10-CM | POA: Insufficient documentation

## 2015-01-09 DIAGNOSIS — G4733 Obstructive sleep apnea (adult) (pediatric): Secondary | ICD-10-CM | POA: Diagnosis not present

## 2015-01-09 DIAGNOSIS — Z8719 Personal history of other diseases of the digestive system: Secondary | ICD-10-CM | POA: Diagnosis not present

## 2015-01-09 DIAGNOSIS — R040 Epistaxis: Secondary | ICD-10-CM | POA: Insufficient documentation

## 2015-01-09 DIAGNOSIS — Z87440 Personal history of urinary (tract) infections: Secondary | ICD-10-CM | POA: Insufficient documentation

## 2015-01-09 DIAGNOSIS — Z87828 Personal history of other (healed) physical injury and trauma: Secondary | ICD-10-CM | POA: Diagnosis not present

## 2015-01-09 DIAGNOSIS — G4730 Sleep apnea, unspecified: Secondary | ICD-10-CM | POA: Insufficient documentation

## 2015-01-09 DIAGNOSIS — J342 Deviated nasal septum: Secondary | ICD-10-CM | POA: Diagnosis not present

## 2015-01-09 MED ORDER — HYDROCODONE-ACETAMINOPHEN 5-325 MG PO TABS
1.0000 | ORAL_TABLET | Freq: Once | ORAL | Status: AC
  Administered 2015-01-09: 1 via ORAL
  Filled 2015-01-09: qty 1

## 2015-01-09 NOTE — ED Notes (Signed)
Patient arrives by EMS with complaint nosebleed left nare.  Patient seen here yesterday for same/controlled with Afrin, states oozing continuously since discharge.  Minimal bleeding at this time.

## 2015-01-09 NOTE — Discharge Instructions (Signed)
Nosebleed A nosebleed can be caused by many things, including:  Getting hit hard in the nose.  Infections.  Dry nose.  Colds.  Medicines. Your doctor may do lab testing if you get nosebleeds a lot and the cause is not known. HOME CARE   If your nose was packed with material, keep it there until your doctor takes it out. Put the pack back in your nose if the pack falls out.  Do not blow your nose for 12 hours after the nosebleed.  Sit up and bend forward if your nose starts bleeding again. Pinch the front half of your nose nonstop for 20 minutes.  Put petroleum jelly inside your nose every morning if you have a dry nose.  Use a humidifier to make the air less dry.  Do not take aspirin.  Try not to strain, lift, or bend at the waist for many days after the nosebleed. GET HELP RIGHT AWAY IF:   Nosebleeds keep happening and are hard to stop or control.  You have bleeding or bruises that are not normal on other parts of the body.  You have a fever.  The nosebleeds get worse.  You get lightheaded, feel faint, sweaty, or throw up (vomit) blood. MAKE SURE YOU:   Understand these instructions.  Will watch your condition.  Will get help right away if you are not doing well or get worse. Document Released: 02/17/2008 Document Revised: 08/02/2011 Document Reviewed: 02/17/2008 [facility] Patient Information 2015 [place], [place]. This information is not intended to replace advice given to you by your health care provider. Make sure you discuss any questions you have with your health care provider. Due to the repeat bleeding episodes that you had your in the left nare I  packed your nose with a Rhino Rocket 7.5, with 4 mils pressure.  This  stopped the bleeding.  Please call your ENT first thing in the morning to make an appointment for further evaluation.  Return for recurrent bleeding

## 2015-01-09 NOTE — ED Notes (Signed)
Pt is still oozing blood from left nare and reports 8/10 burning pain that "just hurts" in same location.  No additional s/s at this time.

## 2015-01-09 NOTE — ED Notes (Signed)
Bed: WA15 Expected date:  Expected time:  Means of arrival:  Comments: EMS nosebleed 
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
## 2015-01-09 NOTE — ED Notes (Signed)
Patient complaining of nasal congestion, generalized malaise and "I just don't feel good"

## 2015-01-09 NOTE — Patient Instructions (Signed)
Order- DME [facility]-  In one month ( after sinus surgery heals) need download CPAP for pressure compliance,   Dx OSA  We can continue CPAP 10. I hope you heal up quickly and can get back to wearing your CPAP soon.

## 2015-01-09 NOTE — ED Provider Notes (Signed)
CSN: [id number]     Arrival date & time 01/09/15  0316 History   First MD Initiated Contact with Patient 01/09/15 [phone]     Chief Complaint  Patient presents with  . Epistaxis     (Consider location/radiation/quality/duration/timing/severity/associated sxs/prior Treatment) HPI Comments: Patient was seen yesterday for epistaxis.  It was given Afrin nasal spray, states she's been using it as instructed, but continues to have left near bleeding.  She reports prior to any nosebleeds that she had nasal congestion, chest, chronic sinus congestion.  Does not report any history of nosebleeds  Patient is a 39 y.o. female presenting with nosebleeds. The history is provided by the patient.  Epistaxis Location:  L nare Severity:  Moderate Duration:  2 days Timing:  Intermittent Progression:  Worsening Chronicity:  New Context: home oxygen   Context: not anticoagulants, not bleeding disorder, not hypertension, not nose picking, not recent infection and not weather change   Relieved by:  Nothing Worsened by:  Nothing tried Ineffective treatments:  Vasoconstrictors Associated symptoms: blood in oropharynx and congestion   Associated symptoms: no dizziness, no fever and no headaches     Past Medical History  Diagnosis Date  . Depression   . Ovarian failure   . Anxiety   . UTI (lower urinary tract infection)   . Sleep apnea     cpap  . Shortness of breath     exertion from crutches  . GERD (gastroesophageal reflux disease)   . Closed fracture of right distal fibula 11/19/2013  . Chronic sinus infection   . Complication of anesthesia     pt. states difficult to wake up  . History of bronchitis   . Headache     pt. states random migraines  . Wears glasses   . Arthritis     pt. states in knees  . High cholesterol    Past Surgical History  Procedure Laterality Date  . Cholecystectomy  2010  . Cholecystectomy    . Wisdom tooth extraction    . Orif fibula fracture Right 11/19/2013   Procedure: OPEN REDUCTION INTERNAL FIXATION (ORIF) RIGHT FIBULA FRACTURE;  Surgeon: Eulas Post, MD;  Location: [facility] OR;  Service: Orthopedics;  Laterality: Right;  . Esophagogastroduodenoscopy    . Nasal septoplasty w/ turbinoplasty Bilateral 12/11/2014    Procedure: NASAL SEPTOPLASTY AND BILATERAL INFERIOR TURBINATE REDUCTION;  Surgeon: Osborn Coho, MD;  Location: [facility] OR;  Service: ENT;  Laterality: Bilateral;   Family History  Problem Relation Age of Onset  . Allergies Sister   . Heart disease Paternal Grandmother   . Cancer Paternal Grandmother     BREAST CANCER  . Cancer Paternal Grandfather    Social History  Substance Use Topics  . Smoking status: Former Smoker    Types: Cigarettes    Quit date: 05/24/2010  . Smokeless tobacco: Never Used     Comment: Pt. states she used to be a social smoker  . Alcohol Use: Yes     Comment: occasionally   OB History    No data available     Review of Systems  Constitutional: Negative for fever.  HENT: Positive for congestion and nosebleeds.   Respiratory: Negative for shortness of breath.   Cardiovascular: Negative for chest pain.  Neurological: Negative for dizziness and headaches.  All other systems reviewed and are negative.     Allergies  Sulfa antibiotics  Home Medications   Prior to Admission medications   Medication Sig Start Date End Date Taking? Authorizing  Provider  acetaminophen (TYLENOL) 500 MG tablet Take 1,000 mg by mouth every 6 (six) hours as needed for mild pain.   Yes Historical Provider, MD  amoxicillin-clavulanate (AUGMENTIN) 500-125 MG per tablet Take 1 tablet (500 mg total) by mouth 2 (two) times daily. Patient taking differently: Take 1 tablet by mouth 2 (two) times daily. For 10 days 12/11/14  Yes Osborn Coho, MD  buPROPion (WELLBUTRIN XL) 150 MG 24 hr tablet Take 150 mg by mouth daily.   Yes Historical Provider, MD  cetirizine (ZYRTEC) 10 MG tablet Take 10 mg by mouth at bedtime.    Yes  Historical Provider, MD  HYDROcodone-homatropine (HYCODAN) 5-1.5 MG/5ML syrup Take 5 mLs by mouth every 6 (six) hours as needed for cough. 01/06/15  Yes Mercedes Camprubi-Soms, PA-C  ibuprofen (ADVIL,MOTRIN) 800 MG tablet Take 1 tablet (800 mg total) by mouth 3 (three) times daily. Patient taking differently: Take 800 mg by mouth every 8 (eight) hours as needed.  10/12/14  Yes Ladona Mow, PA-C  naproxen (NAPROSYN) 500 MG tablet Take 1 tablet (500 mg total) by mouth 2 (two) times daily as needed for mild pain, moderate pain or headache (TAKE WITH MEALS.). 01/06/15  Yes Mercedes Camprubi-Soms, PA-C  sertraline (ZOLOFT) 100 MG tablet Take 100 mg by mouth daily.   Yes Historical Provider, MD  sodium chloride (OCEAN) 0.65 % SOLN nasal spray Place 4 sprays into both nostrils every hour.   Yes Historical Provider, MD  HYDROcodone-acetaminophen (NORCO) 5-325 MG per tablet Take 1-2 tablets by mouth every 6 (six) hours as needed. Patient not taking: Reported on 01/06/2015 12/11/14   Osborn Coho, MD  methocarbamol (ROBAXIN) 500 MG tablet Take 1 tablet (500 mg total) by mouth 2 (two) times daily. Patient not taking: Reported on 11/29/2014 10/12/14   Ladona Mow, PA-C  ondansetron (ZOFRAN) 4 MG tablet Take 1 tablet (4 mg total) by mouth every 8 (eight) hours as needed for nausea or vomiting. Patient not taking: Reported on 11/29/2014 10/12/14   Ladona Mow, PA-C  pseudoephedrine (SUDAFED 12 HOUR) 120 MG 12 hr tablet Take 1 tablet (120 mg total) by mouth 2 (two) times daily. Patient not taking: Reported on 10/12/2014 08/11/14   Earley Favor, NP   BP 116/65 mmHg  Pulse 68  Temp(Src) 98.5 F (36.9 C) (Oral)  Resp 20  Ht 5\' 9"  (1.753 m)  Wt 295 lb (133.811 kg)  BMI 43.54 kg/m2  SpO2 97% Physical Exam  Constitutional: She appears well-developed and well-nourished.  HENT:  Head: Normocephalic and atraumatic.  Nose: Epistaxis is observed.  Eyes: Pupils are equal, round, and reactive to light.  Neck: Normal range of  motion.  Cardiovascular: Normal rate.   Pulmonary/Chest: Effort normal.  Musculoskeletal: Normal range of motion.  Neurological: She is alert.  Skin: Skin is warm.    ED Course  EPISTAXIS MANAGEMENT Date/Time: 01/09/2015 4:07 AM Performed by: Earley Favor Authorized by: Earley Favor Consent: The procedure was performed in an emergent situation. Verbal consent obtained. Written consent not obtained. Consent given by: patient Patient understanding: patient states understanding of the procedure being performed Patient identity confirmed: verbally with patient Patient sedated: no Repair method: nasal balloon Post-procedure assessment: bleeding stopped Treatment complexity: simple Patient tolerance: Patient tolerated the procedure well with no immediate complications Comments: 7.5 Rhino Rocket placed without incident.  4 cc of air placed within the bleeding has ceased   (including critical care time) Labs Review Labs Reviewed - No data to display  Imaging Review No results found.  I have personally reviewed and evaluated these images and lab results as part of my medical decision-making.   EKG Interpretation None     No further episodes of bleeding through the packing.  She does have an ENT that she can follow-up with today.  She has Vicodin and Hycodan cough syrup at home for pain control MDM   Final diagnoses:  Left-sided epistaxis        Earley Favor, NP 01/09/15 1610  Loren Racer, MD 01/09/15 [phone]

## 2015-01-10 NOTE — Assessment & Plan Note (Signed)
Now completed a course of Augmentin for sinusitis

## 2015-01-10 NOTE — Assessment & Plan Note (Signed)
She reports good compliance and control with CPAP until she had septoplasty surgery complicated by sinusitis and epistaxis. Has those resolve she plans prompt return to CPAP. Plan-download when she is using CPAP again

## 2015-01-10 NOTE — Assessment & Plan Note (Signed)
Septoplasty by Dr. Annalee Genta 12/11/2014

## 2015-01-11 ENCOUNTER — Encounter (HOSPITAL_COMMUNITY): Payer: Self-pay | Admitting: Emergency Medicine

## 2015-01-11 ENCOUNTER — Emergency Department (HOSPITAL_COMMUNITY)
Admission: EM | Admit: 2015-01-11 | Discharge: 2015-01-11 | Disposition: A | Discharge status: Home / Self Care | Payer: BLUE CROSS/BLUE SHIELD | Attending: Emergency Medicine | Admitting: Emergency Medicine

## 2015-01-11 DIAGNOSIS — Z8781 Personal history of (healed) traumatic fracture: Secondary | ICD-10-CM | POA: Insufficient documentation

## 2015-01-11 DIAGNOSIS — Z8639 Personal history of other endocrine, nutritional and metabolic disease: Secondary | ICD-10-CM | POA: Diagnosis not present

## 2015-01-11 DIAGNOSIS — Z79899 Other long term (current) drug therapy: Secondary | ICD-10-CM | POA: Insufficient documentation

## 2015-01-11 DIAGNOSIS — G43909 Migraine, unspecified, not intractable, without status migrainosus: Secondary | ICD-10-CM | POA: Insufficient documentation

## 2015-01-11 DIAGNOSIS — G4730 Sleep apnea, unspecified: Secondary | ICD-10-CM | POA: Diagnosis not present

## 2015-01-11 DIAGNOSIS — F329 Major depressive disorder, single episode, unspecified: Secondary | ICD-10-CM | POA: Diagnosis not present

## 2015-01-11 DIAGNOSIS — Z8709 Personal history of other diseases of the respiratory system: Secondary | ICD-10-CM | POA: Insufficient documentation

## 2015-01-11 DIAGNOSIS — R040 Epistaxis: Secondary | ICD-10-CM | POA: Insufficient documentation

## 2015-01-11 DIAGNOSIS — Z8719 Personal history of other diseases of the digestive system: Secondary | ICD-10-CM | POA: Insufficient documentation

## 2015-01-11 DIAGNOSIS — F419 Anxiety disorder, unspecified: Secondary | ICD-10-CM | POA: Diagnosis not present

## 2015-01-11 DIAGNOSIS — Z87440 Personal history of urinary (tract) infections: Secondary | ICD-10-CM | POA: Diagnosis not present

## 2015-01-11 DIAGNOSIS — Z87891 Personal history of nicotine dependence: Secondary | ICD-10-CM | POA: Insufficient documentation

## 2015-01-11 DIAGNOSIS — M1990 Unspecified osteoarthritis, unspecified site: Secondary | ICD-10-CM | POA: Diagnosis not present

## 2015-01-11 MED ORDER — ONDANSETRON 4 MG PO TBDP
4.0000 mg | ORAL_TABLET | Freq: Once | ORAL | Status: AC | PRN
  Administered 2015-01-11: 4 mg via ORAL
  Filled 2015-01-11: qty 1

## 2015-01-11 NOTE — ED Notes (Signed)
Bed: WTR7 Expected date:  Expected time:  Means of arrival:  Comments: 

## 2015-01-11 NOTE — ED Notes (Addendum)
Pt from home c/o a bleeding around the rhino rocket in the left nare. Pt reports that she sneezed right before bed and woke up to nose bleeding again. Rhino Rocket placed on 8/18. Pt reports she was told she had a ruptured blood vessel.  She has an appointment with ENT on Tuesday. Pt coughing and spitting up blood in triage.

## 2015-01-11 NOTE — ED Notes (Signed)
Pt reporting nausea

## 2015-01-11 NOTE — ED Provider Notes (Signed)
CSN: [id number]     Arrival date & time 01/11/15  0350 History   First MD Initiated Contact with Patient 01/11/15 [phone]     Chief Complaint  Patient presents with  . Epistaxis     (Consider location/radiation/quality/duration/timing/severity/associated sxs/prior Treatment) HPI Comments: The patient is a 39 year old female, she has a history of deviated septum surgery approximately one month ago, had onset of nosebleed on the left on August 17, it resolved spontaneously, on August 18 she was seen in the emergency department and had a Rhino Rocket placed, this has done well with controlling bleeding, a very small drip out the front of the nose however last night she developed posterior epistaxis, she was coughing up blood, for approximately 2 hours she had ongoing bleeding however by arrival to the emergency department is improved and at this time the bleeding has stopped. She is supposed to see her ear nose and throat specialist on Tuesday, and at this time she denies nausea vomiting diarrhea or headache.  Patient is a 39 y.o. female presenting with nosebleeds. The history is provided by the patient and medical records.  Epistaxis   Past Medical History  Diagnosis Date  . Depression   . Ovarian failure   . Anxiety   . UTI (lower urinary tract infection)   . Sleep apnea     cpap  . Shortness of breath     exertion from crutches  . GERD (gastroesophageal reflux disease)   . Closed fracture of right distal fibula 11/19/2013  . Chronic sinus infection   . Complication of anesthesia     pt. states difficult to wake up  . History of bronchitis   . Headache     pt. states random migraines  . Wears glasses   . Arthritis     pt. states in knees  . High cholesterol    Past Surgical History  Procedure Laterality Date  . Cholecystectomy  2010  . Cholecystectomy    . Wisdom tooth extraction    . Orif fibula fracture Right 11/19/2013    Procedure: OPEN REDUCTION INTERNAL FIXATION (ORIF)  RIGHT FIBULA FRACTURE;  Surgeon: Eulas Post, MD;  Location: [facility] OR;  Service: Orthopedics;  Laterality: Right;  . Esophagogastroduodenoscopy    . Nasal septoplasty w/ turbinoplasty Bilateral 12/11/2014    Procedure: NASAL SEPTOPLASTY AND BILATERAL INFERIOR TURBINATE REDUCTION;  Surgeon: Osborn Coho, MD;  Location: [facility] OR;  Service: ENT;  Laterality: Bilateral;   Family History  Problem Relation Age of Onset  . Allergies Sister   . Heart disease Paternal Grandmother   . Cancer Paternal Grandmother     BREAST CANCER  . Cancer Paternal Grandfather    Social History  Substance Use Topics  . Smoking status: Former Smoker    Types: Cigarettes    Quit date: 05/24/2010  . Smokeless tobacco: Never Used     Comment: Pt. states she used to be a social smoker  . Alcohol Use: Yes     Comment: occasionally   OB History    No data available     Review of Systems  HENT: Positive for nosebleeds.   All other systems reviewed and are negative.     Allergies  Sulfa antibiotics  Home Medications   Prior to Admission medications   Medication Sig Start Date End Date Taking? Authorizing Provider  acetaminophen (TYLENOL) 500 MG tablet Take 1,000 mg by mouth every 6 (six) hours as needed for mild pain.   Yes Historical Provider, MD  amoxicillin-clavulanate (AUGMENTIN) 500-125 MG per tablet Take 1 tablet (500 mg total) by mouth 2 (two) times daily. Patient taking differently: Take 1 tablet by mouth 2 (two) times daily. For 10 days 12/11/14  Yes Osborn Coho, MD  buPROPion (WELLBUTRIN XL) 150 MG 24 hr tablet Take 150 mg by mouth daily.   Yes Historical Provider, MD  cetirizine (ZYRTEC) 10 MG tablet Take 10 mg by mouth at bedtime.    Yes Historical Provider, MD  HYDROcodone-homatropine (HYCODAN) 5-1.5 MG/5ML syrup Take 5 mLs by mouth every 6 (six) hours as needed for cough. 01/06/15  Yes Mercedes Camprubi-Soms, PA-C  sertraline (ZOLOFT) 100 MG tablet Take 100 mg by mouth daily.   Yes  Historical Provider, MD  sodium chloride (OCEAN) 0.65 % SOLN nasal spray Place 4 sprays into both nostrils every hour.   Yes Historical Provider, MD  HYDROcodone-acetaminophen (NORCO) 5-325 MG per tablet Take 1-2 tablets by mouth every 6 (six) hours as needed. Patient not taking: Reported on 01/09/2015 12/11/14   Osborn Coho, MD  ibuprofen (ADVIL,MOTRIN) 800 MG tablet Take 1 tablet (800 mg total) by mouth 3 (three) times daily. Patient not taking: Reported on 01/11/2015 10/12/14   Ladona Mow, PA-C  naproxen (NAPROSYN) 500 MG tablet Take 1 tablet (500 mg total) by mouth 2 (two) times daily as needed for mild pain, moderate pain or headache (TAKE WITH MEALS.). Patient not taking: Reported on 01/09/2015 01/06/15   Mercedes Camprubi-Soms, PA-C   BP 120/80 mmHg  Pulse 83  Temp(Src) 98.6 F (37 C) (Oral)  Resp 18  SpO2 96% Physical Exam  Constitutional: She appears well-developed and well-nourished. No distress.  HENT:  Head: Normocephalic and atraumatic.  Mouth/Throat: Oropharynx is clear and moist. No oropharyngeal exudate.  Oropharynx is clear, no blood visible, moist mucous membranes, speech is normal, right nares clear, packing in the left [place], no bleeding anteriorly  Eyes: Conjunctivae and EOM are normal. Pupils are equal, round, and reactive to light. Right eye exhibits no discharge. Left eye exhibits no discharge. No scleral icterus.  Neck: Normal range of motion. Neck supple. No JVD present. No thyromegaly present.  Cardiovascular: Normal rate, regular rhythm, normal heart sounds and intact distal pulses.  Exam reveals no gallop and no friction rub.   No murmur heard. Pulmonary/Chest: Effort normal and breath sounds normal. No respiratory distress. She has no wheezes. She has no rales.  Abdominal: Soft. Bowel sounds are normal. She exhibits no distension and no mass. There is no tenderness.  Musculoskeletal: Normal range of motion. She exhibits no edema or tenderness.  Lymphadenopathy:     She has no cervical adenopathy.  Neurological: She is alert. Coordination normal.  Skin: Skin is warm and dry. No rash noted. No erythema.  Psychiatric: She has a normal mood and affect. Her behavior is normal.  Nursing note and vitals reviewed.   ED Course  Procedures (including critical care time) Labs Review Labs Reviewed - No data to display  Imaging Review No results found. I have personally reviewed and evaluated these images and lab results as part of my medical decision-making.   MDM   Final diagnoses:  Epistaxis    Vital signs are unremarkable, bleeding has stopped, will need to have further evaluation with her ear nose and throat doctor on Tuesday, no indication for need to expedite workup, she is hemodynamically stable. She is frustrated with what she views as a delay in treatment however she is doing well and has had only had a small amount  of bleeding since onset.  The pt has normal VS, no sig bleeding, can be d/c home.  Eber Hong, MD 01/11/15 [phone]

## 2015-07-30 ENCOUNTER — Other Ambulatory Visit: Payer: Self-pay | Admitting: Obstetrics and Gynecology

## 2015-07-30 ENCOUNTER — Other Ambulatory Visit (HOSPITAL_COMMUNITY)
Admission: RE | Admit: 2015-07-30 | Discharge: 2015-07-30 | Disposition: A | Discharge status: Home / Self Care | Payer: 59 | Source: Ambulatory Visit | Attending: Obstetrics and Gynecology | Admitting: Obstetrics and Gynecology

## 2015-07-30 DIAGNOSIS — Z01419 Encounter for gynecological examination (general) (routine) without abnormal findings: Secondary | ICD-10-CM | POA: Diagnosis present

## 2015-07-30 DIAGNOSIS — Z1151 Encounter for screening for human papillomavirus (HPV): Secondary | ICD-10-CM | POA: Diagnosis present

## 2015-07-31 LAB — CYTOLOGY - PAP

## 2015-08-10 ENCOUNTER — Encounter (HOSPITAL_COMMUNITY): Payer: Self-pay | Admitting: Emergency Medicine

## 2015-08-10 ENCOUNTER — Emergency Department (HOSPITAL_COMMUNITY)
Admission: EM | Admit: 2015-08-10 | Discharge: 2015-08-10 | Disposition: A | Discharge status: Home / Self Care | Payer: 59 | Attending: Emergency Medicine | Admitting: Emergency Medicine

## 2015-08-10 DIAGNOSIS — Z79818 Long term (current) use of other agents affecting estrogen receptors and estrogen levels: Secondary | ICD-10-CM | POA: Insufficient documentation

## 2015-08-10 DIAGNOSIS — Z8639 Personal history of other endocrine, nutritional and metabolic disease: Secondary | ICD-10-CM | POA: Diagnosis not present

## 2015-08-10 DIAGNOSIS — Z87440 Personal history of urinary (tract) infections: Secondary | ICD-10-CM | POA: Diagnosis not present

## 2015-08-10 DIAGNOSIS — R531 Weakness: Secondary | ICD-10-CM | POA: Diagnosis not present

## 2015-08-10 DIAGNOSIS — R1030 Lower abdominal pain, unspecified: Secondary | ICD-10-CM | POA: Diagnosis present

## 2015-08-10 DIAGNOSIS — Z3202 Encounter for pregnancy test, result negative: Secondary | ICD-10-CM | POA: Diagnosis not present

## 2015-08-10 DIAGNOSIS — Z79899 Other long term (current) drug therapy: Secondary | ICD-10-CM | POA: Diagnosis not present

## 2015-08-10 DIAGNOSIS — K219 Gastro-esophageal reflux disease without esophagitis: Secondary | ICD-10-CM | POA: Insufficient documentation

## 2015-08-10 DIAGNOSIS — K5900 Constipation, unspecified: Secondary | ICD-10-CM | POA: Diagnosis not present

## 2015-08-10 DIAGNOSIS — Z8781 Personal history of (healed) traumatic fracture: Secondary | ICD-10-CM | POA: Diagnosis not present

## 2015-08-10 DIAGNOSIS — M170 Bilateral primary osteoarthritis of knee: Secondary | ICD-10-CM | POA: Diagnosis not present

## 2015-08-10 DIAGNOSIS — Z9049 Acquired absence of other specified parts of digestive tract: Secondary | ICD-10-CM | POA: Diagnosis not present

## 2015-08-10 DIAGNOSIS — Z8742 Personal history of other diseases of the female genital tract: Secondary | ICD-10-CM | POA: Insufficient documentation

## 2015-08-10 DIAGNOSIS — Z9981 Dependence on supplemental oxygen: Secondary | ICD-10-CM | POA: Diagnosis not present

## 2015-08-10 DIAGNOSIS — Z87891 Personal history of nicotine dependence: Secondary | ICD-10-CM | POA: Insufficient documentation

## 2015-08-10 DIAGNOSIS — G4730 Sleep apnea, unspecified: Secondary | ICD-10-CM | POA: Insufficient documentation

## 2015-08-10 DIAGNOSIS — Z8659 Personal history of other mental and behavioral disorders: Secondary | ICD-10-CM | POA: Diagnosis not present

## 2015-08-10 DIAGNOSIS — Z8709 Personal history of other diseases of the respiratory system: Secondary | ICD-10-CM | POA: Insufficient documentation

## 2015-08-10 LAB — COMPREHENSIVE METABOLIC PANEL
ALT: 40 U/L (ref 14–54)
ANION GAP: 9 (ref 5–15)
AST: 26 U/L (ref 15–41)
Albumin: 4 g/dL (ref 3.5–5.0)
Alkaline Phosphatase: 58 U/L (ref 38–126)
BUN: 11 mg/dL (ref 6–20)
CHLORIDE: 104 mmol/L (ref 101–111)
CO2: 24 mmol/L (ref 22–32)
CREATININE: 0.81 mg/dL (ref 0.44–1.00)
Calcium: 9.6 mg/dL (ref 8.9–10.3)
Glucose, Bld: 94 mg/dL (ref 65–99)
POTASSIUM: 4.5 mmol/L (ref 3.5–5.1)
SODIUM: 137 mmol/L (ref 135–145)
Total Bilirubin: 0.5 mg/dL (ref 0.3–1.2)
Total Protein: 7.7 g/dL (ref 6.5–8.1)

## 2015-08-10 LAB — URINE MICROSCOPIC-ADD ON

## 2015-08-10 LAB — CBC
HEMATOCRIT: 39.9 % (ref 36.0–46.0)
HEMOGLOBIN: 12.9 g/dL (ref 12.0–15.0)
MCH: 29.5 pg (ref 26.0–34.0)
MCHC: 32.3 g/dL (ref 30.0–36.0)
MCV: 91.3 fL (ref 78.0–100.0)
PLATELETS: 319 10*3/uL (ref 150–400)
RBC: 4.37 MIL/uL (ref 3.87–5.11)
RDW: 14 % (ref 11.5–15.5)
WBC: 7.7 10*3/uL (ref 4.0–10.5)

## 2015-08-10 LAB — PREGNANCY, URINE: Preg Test, Ur: NEGATIVE

## 2015-08-10 LAB — CBG MONITORING, ED: Glucose-Capillary: 84 mg/dL (ref 65–99)

## 2015-08-10 LAB — URINALYSIS, ROUTINE W REFLEX MICROSCOPIC
Bilirubin Urine: NEGATIVE
GLUCOSE, UA: NEGATIVE mg/dL
Hgb urine dipstick: NEGATIVE
Ketones, ur: NEGATIVE mg/dL
Nitrite: NEGATIVE
PROTEIN: NEGATIVE mg/dL
SPECIFIC GRAVITY, URINE: 1.022 (ref 1.005–1.030)
pH: 6 (ref 5.0–8.0)

## 2015-08-10 LAB — LIPASE, BLOOD: LIPASE: 29 U/L (ref 11–51)

## 2015-08-10 MED ORDER — PROMETHAZINE HCL 25 MG PO TABS: 25.0000 mg | ORAL_TABLET | Freq: Four times a day (QID) | ORAL | Status: DC | PRN

## 2015-08-10 MED ORDER — SODIUM CHLORIDE 0.9 % IV BOLUS (SEPSIS)
1000.0000 mL | Freq: Once | INTRAVENOUS | Status: AC
  Administered 2015-08-10: 1000 mL via INTRAVENOUS

## 2015-08-10 MED ORDER — FLEET ENEMA 7-19 GM/118ML RE ENEM
1.0000 | ENEMA | Freq: Once | RECTAL | Status: AC
  Administered 2015-08-10: 1 via RECTAL
  Filled 2015-08-10: qty 1

## 2015-08-10 NOTE — ED Notes (Signed)
MD at bedside. 

## 2015-08-10 NOTE — ED Notes (Signed)
Patient d/c'd self care.  F/U and medications discussed.  Patient verbalized understanding. 

## 2015-08-10 NOTE — Discharge Instructions (Signed)
You can take MiraLAX or Colace available over-the-counter as needed for constipation.   Constipation, Adult Constipation is when a person has fewer than three bowel movements a week, has difficulty having a bowel movement, or has stools that are dry, hard, or larger than normal. As people grow older, constipation is more common. A low-fiber diet, not taking in enough fluids, and taking certain medicines may make constipation worse.  CAUSES   Certain medicines, such as antidepressants, pain medicine, iron supplements, antacids, and water pills.   Certain diseases, such as diabetes, irritable bowel syndrome (IBS), thyroid disease, or depression.   Not drinking enough water.   Not eating enough fiber-rich foods.   Stress or travel.   Lack of physical activity or exercise.   Ignoring the urge to have a bowel movement.   Using laxatives too much.  SIGNS AND SYMPTOMS   Having fewer than three bowel movements a week.   Straining to have a bowel movement.   Having stools that are hard, dry, or larger than normal.   Feeling full or bloated.   Pain in the lower abdomen.   Not feeling relief after having a bowel movement.  DIAGNOSIS  Your health care provider will take a medical history and perform a physical exam. Further testing may be done for severe constipation. Some tests may include:  A barium enema X-ray to examine your rectum, colon, and, sometimes, your small intestine.   A sigmoidoscopy to examine your lower colon.   A colonoscopy to examine your entire colon. TREATMENT  Treatment will depend on the severity of your constipation and what is causing it. Some dietary treatments include drinking more fluids and eating more fiber-rich foods. Lifestyle treatments may include regular exercise. If these diet and lifestyle recommendations do not help, your health care provider may recommend taking over-the-counter laxative medicines to help you have bowel  movements. Prescription medicines may be prescribed if over-the-counter medicines do not work.  HOME CARE INSTRUCTIONS   Eat foods that have a lot of fiber, such as fruits, vegetables, whole grains, and beans.  Limit foods high in fat and processed sugars, such as french fries, hamburgers, cookies, candies, and soda.   A fiber supplement may be added to your diet if you cannot get enough fiber from foods.   Drink enough fluids to keep your urine clear or pale yellow.   Exercise regularly or as directed by your health care provider.   Go to the restroom when you have the urge to go. Do not hold it.   Only take over-the-counter or prescription medicines as directed by your health care provider. Do not take other medicines for constipation without talking to your health care provider first.  SEEK IMMEDIATE MEDICAL CARE IF:   You have bright red blood in your stool.   Your constipation lasts for more than 4 days or gets worse.   You have abdominal or rectal pain.   You have thin, pencil-like stools.   You have unexplained weight loss. MAKE SURE YOU:   Understand these instructions.  Will watch your condition.  Will get help right away if you are not doing well or get worse.   This information is not intended to replace advice given to you by your health care provider. Make sure you discuss any questions you have with your health care provider.   Document Released: 02/06/2004 Document Revised: 05/31/2014 Document Reviewed: 02/19/2013 Elsevier Interactive Patient Education [employer]2016 [employer]

## 2015-08-10 NOTE — ED Notes (Addendum)
Monday had diarrhea. Thursday she stopped using the bathroom but hasn't been able to go since. Pt states now she's having left lower back pain. Says since she's stopped having BMs she's felt more gassy and tired. Denies urinary symptoms, vomiting. Has tried increasing fiber, drinking more water, and drinking coffee without any improvement.  Pt also c/o increased weakness, SOB, feeling fatigued with every day activities.

## 2015-08-10 NOTE — ED Notes (Signed)
Pt had 1 large bowel movement after enema.

## 2015-08-10 NOTE — ED Provider Notes (Signed)
CSN: [id number]     Arrival date & time 08/10/15  1427 History   First MD Initiated Contact with Patient 08/10/15 1636     Chief Complaint  Patient presents with  . Constipation  . Weakness  . Abdominal Pain     Patient is a 40 y.o. female presenting with constipation, weakness, and abdominal pain. The history is provided by the patient. No language interpreter was used.  Constipation Associated symptoms: abdominal pain   Weakness Associated symptoms include abdominal pain.  Abdominal Pain Associated symptoms: constipation    Monte FantasiaJanine L Eplin is a 40 y.o. female who presents to the Emergency Department complaining of abdominal pain.  She has a hx/o ovarian failure, IBS, cholecystectomy.  She had significant diarrhea on Monday and Tuesday this week.  Since then she has not had a bowel movement since Wednesday.  Two days ago she developed left sided abdominal pain, lower that radiates to her left flank.  She has associated nausea and low grade temperature to 99.  No dysuria.  Sxs are moderate and constant in nature.  She has tried fiber and diet changes at home without significant relief, she has not tried any medications.    Past Medical History  Diagnosis Date  . Depression   . Ovarian failure   . Anxiety   . UTI (lower urinary tract infection)   . Sleep apnea     cpap  . Shortness of breath     exertion from crutches  . GERD (gastroesophageal reflux disease)   . Closed fracture of right distal fibula 11/19/2013  . Chronic sinus infection   . Complication of anesthesia     pt. states difficult to wake up  . History of bronchitis   . Headache     pt. states random migraines  . Wears glasses   . Arthritis     pt. states in knees  . High cholesterol    Past Surgical History  Procedure Laterality Date  . Cholecystectomy  2010  . Cholecystectomy    . Wisdom tooth extraction    . Orif fibula fracture Right 11/19/2013    Procedure: OPEN REDUCTION INTERNAL FIXATION (ORIF) RIGHT  FIBULA FRACTURE;  Surgeon: Eulas PostJoshua P Landau, MD;  Location: [facility] OR;  Service: Orthopedics;  Laterality: Right;  . Esophagogastroduodenoscopy    . Nasal septoplasty w/ turbinoplasty Bilateral 12/11/2014    Procedure: NASAL SEPTOPLASTY AND BILATERAL INFERIOR TURBINATE REDUCTION;  Surgeon: Osborn Cohoavid Shoemaker, MD;  [facility];  Service: ENT;  Laterality: Bilateral;   Family History  Problem Relation Age of Onset  . Allergies Sister   . Heart disease Paternal Grandmother   . Cancer Paternal Grandmother     BREAST CANCER  . Cancer Paternal Grandfather    Social History  Substance Use Topics  . Smoking status: Former Smoker    Types: Cigarettes    Quit date: 05/24/2010  . Smokeless tobacco: Never Used     Comment: Pt. states she used to be a social smoker  . Alcohol Use: Yes     Comment: occasionally   OB History    No data available     Review of Systems  Gastrointestinal: Positive for abdominal pain and constipation.  Neurological: Positive for weakness.  All other systems reviewed and are negative.     Allergies  Sulfa antibiotics  Home Medications   Prior to Admission medications   Medication Sig Start Date End Date Taking? Authorizing Provider  acetaminophen (TYLENOL) 500 MG tablet Take 1,000  mg by mouth every 6 (six) hours as needed for mild pain.   Yes Historical Provider, MD  calcium carbonate (TITRALAC) 420 MG CHEW chewable tablet Chew 840 mg by mouth daily.   Yes Historical Provider, MD  cetirizine (ZYRTEC) 10 MG tablet Take 10 mg by mouth at bedtime.   Yes Historical Provider, MD  Norgestimate-Ethinyl Estradiol Triphasic (ORTHO TRI-CYCLEN, 28,) 0.18/0.215/0.25 MG-35 MCG tablet Take 1 tablet by mouth daily.   Yes Historical Provider, MD  promethazine (PHENERGAN) 25 MG tablet Take 1 tablet (25 mg total) by mouth every 6 (six) hours as needed for nausea or vomiting. 08/10/15   Tilden Fossa, MD   BP 129/80 mmHg  Pulse 72  Temp(Src) 98.5 F (36.9 C) (Oral)  Resp 18   SpO2 100% Physical Exam  Constitutional: She is oriented to person, place, and time. She appears well-developed and well-nourished.  HENT:  Head: Normocephalic and atraumatic.  Cardiovascular: Normal rate and regular rhythm.   No murmur heard. Pulmonary/Chest: Effort normal and breath sounds normal. No respiratory distress.  Abdominal: Soft. There is no rebound and no guarding.  Obese abdomen. Mild left lower abdominal tenderness  Musculoskeletal: She exhibits no edema or tenderness.  Neurological: She is alert and oriented to person, place, and time.  Skin: Skin is warm and dry.  Psychiatric: She has a normal mood and affect. Her behavior is normal.  Nursing note and vitals reviewed.   ED Course  Procedures (including critical care time) Labs Review Labs Reviewed  URINALYSIS, ROUTINE W REFLEX MICROSCOPIC (NOT AT [facility]) - Abnormal; Notable for the following:    APPearance HAZY (*)    Leukocytes, UA MODERATE (*)    All other components within normal limits  URINE MICROSCOPIC-ADD ON - Abnormal; Notable for the following:    Squamous Epithelial / LPF 6-30 (*)    Bacteria, UA FEW (*)    All other components within normal limits  URINE CULTURE  LIPASE, BLOOD  COMPREHENSIVE METABOLIC PANEL  CBC  PREGNANCY, URINE  CBG MONITORING, ED    Imaging Review No results found. I have personally reviewed and evaluated these images and lab results as part of my medical decision-making.   EKG Interpretation   Date/Time:  Sunday August 10 2015 15:17:17 EDT Ventricular Rate:  77 PR Interval:  142 QRS Duration: 98 QT Interval:  369 QTC Calculation: 418 R Axis:   58 Text Interpretation:  Sinus rhythm Low voltage, precordial leads Confirmed  by Lincoln Brigham [phone]) on 08/10/2015 3:22:33 PM      MDM   Final diagnoses:  Constipation, unspecified constipation type    Patient here for evaluation of constipation, left side pain, nausea for the last several days. She has a benign  abdominal examination. Presentation is not consistent with bowel obstruction, diverticular disease, appendicitis. Patient had an enema in the emergency department and had a large BM and feels improved. Discussed home care for constipation, outpatient follow-up, return precautions. UA is not consistent with UTI, cultures pending and will only treat if positive.    Tilden Fossa, MD 08/10/15 1931

## 2015-08-12 LAB — URINE CULTURE

## 2015-08-14 ENCOUNTER — Emergency Department (HOSPITAL_COMMUNITY)
Admission: EM | Admit: 2015-08-14 | Discharge: 2015-08-15 | Disposition: A | Discharge status: Home / Self Care | Payer: 59 | Attending: Emergency Medicine | Admitting: Emergency Medicine

## 2015-08-14 ENCOUNTER — Emergency Department (HOSPITAL_COMMUNITY): Payer: 59

## 2015-08-14 ENCOUNTER — Encounter (HOSPITAL_COMMUNITY): Payer: Self-pay | Admitting: Emergency Medicine

## 2015-08-14 DIAGNOSIS — K5900 Constipation, unspecified: Secondary | ICD-10-CM | POA: Diagnosis not present

## 2015-08-14 DIAGNOSIS — Z8659 Personal history of other mental and behavioral disorders: Secondary | ICD-10-CM | POA: Diagnosis not present

## 2015-08-14 DIAGNOSIS — G4730 Sleep apnea, unspecified: Secondary | ICD-10-CM | POA: Diagnosis not present

## 2015-08-14 DIAGNOSIS — Z9981 Dependence on supplemental oxygen: Secondary | ICD-10-CM | POA: Diagnosis not present

## 2015-08-14 DIAGNOSIS — Z87440 Personal history of urinary (tract) infections: Secondary | ICD-10-CM | POA: Diagnosis not present

## 2015-08-14 DIAGNOSIS — Z8639 Personal history of other endocrine, nutritional and metabolic disease: Secondary | ICD-10-CM | POA: Diagnosis not present

## 2015-08-14 DIAGNOSIS — Z973 Presence of spectacles and contact lenses: Secondary | ICD-10-CM | POA: Insufficient documentation

## 2015-08-14 DIAGNOSIS — R1084 Generalized abdominal pain: Secondary | ICD-10-CM | POA: Diagnosis present

## 2015-08-14 DIAGNOSIS — Z87891 Personal history of nicotine dependence: Secondary | ICD-10-CM | POA: Insufficient documentation

## 2015-08-14 DIAGNOSIS — Z8709 Personal history of other diseases of the respiratory system: Secondary | ICD-10-CM | POA: Diagnosis not present

## 2015-08-14 DIAGNOSIS — R1111 Vomiting without nausea: Secondary | ICD-10-CM | POA: Diagnosis not present

## 2015-08-14 DIAGNOSIS — E669 Obesity, unspecified: Secondary | ICD-10-CM | POA: Diagnosis not present

## 2015-08-14 DIAGNOSIS — Z79899 Other long term (current) drug therapy: Secondary | ICD-10-CM | POA: Insufficient documentation

## 2015-08-14 DIAGNOSIS — Z8781 Personal history of (healed) traumatic fracture: Secondary | ICD-10-CM | POA: Diagnosis not present

## 2015-08-14 DIAGNOSIS — M170 Bilateral primary osteoarthritis of knee: Secondary | ICD-10-CM | POA: Insufficient documentation

## 2015-08-14 LAB — URINE MICROSCOPIC-ADD ON

## 2015-08-14 LAB — URINALYSIS, ROUTINE W REFLEX MICROSCOPIC
BILIRUBIN URINE: NEGATIVE
Glucose, UA: NEGATIVE mg/dL
Hgb urine dipstick: NEGATIVE
KETONES UR: NEGATIVE mg/dL
NITRITE: NEGATIVE
Protein, ur: NEGATIVE mg/dL
Specific Gravity, Urine: 1.009 (ref 1.005–1.030)
pH: 5.5 (ref 5.0–8.0)

## 2015-08-14 LAB — CBC WITH DIFFERENTIAL/PLATELET
BASOS ABS: 0 10*3/uL (ref 0.0–0.1)
Basophils Relative: 0 %
EOS PCT: 1 %
Eosinophils Absolute: 0.1 10*3/uL (ref 0.0–0.7)
HEMATOCRIT: 40.2 % (ref 36.0–46.0)
HEMOGLOBIN: 13.7 g/dL (ref 12.0–15.0)
LYMPHS PCT: 31 %
Lymphs Abs: 2.8 10*3/uL (ref 0.7–4.0)
MCH: 30.3 pg (ref 26.0–34.0)
MCHC: 34.1 g/dL (ref 30.0–36.0)
MCV: 88.9 fL (ref 78.0–100.0)
MONOS PCT: 8 %
Monocytes Absolute: 0.7 10*3/uL (ref 0.1–1.0)
NEUTROS ABS: 5.4 10*3/uL (ref 1.7–7.7)
Neutrophils Relative %: 60 %
Platelets: ADEQUATE 10*3/uL (ref 150–400)
RBC: 4.52 MIL/uL (ref 3.87–5.11)
RDW: 14 % (ref 11.5–15.5)
WBC: 9 10*3/uL (ref 4.0–10.5)

## 2015-08-14 LAB — COMPREHENSIVE METABOLIC PANEL
ALBUMIN: 4.4 g/dL (ref 3.5–5.0)
ALK PHOS: 58 U/L (ref 38–126)
ALT: 102 U/L — ABNORMAL HIGH (ref 14–54)
ANION GAP: 11 (ref 5–15)
AST: 80 U/L — AB (ref 15–41)
BILIRUBIN TOTAL: 0.4 mg/dL (ref 0.3–1.2)
BUN: 9 mg/dL (ref 6–20)
CALCIUM: 9.1 mg/dL (ref 8.9–10.3)
CO2: 20 mmol/L — ABNORMAL LOW (ref 22–32)
Chloride: 104 mmol/L (ref 101–111)
Creatinine, Ser: 0.85 mg/dL (ref 0.44–1.00)
GFR calc Af Amer: >60 mL/min (ref >60)
GLUCOSE: 89 mg/dL (ref 65–99)
Potassium: 4.2 mmol/L (ref 3.5–5.1)
Sodium: 135 mmol/L (ref 135–145)
TOTAL PROTEIN: 8.1 g/dL (ref 6.5–8.1)

## 2015-08-14 LAB — LIPASE, BLOOD: Lipase: 30 U/L (ref 11–51)

## 2015-08-14 MED ORDER — FLEET ENEMA 7-19 GM/118ML RE ENEM
1.0000 | ENEMA | Freq: Once | RECTAL | Status: AC
  Administered 2015-08-15: 1 via RECTAL
  Filled 2015-08-14: qty 1

## 2015-08-14 MED ORDER — MAGNESIUM CITRATE PO SOLN
1.0000 | Freq: Once | ORAL | Status: AC
  Administered 2015-08-14: 1 via ORAL
  Filled 2015-08-14: qty 296

## 2015-08-14 NOTE — ED Provider Notes (Signed)
CSN: [id number]     Arrival date & time 08/14/15  1817 History   First MD Initiated Contact with Patient 08/14/15 2016     Chief Complaint  Patient presents with  . Constipation  . Abdominal Pain   HPI  Catherine Koch is a 40 year old female with PMHx of ovarian failure, depression, anxiety, obesity and IBS presenting with constipation and abdominal pain. She was seen in the emergency department 4 days ago for the same complaint. She states that she normally has bowel movements every day. In the emergency department, she received an enema and mag citrate which she states resulted in a good bowel movement. She followed up with her primary care provider the next day and received an abdominal CT scan which showed "diarrhea type illness". She was started on Cipro for presumptive diverticulitis prior to this. She does not have a bowel movement in the past 3 days and her abdominal pain has returned. She describes it as generalized and cramping. She has tried a home enema which did not produce a bowel movement. She is also complaining of nausea without vomiting. She states that she has had issues with constipation since starting a new birth control approximately one month ago. She has tried fiber, diet changes and exercise without relief. She denies fevers or chills, headache, dizziness, syncope, URI symptoms, vomiting, back pain, dysuria or vaginal discharge.  CT scan from PCP IMPRESSION:  Fluid contents throughout the large bowel which would suggest  diarrhea type illness. Mild fecal retention.  Past Medical History  Diagnosis Date  . Depression   . Ovarian failure   . Anxiety   . UTI (lower urinary tract infection)   . Sleep apnea     cpap  . Shortness of breath     exertion from crutches  . GERD (gastroesophageal reflux disease)   . Closed fracture of right distal fibula 11/19/2013  . Chronic sinus infection   . Complication of anesthesia     pt. states difficult to wake up  . History of  bronchitis   . Headache     pt. states random migraines  . Wears glasses   . Arthritis     pt. states in knees  . High cholesterol    Past Surgical History  Procedure Laterality Date  . Cholecystectomy  2010  . Cholecystectomy    . Wisdom tooth extraction    . Orif fibula fracture Right 11/19/2013    Procedure: OPEN REDUCTION INTERNAL FIXATION (ORIF) RIGHT FIBULA FRACTURE;  Surgeon: Eulas Post, MD;  Location: [facility] OR;  Service: Orthopedics;  Laterality: Right;  . Esophagogastroduodenoscopy    . Nasal septoplasty w/ turbinoplasty Bilateral 12/11/2014    Procedure: NASAL SEPTOPLASTY AND BILATERAL INFERIOR TURBINATE REDUCTION;  Surgeon: Osborn Coho, MD;  Location: [facility] OR;  Service: ENT;  Laterality: Bilateral;   Family History  Problem Relation Age of Onset  . Allergies Sister   . Heart disease Paternal Grandmother   . Cancer Paternal Grandmother     BREAST CANCER  . Cancer Paternal Grandfather    Social History  Substance Use Topics  . Smoking status: Former Smoker    Types: Cigarettes    Quit date: 05/24/2010  . Smokeless tobacco: Never Used     Comment: Pt. states she used to be a social smoker  . Alcohol Use: Yes     Comment: occasionally   OB History    No data available     Review of Systems  Gastrointestinal:  Positive for abdominal pain and constipation.  All other systems reviewed and are negative.     Allergies  Sulfa antibiotics  Home Medications   Prior to Admission medications   Medication Sig Start Date End Date Taking? Authorizing Provider  acetaminophen (TYLENOL) 500 MG tablet Take 1,000 mg by mouth every 6 (six) hours as needed for mild pain.   Yes Historical Provider, MD  cetirizine (ZYRTEC) 10 MG tablet Take 10 mg by mouth at bedtime.   Yes Historical Provider, MD  ibuprofen (ADVIL,MOTRIN) 200 MG tablet Take 400 mg by mouth every 6 (six) hours as needed for moderate pain.   Yes Historical Provider, MD  methocarbamol (ROBAXIN) 500 MG tablet  Take 500 mg by mouth 3 (three) times daily. 08/12/15  Yes Historical Provider, MD  promethazine (PHENERGAN) 25 MG tablet Take 1 tablet (25 mg total) by mouth every 6 (six) hours as needed for nausea or vomiting. Patient not taking: Reported on 08/14/2015 08/10/15   Tilden FossaElizabeth Rees, MD   BP 118/68 mmHg  Pulse 71  Temp(Src) 98.3 F (36.8 C) (Oral)  Resp 17  SpO2 99%  LMP  Physical Exam  Constitutional: She appears well-developed and well-nourished. No distress.  Nontoxic-appearing  HENT:  Head: Normocephalic and atraumatic.  Eyes: Conjunctivae are normal. Right eye exhibits no discharge. Left eye exhibits no discharge. No scleral icterus.  Neck: Normal range of motion.  Cardiovascular: Normal rate and regular rhythm.   Pulmonary/Chest: Effort normal. No respiratory distress.  Abdominal: Soft. She exhibits no distension. There is tenderness. There is no rebound and no guarding.  Obese. Mild generalized tenderness to palpation. No guarding or rigidity.  Musculoskeletal: Normal range of motion.  Neurological: She is alert. Coordination normal.  Skin: Skin is warm and dry.  Psychiatric: She has a normal mood and affect. Her behavior is normal.  Nursing note and vitals reviewed.   ED Course  Procedures (including critical care time) Labs Review Labs Reviewed  COMPREHENSIVE METABOLIC PANEL - Abnormal; Notable for the following:    CO2 20 (*)    AST 80 (*)    ALT 102 (*)    All other components within normal limits  URINALYSIS, ROUTINE W REFLEX MICROSCOPIC (NOT AT [facility]) - Abnormal; Notable for the following:    APPearance CLOUDY (*)    Leukocytes, UA SMALL (*)    All other components within normal limits  URINE MICROSCOPIC-ADD ON - Abnormal; Notable for the following:    Squamous Epithelial / LPF 6-30 (*)    Bacteria, UA RARE (*)    All other components within normal limits  CBC WITH DIFFERENTIAL/PLATELET  LIPASE, BLOOD    Imaging Review Dg Abd 1 View  08/14/2015  CLINICAL  DATA:  40 year old female with constipation and nausea and left abdominal pain EXAM: ABDOMEN - 1 VIEW COMPARISON:  CT dated 08/13/2015 FINDINGS: Right upper quadrant cholecystectomy clips. There is moderate stool throughout the colon. No evidence of bowel obstruction or free air. No radiopaque calculi identified. The bones the osseous structures appear unremarkable. IMPRESSION: No bowel obstruction. Electronically Signed   By: Elgie CollardArash  Radparvar M.D.   On: 08/14/2015 21:18   I have personally reviewed and evaluated these images and lab results as part of my medical decision-making.   EKG Interpretation None      MDM   Final diagnoses:  Constipation   40 year old female presenting with constipation. Seen by ED and PCP in past 4 days for same complaints. Outpatient abdominal CT positive for "dirrheal  illness". Afebrile and nontoxic appearing. Abdomen is soft with mild generalized tenderness with peritoneal signs. No WBC elevation. LFT elevation which is could be consistent with viral illness; discussed with pt that she will need to have this followed up outpatient to ensure resolution. Abd DG negative for obstruction; shows moderate stool burden. Treated with mag citrate and enema in ED with bowel movement. Pt reports improved symptoms after this. Discussed outpatient treatment of constipation with OTC medications including fiber and miralax. Pt is to follow up with her PCP if constipation persists. Return precautions given in discharge paperwork and discussed with pt at bedside. Pt stable for discharge     Alveta Heimlich, PA-C 08/15/15 0021  Laurence Spates, MD 08/16/15 [phone]

## 2015-08-14 NOTE — ED Notes (Signed)
Pt still having constipation, was recently seen here for same. Abdominal pain in generalized abdomin. States she's been taking antibiotics for an "infection in her diarrhea."

## 2015-08-15 NOTE — Discharge Instructions (Signed)
Take 6 capfuls of miralax in a 32 oz gatorade and drink the whole beverage followed by 3 capfuls twice a day for the the next week if constipation does not improve. Add stool softener and fiber to your diet. Follow up with PCP if your symptoms do not improve. Keep your scheduled gastroenterology appointment. Return to ED with new, worsening or concerning symptoms.    Constipation, Adult Constipation is when a person has fewer than three bowel movements a week, has difficulty having a bowel movement, or has stools that are dry, hard, or larger than normal. As people grow older, constipation is more common. A low-fiber diet, not taking in enough fluids, and taking certain medicines may make constipation worse.  CAUSES   Certain medicines, such as antidepressants, pain medicine, iron supplements, antacids, and water pills.   Certain diseases, such as diabetes, irritable bowel syndrome (IBS), thyroid disease, or depression.   Not drinking enough water.   Not eating enough fiber-rich foods.   Stress or travel.   Lack of physical activity or exercise.   Ignoring the urge to have a bowel movement.   Using laxatives too much.  SIGNS AND SYMPTOMS   Having fewer than three bowel movements a week.   Straining to have a bowel movement.   Having stools that are hard, dry, or larger than normal.   Feeling full or bloated.   Pain in the lower abdomen.   Not feeling relief after having a bowel movement.  DIAGNOSIS  Your health care provider will take a medical history and perform a physical exam. Further testing may be done for severe constipation. Some tests may include:  A barium enema X-ray to examine your rectum, colon, and, sometimes, your small intestine.   A sigmoidoscopy to examine your lower colon.   A colonoscopy to examine your entire colon. TREATMENT  Treatment will depend on the severity of your constipation and what is causing it. Some dietary treatments  include drinking more fluids and eating more fiber-rich foods. Lifestyle treatments may include regular exercise. If these diet and lifestyle recommendations do not help, your health care provider may recommend taking over-the-counter laxative medicines to help you have bowel movements. Prescription medicines may be prescribed if over-the-counter medicines do not work.  HOME CARE INSTRUCTIONS   Eat foods that have a lot of fiber, such as fruits, vegetables, whole grains, and beans.  Limit foods high in fat and processed sugars, such as french fries, hamburgers, cookies, candies, and soda.   A fiber supplement may be added to your diet if you cannot get enough fiber from foods.   Drink enough fluids to keep your urine clear or pale yellow.   Exercise regularly or as directed by your health care provider.   Go to the restroom when you have the urge to go. Do not hold it.   Only take over-the-counter or prescription medicines as directed by your health care provider. Do not take other medicines for constipation without talking to your health care provider first.  SEEK IMMEDIATE MEDICAL CARE IF:   You have bright red blood in your stool.   Your constipation lasts for more than 4 days or gets worse.   You have abdominal or rectal pain.   You have thin, pencil-like stools.   You have unexplained weight loss. MAKE SURE YOU:   Understand these instructions.  Will watch your condition.  Will get help right away if you are not doing well or get worse.   This  information is not intended to replace advice given to you by your health care provider. Make sure you discuss any questions you have with your health care provider.   Document Released: 02/06/2004 Document Revised: 05/31/2014 Document Reviewed: 02/19/2013 Elsevier Interactive Patient Education 2016 Else[employer] Inc.  High-Fiber Diet Fiber, also called dietary fiber, is a type of carbohydrate found in fruits, vegetables,  whole grains, and beans. A high-fiber diet can have many health benefits. Your health care provider may recommend a high-fiber diet to help:  Prevent constipation. Fiber can make your bowel movements more regular.  Lower your cholesterol.  Relieve hemorrhoids, uncomplicated diverticulosis, or irritable bowel syndrome.  Prevent overeating as part of a weight-loss plan.  Prevent heart disease, type 2 diabetes, and certain cancers. WHAT IS MY PLAN? The recommended daily intake of fiber includes:  38 grams for men under age 38.  30 grams for men over age 38.  25 grams for women under age 38.  21 grams for women over age 38. You can get the recommended daily intake of dietary fiber by eating a variety of fruits, vegetables, grains, and beans. Your health care provider may also recommend a fiber supplement if it is not possible to get enough fiber through your diet. WHAT DO I NEED TO KNOW ABOUT A HIGH-FIBER DIET?  Fiber supplements have not been widely studied for their effectiveness, so it is better to get fiber through food sources.  Always check the fiber content on thenutrition facts label of any prepackaged food. Look for foods that contain at least 5 grams of fiber per serving.  Ask your dietitian if you have questions about specific foods that are related to your condition, especially if those foods are not listed in the following section.  Increase your daily fiber consumption gradually. Increasing your intake of dietary fiber too quickly may cause bloating, cramping, or gas.  Drink plenty of water. Water helps you to digest fiber. WHAT FOODS CAN I EAT? Grains Whole-grain breads. Multigrain cereal. Oats and oatmeal. Brown rice. Barley. Bulgur wheat. Millet. Bran muffins. Popcorn. Rye wafer crackers. Vegetables Sweet potatoes. Spinach. Kale. Artichokes. Cabbage. Broccoli. Green peas. Carrots. Squash. Fruits Berries. Pears. Apples. Oranges. Avocados. Prunes and raisins. Dried  figs. Meats and Other Protein Sources Navy, kidney, pinto, and soy beans. Split peas. Lentils. Nuts and seeds. Dairy Fiber-fortified yogurt. Beverages Fiber-fortified soy milk. Fiber-fortified orange juice. Other Fiber bars. The items listed above may not be a complete list of recommended foods or beverages. Contact your dietitian for more options. WHAT FOODS ARE NOT RECOMMENDED? Grains White bread. Pasta made with refined flour. White rice. Vegetables Fried potatoes. Canned vegetables. Well-cooked vegetables.  Fruits Fruit juice. Cooked, strained fruit. Meats and Other Protein Sources Fatty cuts of meat. Fried Environmental education officerpoultry or fried fish. Dairy Milk. Yogurt. Cream cheese. Sour cream. Beverages Soft drinks. Other Cakes and pastries. Butter and oils. The items listed above may not be a complete list of foods and beverages to avoid. Contact your dietitian for more information. WHAT ARE SOME TIPS FOR INCLUDING HIGH-FIBER FOODS IN MY DIET?  Eat a wide variety of high-fiber foods.  Make sure that half of all grains consumed each day are whole grains.  Replace breads and cereals made from refined flour or white flour with whole-grain breads and cereals.  Replace white rice with brown rice, bulgur wheat, or millet.  Start the day with a breakfast that is high in fiber, such as a cereal that contains at least 5 grams of fiber  per serving.  Use beans in place of meat in soups, salads, or pasta.  Eat high-fiber snacks, such as berries, raw vegetables, nuts, or popcorn.   This information is not intended to replace advice given to you by your health care provider. Make sure you discuss any questions you have with your health care provider.   Document Released: 05/10/2005 Document Revised: 05/31/2014 Document Reviewed: 10/23/2013 Elsevier Interactive Patient Education [employer]

## 2015-08-15 NOTE — ED Notes (Signed)
Bedside commode placed in room for patient.

## 2015-09-06 ENCOUNTER — Encounter (HOSPITAL_COMMUNITY): Payer: Self-pay

## 2015-09-06 ENCOUNTER — Emergency Department (HOSPITAL_COMMUNITY)
Admission: EM | Admit: 2015-09-06 | Discharge: 2015-09-07 | Disposition: A | Discharge status: Home / Self Care | Payer: 59 | Attending: Emergency Medicine | Admitting: Emergency Medicine

## 2015-09-06 DIAGNOSIS — M170 Bilateral primary osteoarthritis of knee: Secondary | ICD-10-CM | POA: Insufficient documentation

## 2015-09-06 DIAGNOSIS — Z3202 Encounter for pregnancy test, result negative: Secondary | ICD-10-CM | POA: Diagnosis not present

## 2015-09-06 DIAGNOSIS — Z9049 Acquired absence of other specified parts of digestive tract: Secondary | ICD-10-CM | POA: Diagnosis not present

## 2015-09-06 DIAGNOSIS — K5900 Constipation, unspecified: Secondary | ICD-10-CM | POA: Insufficient documentation

## 2015-09-06 DIAGNOSIS — Z8659 Personal history of other mental and behavioral disorders: Secondary | ICD-10-CM | POA: Insufficient documentation

## 2015-09-06 DIAGNOSIS — Z8742 Personal history of other diseases of the female genital tract: Secondary | ICD-10-CM | POA: Diagnosis not present

## 2015-09-06 DIAGNOSIS — Z9981 Dependence on supplemental oxygen: Secondary | ICD-10-CM | POA: Diagnosis not present

## 2015-09-06 DIAGNOSIS — Z87440 Personal history of urinary (tract) infections: Secondary | ICD-10-CM | POA: Insufficient documentation

## 2015-09-06 DIAGNOSIS — Z79899 Other long term (current) drug therapy: Secondary | ICD-10-CM | POA: Insufficient documentation

## 2015-09-06 DIAGNOSIS — Z8781 Personal history of (healed) traumatic fracture: Secondary | ICD-10-CM | POA: Insufficient documentation

## 2015-09-06 DIAGNOSIS — R109 Unspecified abdominal pain: Secondary | ICD-10-CM | POA: Diagnosis present

## 2015-09-06 DIAGNOSIS — R197 Diarrhea, unspecified: Secondary | ICD-10-CM | POA: Diagnosis not present

## 2015-09-06 DIAGNOSIS — Z87891 Personal history of nicotine dependence: Secondary | ICD-10-CM | POA: Insufficient documentation

## 2015-09-06 DIAGNOSIS — G4730 Sleep apnea, unspecified: Secondary | ICD-10-CM | POA: Diagnosis not present

## 2015-09-06 DIAGNOSIS — R1084 Generalized abdominal pain: Secondary | ICD-10-CM | POA: Insufficient documentation

## 2015-09-06 DIAGNOSIS — G8929 Other chronic pain: Secondary | ICD-10-CM | POA: Insufficient documentation

## 2015-09-06 DIAGNOSIS — R110 Nausea: Secondary | ICD-10-CM | POA: Diagnosis not present

## 2015-09-06 DIAGNOSIS — R509 Fever, unspecified: Secondary | ICD-10-CM | POA: Insufficient documentation

## 2015-09-06 DIAGNOSIS — Z8709 Personal history of other diseases of the respiratory system: Secondary | ICD-10-CM | POA: Diagnosis not present

## 2015-09-06 LAB — URINALYSIS, ROUTINE W REFLEX MICROSCOPIC
Bilirubin Urine: NEGATIVE
Glucose, UA: NEGATIVE mg/dL
Hgb urine dipstick: NEGATIVE
Ketones, ur: NEGATIVE mg/dL
Leukocytes, UA: NEGATIVE
NITRITE: NEGATIVE
PROTEIN: NEGATIVE mg/dL
SPECIFIC GRAVITY, URINE: 1.027 (ref 1.005–1.030)
pH: 5.5 (ref 5.0–8.0)

## 2015-09-06 LAB — CBC
HCT: 38.1 % (ref 36.0–46.0)
HEMOGLOBIN: 12.6 g/dL (ref 12.0–15.0)
MCH: 29.1 pg (ref 26.0–34.0)
MCHC: 33.1 g/dL (ref 30.0–36.0)
MCV: 88 fL (ref 78.0–100.0)
Platelets: 324 10*3/uL (ref 150–400)
RBC: 4.33 MIL/uL (ref 3.87–5.11)
RDW: 13.7 % (ref 11.5–15.5)
WBC: 8.2 10*3/uL (ref 4.0–10.5)

## 2015-09-06 LAB — LIPASE, BLOOD: LIPASE: 24 U/L (ref 11–51)

## 2015-09-06 LAB — COMPREHENSIVE METABOLIC PANEL
ALBUMIN: 3.9 g/dL (ref 3.5–5.0)
ALT: 57 U/L — AB (ref 14–54)
AST: 32 U/L (ref 15–41)
Alkaline Phosphatase: 59 U/L (ref 38–126)
Anion gap: 8 (ref 5–15)
BUN: 11 mg/dL (ref 6–20)
CHLORIDE: 108 mmol/L (ref 101–111)
CO2: 24 mmol/L (ref 22–32)
CREATININE: 0.92 mg/dL (ref 0.44–1.00)
Calcium: 9.2 mg/dL (ref 8.9–10.3)
GFR calc Af Amer: >60 mL/min (ref >60)
GFR calc non Af Amer: >60 mL/min (ref >60)
GLUCOSE: 91 mg/dL (ref 65–99)
POTASSIUM: 4 mmol/L (ref 3.5–5.1)
Sodium: 140 mmol/L (ref 135–145)
TOTAL PROTEIN: 7.2 g/dL (ref 6.5–8.1)
Total Bilirubin: 0.4 mg/dL (ref 0.3–1.2)

## 2015-09-06 LAB — POC URINE PREG, ED: PREG TEST UR: NEGATIVE

## 2015-09-06 LAB — POC OCCULT BLOOD, ED: FECAL OCCULT BLD: NEGATIVE

## 2015-09-06 MED ORDER — FLEET ENEMA 7-19 GM/118ML RE ENEM: 1.0000 | ENEMA | Freq: Once | RECTAL | Status: DC

## 2015-09-06 MED ORDER — MAGNESIUM CITRATE PO SOLN
1.0000 | Freq: Once | ORAL | Status: DC
  Filled 2015-09-06: qty 296

## 2015-09-06 MED ORDER — ONDANSETRON HCL 4 MG/2ML IJ SOLN
4.0000 mg | Freq: Once | INTRAMUSCULAR | Status: AC
  Administered 2015-09-06: 4 mg via INTRAVENOUS
  Filled 2015-09-06: qty 2

## 2015-09-06 MED ORDER — SODIUM CHLORIDE 0.9 % IV BOLUS (SEPSIS)
1000.0000 mL | Freq: Once | INTRAVENOUS | Status: AC
  Administered 2015-09-06: 1000 mL via INTRAVENOUS

## 2015-09-06 NOTE — ED Provider Notes (Signed)
CSN: [id number]     Arrival date & time 09/06/15  1602 History   First MD Initiated Contact with Patient 09/06/15 1956     Chief Complaint  Patient presents with  . Nausea    HPI Comments: 40 year old female presents with acute on chronic abdominal pain. Pmhx significant for diverticulosis, IBS, ovarian failure, depression, anxiety, and obesity. She states around mid-March she started having L sided abdominal pain which radiates to her L flank. It is intermittent and cramping. Reports associated fatigue, low grade fever, chills, nausea, intermittent diarrhea and constipation. She seems to have more pain when she is constipated however her last bowel movement was today.   On review of EMR: She was seen here in the ED in mid-March and treated with an enema and Miralax with some relief. She went to her PCP after this who ordered a CT of Abdomen which showed "diverticulosis of the sigmoid colon without evidence of diverticulitis as well as fluid contents into the distal large bowel which would suggest diarrhea type illness. Bowel is otherwise normal in occluding appendix." She was put on Cipro presumably for diverticulitis because she had a low grade fever at the time.   She came back to the ED on 3/25 with the similar complaints.  Xray of abdomen showed moderate stool burden. She was treated with Mag citrate and an enema again which improved her symptoms. She went to her PCP 5 days ago who added a TSH and B12 which were normal. She was given samples of Linzess 72mcg at the time which helped provide relief as well. She takes a stool softener daily however has not taken Miralax since Thursday. She has an with GI later this month. She states her last colonoscopy was in 2010 was normal.  Has had a cholecystectomy. She has tried fiber, diet changes and exercise without relief. Denies chest pain, SOB, dysuria.      Past Medical History  Diagnosis Date  . Depression   . Ovarian failure   . Anxiety   .  UTI (lower urinary tract infection)   . Sleep apnea     cpap  . Shortness of breath     exertion from crutches  . GERD (gastroesophageal reflux disease)   . Closed fracture of right distal fibula 11/19/2013  . Chronic sinus infection   . Complication of anesthesia     pt. states difficult to wake up  . History of bronchitis   . Headache     pt. states random migraines  . Wears glasses   . Arthritis     pt. states in knees  . High cholesterol    Past Surgical History  Procedure Laterality Date  . Cholecystectomy  2010  . Cholecystectomy    . Wisdom tooth extraction    . Orif fibula fracture Right 11/19/2013    Procedure: OPEN REDUCTION INTERNAL FIXATION (ORIF) RIGHT FIBULA FRACTURE;  Surgeon: Eulas PostJoshua P Landau, MD;  Location: [facility] OR;  Service: Orthopedics;  Laterality: Right;  . Esophagogastroduodenoscopy    . Nasal septoplasty w/ turbinoplasty Bilateral 12/11/2014    Procedure: NASAL SEPTOPLASTY AND BILATERAL INFERIOR TURBINATE REDUCTION;  Surgeon: Osborn Cohoavid Shoemaker, MD;  [facility];  Service: ENT;  Laterality: Bilateral;   Family History  Problem Relation Age of Onset  . Allergies Sister   . Heart disease Paternal Grandmother   . Cancer Paternal Grandmother     BREAST CANCER  . Cancer Paternal Grandfather    Social History  Substance Use  Topics  . Smoking status: Former Smoker    Types: Cigarettes    Quit date: 05/24/2010  . Smokeless tobacco: Never Used     Comment: Pt. states she used to be a social smoker  . Alcohol Use: Yes     Comment: occasionally   OB History    No data available     Review of Systems  Constitutional: Positive for fever and chills. Negative for appetite change.  Respiratory: Negative for shortness of breath.   Cardiovascular: Negative for chest pain.  Gastrointestinal: Positive for nausea, abdominal pain, diarrhea and constipation. Negative for vomiting and blood in stool.  Genitourinary: Negative for dysuria.  All other systems reviewed  and are negative.   Allergies  Sulfa antibiotics  Home Medications   Prior to Admission medications   Medication Sig Start Date End Date Taking? Authorizing Provider  acetaminophen (TYLENOL) 500 MG tablet Take 1,000 mg by mouth every 6 (six) hours as needed for mild pain.    Historical Provider, MD  cetirizine (ZYRTEC) 10 MG tablet Take 10 mg by mouth at bedtime.    Historical Provider, MD  ibuprofen (ADVIL,MOTRIN) 200 MG tablet Take 400 mg by mouth every 6 (six) hours as needed for moderate pain.    Historical Provider, MD  methocarbamol (ROBAXIN) 500 MG tablet Take 500 mg by mouth 3 (three) times daily. 08/12/15   Historical Provider, MD  promethazine (PHENERGAN) 25 MG tablet Take 1 tablet (25 mg total) by mouth every 6 (six) hours as needed for nausea or vomiting. Patient not taking: Reported on 08/14/2015 08/10/15   Tilden Fossa, MD   BP 140/85 mmHg  Pulse 61  Temp(Src) 98.1 F (36.7 C) (Oral)  Resp 18  SpO2 98%   Physical Exam  Constitutional: She is oriented to person, place, and time. She appears well-developed and well-nourished. No distress.  Morbidly obese  HENT:  Head: Normocephalic and atraumatic.  Eyes: Conjunctivae are normal. Pupils are equal, round, and reactive to light. Right eye exhibits no discharge. Left eye exhibits no discharge. No scleral icterus.  Neck: Normal range of motion.  Cardiovascular: Normal rate and regular rhythm.  Exam reveals no gallop and no friction rub.   No murmur heard. Pulmonary/Chest: Effort normal. No respiratory distress. She has no wheezes. She has no rales. She exhibits no tenderness.  Abdominal: Soft. Bowel sounds are normal. She exhibits no distension and no mass. There is tenderness. There is CVA tenderness. There is no rebound and no guarding.  Diffuse tenderness with significant tenderness over the LLQ. CVA tenderness on the L side  Genitourinary:  Rectal exam: Negative without mass, lesions or tenderness. No blood  visualized.   Neurological: She is alert and oriented to person, place, and time.  Skin: Skin is warm and dry.  Psychiatric: She has a normal mood and affect.    ED Course  Procedures (including critical care time) Labs Review Labs Reviewed  COMPREHENSIVE METABOLIC PANEL - Abnormal; Notable for the following:    ALT 57 (*)    All other components within normal limits  LIPASE, BLOOD  CBC  URINALYSIS, ROUTINE W REFLEX MICROSCOPIC (NOT AT [facility])  POC URINE PREG, ED  POC OCCULT BLOOD, ED    Imaging Review No results found. I have personally reviewed and evaluated these images and lab results as part of my medical decision-making.   EKG Interpretation None      MDM   Final diagnoses:  Abdominal pain, unspecified abdominal location  Nausea  40 year old female who presents with L sided abdominal pain. Her abdomen is soft and her tenderness is relatively mild, no rebound tenderness. Rectal exam was negative. Discussed with patient that her symptoms. She is non-toxic, NAD, asking to eat. Her symptoms are exactly the same as in the past and she's already been worked up extensively. CT of abdomen a couple weeks ago was negative. Pt has an appt with GI.   Labs were unremarkable, UA was clean. Since she has not been taking Miralax daily, advised to take it once a day until she is able to see GI so she does not become constipated again. Also recommended home enema tx. Will not obtain xray of abdomen at this time since her symptoms are unchanged.  Pt is to follow up with her PCP if constipation persists. Return precautions given.  Stable for discharge     Bethel Born, PA-C 09/07/15 1637  Derwood Kaplan, MD 09/09/15 [phone]

## 2015-09-06 NOTE — ED Notes (Signed)
Pt presents with c/o nausea, left side pain. Pt reports she has had some issues with constipation but has been able to have a bowel movement. Pt reports the pain is around her left rib cage as well as some back pain.

## 2015-09-06 NOTE — Discharge Instructions (Signed)

## 2015-09-07 NOTE — ED Notes (Signed)
Pt reports understanding of discharge information. No questions at time of discharge 

## 2015-09-15 ENCOUNTER — Other Ambulatory Visit: Payer: Self-pay | Admitting: Gastroenterology

## 2015-09-15 DIAGNOSIS — K5900 Constipation, unspecified: Secondary | ICD-10-CM

## 2015-09-19 ENCOUNTER — Ambulatory Visit
Admission: RE | Admit: 2015-09-19 | Discharge: 2015-09-19 | Disposition: A | Discharge status: Home / Self Care | Payer: 59 | Source: Ambulatory Visit | Attending: Gastroenterology | Admitting: Gastroenterology

## 2015-09-19 DIAGNOSIS — K5900 Constipation, unspecified: Secondary | ICD-10-CM

## 2016-01-09 ENCOUNTER — Encounter: Payer: Self-pay | Admitting: Internal Medicine

## 2016-01-09 ENCOUNTER — Ambulatory Visit (INDEPENDENT_AMBULATORY_CARE_PROVIDER_SITE_OTHER): Payer: Self-pay | Admitting: Internal Medicine

## 2016-01-09 VITALS — BP 110/78 | HR 61 | Ht 68.0 in | Wt 317.4 lb

## 2016-01-09 DIAGNOSIS — G4733 Obstructive sleep apnea (adult) (pediatric): Secondary | ICD-10-CM

## 2016-01-09 DIAGNOSIS — J342 Deviated nasal septum: Secondary | ICD-10-CM

## 2016-01-09 NOTE — Assessment & Plan Note (Signed)
No download available but she describes pressure is comfortable. We spent extra time discussing mask fit, leaks and expectations. Importance of regular sleep schedule and good sleep hygiene reinforced. Plan-continue CPAP 10. She can gets supplies on line for now but can go back to [facility] if needed.
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
## 2016-01-09 NOTE — Patient Instructions (Addendum)
We can continue CPAP 10/ online, with mask of choice, humidifier, supplies, AirView    Dx OSA  Please call as needed

## 2016-01-09 NOTE — Assessment & Plan Note (Signed)
Breathing at night improved after septoplasty. She has some postoperative epistaxis which has resolved.

## 2016-05-13 ENCOUNTER — Encounter (HOSPITAL_COMMUNITY): Payer: Self-pay | Admitting: Emergency Medicine

## 2016-05-13 ENCOUNTER — Emergency Department (HOSPITAL_COMMUNITY)
Admission: EM | Admit: 2016-05-13 | Discharge: 2016-05-14 | Disposition: A | Discharge status: Home / Self Care | Payer: Self-pay | Attending: Emergency Medicine | Admitting: Emergency Medicine

## 2016-05-13 DIAGNOSIS — J029 Acute pharyngitis, unspecified: Secondary | ICD-10-CM | POA: Insufficient documentation

## 2016-05-13 DIAGNOSIS — R059 Cough, unspecified: Secondary | ICD-10-CM

## 2016-05-13 DIAGNOSIS — Z87891 Personal history of nicotine dependence: Secondary | ICD-10-CM | POA: Insufficient documentation

## 2016-05-13 DIAGNOSIS — R509 Fever, unspecified: Secondary | ICD-10-CM | POA: Insufficient documentation

## 2016-05-13 DIAGNOSIS — R05 Cough: Secondary | ICD-10-CM

## 2016-05-13 DIAGNOSIS — R0981 Nasal congestion: Secondary | ICD-10-CM | POA: Insufficient documentation

## 2016-05-13 HISTORY — DX: Personal history of other diseases of the musculoskeletal system and connective tissue: Z87.39

## 2016-05-13 NOTE — ED Triage Notes (Signed)
Pt from home with complaints of sore throat, generalized body aches, fatigue, and congestion that began on Saturday. Pt states she has been taking lemon juice, cayenne pepper, and apple cider vinegar. Pt has taken no actual medicine. Pt is not febrile and is not tachycardic. Pt states she has been coughing occasionally and has had yellow sputum.

## 2016-05-14 LAB — MONONUCLEOSIS SCREEN: MONO SCREEN: NEGATIVE

## 2016-05-14 LAB — RAPID STREP SCREEN (MED CTR MEBANE ONLY): STREPTOCOCCUS, GROUP A SCREEN (DIRECT): NEGATIVE

## 2016-05-14 MED ORDER — AMOXICILLIN 500 MG PO CAPS: 500.0000 mg | ORAL_CAPSULE | Freq: Two times a day (BID) | ORAL | 0 refills | Status: AC

## 2016-05-14 MED ORDER — BENZONATATE 100 MG PO CAPS: 100.0000 mg | ORAL_CAPSULE | Freq: Three times a day (TID) | ORAL | 0 refills | Status: DC

## 2016-05-14 MED ORDER — GUAIFENESIN 100 MG/5ML PO LIQD: 100.0000 mg | ORAL | 0 refills | Status: DC | PRN

## 2016-05-14 MED ORDER — FLUTICASONE PROPIONATE 50 MCG/ACT NA SUSP: 2.0000 | Freq: Every day | NASAL | 2 refills | Status: DC

## 2016-05-14 NOTE — Discharge Instructions (Signed)
Your strep and mono tests were negative today.  Your symptoms and physical exam findings are most consistent with a viral upper respiratory infection.  I doubt you have bronchitis or pneumonia.  Usually upper respiratory infections start improving 7-10 days since symptom onset.    Please read attached information on "sore throat", "cough" and "bronchitis".   You have been prescribe flonase, mucinex and tessalon.  These are mediations that can help nasal congestion, cough and phlegm.  Please take these as instructed.   You may take ibuprofen or tylenol for low grade fever, body aches and sore throat. Salt water gargles and warm water with honey may also alleviate sore throat.   You have been given a prescription for an antibiotic (amoxicillin). Please ONLY refill this antibiotic if your symptoms do not improve in 10 days since symptom onset.  If your symptoms start improving in the next 3-4 days, discard this prescription.    Rest and fluids are very important.   Return to the emergency department if your symptoms worsen, you develop chest pain, shortness of breath, nausea, vomiting.

## 2016-05-14 NOTE — ED Notes (Signed)
Patient d/c'd self care.  F/U and medications reviewed.  Patient verbalized understanding. 

## 2016-05-14 NOTE — ED Provider Notes (Signed)
[facility]-EMERGENCY DEPT Provider Note   CSN: [id number] Arrival date & time: 05/13/16  2250     History   Chief Complaint Chief Complaint  Patient presents with  . Nasal Congestion  . Sore Throat  . Fatigue    HPI Catherine Koch is a 40 y.o. female with pertinent pmh of obesity, sleep apnea on CPAP presents to ED with sore throat, body aches, nasal congestion, mild cough with yellow sputum, nasal discharge, low grade fevers, general malaise x 6 days.  Pt states her symptoms don't seem to be improving.  Pt has not tried any OTC cold medications but has tried lemon juice, cayenne in hot water, apple cider vinegar and EC packs.  Pt denies chest pain, shortness of breath, recent international travel, exposure to TB.   HPI  Past Medical History:  Diagnosis Date  . Anxiety   . Arthritis    pt. states in knees  . Chronic sinus infection   . Closed fracture of right distal fibula 11/19/2013  . Complication of anesthesia    pt. states difficult to wake up  . Depression   . GERD (gastroesophageal reflux disease)   . Headache    pt. states random migraines  . High cholesterol   . History of bronchitis   . History of degenerative disc disease   . Ovarian failure   . Shortness of breath    exertion from crutches  . Sleep apnea    cpap  . UTI (lower urinary tract infection)   . Wears glasses     Patient Active Problem List   Diagnosis Date Noted  . Deviated nasal septum 12/11/2014    Class: Chronic  . Closed fracture of right distal fibula 11/19/2013  . S/P ORIF (open reduction internal fixation) fracture 11/19/2013  . Obesity, morbid (HCC) 06/17/2013  . Chronic rhinitis 06/17/2013  . Acute maxillary sinusitis 06/03/2012  . Obstructive sleep apnea 01/15/2012  . Depression 01/15/2012    Past Surgical History:  Procedure Laterality Date  . CHOLECYSTECTOMY  2010  . CHOLECYSTECTOMY    . ESOPHAGOGASTRODUODENOSCOPY    . NASAL SEPTOPLASTY W/ TURBINOPLASTY Bilateral 12/11/2014    Procedure: NASAL SEPTOPLASTY AND BILATERAL INFERIOR TURBINATE REDUCTION;  Surgeon: Osborn Cohoavid Shoemaker, MD;  [facility];  Service: ENT;  Laterality: Bilateral;  . ORIF FIBULA FRACTURE Right 11/19/2013   Procedure: OPEN REDUCTION INTERNAL FIXATION (ORIF) RIGHT FIBULA FRACTURE;  Surgeon: Eulas PostJoshua P Landau, MD;  Location: [facility] OR;  Service: Orthopedics;  Laterality: Right;  . WISDOM TOOTH EXTRACTION      OB History    No data available       Home Medications    Prior to Admission medications   Medication Sig Start Date End Date Taking? Authorizing Provider  acetaminophen (TYLENOL) 500 MG tablet Take 1,000 mg by mouth every 6 (six) hours as needed for mild pain. Reported on 09/06/2015    Historical Provider, MD  amoxicillin (AMOXIL) 500 MG capsule Take 1 capsule (500 mg total) by mouth 2 (two) times daily. 05/14/16 05/24/16  Liberty Handylaudia J Gibbons, PA-C  benzonatate (TESSALON) 100 MG capsule Take 1 capsule (100 mg total) by mouth every 8 (eight) hours. 05/14/16   Liberty Handylaudia J Gibbons, PA-C  cetirizine (ZYRTEC) 10 MG tablet Take 10 mg by mouth at bedtime.    Historical Provider, MD  fluticasone (FLONASE) 50 MCG/ACT nasal spray Place 2 sprays into both nostrils daily. 05/14/16   Liberty Handylaudia J Gibbons, PA-C  guaiFENesin (ROBITUSSIN) 100 MG/5ML liquid Take 5-10 mLs (100-200  mg total) by mouth every 4 (four) hours as needed for cough. 05/14/16   Liberty Handylaudia J Gibbons, PA-C  ibuprofen (ADVIL,MOTRIN) 200 MG tablet Take 400 mg by mouth every 6 (six) hours as needed for moderate pain.    Historical Provider, MD  methocarbamol (ROBAXIN) 500 MG tablet Take 500 mg by mouth 3 (three) times daily. Reported on 09/06/2015 08/12/15   Historical Provider, MD  promethazine (PHENERGAN) 25 MG tablet Take 1 tablet (25 mg total) by mouth every 6 (six) hours as needed for nausea or vomiting. 08/10/15   Tilden FossaElizabeth Rees, MD    Family History Family History  Problem Relation Age of Onset  . Allergies Sister   . Heart disease Paternal  Grandmother   . Cancer Paternal Grandmother     BREAST CANCER  . Cancer Paternal Grandfather     Social History Social History  Substance Use Topics  . Smoking status: Former Smoker    Types: Cigarettes    Quit date: 05/24/2010  . Smokeless tobacco: Never Used     Comment: Pt. states she used to be a social smoker  . Alcohol use Yes     Comment: occasionally     Allergies   Sulfa antibiotics   Review of Systems Review of Systems  Constitutional: Positive for fever. Negative for appetite change, chills and fatigue.  HENT: Positive for congestion, postnasal drip, rhinorrhea and sore throat. Negative for ear pain, sinus pain, sinus pressure and sneezing.   Eyes: Negative for photophobia.  Respiratory: Positive for cough. Negative for choking, chest tightness and shortness of breath.   Cardiovascular: Negative for chest pain and leg swelling.  Gastrointestinal: Negative for abdominal pain, constipation, diarrhea, nausea and vomiting.  Genitourinary: Negative for difficulty urinating.  Musculoskeletal: Negative for back pain, gait problem, joint swelling, myalgias and neck pain.  Skin: Negative for rash.  Neurological: Positive for headaches. Negative for dizziness, weakness, light-headedness and numbness.  Psychiatric/Behavioral: Negative for agitation.     Physical Exam Updated Vital Signs BP 135/93 (BP Location: Left Arm)   Pulse 78   Temp 98.6 F (37 C) (Oral)   Resp 16   Ht 5\' 8"  (1.727 m)   Wt (!) 145.2 kg   SpO2 98%   BMI 48.66 kg/m   Physical Exam  Constitutional: She is oriented to person, place, and time. Vital signs are normal. She appears well-developed.  Obese female in NAD, speaking in full sentences.  HENT:  Head: Normocephalic and atraumatic.  Mouth/Throat: Oropharynx is clear and moist. No oropharyngeal exudate.  Inferior turbinates mildly edematous and erythematous without nasal discharge noted.  No sinus tenderness on percussion. Oropharynx and  tonsils pink with post nasal drip, without exudates.    Eyes: Conjunctivae and EOM are normal. Pupils are equal, round, and reactive to light.  Neck: Normal range of motion. No JVD present.  Cardiovascular: Normal rate, regular rhythm and normal heart sounds.   Pulmonary/Chest:  Effort normal, breath sounds normal.  No wheezing, rales, rhonchi.  Chest expansion symmetrical.  Abdominal: Soft. There is no tenderness.  Musculoskeletal: Normal range of motion.  Lymphadenopathy:    She has no cervical adenopathy.  Neurological: She is alert and oriented to person, place, and time.  Skin: Skin is warm and dry. Capillary refill takes less than 2 seconds. No rash noted.  Psychiatric: She has a normal mood and affect. Her behavior is normal.  Nursing note and vitals reviewed.    ED Treatments / Results  Labs (all labs ordered are  listed, but only abnormal results are displayed) Labs Reviewed  RAPID STREP SCREEN (NOT AT [facility]  CULTURE, GROUP A STREP [facility])  MONONUCLEOSIS SCREEN    EKG  EKG Interpretation None       Radiology No results found.  Procedures Procedures (including critical care time)  Medications Ordered in ED Medications - No data to display   Initial Impression / Assessment and Plan / ED Course  I have reviewed the triage vital signs and the nursing notes.  Pertinent labs & imaging results that were available during my care of the patient were reviewed by me and considered in my medical decision making (see chart for details).  Clinical Course    Patient with symptoms consistent with a viral syndrome, although could be bacterial or progress into bacterial URI. Vitals are stable, no fever. No signs of dehydration. Lung exam normal, no signs of pneumonia. Supportive therapy indicated with flonase, tessalon, mucinex, humidifier.  Given pt symptoms for 6 days, I provided patient antibiotics and instructed pt to only refill this prescription if symptoms do not improve  by day 10.  Discussed risk vs benefits of using antibiotics when not indicated.  Pt appears to be reliable and verbalized understanding.  ED return instructions given if symptoms worsen.     Final Clinical Impressions(s) / ED Diagnoses   Final diagnoses:  Sore throat  Nasal congestion  Fever, unspecified fever cause  Cough    New Prescriptions Discharge Medication List as of 05/14/2016  1:08 AM    START taking these medications   Details  amoxicillin (AMOXIL) 500 MG capsule Take 1 capsule (500 mg total) by mouth 2 (two) times daily., Starting Fri 05/14/2016, Until Mon 05/24/2016, Print    benzonatate (TESSALON) 100 MG capsule Take 1 capsule (100 mg total) by mouth every 8 (eight) hours., Starting Fri 05/14/2016, Print    fluticasone (FLONASE) 50 MCG/ACT nasal spray Place 2 sprays into both nostrils daily., Starting Fri 05/14/2016, Print    guaiFENesin (ROBITUSSIN) 100 MG/5ML liquid Take 5-10 mLs (100-200 mg total) by mouth every 4 (four) hours as needed for cough., Starting Fri 05/14/2016, Print         Liberty Handy, PA-C 05/16/16 1157    Canary Brim Tegeler, MD 05/16/16 1958

## 2016-05-16 LAB — CULTURE, GROUP A STREP (THRC)

## 2016-08-03 ENCOUNTER — Emergency Department (HOSPITAL_COMMUNITY)
Admission: EM | Admit: 2016-08-03 | Discharge: 2016-08-03 | Disposition: A | Discharge status: Home / Self Care | Payer: Self-pay | Attending: Emergency Medicine | Admitting: Emergency Medicine

## 2016-08-03 ENCOUNTER — Encounter (HOSPITAL_COMMUNITY): Payer: Self-pay | Admitting: Emergency Medicine

## 2016-08-03 ENCOUNTER — Emergency Department (HOSPITAL_COMMUNITY): Payer: Self-pay

## 2016-08-03 DIAGNOSIS — N3000 Acute cystitis without hematuria: Secondary | ICD-10-CM | POA: Insufficient documentation

## 2016-08-03 DIAGNOSIS — Z87891 Personal history of nicotine dependence: Secondary | ICD-10-CM | POA: Insufficient documentation

## 2016-08-03 DIAGNOSIS — Z79899 Other long term (current) drug therapy: Secondary | ICD-10-CM | POA: Insufficient documentation

## 2016-08-03 LAB — CBC
HCT: 40.5 % (ref 36.0–46.0)
Hemoglobin: 13.6 g/dL (ref 12.0–15.0)
MCH: 30.2 pg (ref 26.0–34.0)
MCHC: 33.6 g/dL (ref 30.0–36.0)
MCV: 89.8 fL (ref 78.0–100.0)
PLATELETS: 333 10*3/uL (ref 150–400)
RBC: 4.51 MIL/uL (ref 3.87–5.11)
RDW: 13.7 % (ref 11.5–15.5)
WBC: 12.9 10*3/uL — AB (ref 4.0–10.5)

## 2016-08-03 LAB — URINALYSIS, ROUTINE W REFLEX MICROSCOPIC
BILIRUBIN URINE: NEGATIVE
Glucose, UA: NEGATIVE mg/dL
Hgb urine dipstick: NEGATIVE
KETONES UR: NEGATIVE mg/dL
Nitrite: NEGATIVE
PH: 5 (ref 5.0–8.0)
PROTEIN: NEGATIVE mg/dL
Specific Gravity, Urine: 1.021 (ref 1.005–1.030)

## 2016-08-03 LAB — BASIC METABOLIC PANEL
Anion gap: 8 (ref 5–15)
BUN: 14 mg/dL (ref 6–20)
CALCIUM: 9.2 mg/dL (ref 8.9–10.3)
CO2: 22 mmol/L (ref 22–32)
CREATININE: 0.96 mg/dL (ref 0.44–1.00)
Chloride: 106 mmol/L (ref 101–111)
GFR calc Af Amer: >60 mL/min (ref >60)
GLUCOSE: 85 mg/dL (ref 65–99)
Potassium: 4.2 mmol/L (ref 3.5–5.1)
Sodium: 136 mmol/L (ref 135–145)

## 2016-08-03 LAB — PREGNANCY, URINE: PREG TEST UR: NEGATIVE

## 2016-08-03 MED ORDER — CEPHALEXIN 500 MG PO CAPS: 500.0000 mg | ORAL_CAPSULE | Freq: Four times a day (QID) | ORAL | 0 refills | Status: DC

## 2016-08-03 NOTE — ED Triage Notes (Signed)
Patient has list with all her complaints: Dizziness x 2 days. Cough with phlegm. Chest tightness intermittently over past week with moving.   Left side/flank pain for 2-3 weeks ago.  Patient been urinating "weird and differently".   Patient going through menopause so now taking hormones and believes that has effected urination. Patient gets sweats and chills. Reports headaches when wakes up in mornings, intermittently.

## 2016-08-03 NOTE — ED Notes (Signed)
Discharge instructions, follow up care, and rx x1 reviewed with patient. Patient verbalized understanding. 

## 2016-08-03 NOTE — ED Provider Notes (Signed)
[facility]-EMERGENCY DEPT Provider Note   CSN: [id number] Arrival date & time: 08/03/16  1358     History   Chief Complaint Chief Complaint  Patient presents with  . multiple complaints    HPI Catherine Koch is a 41 y.o. female.  HPI  The patient is a 41 year old female, she has a history of depression, acid reflux, high cholesterol, she has had sleep apnea as well as a history of what she describes as ovarian failure which has put her into early menopause. She reports multiple complaints which seem to be quite vague including a feeling of fatigue which has been quite chronic. She has had 2 days of increasing fatigue with a feeling of some nasal and chest congestion, she has had some yellow drainage and a feeling of dizziness which she describes as feeling lightheaded. She denies any vertigo. She reports that she has had some left side pain intermittently for a year and feels like there is something pushing on her back which comes intermittently. There has been some urinary frequency here as of the last couple of days where she is urinating throughout the day as well as at night frequently. She has left shoulder pain as well as neck pain, arms and legs pain diffusely. She feels that she is short of breath at times and feels that sometimes she gets hot and cold flashes from her menopause symptoms. She feels like she has a sore throat, she feels like there is a stuck feeling in her throat occasionally and has nausea and early satiety. She is also noted some rectal bleeding with a history of hemorrhoids and describes that she has some bright red blood on top of the stool and sometimes has pain with stooling. She has tried TheraFlu, tea, water, echinacea, zinc and vitamin C without improvement.  Past Medical History:  Diagnosis Date  . Anxiety   . Arthritis    pt. states in knees  . Chronic sinus infection   . Closed fracture of right distal fibula 11/19/2013  . Complication of anesthesia    pt.  states difficult to wake up  . Depression   . GERD (gastroesophageal reflux disease)   . Headache    pt. states random migraines  . High cholesterol   . History of bronchitis   . History of degenerative disc disease   . Ovarian failure   . Shortness of breath    exertion from crutches  . Sleep apnea    cpap  . UTI (lower urinary tract infection)   . Wears glasses     Patient Active Problem List   Diagnosis Date Noted  . Deviated nasal septum 12/11/2014    Class: Chronic  . Closed fracture of right distal fibula 11/19/2013  . S/P ORIF (open reduction internal fixation) fracture 11/19/2013  . Obesity, morbid (HCC) 06/17/2013  . Chronic rhinitis 06/17/2013  . Acute maxillary sinusitis 06/03/2012  . Obstructive sleep apnea 01/15/2012  . Depression 01/15/2012    Past Surgical History:  Procedure Laterality Date  . CHOLECYSTECTOMY  2010  . CHOLECYSTECTOMY    . ESOPHAGOGASTRODUODENOSCOPY    . NASAL SEPTOPLASTY W/ TURBINOPLASTY Bilateral 12/11/2014   Procedure: NASAL SEPTOPLASTY AND BILATERAL INFERIOR TURBINATE REDUCTION;  Surgeon: Osborn Coho, MD;  Location: [facility] OR;  Service: ENT;  Laterality: Bilateral;  . ORIF FIBULA FRACTURE Right 11/19/2013   Procedure: OPEN REDUCTION INTERNAL FIXATION (ORIF) RIGHT FIBULA FRACTURE;  Surgeon: Eulas Post, MD;  Location: [facility] OR;  Service: Orthopedics;  Laterality: Right;  .  WISDOM TOOTH EXTRACTION      OB History    No data available       Home Medications    Prior to Admission medications   Medication Sig Start Date End Date Taking? Authorizing Provider  Ca Phosphate-Cholecalciferol (CALCIUM 500 + D3) 250-500 MG-UNIT CHEW Chew 1 tablet by mouth daily.   Yes Historical Provider, MD  cetirizine (ZYRTEC) 10 MG tablet Take 10 mg by mouth at bedtime.   Yes Historical Provider, MD  fluticasone (FLONASE) 50 MCG/ACT nasal spray Place 2 sprays into both nostrils daily. Patient taking differently: Place 2 sprays into both nostrils daily  as needed for allergies.  05/14/16  Yes Liberty Handylaudia J Gibbons, PA-C  Misc Natural Products (ESTROVEN ENERGY) TABS Take 1 tablet by mouth daily.   Yes Historical Provider, MD  acetaminophen (TYLENOL) 500 MG tablet Take 1,000 mg by mouth every 6 (six) hours as needed for mild pain. Reported on 09/06/2015    Historical Provider, MD  cephALEXin (KEFLEX) 500 MG capsule Take 1 capsule (500 mg total) by mouth 4 (four) times daily. 08/03/16   Eber HongBrian Kamri Gotsch, MD  guaiFENesin (ROBITUSSIN) 100 MG/5ML liquid Take 5-10 mLs (100-200 mg total) by mouth every 4 (four) hours as needed for cough. 05/14/16   Liberty Handylaudia J Gibbons, PA-C  ibuprofen (ADVIL,MOTRIN) 200 MG tablet Take 400 mg by mouth every 6 (six) hours as needed for moderate pain.    Historical Provider, MD    Family History Family History  Problem Relation Age of Onset  . Allergies Sister   . Heart disease Paternal Grandmother   . Cancer Paternal Grandmother     BREAST CANCER  . Cancer Paternal Grandfather     Social History Social History  Substance Use Topics  . Smoking status: Former Smoker    Types: Cigarettes    Quit date: 05/24/2010  . Smokeless tobacco: Never Used     Comment: Pt. states she used to be a social smoker  . Alcohol use Yes     Comment: occasionally     Allergies   Sulfa antibiotics   Review of Systems Review of Systems  All other systems reviewed and are negative.    Physical Exam Updated Vital Signs BP 135/83 (BP Location: Right Arm)   Pulse 73   Temp 98.4 F (36.9 C) (Oral)   Resp 18   SpO2 100%   Physical Exam  Constitutional: She appears well-developed and well-nourished. No distress.  HENT:  Head: Normocephalic and atraumatic.  Mouth/Throat: Oropharynx is clear and moist. No oropharyngeal exudate.  Clear OP, clear nares, neck supple, no LAD and no thyromegaly  Eyes: Conjunctivae and EOM are normal. Pupils are equal, round, and reactive to light. Right eye exhibits no discharge. Left eye exhibits no  discharge. No scleral icterus.  Neck: Normal range of motion. Neck supple. No JVD present. No thyromegaly present.  Cardiovascular: Normal rate, regular rhythm, normal heart sounds and intact distal pulses.  Exam reveals no gallop and no friction rub.   No murmur heard. Pulmonary/Chest: Effort normal and breath sounds normal. No respiratory distress. She has no wheezes. She has no rales.  Abdominal: Soft. Bowel sounds are normal. She exhibits no distension and no mass. There is no tenderness.  Musculoskeletal: Normal range of motion. She exhibits no edema or tenderness.  Lymphadenopathy:    She has no cervical adenopathy.  Neurological: She is alert. Coordination normal.  Neurologic exam:  Speech clear, pupils equal round reactive to light, extraocular movements intact  Normal peripheral visual fields Cranial nerves III through XII normal including no facial droop Follows commands, moves all extremities x4, normal strength to bilateral upper and lower extremities at all major muscle groups including grip Sensation normal to light touch and pinprick Coordination intact, no limb ataxia, finger-nose-finger normal Rapid alternating movements normal No pronator drift Gait normal   Skin: Skin is warm and dry. No rash noted. No erythema.  Psychiatric: She has a normal mood and affect. Her behavior is normal.  Does not appear anxious, depressed and is no manic or psychotic.  No substance abuse  Nursing note and vitals reviewed.    ED Treatments / Results  Labs (all labs ordered are listed, but only abnormal results are displayed) Labs Reviewed  URINALYSIS, ROUTINE W REFLEX MICROSCOPIC - Abnormal; Notable for the following:       Result Value   APPearance HAZY (*)    Leukocytes, UA LARGE (*)    Bacteria, UA RARE (*)    Squamous Epithelial / LPF 0-5 (*)    All other components within normal limits  CBC - Abnormal; Notable for the following:    WBC 12.9 (*)    All other components  within normal limits  PREGNANCY, URINE  BASIC METABOLIC PANEL     Radiology Dg Chest 2 View  Result Date: 08/03/2016 CLINICAL DATA:  Productive cough, chest tightness and shortness of breath intermittently for 2 weeks, history bronchitis, sleep apnea, GERD, anxiety EXAM: CHEST  2 VIEW COMPARISON:  01/06/2015; correlation interval CT chest 02/12/2016 FINDINGS: Normal heart size, mediastinal contours, and pulmonary vascularity. Lungs clear. No pleural effusion or pneumothorax. Bones unremarkable. IMPRESSION: Normal exam. Electronically Signed   By: Ulyses Southward M.D.   On: 08/03/2016 16:19    Procedures Procedures (including critical care time)  Medications Ordered in ED Medications - No data to display   Initial Impression / Assessment and Plan / ED Course  I have reviewed the triage vital signs and the nursing notes.  Pertinent labs & imaging results that were available during my care of the patient were reviewed by me and considered in my medical decision making (see chart for details).  There is no focal findings on exam to suggest a source of her symptoms - she has some URI / sinus sx and does report a hx of nasal surgery to correct this - she appears well, she has VS as below that are unremarkable - check UA, CXR and CBC to evaluate for blood counts.  Check BMP for renal function and lytes.  Vitals:   08/03/16 1429  BP: 146/93  Pulse: 76  Resp: 19  Temp: 98.4 F (36.9 C)   UTI present - mild leukocytosis - CXR neg, BMP neg, Pt given keflex and home In agtreement -wella ppearing  Final Clinical Impressions(s) / ED Diagnoses   Final diagnoses:  Acute cystitis without hematuria    New Prescriptions New Prescriptions   CEPHALEXIN (KEFLEX) 500 MG CAPSULE    Take 1 capsule (500 mg total) by mouth 4 (four) times daily.     Eber Hong, MD 08/03/16 [phone]

## 2016-08-03 NOTE — Discharge Instructions (Signed)
Your testing showed a Urinary infection and small increase in your blood counts, you should be seen for follow up in the next 2 days if you are no better, Keflex 4 times daily for 7 days  ER if you get worsened symptoms.  Please obtain all of your results from medical records or have your doctors office obtain the results - share them with your doctor - you should be seen at your doctors office in the next 2 days. Call today to arrange your follow up. Take the medications as prescribed. Please review all of the medicines and only take them if you do not have an allergy to them. Please be aware that if you are taking birth control pills, taking other prescriptions, ESPECIALLY ANTIBIOTICS may make the birth control ineffective - if this is the case, either do not engage in sexual activity or use alternative methods of birth control such as condoms until you have finished the medicine and your family doctor says it is OK to restart them. If you are on a blood thinner such as COUMADIN, be aware that any other medicine that you take may cause the coumadin to either work too much, or not enough - you should have your coumadin level rechecked in next 7 days if this is the case.  ?  It is also a possibility that you have an allergic reaction to any of the medicines that you have been prescribed - Everybody reacts differently to medications and while MOST people have no trouble with most medicines, you may have a reaction such as nausea, vomiting, rash, swelling, shortness of breath. If this is the case, please stop taking the medicine immediately and contact your physician.  ?  You should return to the ER if you develop severe or worsening symptoms.

## 2016-08-06 ENCOUNTER — Telehealth: Payer: BLUE CROSS/BLUE SHIELD | Admitting: Nurse Practitioner

## 2016-08-06 DIAGNOSIS — N390 Urinary tract infection, site not specified: Secondary | ICD-10-CM

## 2016-08-06 NOTE — Progress Notes (Signed)
Patient was seen in the ER on 08/03/2016, but continues to have have urinary symptoms.  Patient requesting work note; patient referred back to ER.

## 2016-08-07 ENCOUNTER — Encounter (HOSPITAL_COMMUNITY): Payer: Self-pay

## 2016-08-07 ENCOUNTER — Emergency Department (HOSPITAL_COMMUNITY)
Admission: EM | Admit: 2016-08-07 | Discharge: 2016-08-08 | Disposition: A | Discharge status: Home / Self Care | Payer: Self-pay | Attending: Emergency Medicine | Admitting: Emergency Medicine

## 2016-08-07 DIAGNOSIS — Z79899 Other long term (current) drug therapy: Secondary | ICD-10-CM | POA: Insufficient documentation

## 2016-08-07 DIAGNOSIS — N3001 Acute cystitis with hematuria: Secondary | ICD-10-CM | POA: Insufficient documentation

## 2016-08-07 DIAGNOSIS — Z87891 Personal history of nicotine dependence: Secondary | ICD-10-CM | POA: Insufficient documentation

## 2016-08-07 DIAGNOSIS — E860 Dehydration: Secondary | ICD-10-CM | POA: Insufficient documentation

## 2016-08-07 LAB — URINALYSIS, ROUTINE W REFLEX MICROSCOPIC
Bacteria, UA: NONE SEEN
Bilirubin Urine: NEGATIVE
GLUCOSE, UA: NEGATIVE mg/dL
Hgb urine dipstick: NEGATIVE
Ketones, ur: NEGATIVE mg/dL
Nitrite: NEGATIVE
PH: 5 (ref 5.0–8.0)
PROTEIN: NEGATIVE mg/dL
Specific Gravity, Urine: 1.023 (ref 1.005–1.030)

## 2016-08-07 LAB — CBC WITH DIFFERENTIAL/PLATELET
BASOS ABS: 0.1 10*3/uL (ref 0.0–0.1)
BASOS PCT: 1 %
Eosinophils Absolute: 0.2 10*3/uL (ref 0.0–0.7)
Eosinophils Relative: 2 %
HEMATOCRIT: 38.3 % (ref 36.0–46.0)
Hemoglobin: 13.1 g/dL (ref 12.0–15.0)
LYMPHS PCT: 36 %
Lymphs Abs: 3.7 10*3/uL (ref 0.7–4.0)
MCH: 30 pg (ref 26.0–34.0)
MCHC: 34.2 g/dL (ref 30.0–36.0)
MCV: 87.6 fL (ref 78.0–100.0)
MONO ABS: 0.8 10*3/uL (ref 0.1–1.0)
Monocytes Relative: 7 %
NEUTROS ABS: 5.6 10*3/uL (ref 1.7–7.7)
NEUTROS PCT: 54 %
Platelets: 289 10*3/uL (ref 150–400)
RBC: 4.37 MIL/uL (ref 3.87–5.11)
RDW: 13.4 % (ref 11.5–15.5)
WBC: 10.3 10*3/uL (ref 4.0–10.5)

## 2016-08-07 LAB — I-STAT CG4 LACTIC ACID, ED: Lactic Acid, Venous: 1.42 mmol/L (ref 0.5–1.9)

## 2016-08-07 MED ORDER — CEFTRIAXONE SODIUM 1 G IJ SOLR
1.0000 g | Freq: Once | INTRAMUSCULAR | Status: AC
  Administered 2016-08-07: 1 g via INTRAVENOUS
  Filled 2016-08-07: qty 10

## 2016-08-07 MED ORDER — SODIUM CHLORIDE 0.9 % IV BOLUS (SEPSIS)
1000.0000 mL | Freq: Once | INTRAVENOUS | Status: AC
  Administered 2016-08-07: 1000 mL via INTRAVENOUS

## 2016-08-07 NOTE — ED Provider Notes (Signed)
[facility]-EMERGENCY DEPT Provider Note   CSN: [id number] Arrival date & time: 08/07/16  2132     History   Chief Complaint Chief Complaint  Patient presents with  . Urinary Tract Infection    HPI Catherine Koch is a 41 y.o. female.  HPI   41 year old female with past medical history as below who presents with generalized fatigue in the setting of recent diagnosis of UTI. Patient was recently seen and diagnosed with UTI. She was started on Keflex. Since then, she has had persistent urinary frequency and urgency. She has also had persistent left-sided flank pain,. This pain is an aching, throbbing pain that comes and goes. The pain began gradually and has not acutely worsened. She has also had nausea but no vomiting. She's been able to eat and drink but does not drink large amounts of water. She states that she did not feel good enough to return to work today so she presents to the ER for evaluation. She has not tried anything other than her antibiotics. She has no history of kidney stones. No fevers.  Past Medical History:  Diagnosis Date  . Anxiety   . Arthritis    pt. states in knees  . Chronic sinus infection   . Closed fracture of right distal fibula 11/19/2013  . Complication of anesthesia    pt. states difficult to wake up  . Depression   . GERD (gastroesophageal reflux disease)   . Headache    pt. states random migraines  . High cholesterol   . History of bronchitis   . History of degenerative disc disease   . Ovarian failure   . Shortness of breath    exertion from crutches  . Sleep apnea    cpap  . UTI (lower urinary tract infection)   . Wears glasses     Patient Active Problem List   Diagnosis Date Noted  . Deviated nasal septum 12/11/2014    Class: Chronic  . Closed fracture of right distal fibula 11/19/2013  . S/P ORIF (open reduction internal fixation) fracture 11/19/2013  . Obesity, morbid (HCC) 06/17/2013  . Chronic rhinitis 06/17/2013  . Acute  maxillary sinusitis 06/03/2012  . Obstructive sleep apnea 01/15/2012  . Depression 01/15/2012    Past Surgical History:  Procedure Laterality Date  . CHOLECYSTECTOMY  2010  . CHOLECYSTECTOMY    . ESOPHAGOGASTRODUODENOSCOPY    . NASAL SEPTOPLASTY W/ TURBINOPLASTY Bilateral 12/11/2014   Procedure: NASAL SEPTOPLASTY AND BILATERAL INFERIOR TURBINATE REDUCTION;  Surgeon: Osborn Cohoavid Shoemaker, MD;  [facility];  Service: ENT;  Laterality: Bilateral;  . ORIF FIBULA FRACTURE Right 11/19/2013   Procedure: OPEN REDUCTION INTERNAL FIXATION (ORIF) RIGHT FIBULA FRACTURE;  Surgeon: Eulas PostJoshua P Landau, MD;  Location: [facility] OR;  Service: Orthopedics;  Laterality: Right;  . WISDOM TOOTH EXTRACTION      OB History    No data available       Home Medications    Prior to Admission medications   Medication Sig Start Date End Date Taking? Authorizing Provider  acetaminophen (TYLENOL) 500 MG tablet Take 1,000 mg by mouth every 6 (six) hours as needed for mild pain. Reported on 09/06/2015   Yes Historical Provider, MD  Ca Phosphate-Cholecalciferol (CALCIUM 500 + D3) 250-500 MG-UNIT CHEW Chew 1 tablet by mouth daily.   Yes Historical Provider, MD  cephALEXin (KEFLEX) 500 MG capsule Take 1 capsule (500 mg total) by mouth 4 (four) times daily. 08/03/16  Yes Eber HongBrian Miller, MD  cetirizine (ZYRTEC) 10 MG tablet  Take 10 mg by mouth at bedtime.   Yes Historical Provider, MD  fluticasone (FLONASE) 50 MCG/ACT nasal spray Place 2 sprays into both nostrils daily. Patient taking differently: Place 2 sprays into both nostrils daily as needed for allergies.  05/14/16  Yes Liberty Handy, PA-C  ibuprofen (ADVIL,MOTRIN) 200 MG tablet Take 400 mg by mouth every 6 (six) hours as needed for moderate pain.   Yes Historical Provider, MD  Nutritional Supplements (ESTROVEN NIGHTTIME PO) Take 1 tablet by mouth at bedtime.   Yes Historical Provider, MD  guaiFENesin (ROBITUSSIN) 100 MG/5ML liquid Take 5-10 mLs (100-200 mg total) by mouth  every 4 (four) hours as needed for cough. Patient not taking: Reported on 08/07/2016 05/14/16   Liberty Handy, PA-C  HYDROcodone-acetaminophen (NORCO/VICODIN) 5-325 MG tablet Take 1-2 tablets by mouth every 4 (four) hours as needed for moderate pain or severe pain. 08/08/16   Shaune Pollack, MD  levofloxacin (LEVAQUIN) 750 MG tablet Take 1 tablet (750 mg total) by mouth daily. 08/08/16 08/13/16  Shaune Pollack, MD  naproxen (NAPROSYN) 375 MG tablet Take 1 tablet (375 mg total) by mouth 2 (two) times daily as needed for moderate pain. 08/08/16 08/15/16  Shaune Pollack, MD  ondansetron (ZOFRAN) 4 MG tablet Take 1 tablet (4 mg total) by mouth every 8 (eight) hours as needed for nausea or vomiting. 08/08/16   Shaune Pollack, MD    Family History Family History  Problem Relation Age of Onset  . Allergies Sister   . Heart disease Paternal Grandmother   . Cancer Paternal Grandmother     BREAST CANCER  . Cancer Paternal Grandfather     Social History Social History  Substance Use Topics  . Smoking status: Former Smoker    Types: Cigarettes    Quit date: 05/24/2010  . Smokeless tobacco: Never Used     Comment: Pt. states she used to be a social smoker  . Alcohol use Yes     Comment: occasionally     Allergies   Sulfa antibiotics   Review of Systems Review of Systems  Constitutional: Positive for chills and fatigue. Negative for fever.  HENT: Negative for congestion, rhinorrhea and sore throat.   Eyes: Negative for visual disturbance.  Respiratory: Negative for cough, shortness of breath and wheezing.   Cardiovascular: Negative for chest pain and leg swelling.  Gastrointestinal: Negative for abdominal pain, diarrhea, nausea and vomiting.  Genitourinary: Positive for dysuria and flank pain. Negative for vaginal bleeding and vaginal discharge.  Musculoskeletal: Negative for neck pain.  Skin: Negative for rash.  Allergic/Immunologic: Negative for immunocompromised state.  Neurological:  Positive for weakness and headaches. Negative for syncope.  Hematological: Does not bruise/bleed easily.  All other systems reviewed and are negative.    Physical Exam Updated Vital Signs BP 122/85 (BP Location: Right Arm)   Pulse 72   Temp 98 F (36.7 C) (Oral)   Resp 19   Ht 5\' 8"  (1.727 m)   Wt (!) 320 lb (145.2 kg)   SpO2 99%   BMI 48.66 kg/m   Physical Exam  Constitutional: She is oriented to person, place, and time. She appears well-developed and well-nourished. No distress.  HENT:  Head: Normocephalic and atraumatic.  Mouth/Throat: No oropharyngeal exudate.  Mildly dry MM  Eyes: Conjunctivae are normal.  Neck: Neck supple.  Cardiovascular: Normal rate, regular rhythm and normal heart sounds.  Exam reveals no friction rub.   No murmur heard. Pulmonary/Chest: Effort normal and breath sounds normal. No respiratory  distress. She has no wheezes. She has no rales.  Abdominal: Soft. Bowel sounds are normal. She exhibits no distension. There is tenderness. There is no rebound and no guarding.  Minimal suprapubic TTP. She has TTP over LUQ but no overt CVAT.   Musculoskeletal: She exhibits no edema.  Neurological: She is alert and oriented to person, place, and time. She exhibits normal muscle tone.  Skin: Skin is warm. Capillary refill takes less than 2 seconds.  Psychiatric: She has a normal mood and affect.  Nursing note and vitals reviewed.    ED Treatments / Results  Labs (all labs ordered are listed, but only abnormal results are displayed) Labs Reviewed  URINALYSIS, ROUTINE W REFLEX MICROSCOPIC - Abnormal; Notable for the following:       Result Value   APPearance HAZY (*)    Leukocytes, UA MODERATE (*)    Squamous Epithelial / LPF 0-5 (*)    All other components within normal limits  COMPREHENSIVE METABOLIC PANEL - Abnormal; Notable for the following:    CO2 21 (*)    Creatinine, Ser 1.03 (*)    Total Protein 8.5 (*)    All other components within normal  limits  URINE CULTURE  CBC WITH DIFFERENTIAL/PLATELET  I-STAT CG4 LACTIC ACID, ED    EKG  EKG Interpretation None       Radiology No results found.  Procedures Procedures (including critical care time)  Medications Ordered in ED Medications  sodium chloride 0.9 % bolus 1,000 mL (0 mLs Intravenous Stopped 08/08/16 0313)  cefTRIAXone (ROCEPHIN) 1 g in dextrose 5 % 50 mL IVPB (0 g Intravenous Stopped 08/08/16 0050)  sodium chloride 0.9 % bolus 1,000 mL (0 mLs Intravenous Stopped 08/08/16 0313)  ketorolac (TORADOL) 15 MG/ML injection 15 mg (15 mg Intravenous Given 08/08/16 0117)  HYDROcodone-acetaminophen (NORCO/VICODIN) 5-325 MG per tablet 2 tablet (2 tablets Oral Given 08/08/16 0116)     Initial Impression / Assessment and Plan / ED Course  I have reviewed the triage vital signs and the nursing notes.  Pertinent labs & imaging results that were available during my care of the patient were reviewed by me and considered in my medical decision making (see chart for details).    41 year old female here with generalized fatigue in the setting of recent diagnosis of UTI. I suspect her symptoms are secondary to ongoing UTI, with possible component of early, now treated pyelonephritis. Her lab work is overall very reassuring. She has no leukocytosis and her lactic acid is normal. I do not suspect sepsis. Her CMP does show mild dehydration and she has been given 2 L of fluid here. Urinalysis shows persistent pyuria. It is unclear whether this is secondary to ongoing UTI versus failure of antibiotics. I suspect it is more so due to ongoing UTI. However, patient would like to switch antibiotics. I'll give her a dose of Rocephin here and will switch her to Levaquin. Otherwise, no evidence of complicated pyelonephritis. She has no hematuria and her pain and symptoms are not consistent with infected stone. Will discharge home.  This note was prepared with assistance of Manufacturing engineer. Occasional wrong-word or sound-a-like substitutions may have occurred due to the inherent limitations of voice recognition software.   Final Clinical Impressions(s) / ED Diagnoses   Final diagnoses:  Acute cystitis with hematuria  Dehydration    New Prescriptions Discharge Medication List as of 08/08/2016 12:37 AM    START taking these medications   Details  HYDROcodone-acetaminophen (NORCO/VICODIN)  5-325 MG tablet Take 1-2 tablets by mouth every 4 (four) hours as needed for moderate pain or severe pain., Starting Sun 08/08/2016, Print    levofloxacin (LEVAQUIN) 750 MG tablet Take 1 tablet (750 mg total) by mouth daily., Starting Sun 08/08/2016, Until Fri 08/13/2016, Print    naproxen (NAPROSYN) 375 MG tablet Take 1 tablet (375 mg total) by mouth 2 (two) times daily as needed for moderate pain., Starting Sun 08/08/2016, Until Sun 08/15/2016, Print    ondansetron (ZOFRAN) 4 MG tablet Take 1 tablet (4 mg total) by mouth every 8 (eight) hours as needed for nausea or vomiting., Starting Sun 08/08/2016, Print         Shaune Pollack, MD 08/08/16 1136

## 2016-08-07 NOTE — ED Triage Notes (Signed)
States here on Tuesday and dx with UTI and not getting any better with back pain and headache and feels tired.

## 2016-08-08 LAB — COMPREHENSIVE METABOLIC PANEL
ALBUMIN: 4.6 g/dL (ref 3.5–5.0)
ALK PHOS: 67 U/L (ref 38–126)
ALT: 54 U/L (ref 14–54)
AST: 40 U/L (ref 15–41)
Anion gap: 11 (ref 5–15)
BILIRUBIN TOTAL: 0.4 mg/dL (ref 0.3–1.2)
BUN: 13 mg/dL (ref 6–20)
CALCIUM: 9.7 mg/dL (ref 8.9–10.3)
CO2: 21 mmol/L — AB (ref 22–32)
Chloride: 105 mmol/L (ref 101–111)
Creatinine, Ser: 1.03 mg/dL — ABNORMAL HIGH (ref 0.44–1.00)
GFR calc Af Amer: >60 mL/min (ref >60)
GFR calc non Af Amer: >60 mL/min (ref >60)
GLUCOSE: 84 mg/dL (ref 65–99)
Potassium: 4 mmol/L (ref 3.5–5.1)
SODIUM: 137 mmol/L (ref 135–145)
TOTAL PROTEIN: 8.5 g/dL — AB (ref 6.5–8.1)

## 2016-08-08 MED ORDER — SODIUM CHLORIDE 0.9 % IV BOLUS (SEPSIS)
1000.0000 mL | Freq: Once | INTRAVENOUS | Status: AC
  Administered 2016-08-08: 1000 mL via INTRAVENOUS

## 2016-08-08 MED ORDER — KETOROLAC TROMETHAMINE 15 MG/ML IJ SOLN
15.0000 mg | Freq: Once | INTRAMUSCULAR | Status: AC
  Administered 2016-08-08: 15 mg via INTRAVENOUS
  Filled 2016-08-08: qty 1

## 2016-08-08 MED ORDER — ONDANSETRON HCL 4 MG PO TABS: 4.0000 mg | ORAL_TABLET | Freq: Three times a day (TID) | ORAL | 0 refills | Status: DC | PRN

## 2016-08-08 MED ORDER — NAPROXEN 375 MG PO TABS: 375.0000 mg | ORAL_TABLET | Freq: Two times a day (BID) | ORAL | 0 refills | Status: AC | PRN

## 2016-08-08 MED ORDER — LEVOFLOXACIN 750 MG PO TABS: 750.0000 mg | ORAL_TABLET | Freq: Every day | ORAL | 0 refills | Status: AC

## 2016-08-08 MED ORDER — HYDROCODONE-ACETAMINOPHEN 5-325 MG PO TABS
2.0000 | ORAL_TABLET | Freq: Once | ORAL | Status: AC
  Administered 2016-08-08: 2 via ORAL
  Filled 2016-08-08: qty 2

## 2016-08-08 MED ORDER — HYDROCODONE-ACETAMINOPHEN 5-325 MG PO TABS: 1.0000 | ORAL_TABLET | ORAL | 0 refills | Status: DC | PRN

## 2016-08-09 LAB — URINE CULTURE
Culture: <10000 — AB
SPECIAL REQUESTS: NORMAL

## 2016-08-30 ENCOUNTER — Encounter (HOSPITAL_COMMUNITY): Payer: Self-pay | Admitting: Emergency Medicine

## 2016-08-30 ENCOUNTER — Emergency Department (HOSPITAL_COMMUNITY)
Admission: EM | Admit: 2016-08-30 | Discharge: 2016-08-31 | Disposition: A | Discharge status: Home / Self Care | Payer: Self-pay | Attending: Emergency Medicine | Admitting: Emergency Medicine

## 2016-08-30 DIAGNOSIS — Z87891 Personal history of nicotine dependence: Secondary | ICD-10-CM | POA: Insufficient documentation

## 2016-08-30 DIAGNOSIS — Z79899 Other long term (current) drug therapy: Secondary | ICD-10-CM | POA: Insufficient documentation

## 2016-08-30 DIAGNOSIS — R1032 Left lower quadrant pain: Secondary | ICD-10-CM | POA: Insufficient documentation

## 2016-08-30 DIAGNOSIS — K5900 Constipation, unspecified: Secondary | ICD-10-CM | POA: Insufficient documentation

## 2016-08-30 LAB — I-STAT CHEM 8, ED
BUN: 13 mg/dL (ref 6–20)
CHLORIDE: 104 mmol/L (ref 101–111)
Calcium, Ion: 1.15 mmol/L (ref 1.15–1.40)
Creatinine, Ser: 0.9 mg/dL (ref 0.44–1.00)
Glucose, Bld: 90 mg/dL (ref 65–99)
HEMATOCRIT: 39 % (ref 36.0–46.0)
Hemoglobin: 13.3 g/dL (ref 12.0–15.0)
Potassium: 4 mmol/L (ref 3.5–5.1)
SODIUM: 140 mmol/L (ref 135–145)
TCO2: 25 mmol/L (ref 0–100)

## 2016-08-30 LAB — URINALYSIS, ROUTINE W REFLEX MICROSCOPIC
Bilirubin Urine: NEGATIVE
Glucose, UA: NEGATIVE mg/dL
Hgb urine dipstick: NEGATIVE
KETONES UR: NEGATIVE mg/dL
LEUKOCYTES UA: NEGATIVE
NITRITE: NEGATIVE
PH: 5 (ref 5.0–8.0)
Protein, ur: NEGATIVE mg/dL
Specific Gravity, Urine: 1.02 (ref 1.005–1.030)

## 2016-08-30 LAB — POC OCCULT BLOOD, ED: FECAL OCCULT BLD: NEGATIVE

## 2016-08-30 NOTE — ED Provider Notes (Signed)
[facility]-EMERGENCY DEPT Provider Note   CSN: [id number] Arrival date & time: 08/30/16  2031  By signing my name below, I, Catherine Koch, attest that this documentation has been prepared under the direction and in the presence of [name], PA-C. Electronically Signed: Linna Koch, Scribe. 08/30/2016. 11:39 PM.  History   Chief Complaint Chief Complaint  Patient presents with  . Constipation  . Rectal Bleeding    The history is provided by the patient. No language interpreter was used.    HPI Comments: Catherine Koch is a 41 y.o. female who presents to the Emergency Department complaining of persistent constipation for a couple of weeks. She states she has been straining during BM's and has intermittently experienced bright red rectal bleeding during BM's. Patient is also reporting left flank pain, nausea, a reduced appetite, headaches, and fatigue for a prolonged period of time. She states she developed intermittent mild fevers beginning yesterday. Patient has tried stool softeners with no improvement of her constipation. She was diagnosed with a UTI on 3/13 after presenting here with urinary frequency along with some of the aforementioned complaints. She was discharged with Keflex and took it with resolution of her urinary frequency. Patient denies dysuria, vaginal discharge, melena, vomiting, or any other associated symptoms. She states she does not currently have a PCP because she does not have health insurance.  Past Medical History:  Diagnosis Date  . Anxiety   . Arthritis    pt. states in knees  . Chronic sinus infection   . Closed fracture of right distal fibula 11/19/2013  . Complication of anesthesia    pt. states difficult to wake up  . Depression   . GERD (gastroesophageal reflux disease)   . Headache    pt. states random migraines  . High cholesterol   . History of bronchitis   . History of degenerative disc disease   . Ovarian failure   . Shortness of breath    exertion from crutches  . Sleep apnea    cpap  . UTI (lower urinary tract infection)   . Wears glasses     Patient Active Problem List   Diagnosis Date Noted  . Deviated nasal septum 12/11/2014    Class: Chronic  . Closed fracture of right distal fibula 11/19/2013  . S/P ORIF (open reduction internal fixation) fracture 11/19/2013  . Obesity, morbid (HCC) 06/17/2013  . Chronic rhinitis 06/17/2013  . Acute maxillary sinusitis 06/03/2012  . Obstructive sleep apnea 01/15/2012  . Depression 01/15/2012    Past Surgical History:  Procedure Laterality Date  . CHOLECYSTECTOMY  2010  . CHOLECYSTECTOMY    . ESOPHAGOGASTRODUODENOSCOPY    . NASAL SEPTOPLASTY W/ TURBINOPLASTY Bilateral 12/11/2014   Procedure: NASAL SEPTOPLASTY AND BILATERAL INFERIOR TURBINATE REDUCTION;  Surgeon: Osborn Coho, MD;  Location: [facility] OR;  Service: ENT;  Laterality: Bilateral;  . ORIF FIBULA FRACTURE Right 11/19/2013   Procedure: OPEN REDUCTION INTERNAL FIXATION (ORIF) RIGHT FIBULA FRACTURE;  Surgeon: Eulas Post, MD;  Location: [facility] OR;  Service: Orthopedics;  Laterality: Right;  . WISDOM TOOTH EXTRACTION      OB History    No data available       Home Medications    Prior to Admission medications   Medication Sig Start Date End Date Taking? Authorizing Provider  acetaminophen (TYLENOL) 500 MG tablet Take 1,000 mg by mouth every 6 (six) hours as needed for mild pain. Reported on 09/06/2015   Yes Historical Provider, MD  Ca Phosphate-Cholecalciferol (CALCIUM 500 +  D3) 250-500 MG-UNIT CHEW Chew 1 tablet by mouth daily.   Yes Historical Provider, MD  cetirizine (ZYRTEC) 10 MG tablet Take 10 mg by mouth at bedtime.   Yes Historical Provider, MD  fluticasone (FLONASE) 50 MCG/ACT nasal spray Place 2 sprays into both nostrils daily. Patient taking differently: Place 2 sprays into both nostrils daily as needed for allergies.  05/14/16  Yes Liberty Handy, PA-C  ibuprofen (ADVIL,MOTRIN) 200 MG tablet Take 400  mg by mouth every 6 (six) hours as needed for moderate pain.   Yes Historical Provider, MD  Nutritional Supplements (ESTROVEN NIGHTTIME PO) Take 1 tablet by mouth at bedtime.   Yes Historical Provider, MD  ondansetron (ZOFRAN) 4 MG tablet Take 1 tablet (4 mg total) by mouth every 8 (eight) hours as needed for nausea or vomiting. 08/08/16  Yes Shaune Pollack, MD  cephALEXin (KEFLEX) 500 MG capsule Take 1 capsule (500 mg total) by mouth 4 (four) times daily. Patient not taking: Reported on 08/30/2016 08/03/16   Eber Hong, MD  guaiFENesin (ROBITUSSIN) 100 MG/5ML liquid Take 5-10 mLs (100-200 mg total) by mouth every 4 (four) hours as needed for cough. Patient not taking: Reported on 08/07/2016 05/14/16   Liberty Handy, PA-C  HYDROcodone-acetaminophen (NORCO/VICODIN) 5-325 MG tablet Take 1-2 tablets by mouth every 4 (four) hours as needed for moderate pain or severe pain. Patient not taking: Reported on 08/30/2016 08/08/16   Shaune Pollack, MD    Family History Family History  Problem Relation Age of Onset  . Allergies Sister   . Heart disease Paternal Grandmother   . Cancer Paternal Grandmother     BREAST CANCER  . Cancer Paternal Grandfather     Social History Social History  Substance Use Topics  . Smoking status: Former Smoker    Types: Cigarettes    Quit date: 05/24/2010  . Smokeless tobacco: Never Used     Comment: Pt. states she used to be a social smoker  . Alcohol use Yes     Comment: occasionally     Allergies   Sulfa antibiotics   Review of Systems Review of Systems  Constitutional: Positive for appetite change (reduced), fatigue and fever.  Gastrointestinal: Positive for anal bleeding, constipation and nausea. Negative for vomiting.       Negative for melena.  Genitourinary: Positive for flank pain (L). Negative for dysuria and vaginal discharge.  Neurological: Positive for headaches.   Physical Exam Updated Vital Signs BP 135/85 (BP Location: Right Arm)   Pulse  69   Temp 98.3 F (36.8 C) (Oral)   Resp 18   LMP 06/29/2013   SpO2 98%   Physical Exam  Constitutional: She is oriented to person, place, and time. She appears well-developed and well-nourished. No distress.  Patient in no acute distress.  HENT:  Head: Normocephalic and atraumatic.  Eyes: Conjunctivae and EOM are normal.  Neck: Neck supple. No tracheal deviation present.  Cardiovascular: Normal rate.   Pulmonary/Chest: Effort normal. No respiratory distress.  Abdominal: There is tenderness.  Obese abdomen. Limited exam. There is focal tenderness in the LLQ extending to the left flank. No other abdominal tenderness.  Genitourinary:  Genitourinary Comments: Rectal exam: Small external non-thrombosed hemorrhoid that has no active bleeding. No fissures. No stool in the rectal vault.  Musculoskeletal: Normal range of motion.  Neurological: She is alert and oriented to person, place, and time.  Skin: Skin is warm and dry.  Psychiatric: She has a normal mood and affect. Her behavior is  normal.  Nursing note and vitals reviewed.  ED Treatments / Results  Labs (all labs ordered are listed, but only abnormal results are displayed) Labs Reviewed  URINE CULTURE  CBC WITH DIFFERENTIAL/PLATELET  URINALYSIS, ROUTINE W REFLEX MICROSCOPIC  I-STAT CHEM 8, ED    EKG  EKG Interpretation None       Radiology No results found.  Procedures Procedures (including critical care time)  DIAGNOSTIC STUDIES: Oxygen Saturation is 98% on RA, normal by my interpretation.    COORDINATION OF CARE: 11:47 PM Discussed treatment plan with pt at bedside and pt agreed to plan.  Medications Ordered in ED Medications - No data to display   Initial Impression / Assessment and Plan / ED Course  I have reviewed the triage vital signs and the nursing notes.  Pertinent labs & imaging results that were available during my care of the patient were reviewed by me and considered in my medical decision  making (see chart for details).     Patient presents with decreased bowel movements and left flank to LLQ abdominal pain. No fever. Labs and imaging are unremarkable and show no evidence infection, obstruction or inflammation. Suspect constipation. Will provide referrals for PCP, recommend miralax.  Final Clinical Impressions(s) / ED Diagnoses   Final diagnoses:  None   1. LLQ abdominal pain 2. Constipation   New Prescriptions New Prescriptions   No medications on file   I personally performed the services described in this documentation, which was scribed in my presence. The recorded information has been reviewed and is accurate.     [name], PA-C 09/04/16 0507    Layla Maw Ward, DO 09/05/16 2359

## 2016-08-30 NOTE — ED Triage Notes (Signed)
Pt states she has had various problems for the past month  Pt states she recently was treated for a UTI that turned into a kidney infection so she was on antibiotics for that  Pt states now she is constipated and has not been able to pass her stool  States it gets there but will not come out  Pt states she has been having bleeding from her rectum like maybe from a hemorrhoid but she is not sure  Last good BM was 2 weeks ago

## 2016-08-31 ENCOUNTER — Emergency Department (HOSPITAL_COMMUNITY): Payer: Self-pay

## 2016-08-31 LAB — CBC WITH DIFFERENTIAL/PLATELET
BASOS PCT: 1 %
Basophils Absolute: 0 10*3/uL (ref 0.0–0.1)
EOS PCT: 2 %
Eosinophils Absolute: 0.1 10*3/uL (ref 0.0–0.7)
HEMATOCRIT: 38.7 % (ref 36.0–46.0)
Hemoglobin: 13.2 g/dL (ref 12.0–15.0)
LYMPHS PCT: 32 %
Lymphs Abs: 2.6 10*3/uL (ref 0.7–4.0)
MCH: 29.9 pg (ref 26.0–34.0)
MCHC: 34.1 g/dL (ref 30.0–36.0)
MCV: 87.6 fL (ref 78.0–100.0)
MONO ABS: 0.8 10*3/uL (ref 0.1–1.0)
MONOS PCT: 10 %
NEUTROS ABS: 4.5 10*3/uL (ref 1.7–7.7)
Neutrophils Relative %: 55 %
PLATELETS: 262 10*3/uL (ref 150–400)
RBC: 4.42 MIL/uL (ref 3.87–5.11)
RDW: 13.4 % (ref 11.5–15.5)
WBC: 8 10*3/uL (ref 4.0–10.5)

## 2016-08-31 MED ORDER — ONDANSETRON HCL 4 MG PO TABS: 4.0000 mg | ORAL_TABLET | Freq: Four times a day (QID) | ORAL | 0 refills | Status: DC

## 2016-08-31 MED ORDER — POLYETHYLENE GLYCOL 3350 17 G PO PACK: PACK | ORAL | 0 refills | Status: DC

## 2016-09-01 LAB — URINE CULTURE

## 2017-02-04 ENCOUNTER — Ambulatory Visit: Payer: Self-pay | Admitting: Internal Medicine

## 2017-02-15 ENCOUNTER — Ambulatory Visit (INDEPENDENT_AMBULATORY_CARE_PROVIDER_SITE_OTHER): Payer: Self-pay | Admitting: Internal Medicine

## 2017-02-15 ENCOUNTER — Encounter: Payer: Self-pay | Admitting: Internal Medicine

## 2017-02-15 VITALS — BP 118/82 | HR 71 | Ht 68.0 in | Wt 322.6 lb

## 2017-02-15 DIAGNOSIS — G4733 Obstructive sleep apnea (adult) (pediatric): Secondary | ICD-10-CM

## 2017-02-15 NOTE — Patient Instructions (Signed)
Print script-    CPAP machine of choice, auto 5-20, mask of choice, humidifier, supplies, AirView    Dx OSA  I suggest you ask Dr Annalee Genta your questions about tonsillectomy  Please call if we can help

## 2017-02-15 NOTE — Progress Notes (Signed)
HPI Female former smoker followed for OSA, complicated by anxiety, GERD, chronic rhinitis, morbid obesity NPSG 01/24/2012-severe obstructive sleep apnea. AHI 62 per hour. CPAP titration to 10 CWP/AHI 1.4 per hour   --------------------------------------------------------------------------------------------------  01/09/2016-40 year old female former smoker followed for OSA, complicated by GERD, morbid obesity,  CPAP 10/online for supplies FOLLOW FOR:  just got new new CPAP machine, old machine was knocked over by dog and broken. Did not bring SD card for download. Gets all her supplies online.  doing well on CPAP.    Had nasal septoplasty 11/2014-Dr. Annalee Genta No longer working but says her sleep schedule remains erratic after her previous job where there were frequent schedule changes. Also says pain from degenerative disc disease affects her sleeping. Got her rebuild CPAP machine from an online source, still set at 10. Husband says she does not snore through it. Some leaking around her fullface mask. We discussed mask fit issues. Sleeps better with CPAP than without it.  02/15/17- 41 year old female former smoker followed for OSA, complicated by GERD, morbid obesity,  CPAP auto 4-20/ online for supplies 45yr f/u for OSA.  Download 100% compliance, AHI 1.5/hour.. She asks about tonsillectomy as a possible benefit. Dr. Annalee Genta had previously done a septoplasty. Incidentally was acutely ill last night after trying an herbal "metabolism" product-dizzy, GI upset, slept poorly. Much better now.  ROS-see HPI  + = positive Constitutional:   No-   weight loss, night sweats, fevers, chills, fatigue, lassitude. HEENT:   No-  headaches, difficulty swallowing, tooth/dental problems, sore throat,       No-  sneezing, itching, ear ache, + nasal congestion, post nasal drip,  CV:  No-   chest pain, orthopnea, PND, swelling in lower extremities, anasarca, dizziness, palpitations Resp: No-   shortness of  breath with exertion or at rest.              No-   productive cough,  non-productive cough,  No- coughing up of blood.              No-   change in color of mucus.  No- wheezing.   Skin: No-   rash or lesions. GI:  No-   heartburn, indigestion, abdominal pain, nausea, vomiting, GU:  MS:  No-   joint pain or swelling.  Neuro-     nothing unusual Psych:  No- change in mood or affect. + depression or anxiety.  No memory loss.  OBJ- Physical Exam General- + alert, Oriented, Affect-appropriate, Distress- none acute, + obese Skin- rash-none, lesions- none, excoriation- none Lymphadenopathy- none Head- atraumatic            Eyes- Gross vision intact, PERRLA, conjunctivae and secretions clear            Ears- Hearing, canals-normal            Nose-             Throat- Mallampati III , mucosa clear , drainage- none, tonsils + Neck- flexible , trachea midline, no stridor , thyroid nl, carotid no bruit Chest - symmetrical excursion , unlabored           Heart/CV- RRR , no murmur , no gallop  , no rub, nl s1 s2                           - JVD- none , edema- none, stasis changes- none, varices- none  Lung- clear to P&A, wheeze- none, cough- none , dullness-none, rub- none           Chest wall-  Abd-  Br/ Gen/ Rectal- Not done, not indicated Extrem- cyanosis- none, clubbing, none, atrophy- none, strength- nl Neuro- grossly intact to observation

## 2017-02-27 NOTE — Assessment & Plan Note (Signed)
Good compliance and control with auto 4-20 CPAP. Husband confirms good control of snoring and daytime tiredness. I told her it would be perfectly fine to talk with Dr. Annalee Genta about tonsillectomy but I wouldn't expect it to make much difference for her.

## 2017-02-27 NOTE — Assessment & Plan Note (Signed)
Significant and sustained weight loss would take a lifestyle change she hasn't made yet. Consider bariatric referral.

## 2017-04-22 ENCOUNTER — Other Ambulatory Visit: Payer: Self-pay

## 2017-04-22 ENCOUNTER — Emergency Department (HOSPITAL_COMMUNITY)
Admission: EM | Admit: 2017-04-22 | Discharge: 2017-04-22 | Disposition: A | Discharge status: Home / Self Care | Payer: Self-pay | Attending: Emergency Medicine | Admitting: Emergency Medicine

## 2017-04-22 ENCOUNTER — Encounter (HOSPITAL_COMMUNITY): Payer: Self-pay

## 2017-04-22 DIAGNOSIS — J069 Acute upper respiratory infection, unspecified: Secondary | ICD-10-CM | POA: Insufficient documentation

## 2017-04-22 DIAGNOSIS — Z87891 Personal history of nicotine dependence: Secondary | ICD-10-CM | POA: Insufficient documentation

## 2017-04-22 DIAGNOSIS — Z79899 Other long term (current) drug therapy: Secondary | ICD-10-CM | POA: Insufficient documentation

## 2017-04-22 LAB — RAPID STREP SCREEN (MED CTR MEBANE ONLY): Streptococcus, Group A Screen (Direct): NEGATIVE

## 2017-04-22 MED ORDER — AMOXICILLIN-POT CLAVULANATE 875-125 MG PO TABS: 1.0000 | ORAL_TABLET | Freq: Two times a day (BID) | ORAL | 0 refills | Status: AC

## 2017-04-22 MED ORDER — FLUTICASONE PROPIONATE 50 MCG/ACT NA SUSP: 1.0000 | Freq: Every day | NASAL | 0 refills | Status: DC

## 2017-04-22 NOTE — Discharge Instructions (Signed)
Take antibiotics as prescribed.  Take the entire course of antibiotics, even if your symptoms resolve. Use Flonase daily to help with nasal congestion and cough. Use Tylenol or ibuprofen as needed for fever or body aches. Make sure you are continuing to stay well-hydrated with water. Return to the emergency room if you develop any worsening of symptoms, chest pain, difficulty breathing, or any new or concerning symptoms.

## 2017-04-22 NOTE — ED Provider Notes (Signed)
[facility]-EMERGENCY DEPT Provider Note   CSN: [id number] Arrival date & time: 04/22/17  2113     History   Chief Complaint Chief Complaint  Patient presents with  . Sore Throat    HPI Catherine Koch is a 41 y.o. female presenting with sore throat, fevers, and body aches.  Patient states that for the past week, she has had intermittent fevers, body aches, generalized tiredness, sore throat, and cough.  This is not been improving, despite her ibuprofen use.  She states she wears CPAP at night, but has still been waking up feeling exhausted.  She states her husband was sick earlier in the week.  She denies ear pain, eye pain, difficulty breathing, chest pain, nausea, vomiting, abdominal pain, urinary symptoms, abnormal bowel movements.  HPI  Past Medical History:  Diagnosis Date  . Anxiety   . Arthritis    pt. states in knees  . Chronic sinus infection   . Closed fracture of right distal fibula 11/19/2013  . Complication of anesthesia    pt. states difficult to wake up  . Depression   . GERD (gastroesophageal reflux disease)   . Headache    pt. states random migraines  . High cholesterol   . History of bronchitis   . History of degenerative disc disease   . Ovarian failure   . Shortness of breath    exertion from crutches  . Sleep apnea    cpap  . UTI (lower urinary tract infection)   . Wears glasses     Patient Active Problem List   Diagnosis Date Noted  . Deviated nasal septum 12/11/2014    Class: Chronic  . Closed fracture of right distal fibula 11/19/2013  . S/P ORIF (open reduction internal fixation) fracture 11/19/2013  . Obesity, morbid (HCC) 06/17/2013  . Chronic rhinitis 06/17/2013  . Acute maxillary sinusitis 06/03/2012  . Obstructive sleep apnea 01/15/2012  . Depression 01/15/2012    Past Surgical History:  Procedure Laterality Date  . CHOLECYSTECTOMY  2010  . CHOLECYSTECTOMY    . ESOPHAGOGASTRODUODENOSCOPY    . NASAL  SEPTOPLASTY W/ TURBINOPLASTY Bilateral 12/11/2014   Procedure: NASAL SEPTOPLASTY AND BILATERAL INFERIOR TURBINATE REDUCTION;  Surgeon: Osborn Cohoavid Shoemaker, MD;  [facility];  Service: ENT;  Laterality: Bilateral;  . ORIF FIBULA FRACTURE Right 11/19/2013   Procedure: OPEN REDUCTION INTERNAL FIXATION (ORIF) RIGHT FIBULA FRACTURE;  Surgeon: Eulas PostJoshua P Landau, MD;  Location: [facility] OR;  Service: Orthopedics;  Laterality: Right;  . WISDOM TOOTH EXTRACTION      OB History    No data available       Home Medications    Prior to Admission medications   Medication Sig Start Date End Date Taking? Authorizing Provider  cetirizine (ZYRTEC) 10 MG tablet Take 10 mg by mouth at bedtime.   Yes [provider]  DULoxetine (CYMBALTA) 60 MG capsule Take 60 mg by mouth daily.   Yes [provider]  ibuprofen (ADVIL,MOTRIN) 200 MG tablet Take 400 mg by mouth every 6 (six) hours as needed for headache, mild pain or moderate pain.   Yes [provider]  OVER THE COUNTER MEDICATION Take 1 each by mouth daily.   Yes [provider]  amoxicillin-clavulanate (AUGMENTIN) 875-125 MG tablet Take 1 tablet by mouth every 12 (twelve) hours for 10 days. 04/22/17 05/02/17  Mikolaj Woolstenhulme, PA-C  fluticasone (FLONASE) 50 MCG/ACT nasal spray Place 1 spray into both nostrils daily. 04/22/17   Alea Ryer, PA-C  Family History Family History  Problem Relation Age of Onset  . Allergies Sister   . Heart disease Paternal Grandmother   . Cancer Paternal Grandmother        BREAST CANCER  . Cancer Paternal Grandfather     Social History Social History   Tobacco Use  . Smoking status: Former Smoker    Types: Cigarettes    Last attempt to quit: 05/24/2010    Years since quitting: 6.9  . Smokeless tobacco: Never Used  . Tobacco comment: Pt. states she used to be a social smoker  Substance Use Topics  . Alcohol use: Yes    Comment: occasionally  . Drug use: No     Allergies     Sulfa antibiotics   Review of Systems Review of Systems  Constitutional: Positive for fever (Low-grade).  HENT: Positive for postnasal drip and sore throat. Negative for congestion, trouble swallowing and voice change.   Eyes: Negative for pain.  Respiratory: Positive for cough.   Musculoskeletal: Positive for myalgias.  Neurological: Positive for weakness.     Physical Exam Updated Vital Signs BP 137/79   Pulse 84   Temp 98.8 F (37.1 C) (Oral)   Resp 20   Ht 5\' 8"  (1.727 m)   Wt (!) 140.6 kg (310 lb)   LMP 06/29/2013 Comment: waiver signed  SpO2 100%   BMI 47.14 kg/m   Physical Exam  Constitutional: She is oriented to person, place, and time. She appears well-developed and well-nourished. No distress.  HENT:  Head: Normocephalic and atraumatic.  Right Ear: Tympanic membrane, external ear and ear canal normal.  Left Ear: Tympanic membrane, external ear and ear canal normal.  Nose: Mucosal edema present. Right sinus exhibits no maxillary sinus tenderness and no frontal sinus tenderness. Left sinus exhibits no maxillary sinus tenderness and no frontal sinus tenderness.  Mouth/Throat: Uvula is midline, oropharynx is clear and moist and mucous membranes are normal. No tonsillar exudate.  Nasal mucosal edema.  OP clear without tonsillar swelling or exudate.   Eyes: Conjunctivae and EOM are normal. Pupils are equal, round, and reactive to light.  Neck: Normal range of motion.  Cardiovascular: Normal rate, regular rhythm and intact distal pulses.  Pulmonary/Chest: Effort normal and breath sounds normal. She has no decreased breath sounds. She has no wheezes. She has no rhonchi. She has no rales.  Pt speaking in full sentences without difficulty.  Lung sounds clear in all fields  Abdominal: Soft. She exhibits no distension. There is no tenderness.  Musculoskeletal: Normal range of motion.  Lymphadenopathy:    She has no cervical adenopathy.  Neurological: She is alert and  oriented to person, place, and time.  Skin: Skin is warm.  Psychiatric: She has a normal mood and affect.  Nursing note and vitals reviewed.    ED Treatments / Results  Labs (all labs ordered are listed, but only abnormal results are displayed) Labs Reviewed  RAPID STREP SCREEN (NOT AT [facility])  CULTURE, GROUP A STREP [facility])    EKG  EKG Interpretation None       Radiology No results found.  Procedures Procedures (including critical care time)  Medications Ordered in ED Medications - No data to display   Initial Impression / Assessment and Plan / ED Course  I have reviewed the triage vital signs and the nursing notes.  Pertinent labs & imaging results that were available during my care of the patient were reviewed by me and considered in my medical decision making (  see chart for details).     Patient presenting with 1 week of URI symptoms.  Physical exam reassuring, she is afebrile not tachycardic.  She does not appear toxic.  Doubt pneumonia.  Strep negative.   Doubt peritonsillar abscess.  As patient is high risk, will place on antibiotics.  Discussed option of chest x-ray, and patient elects not to do one today.  Patient given prescription for Flonase and antibiotics.  At this time, patient appears safe for discharge.  Return precautions given.  Patient states she understands and agrees to plan.   Final Clinical Impressions(s) / ED Diagnoses   Final diagnoses:  Upper respiratory tract infection, unspecified type    ED Discharge Orders        Ordered    fluticasone (FLONASE) 50 MCG/ACT nasal spray  Daily     04/22/17 2325    amoxicillin-clavulanate (AUGMENTIN) 875-125 MG tablet  Every 12 hours     04/22/17 2325       Alveria ApleyCaccavale, Markeria Goetsch, PA-C 04/22/17 2335    Vanetta MuldersZackowski, Scott, MD 04/23/17 1754

## 2017-04-22 NOTE — ED Notes (Signed)
Pt is alert and oriented x 4 and is verbally responsive.Pt c/o of sore throat  X 5 day. Pt reports intermittent fever, body aches. Pt reports a productive cough of yellow phlegm .

## 2017-04-25 LAB — CULTURE, GROUP A STREP (THRC)

## 2017-09-02 ENCOUNTER — Other Ambulatory Visit: Payer: Self-pay | Admitting: Family Medicine

## 2017-09-02 DIAGNOSIS — R748 Abnormal levels of other serum enzymes: Secondary | ICD-10-CM

## 2017-09-12 ENCOUNTER — Ambulatory Visit
Admission: RE | Admit: 2017-09-12 | Discharge: 2017-09-12 | Disposition: A | Discharge status: Home / Self Care | Payer: Self-pay | Source: Ambulatory Visit | Attending: Family Medicine | Admitting: Family Medicine

## 2017-09-12 DIAGNOSIS — R748 Abnormal levels of other serum enzymes: Secondary | ICD-10-CM

## 2017-10-25 ENCOUNTER — Emergency Department (HOSPITAL_COMMUNITY)
Admission: EM | Admit: 2017-10-25 | Discharge: 2017-10-25 | Disposition: A | Discharge status: Home / Self Care | Payer: BLUE CROSS/BLUE SHIELD | Attending: Emergency Medicine | Admitting: Emergency Medicine

## 2017-10-25 ENCOUNTER — Emergency Department (HOSPITAL_COMMUNITY): Payer: BLUE CROSS/BLUE SHIELD

## 2017-10-25 ENCOUNTER — Encounter (HOSPITAL_COMMUNITY): Payer: Self-pay | Admitting: Family Medicine

## 2017-10-25 DIAGNOSIS — X58XXXA Exposure to other specified factors, initial encounter: Secondary | ICD-10-CM | POA: Diagnosis not present

## 2017-10-25 DIAGNOSIS — Y929 Unspecified place or not applicable: Secondary | ICD-10-CM | POA: Diagnosis not present

## 2017-10-25 DIAGNOSIS — Z87891 Personal history of nicotine dependence: Secondary | ICD-10-CM | POA: Insufficient documentation

## 2017-10-25 DIAGNOSIS — R079 Chest pain, unspecified: Secondary | ICD-10-CM | POA: Diagnosis not present

## 2017-10-25 DIAGNOSIS — I10 Essential (primary) hypertension: Secondary | ICD-10-CM

## 2017-10-25 DIAGNOSIS — Y999 Unspecified external cause status: Secondary | ICD-10-CM | POA: Diagnosis not present

## 2017-10-25 DIAGNOSIS — Z79899 Other long term (current) drug therapy: Secondary | ICD-10-CM | POA: Insufficient documentation

## 2017-10-25 DIAGNOSIS — S99922A Unspecified injury of left foot, initial encounter: Secondary | ICD-10-CM | POA: Diagnosis present

## 2017-10-25 DIAGNOSIS — S99102A Unspecified physeal fracture of left metatarsal, initial encounter for closed fracture: Secondary | ICD-10-CM | POA: Insufficient documentation

## 2017-10-25 DIAGNOSIS — Y9301 Activity, walking, marching and hiking: Secondary | ICD-10-CM | POA: Diagnosis not present

## 2017-10-25 LAB — BASIC METABOLIC PANEL
Anion gap: 9 (ref 5–15)
BUN: 12 mg/dL (ref 6–20)
CALCIUM: 9.5 mg/dL (ref 8.9–10.3)
CO2: 27 mmol/L (ref 22–32)
CREATININE: 0.84 mg/dL (ref 0.44–1.00)
Chloride: 106 mmol/L (ref 101–111)
GFR calc Af Amer: >60 mL/min (ref >60)
GFR calc non Af Amer: >60 mL/min (ref >60)
Glucose, Bld: 95 mg/dL (ref 65–99)
Potassium: 4.3 mmol/L (ref 3.5–5.1)
SODIUM: 142 mmol/L (ref 135–145)

## 2017-10-25 LAB — I-STAT BETA HCG BLOOD, ED (MC, WL, AP ONLY): I-stat hCG, quantitative: <5 m[IU]/mL (ref <5)

## 2017-10-25 LAB — CBC
HCT: 39.5 % (ref 36.0–46.0)
Hemoglobin: 13.1 g/dL (ref 12.0–15.0)
MCH: 30.6 pg (ref 26.0–34.0)
MCHC: 33.2 g/dL (ref 30.0–36.0)
MCV: 92.3 fL (ref 78.0–100.0)
PLATELETS: 319 10*3/uL (ref 150–400)
RBC: 4.28 MIL/uL (ref 3.87–5.11)
RDW: 14.1 % (ref 11.5–15.5)
WBC: 9.5 10*3/uL (ref 4.0–10.5)

## 2017-10-25 LAB — I-STAT TROPONIN, ED: Troponin i, poc: 0 ng/mL (ref 0.00–0.08)

## 2017-10-25 MED ORDER — RANITIDINE HCL 150 MG PO TABS: 150.0000 mg | ORAL_TABLET | Freq: Two times a day (BID) | ORAL | 0 refills | Status: DC

## 2017-10-25 NOTE — ED Provider Notes (Signed)
[facility]   CSN: [id number] Arrival date & time: 10/25/17  1956     History   Chief Complaint Chief Complaint  Patient presents with  . Foot Injury    HPI Catherine Koch is a 42 y.o. female with a hx of arthritis, anxiety, depression, GERD, sleep apnea presents to the Emergency Department complaining of acute, persistent, pain in the left foot after stepping on a curb 5 days ago.  Pt reports redness, pain and swelling since that time.  Pt reports ice and elevation without improvement.  No analgesia since the incident.  Pt reports she did not fall or hit her head at the time.  Pt reports walking makes the symptoms worse and rest makes them better.  She denies weakness, numbness, tingling, open wounds.  Pt also reports central chest pain onset 5 days ago.  Pt reports high blood pressure at home.  She reports associated generalized fatigue and general malaise since that time.  She reports she feels some out of breath, but this is not exertional.  Pt reports she has a CPAP machine at night and reports compliance.  Pt reports chest pain is intermittent and rated at a 5-6/10.  She reports lots of movement does worsen the pain, but walking makes the symptoms better and not worse.  Pt denies personal cardiac hx.  Pt denies oral contraceptive, denies hx of Lupus or DVT, no recent immobilization, surgeries.  She reports that she has had steadily increasing blood pressure over the last year.  She reports no official diagnosis of hypertension however she does have an appointment with her primary care provider on June 6 for blood pressure recheck.  Her primary care provider told her she should anticipate beginning medication.  The history is provided by the patient, medical records and a significant other. No language interpreter was used.    Past Medical History:  Diagnosis Date  . Anxiety   . Arthritis    pt. states in knees  . Chronic sinus  infection   . Closed fracture of right distal fibula 11/19/2013  . Complication of anesthesia    pt. states difficult to wake up  . Depression   . GERD (gastroesophageal reflux disease)   . Headache    pt. states random migraines  . High cholesterol   . History of bronchitis   . History of degenerative disc disease   . Ovarian failure   . Shortness of breath    exertion from crutches  . Sleep apnea    cpap  . UTI (lower urinary tract infection)   . Wears glasses     Patient Active Problem List   Diagnosis Date Noted  . Deviated nasal septum 12/11/2014    Class: Chronic  . Closed fracture of right distal fibula 11/19/2013  . S/P ORIF (open reduction internal fixation) fracture 11/19/2013  . Obesity, morbid (HCC) 06/17/2013  . Chronic rhinitis 06/17/2013  . Acute maxillary sinusitis 06/03/2012  . Obstructive sleep apnea 01/15/2012  . Depression 01/15/2012    Past Surgical History:  Procedure Laterality Date  . CHOLECYSTECTOMY  2010  . CHOLECYSTECTOMY    . ESOPHAGOGASTRODUODENOSCOPY    . NASAL SEPTOPLASTY W/ TURBINOPLASTY Bilateral 12/11/2014   Procedure: NASAL SEPTOPLASTY AND BILATERAL INFERIOR TURBINATE REDUCTION;  Surgeon: Osborn Coho, MD;  Location: [facility] OR;  Service: ENT;  Laterality: Bilateral;  . ORIF FIBULA FRACTURE Right 11/19/2013   Procedure: OPEN REDUCTION INTERNAL FIXATION (ORIF) RIGHT FIBULA FRACTURE;  Surgeon: Ivin Booty  Robie RidgeP Landau, MD;  Location: [facility] OR;  Service: Orthopedics;  Laterality: Right;  . WISDOM TOOTH EXTRACTION       OB History   None      Home Medications    Prior to Admission medications   Medication Sig Start Date End Date Taking? Authorizing Provider  cetirizine (ZYRTEC) 10 MG tablet Take 10 mg by mouth at bedtime.   Yes [provider]  DULoxetine (CYMBALTA) 60 MG capsule Take 60 mg by mouth daily.   Yes [provider]  fluticasone (FLONASE) 50 MCG/ACT nasal spray Place 1 spray into both nostrils daily. 04/22/17  Yes  Caccavale, Sophia, PA-C  OVER THE COUNTER MEDICATION Take 1 each by mouth daily.   Yes [provider]  ranitidine (ZANTAC) 150 MG tablet Take 1 tablet (150 mg total) by mouth 2 (two) times daily. 10/25/17   Fujiko Picazo, Dahlia ClientHannah, PA-C    Family History Family History  Problem Relation Age of Onset  . Allergies Sister   . Heart disease Paternal Grandmother   . Cancer Paternal Grandmother        BREAST CANCER  . Cancer Paternal Grandfather     Social History Social History   Tobacco Use  . Smoking status: Former Smoker    Types: Cigarettes    Last attempt to quit: 05/24/2010    Years since quitting: 7.4  . Smokeless tobacco: Never Used  . Tobacco comment: Pt. states she used to be a social smoker  Substance Use Topics  . Alcohol use: Not Currently  . Drug use: No     Allergies   Sulfa antibiotics   Review of Systems Review of Systems  Constitutional: Positive for fatigue. Negative for appetite change, diaphoresis, fever and unexpected weight change.  HENT: Negative for mouth sores.   Eyes: Negative for visual disturbance.  Respiratory: Negative for cough, chest tightness, shortness of breath and wheezing.   Cardiovascular: Positive for chest pain.  Gastrointestinal: Negative for abdominal pain, constipation, diarrhea, nausea and vomiting.  Endocrine: Negative for polydipsia, polyphagia and polyuria.  Genitourinary: Negative for dysuria, frequency, hematuria and urgency.  Musculoskeletal: Positive for arthralgias, joint swelling and myalgias. Negative for back pain and neck stiffness.  Skin: Negative for rash.  Allergic/Immunologic: Negative for immunocompromised state.  Neurological: Negative for syncope, light-headedness and headaches.  Hematological: Does not bruise/bleed easily.  Psychiatric/Behavioral: Negative for sleep disturbance. The patient is not nervous/anxious.      Physical Exam Updated Vital Signs BP (!) 146/110 (BP Location: Left Arm)   Pulse  78   Temp 98.7 F (37.1 C) (Oral)   Resp 15   Ht 5\' 8"  (1.727 m)   Wt (!) 145.1 kg (319 lb 12.8 oz)   LMP 06/29/2013 Comment: waiver signed  SpO2 100%   BMI 48.63 kg/m   Physical Exam  Constitutional: She appears well-developed and well-nourished. No distress.  Awake, alert, nontoxic appearance  HENT:  Head: Normocephalic and atraumatic.  Mouth/Throat: Oropharynx is clear and moist. No oropharyngeal exudate.  Eyes: Conjunctivae are normal. No scleral icterus.  Neck: Normal range of motion. Neck supple.  Cardiovascular: Normal rate, regular rhythm and intact distal pulses.  Pulmonary/Chest: Effort normal and breath sounds normal. No respiratory distress. She has no wheezes.  Equal chest expansion  Abdominal: Soft. Bowel sounds are normal. She exhibits no mass. There is no tenderness. There is no rebound and no guarding.  Musculoskeletal: Normal range of motion. She exhibits no edema.       Left foot: There  is tenderness. There is no deformity and no laceration.  Full range of motion of all toes of the left foot.  Mild edema and some ecchymosis of the distal and lateral foot with tenderness to palpation over the fourth metatarsal.  No palpable deformity. Bilateral calves are without tenderness or palpable cord.  Neurological: She is alert.  Speech is clear and goal oriented Moves extremities without ataxia  Skin: Skin is warm and dry. She is not diaphoretic.  Psychiatric: She has a normal mood and affect.  Nursing note and vitals reviewed.    ED Treatments / Results  Labs (all labs ordered are listed, but only abnormal results are displayed) Labs Reviewed  BASIC METABOLIC PANEL  CBC  I-STAT TROPONIN, ED  I-STAT BETA HCG BLOOD, ED ([facility], [facility], [facility] ONLY)    EKG EKG Interpretation  Date/Time:  Tuesday October 25 2017 20:28:46 EDT Ventricular Rate:  86 PR Interval:  144 QRS Duration: 82 QT Interval:  370 QTC Calculation: 442 R Axis:   63 Text Interpretation:  Sinus rhythm  with Premature atrial complexes with Abberant conduction Otherwise normal ECG no significant change since Mar 2017 Confirmed by Pricilla Loveless [phone]) on 10/25/2017 11:01:22 PM   Radiology Dg Chest 2 View  Result Date: 10/25/2017 CLINICAL DATA:  Injury.  Chest pain. EXAM: CHEST - 2 VIEW COMPARISON:  08/03/2016 FINDINGS: Cardiomediastinal silhouette is normal. Mediastinal contours appear intact. There is no evidence of focal airspace consolidation, pleural effusion or pneumothorax. Osseous structures are without acute abnormality. Soft tissues are grossly normal. IMPRESSION: No active cardiopulmonary disease. Electronically Signed   By: Ted Mcalpine M.D.   On: 10/25/2017 22:04   Dg Foot Complete Left  Result Date: 10/25/2017 CLINICAL DATA:  Left foot pain after injury. Stepped on a curb, heard a crack. EXAM: LEFT FOOT - COMPLETE 3+ VIEW COMPARISON:  None. FINDINGS: Minimally displaced fracture fourth metatarsal neck without intra-articular extension. No additional acute fracture of the foot. The alignment and joint spaces are maintained. There is dorsal soft tissue edema. IMPRESSION: Minimally displaced fracture distal fourth metatarsal neck without intra-articular extension. Electronically Signed   By: Rubye Oaks M.D.   On: 10/25/2017 22:10    Procedures Procedures (including critical care time)  Medications Ordered in ED Medications - No data to display   Initial Impression / Assessment and Plan / ED Course  I have reviewed the triage vital signs and the nursing notes.  Pertinent labs & imaging results that were available during my care of the patient were reviewed by me and considered in my medical decision making (see chart for details).  Clinical Course as of Oct 25 2309  Tue Oct 25, 2017  2241 neg  Troponin i, poc: 0.00 [HM]  2241 normal  WBC: 9.5 [HM]  2309 Fourth metatarsal fracture that does not extend into the joint space.  I personally evaluated these images.  DG  Foot Complete Left [HM]  2309 No evidence of widened mediastinum, free air, pulmonary edema, pneumothorax or pneumonia.  I personally evaluated these images.  DG Chest 2 View [HM]    Clinical Course User Index [HM] Evelyna Folker, Dahlia Client, [place]    Patient presents with several complaints.  Injury to the left foot several days ago.  X-ray shows metatarsal fracture.  Patient placed in postop shoe.  She is neurovascularly intact.  She does not wish for crutches or pain control.  Additionally, patient with some chest pain and myalgias.  Patient is a low risk with a heart score  of 1.  Her EKG is without acute ischemia.  Several PACs are noted however palpable pulses were with regular rhythm on my exam.  No tachycardia.  No specific shortness of breath.  She is PERC negative and I doubt pulmonary embolism today.  Patient does have a history of GERD and is not currently taking any medications for this.  Will start Zantac.  Patient is noted to be hypertensive here in the department.  She reports a history of high blood pressures and already has a scheduled appointment for this.  Patient is to follow with her primary care provider at her scheduled appointment for further evaluation.  Discussed reasons to return immediately to the emergency department.  Patient and husband state understanding.  Final Clinical Impressions(s) / ED Diagnoses   Final diagnoses:  Central chest pain  Unspecified physeal fracture of left metatarsal, initial encounter for closed fracture  Essential hypertension    ED Discharge Orders        Ordered    ranitidine (ZANTAC) 150 MG tablet  2 times daily     10/25/17 2301       Tarin Navarez, Dahlia Client, PA-C 10/25/17 2311    Pricilla Loveless, MD 10/26/17 1510

## 2017-10-25 NOTE — Discharge Instructions (Addendum)
1. Medications: alternate ibuprofen and tylenol for pain control, usual home medications 2. Treatment: rest, ice, elevate and use shoe, drink plenty of fluids, gentle stretching 3. Follow Up: Please followup with your PCP at your appointment on June 10th for discussion of your diagnoses and further evaluation after today's visit; Please return to the ER for worsening symptoms, new or worsening chest pain, chest pain that is worse with walking or other concerns

## 2017-10-25 NOTE — ED Triage Notes (Signed)
Patient reports on Friday she stepping up on a curb, where half of her foot was on the curb and on the lower ground. She reports she heard and felt a crack. Patient reports she has been on her foot at times but tried to elevate and use ice. Patient reports she thinks the pinky is fractured. Also, patient is is complaining of mid chest pain that started this weekend. Pain described as intermittent, pressure. Associated symptoms of nausea.

## 2018-10-24 ENCOUNTER — Other Ambulatory Visit: Payer: Self-pay

## 2018-10-30 LAB — NOVEL CORONAVIRUS, NAA: SARS-CoV-2, NAA: NOT DETECTED

## 2019-06-19 ENCOUNTER — Ambulatory Visit: Payer: Self-pay | Attending: Internal Medicine

## 2019-06-19 DIAGNOSIS — Z20822 Contact with and (suspected) exposure to covid-19: Secondary | ICD-10-CM

## 2019-06-20 LAB — NOVEL CORONAVIRUS, NAA: SARS-CoV-2, NAA: NOT DETECTED

## 2019-08-04 ENCOUNTER — Ambulatory Visit
Admission: EM | Admit: 2019-08-04 | Discharge: 2019-08-04 | Disposition: A | Discharge status: Home / Self Care | Payer: Self-pay | Attending: Emergency Medicine | Admitting: Emergency Medicine

## 2019-08-04 ENCOUNTER — Encounter: Payer: Self-pay | Admitting: Emergency Medicine

## 2019-08-04 ENCOUNTER — Other Ambulatory Visit: Payer: Self-pay

## 2019-08-04 DIAGNOSIS — R1012 Left upper quadrant pain: Secondary | ICD-10-CM

## 2019-08-04 LAB — POCT URINALYSIS DIP (MANUAL ENTRY)
Bilirubin, UA: NEGATIVE
Blood, UA: NEGATIVE
Glucose, UA: NEGATIVE mg/dL
Ketones, POC UA: NEGATIVE mg/dL
Leukocytes, UA: NEGATIVE
Nitrite, UA: NEGATIVE
Protein Ur, POC: NEGATIVE mg/dL
Spec Grav, UA: >=1.03 — AB (ref 1.010–1.025)
Urobilinogen, UA: 0.2 E.U./dL
pH, UA: 6 (ref 5.0–8.0)

## 2019-08-04 LAB — POCT FASTING CBG KUC MANUAL ENTRY: POCT Glucose (KUC): 98 mg/dL (ref 70–99)

## 2019-08-04 MED ORDER — FAMOTIDINE 40 MG PO TABS: 40.0000 mg | ORAL_TABLET | Freq: Every day | ORAL | 0 refills | Status: DC

## 2019-08-04 NOTE — Discharge Instructions (Addendum)
Recommend you keep symptom log and follow up with PCP. Labs pending: will call you tomorrow with results. Go to ER for worsening pain, fever, chest pain, difficulty breathing.

## 2019-08-04 NOTE — ED Triage Notes (Signed)
Patient presents to [facility] for assessment of left back and flank pain x 1 week, with the last three days being the worst.  C/o fatigue, body aches, lower abdominal pressure/pain, nausea.

## 2019-08-04 NOTE — ED Provider Notes (Signed)
[facility]    CSN: [id number] Arrival date & time: 08/04/19  1313      History   Chief Complaint Chief Complaint  Patient presents with  . Back Pain  . Generalized Body Aches    HPI Catherine Koch is a 44 y.o. female with history of obesity presenting for LUQ pain and left flank pain x1 week.  States he got worse over the last 3 days prompting evaluation.  Endorsing fatigue, malaise, mild frontal headaches.  Describes left side pain as a fullness that radiates towards back.  Has had some nausea without emesis.  Denies personal history of diabetes, pancreatitis.  Endorses family history of diabetes.  Has also been feeling thirsty.  Able to keep down food, beverage: Last ate last night.  Does endorse history of GERD, IBS.  Last bowel movement was yesterday without blood or melena.   Past Medical History:  Diagnosis Date  . Anxiety   . Arthritis    pt. states in knees  . Chronic sinus infection   . Closed fracture of right distal fibula 11/19/2013  . Complication of anesthesia    pt. states difficult to wake up  . Depression   . GERD (gastroesophageal reflux disease)   . Headache    pt. states random migraines  . High cholesterol   . History of bronchitis   . History of degenerative disc disease   . Ovarian failure   . Shortness of breath    exertion from crutches  . Sleep apnea    cpap  . UTI (lower urinary tract infection)   . Wears glasses     Patient Active Problem List   Diagnosis Date Noted  . Deviated nasal septum 12/11/2014    Class: Chronic  . Closed fracture of right distal fibula 11/19/2013  . S/P ORIF (open reduction internal fixation) fracture 11/19/2013  . Obesity, morbid (HCC) 06/17/2013  . Chronic rhinitis 06/17/2013  . Acute maxillary sinusitis 06/03/2012  . Obstructive sleep apnea 01/15/2012  . Depression 01/15/2012    Past Surgical History:  Procedure Laterality Date  . CHOLECYSTECTOMY  2010  . CHOLECYSTECTOMY    .  ESOPHAGOGASTRODUODENOSCOPY    . NASAL SEPTOPLASTY W/ TURBINOPLASTY Bilateral 12/11/2014   Procedure: NASAL SEPTOPLASTY AND BILATERAL INFERIOR TURBINATE REDUCTION;  Surgeon: Osborn Coho, MD;  Location: [facility] OR;  Service: ENT;  Laterality: Bilateral;  . ORIF FIBULA FRACTURE Right 11/19/2013   Procedure: OPEN REDUCTION INTERNAL FIXATION (ORIF) RIGHT FIBULA FRACTURE;  Surgeon: Eulas Post, MD;  Location: [facility] OR;  Service: Orthopedics;  Laterality: Right;  . WISDOM TOOTH EXTRACTION      OB History   No obstetric history on file.      Home Medications    Prior to Admission medications   Medication Sig Start Date End Date Taking? Authorizing Provider  hydrOXYzine (ATARAX/VISTARIL) 25 MG tablet Take 25 mg by mouth at bedtime as needed.    Yes [provider]  cetirizine (ZYRTEC) 10 MG tablet Take 10 mg by mouth at bedtime.    [provider]  DULoxetine (CYMBALTA) 60 MG capsule Take 60 mg by mouth daily.    [provider]  famotidine (PEPCID) 40 MG tablet Take 1 tablet (40 mg total) by mouth daily. 08/04/19   Hall-Potvin, [place], PA-C  fluticasone (FLONASE) 50 MCG/ACT nasal spray Place 1 spray into both nostrils daily. 04/22/17   Caccavale, Sophia, PA-C  OVER THE COUNTER MEDICATION Take 1 each by mouth daily.    [provider]  ranitidine (ZANTAC) 150 MG tablet Take 1 tablet (150 mg total) by mouth 2 (two) times daily. 10/25/17 08/04/19  Muthersbaugh, Jarrett Soho, PA-C    Family History Family History  Problem Relation Age of Onset  . Allergies Sister   . Heart disease Paternal Grandmother   . Cancer Paternal Grandmother        BREAST CANCER  . Cancer Paternal Grandfather     Social History Social History   Tobacco Use  . Smoking status: Former Smoker    Types: Cigarettes    Quit date: 05/24/2010    Years since quitting: 9.2  . Smokeless tobacco: Never Used  . Tobacco comment: Pt. states she used to be a social smoker  Substance Use Topics  .  Alcohol use: Not Currently  . Drug use: No     Allergies   Sulfa antibiotics   Review of Systems As per HPI   Physical Exam Triage Vital Signs ED Triage Vitals  Enc Vitals Group     BP      Pulse      Resp      Temp      Temp src      SpO2      Weight      Height      Head Circumference      Peak Flow      Pain Score      Pain Loc      Pain Edu?      Excl. in [place]?    No data found.  Updated Vital Signs BP (!) 171/96 (BP Location: Left Wrist)   Pulse 80   Temp 98.1 F (36.7 C) (Temporal)   Resp 18   LMP 06/29/2013 Comment: waiver signed  SpO2 94%   Visual Acuity Right Eye Distance:   Left Eye Distance:   Bilateral Distance:    Right Eye Near:   Left Eye Near:    Bilateral Near:     Physical Exam Constitutional:      General: She is not in acute distress.    Appearance: She is obese. She is not ill-appearing.  HENT:     Head: Normocephalic and atraumatic.     Mouth/Throat:     Mouth: Mucous membranes are moist.     Pharynx: Oropharynx is clear.  Eyes:     General: No scleral icterus.    Conjunctiva/sclera: Conjunctivae normal.     Pupils: Pupils are equal, round, and reactive to light.  Cardiovascular:     Rate and Rhythm: Normal rate and regular rhythm.  Pulmonary:     Effort: Pulmonary effort is normal. No respiratory distress.     Breath sounds: No wheezing.  Abdominal:     General: Abdomen is protuberant. Bowel sounds are normal. There is no abdominal bruit.     Palpations: Abdomen is soft. There is no hepatomegaly or splenomegaly.     Tenderness: There is abdominal tenderness in the left upper quadrant. There is no right CVA tenderness, left CVA tenderness or guarding. Negative signs include Murphy's sign, Rovsing's sign and McBurney's sign.       Comments: Exam limited second to habitus  Musculoskeletal:     Cervical back: Neck supple. No tenderness.  Lymphadenopathy:     Cervical: No cervical adenopathy.  Skin:    Capillary Refill:  Capillary refill takes less than 2 seconds.     Coloration: Skin is not jaundiced or pale.  Neurological:     General:  No focal deficit present.     Mental Status: She is alert and oriented to person, place, and time.      UC Treatments / Results  Labs (all labs ordered are listed, but only abnormal results are displayed) Labs Reviewed  POCT URINALYSIS DIP (MANUAL ENTRY) - Abnormal; Notable for the following components:      Result Value   Spec Grav, UA >=1.030 (*)    All other components within normal limits  POCT FASTING CBG KUC MANUAL ENTRY - Normal  CBC WITH DIFFERENTIAL/PLATELET  COMPREHENSIVE METABOLIC PANEL  AMYLASE  LIPASE    EKG   Radiology No results found.  Procedures Procedures (including critical care time)  Medications Ordered in UC Medications - No data to display  Initial Impression / Assessment and Plan / UC Course  I have reviewed the triage vital signs and the nursing notes.  Pertinent labs & imaging results that were available during my care of the patient were reviewed by me and considered in my medical decision making (see chart for details).     Patient afebrile, nontoxic in office today. Patient largely concerned about possible kidney infection: Urine dipstick showing elevated specific gravity, otherwise WNL. CBG 98 fasting. Patient has history of premature ovarian failure, not currently sexually active: Pregnancy deferred. Will obtain basic labs to further guide management and screening for pancreatitis, cholecystitis-verified patient's mobile number. Patient to resume H2 blocker for GERD, keep symptom log, follow-up with PCP for further evaluation/management if needed.  Return precautions discussed, patient verbalized understanding and is agreeable to plan. Final Clinical Impressions(s) / UC Diagnoses   Final diagnoses:  LUQ pain     Discharge Instructions     Recommend you keep symptom log and follow up with PCP. Labs pending: will call  you tomorrow with results. Go to ER for worsening pain, fever, chest pain, difficulty breathing.    ED Prescriptions    Medication Sig Dispense Auth. Provider   famotidine (PEPCID) 40 MG tablet Take 1 tablet (40 mg total) by mouth daily. 30 tablet Hall-Potvin, [place], PA-C     PDMP not reviewed this encounter.   Hall-Potvin, [place], [place] 08/04/19 1611

## 2019-08-05 LAB — CBC WITH DIFFERENTIAL/PLATELET
Basophils Absolute: 0.1 10*3/uL (ref 0.0–0.2)
Basos: 1 %
EOS (ABSOLUTE): 0.2 10*3/uL (ref 0.0–0.4)
Eos: 3 %
Hematocrit: 39.7 % (ref 34.0–46.6)
Hemoglobin: 13.5 g/dL (ref 11.1–15.9)
Immature Grans (Abs): 0 10*3/uL (ref 0.0–0.1)
Immature Granulocytes: 0 %
Lymphocytes Absolute: 3.2 10*3/uL — ABNORMAL HIGH (ref 0.7–3.1)
Lymphs: 40 %
MCH: 31 pg (ref 26.6–33.0)
MCHC: 34 g/dL (ref 31.5–35.7)
MCV: 91 fL (ref 79–97)
Monocytes Absolute: 0.6 10*3/uL (ref 0.1–0.9)
Monocytes: 7 %
Neutrophils Absolute: 4 10*3/uL (ref 1.4–7.0)
Neutrophils: 49 %
Platelets: 293 10*3/uL (ref 150–450)
RBC: 4.36 x10E6/uL (ref 3.77–5.28)
RDW: 13.5 % (ref 11.7–15.4)
WBC: 8.1 10*3/uL (ref 3.4–10.8)

## 2019-08-05 LAB — AMYLASE: Amylase: 41 U/L (ref 31–110)

## 2019-08-05 LAB — COMPREHENSIVE METABOLIC PANEL
ALT: 68 IU/L — ABNORMAL HIGH (ref 0–32)
AST: 42 IU/L — ABNORMAL HIGH (ref 0–40)
Albumin/Globulin Ratio: 1.4 (ref 1.2–2.2)
Albumin: 4 g/dL (ref 3.8–4.8)
Alkaline Phosphatase: 87 IU/L (ref 39–117)
BUN/Creatinine Ratio: 11 (ref 9–23)
BUN: 10 mg/dL (ref 6–24)
Bilirubin Total: 0.3 mg/dL (ref 0.0–1.2)
CO2: 22 mmol/L (ref 20–29)
Calcium: 9.2 mg/dL (ref 8.7–10.2)
Chloride: 102 mmol/L (ref 96–106)
Creatinine, Ser: 0.91 mg/dL (ref 0.57–1.00)
GFR calc Af Amer: 91 mL/min/{1.73_m2} (ref >59)
GFR calc non Af Amer: 79 mL/min/{1.73_m2} (ref >59)
Globulin, Total: 2.9 g/dL (ref 1.5–4.5)
Glucose: 89 mg/dL (ref 65–99)
Potassium: 4 mmol/L (ref 3.5–5.2)
Sodium: 140 mmol/L (ref 134–144)
Total Protein: 6.9 g/dL (ref 6.0–8.5)

## 2019-08-05 LAB — LIPASE: Lipase: 28 U/L (ref 14–72)

## 2019-08-06 ENCOUNTER — Other Ambulatory Visit: Payer: Self-pay

## 2019-08-06 ENCOUNTER — Encounter (HOSPITAL_COMMUNITY): Payer: Self-pay

## 2019-08-06 ENCOUNTER — Emergency Department (HOSPITAL_COMMUNITY)
Admission: EM | Admit: 2019-08-06 | Discharge: 2019-08-06 | Disposition: A | Discharge status: Home / Self Care | Payer: Self-pay | Attending: Emergency Medicine | Admitting: Emergency Medicine

## 2019-08-06 ENCOUNTER — Emergency Department (HOSPITAL_COMMUNITY): Payer: Self-pay

## 2019-08-06 DIAGNOSIS — R1030 Lower abdominal pain, unspecified: Secondary | ICD-10-CM | POA: Insufficient documentation

## 2019-08-06 DIAGNOSIS — Z87891 Personal history of nicotine dependence: Secondary | ICD-10-CM | POA: Insufficient documentation

## 2019-08-06 DIAGNOSIS — Z79899 Other long term (current) drug therapy: Secondary | ICD-10-CM | POA: Insufficient documentation

## 2019-08-06 LAB — COMPREHENSIVE METABOLIC PANEL
ALT: 87 U/L — ABNORMAL HIGH (ref 0–44)
AST: 56 U/L — ABNORMAL HIGH (ref 15–41)
Albumin: 4.2 g/dL (ref 3.5–5.0)
Alkaline Phosphatase: 83 U/L (ref 38–126)
Anion gap: 10 (ref 5–15)
BUN: 9 mg/dL (ref 6–20)
CO2: 26 mmol/L (ref 22–32)
Calcium: 9.2 mg/dL (ref 8.9–10.3)
Chloride: 103 mmol/L (ref 98–111)
Creatinine, Ser: 0.92 mg/dL (ref 0.44–1.00)
GFR calc Af Amer: >60 mL/min (ref >60)
GFR calc non Af Amer: >60 mL/min (ref >60)
Glucose, Bld: 96 mg/dL (ref 70–99)
Potassium: 4.1 mmol/L (ref 3.5–5.1)
Sodium: 139 mmol/L (ref 135–145)
Total Bilirubin: 0.7 mg/dL (ref 0.3–1.2)
Total Protein: 7.8 g/dL (ref 6.5–8.1)

## 2019-08-06 LAB — URINALYSIS, ROUTINE W REFLEX MICROSCOPIC
Bilirubin Urine: NEGATIVE
Glucose, UA: NEGATIVE mg/dL
Hgb urine dipstick: NEGATIVE
Ketones, ur: NEGATIVE mg/dL
Leukocytes,Ua: NEGATIVE
Nitrite: NEGATIVE
Protein, ur: NEGATIVE mg/dL
Specific Gravity, Urine: 1.021 (ref 1.005–1.030)
pH: 5 (ref 5.0–8.0)

## 2019-08-06 LAB — CBC
HCT: 43.6 % (ref 36.0–46.0)
Hemoglobin: 14.3 g/dL (ref 12.0–15.0)
MCH: 31 pg (ref 26.0–34.0)
MCHC: 32.8 g/dL (ref 30.0–36.0)
MCV: 94.6 fL (ref 80.0–100.0)
Platelets: 287 10*3/uL (ref 150–400)
RBC: 4.61 MIL/uL (ref 3.87–5.11)
RDW: 13.3 % (ref 11.5–15.5)
WBC: 7.8 10*3/uL (ref 4.0–10.5)
nRBC: 0 % (ref 0.0–0.2)

## 2019-08-06 LAB — I-STAT BETA HCG BLOOD, ED (MC, WL, AP ONLY): I-stat hCG, quantitative: <5 m[IU]/mL (ref <5)

## 2019-08-06 LAB — LIPASE, BLOOD: Lipase: 26 U/L (ref 11–51)

## 2019-08-06 MED ORDER — HYDROCODONE-ACETAMINOPHEN 5-325 MG PO TABS: 1.0000 | ORAL_TABLET | Freq: Four times a day (QID) | ORAL | 0 refills | Status: DC | PRN

## 2019-08-06 MED ORDER — SODIUM CHLORIDE (PF) 0.9 % IJ SOLN
INTRAMUSCULAR | Status: AC
  Filled 2019-08-06: qty 50

## 2019-08-06 MED ORDER — IOHEXOL 300 MG/ML  SOLN
100.0000 mL | Freq: Once | INTRAMUSCULAR | Status: AC | PRN
  Administered 2019-08-06: 100 mL via INTRAVENOUS

## 2019-08-06 MED ORDER — ONDANSETRON HCL 4 MG/2ML IJ SOLN
4.0000 mg | Freq: Once | INTRAMUSCULAR | Status: AC
  Administered 2019-08-06: 20:00:00 4 mg via INTRAVENOUS
  Filled 2019-08-06: qty 2

## 2019-08-06 MED ORDER — HYDROMORPHONE HCL 1 MG/ML IJ SOLN
1.0000 mg | Freq: Once | INTRAMUSCULAR | Status: AC
  Administered 2019-08-06: 1 mg via INTRAVENOUS
  Filled 2019-08-06: qty 1

## 2019-08-06 MED ORDER — SODIUM CHLORIDE 0.9% FLUSH: 3.0000 mL | Freq: Once | INTRAVENOUS | Status: DC

## 2019-08-06 NOTE — Discharge Instructions (Addendum)
Patient should follow-up with [facility] gastroenterologist in the next week for recheck abdominal pain

## 2019-08-06 NOTE — ED Triage Notes (Signed)
Patient c/o intermittent left lower abdominal pain that radiates into the left lower back x 1 week. Patient also c/o intermittent nausea. Patient was seen on 08/04/19 for the same symptoms.

## 2019-08-06 NOTE — ED Provider Notes (Signed)
[facility] DEPT Provider Note   CSN: [id number] Arrival date & time: 08/06/19  1815     History Chief Complaint  Patient presents with  . Abdominal Pain    Catherine Koch is a 44 y.o. female.  Patient complains of left lower quadrant abdominal pain.  No vomiting.  The history is provided by the patient. No language interpreter was used.  Abdominal Pain Pain location:  LLQ Pain quality: aching   Pain radiates to:  Does not radiate Pain severity:  Moderate Onset quality:  Sudden Timing:  Constant Progression:  Worsening Chronicity:  New Associated symptoms: no chest pain, no cough, no diarrhea, no fatigue and no hematuria        Past Medical History:  Diagnosis Date  . Anxiety   . Arthritis    pt. states in knees  . Chronic sinus infection   . Closed fracture of right distal fibula 11/19/2013  . Complication of anesthesia    pt. states difficult to wake up  . Depression   . GERD (gastroesophageal reflux disease)   . Headache    pt. states random migraines  . High cholesterol   . History of bronchitis   . History of degenerative disc disease   . Ovarian failure   . Shortness of breath    exertion from crutches  . Sleep apnea    cpap  . UTI (lower urinary tract infection)   . Wears glasses     Patient Active Problem List   Diagnosis Date Noted  . Deviated nasal septum 12/11/2014    Class: Chronic  . Closed fracture of right distal fibula 11/19/2013  . S/P ORIF (open reduction internal fixation) fracture 11/19/2013  . Obesity, morbid ([place]) 06/17/2013  . Chronic rhinitis 06/17/2013  . Acute maxillary sinusitis 06/03/2012  . Obstructive sleep apnea 01/15/2012  . Depression 01/15/2012    Past Surgical History:  Procedure Laterality Date  . CHOLECYSTECTOMY  2010  . CHOLECYSTECTOMY    . ESOPHAGOGASTRODUODENOSCOPY    . NASAL SEPTOPLASTY W/ TURBINOPLASTY Bilateral 12/11/2014   Procedure: NASAL SEPTOPLASTY AND BILATERAL  INFERIOR TURBINATE REDUCTION;  Surgeon: Jerrell Belfast, MD;  Location: [facility];  Service: ENT;  Laterality: Bilateral;  . ORIF FIBULA FRACTURE Right 11/19/2013   Procedure: OPEN REDUCTION INTERNAL FIXATION (ORIF) RIGHT FIBULA FRACTURE;  Surgeon: Johnny Bridge, MD;  Location: [facility];  Service: Orthopedics;  Laterality: Right;  . WISDOM TOOTH EXTRACTION       OB History   No obstetric history on file.     Family History  Problem Relation Age of Onset  . Allergies Sister   . Heart disease Paternal Grandmother   . Cancer Paternal Grandmother        BREAST CANCER  . Cancer Paternal Grandfather     Social History   Tobacco Use  . Smoking status: Former Smoker    Types: Cigarettes    Quit date: 05/24/2010    Years since quitting: 9.2  . Smokeless tobacco: Never Used  . Tobacco comment: Pt. states she used to be a social smoker  Substance Use Topics  . Alcohol use: Not Currently  . Drug use: No    Home Medications Prior to Admission medications   Medication Sig Start Date End Date Taking? Authorizing Provider  cetirizine (ZYRTEC) 10 MG tablet Take 10 mg by mouth at bedtime.   Yes [provider]  DULoxetine (CYMBALTA) 60 MG capsule Take 60 mg by mouth daily.   Yes [provider]  famotidine (PEPCID) 40 MG tablet Take 1 tablet (40 mg total) by mouth daily. 08/04/19  Yes Hall-Potvin, [place], PA-C  hydrOXYzine (ATARAX/VISTARIL) 25 MG tablet Take 25 mg by mouth at bedtime as needed.    Yes [provider]  OVER THE COUNTER MEDICATION Take 1 each by mouth daily.   Yes [provider]  Turmeric (QC TUMERIC COMPLEX PO) Take 1 tablet by mouth daily.   Yes [provider]  fluticasone (FLONASE) 50 MCG/ACT nasal spray Place 1 spray into both nostrils daily. Patient not taking: Reported on 08/06/2019 04/22/17   Caccavale, Sophia, PA-C  HYDROcodone-acetaminophen (NORCO/VICODIN) 5-325 MG tablet Take 1 tablet by mouth every 6 (six) hours as needed for  moderate pain. 08/06/19   Bethann Berkshire, MD  ranitidine (ZANTAC) 150 MG tablet Take 1 tablet (150 mg total) by mouth 2 (two) times daily. 10/25/17 08/04/19  Muthersbaugh, Dahlia Client, PA-C    Allergies    Sulfa antibiotics  Review of Systems   Review of Systems  Constitutional: Negative for appetite change and fatigue.  HENT: Negative for congestion, ear discharge and sinus pressure.   Eyes: Negative for discharge.  Respiratory: Negative for cough.   Cardiovascular: Negative for chest pain.  Gastrointestinal: Positive for abdominal pain. Negative for diarrhea.  Genitourinary: Negative for frequency and hematuria.  Musculoskeletal: Negative for back pain.  Skin: Negative for rash.  Neurological: Negative for seizures and headaches.  Psychiatric/Behavioral: Negative for hallucinations.    Physical Exam Updated Vital Signs BP 133/72   Pulse 71   Temp 98.3 F (36.8 C) (Oral)   Resp 16   Ht 5\' 8"  (1.727 m)   Wt (!) 158.8 kg   LMP 06/29/2013 Comment: waiver signed  SpO2 100%   BMI 53.22 kg/m   Physical Exam Vitals and nursing note reviewed.  Constitutional:      Appearance: She is well-developed.  HENT:     Head: Normocephalic.     Nose: Nose normal.  Eyes:     General: No scleral icterus.    Conjunctiva/sclera: Conjunctivae normal.  Neck:     Thyroid: No thyromegaly.  Cardiovascular:     Rate and Rhythm: Normal rate and regular rhythm.     Heart sounds: No murmur. No friction rub. No gallop.   Pulmonary:     Breath sounds: No stridor. No wheezing or rales.  Chest:     Chest wall: No tenderness.  Abdominal:     General: There is no distension.     Tenderness: There is no abdominal tenderness. There is no rebound.  Musculoskeletal:        General: Normal range of motion.     Cervical back: Neck supple.  Lymphadenopathy:     Cervical: No cervical adenopathy.  Skin:    Findings: No erythema or rash.  Neurological:     Mental Status: She is alert and oriented to  person, place, and time.     Motor: No abnormal muscle tone.     Coordination: Coordination normal.  Psychiatric:        Behavior: Behavior normal.     ED Results / Procedures / Treatments   Labs (all labs ordered are listed, but only abnormal results are displayed) Labs Reviewed  COMPREHENSIVE METABOLIC PANEL - Abnormal; Notable for the following components:      Result Value   AST 56 (*)    ALT 87 (*)    All other components within normal limits  URINALYSIS, ROUTINE W REFLEX MICROSCOPIC -  Abnormal; Notable for the following components:   APPearance HAZY (*)    All other components within normal limits  LIPASE, BLOOD  CBC  I-STAT BETA HCG BLOOD, ED ([facility], [facility], [facility] ONLY)    EKG None  Radiology CT ABDOMEN PELVIS W CONTRAST  Result Date: 08/06/2019 CLINICAL DATA:  Left lower abdominal pain. EXAM: CT ABDOMEN AND PELVIS WITH CONTRAST TECHNIQUE: Multidetector CT imaging of the abdomen and pelvis was performed using the standard protocol following bolus administration of intravenous contrast. CONTRAST:  OMNIPAQUE IOHEXOL 300 MG/ML  SOLN COMPARISON:  August 31, 2016 FINDINGS: Lower chest: No acute abnormality. Hepatobiliary: No focal liver abnormality is seen. Diffuse fatty infiltration of the liver parenchyma is noted. Status post cholecystectomy. No biliary dilatation. Pancreas: Unremarkable. No pancreatic ductal dilatation or surrounding inflammatory changes. Spleen: Normal in size without focal abnormality. Adrenals/Urinary Tract: Adrenal glands are unremarkable. Kidneys are normal, without renal calculi, focal lesion, or hydronephrosis. Bladder is unremarkable. Stomach/Bowel: Stomach is within normal limits. Appendix appears normal. No evidence of bowel wall thickening, distention, or inflammatory changes. Vascular/Lymphatic: No significant vascular findings are present. No enlarged abdominal or pelvic lymph nodes. Reproductive: Uterus and bilateral adnexa are unremarkable. Other: No  abdominal wall hernia or abnormality. No abdominopelvic ascites. Musculoskeletal: No acute or significant osseous findings. IMPRESSION: 1. Diffuse fatty infiltration of the liver. 2. Evidence of prior cholecystectomy. Electronically Signed   By: Aram Candela M.D.   On: 08/06/2019 21:37    Procedures Procedures (including critical care time)  Medications Ordered in ED Medications  sodium chloride flush (NS) 0.9 % injection 3 mL (has no administration in time range)  sodium chloride (PF) 0.9 % injection (has no administration in time range)  HYDROmorphone (DILAUDID) injection 1 mg (1 mg Intravenous Given 08/06/19 1956)  ondansetron (ZOFRAN) injection 4 mg (4 mg Intravenous Given 08/06/19 1956)  iohexol (OMNIPAQUE) 300 MG/ML solution 100 mL (100 mLs Intravenous Contrast Given 08/06/19 2109)    ED Course  I have reviewed the triage vital signs and the nursing notes.  Pertinent labs & imaging results that were available during my care of the patient were reviewed by me and considered in my medical decision making (see chart for details).    MDM Rules/Calculators/A&P                     Tender left lower quadrant.  Labs and CT scan unremarkable.  Patient given pain medicine and referred to GI Final Clinical Impression(s) / ED Diagnoses Final diagnoses:  Lower abdominal pain    Rx / DC Orders ED Discharge Orders         Ordered    HYDROcodone-acetaminophen (NORCO/VICODIN) 5-325 MG tablet  Every 6 hours PRN     08/06/19 2218           Bethann Berkshire, MD 08/06/19 2223

## 2019-08-06 NOTE — ED Notes (Signed)
Pt ambulated to BR with steady gait. Attempting to provide urine sample at this time.

## 2019-08-07 ENCOUNTER — Encounter: Payer: Self-pay | Admitting: Nurse Practitioner

## 2019-08-16 ENCOUNTER — Other Ambulatory Visit: Payer: Self-pay

## 2019-08-16 ENCOUNTER — Encounter: Payer: Self-pay | Admitting: Nurse Practitioner

## 2019-08-16 ENCOUNTER — Telehealth: Payer: Self-pay | Admitting: Nurse Practitioner

## 2019-08-16 ENCOUNTER — Ambulatory Visit: Payer: Self-pay | Admitting: Nurse Practitioner

## 2019-08-16 VITALS — BP 110/80 | HR 71 | Temp 98.3°F | Ht 68.0 in | Wt 346.0 lb

## 2019-08-16 DIAGNOSIS — R1030 Lower abdominal pain, unspecified: Secondary | ICD-10-CM

## 2019-08-16 DIAGNOSIS — R110 Nausea: Secondary | ICD-10-CM

## 2019-08-16 DIAGNOSIS — K5900 Constipation, unspecified: Secondary | ICD-10-CM

## 2019-08-16 DIAGNOSIS — R748 Abnormal levels of other serum enzymes: Secondary | ICD-10-CM

## 2019-08-16 MED ORDER — ONDANSETRON HCL 4 MG PO TABS: 4.0000 mg | ORAL_TABLET | Freq: Three times a day (TID) | ORAL | 1 refills | Status: DC | PRN

## 2019-08-16 MED ORDER — DICYCLOMINE HCL 20 MG PO TABS: 20.0000 mg | ORAL_TABLET | Freq: Two times a day (BID) | ORAL | 1 refills | Status: DC

## 2019-08-16 NOTE — Patient Instructions (Addendum)
If you are age 42 or older, your body mass index should be between 23-30. Your Body mass index is 52.61 kg/m. If this is out of the aforementioned range listed, please consider follow up with your Primary Care Provider.  If you are age 44 or younger, your body mass index should be between 19-25. Your Body mass index is 52.61 kg/m. If this is out of the aformentioned range listed, please consider follow up with your Primary Care Provider.   We have sent the following medications to your pharmacy for you to pick up at your convenience: Zofran 4 mg Dicyclomine 20 mg  Miralax 1 capful daily.  Follow up with me on 08/30/19 at 10 am.  Please see GYN for vaginal bleeding.  Thank you for choosing me and [facility].   Willette Cluster, NP

## 2019-08-16 NOTE — Telephone Encounter (Signed)
Pt just had OV today and inquired whether she should keep her covid vaccine appt scheduled 08/18/19.

## 2019-08-16 NOTE — Telephone Encounter (Signed)
Advised patient to get vaccinated. Okay from a GI standpoint. Other questions should be directed to her PCP.

## 2019-08-16 NOTE — Progress Notes (Signed)
ASSESSMENT / PLAN:   44 year old female with history of obesity, IBS, fatty liver disease  # LLQ pain / bowel changes, unclear etiology. --Unremarkable CT scan and labs --Her bowel movements have slowed down some over the last few weeks so need to exclude constipation as source of pain.  Recommend 64 ounces of water daily.  Start daily MiraLAX --She had some vaginal bleeding last night with last menstrual period being 3 years ago.  I have asked her to follow-up with GYN (she is due for exam anyway) --Trial of dicyclomine twice daily --Follow-up with me in 2 to 3 weeks  #Nausea without vomiting, mild weight loss. Unclear etiology --Unremarkable CT scan and labs --Zofran as needed --Follow-up with me in 2 to 3 weeks.  --Further evaluation pending clinical course --Nauseated before starting opioids described by ED but I explained that narcotics exacerbate nausea   # Elevated liver enzymes, chronic --Fatty liver on CT scan --AST in 40-50 range, ALT in 60- 80s --Will address at her follow-up visit with me.  She will need labs to rule out viral/autoimmune/genetic etiologies of liver disease  HPI:      Chief Complaint: Abdominal pain    Catherine ARENSON is a 44 year old female, new to the practice is self-referred for evaluation of abdominal pain.  She was seen in the ED 08/06/2019 for LLQ.  CMP normal except for AST of 56 and ALT of 87.  CBC normal.  UA unremarkable. CT scan the abdomen pelvis with contrast remarkable for fatty liver and post cholecystectomy.  She says the pain started couple weeks ago, it is mainly in the LLQ.  No fever.  No urinary symptoms.  She did have a small amount of vaginal bleeding last night, her last menstrual cycle was 3 years ago. LLQ pain worse when her bladder is full. It hurts to bend.  Pain also exacerbated by certain foods such as fried foods or sugary foods.  Pain gets a little better after bowel movement.  Patient gives a history of IBS  diagnosed years ago in [place].  She was diagnosed based on symptoms of abdominal pain better with defecation and worsening abdominal pain with certain foods.  Her current symptoms are similar but she says that the LLQ pain is new, she did not have it back when when diagnosed with IBS.  No urinary symptoms.  She did have small amount of vaginal bleeding last night.  She has not had a menstrual cycle in 3 or so years.  Patient typically has 1-2 solid bowel movements a day.  Over the last couple of weeks she has had only 1 bowel movement a day on some days and she is even skipped a few days having a bowel movement.  She gives a history of internal and external hemorrhoids with occasional hemorrhoidal bleeding if passes a hard stool.  Additionally, Breelynn complains of nausea without vomiting.  She reports a 10 pound weight loss over the last several days.  Based on labs in epic her weight is down 4 pounds since 08/06/2019.    Past Medical History:  Diagnosis Date  . Anxiety   . Arthritis    pt. states in knees  . Chronic sinus infection   . Closed fracture of right distal fibula 11/19/2013  . Complication of anesthesia    pt. states difficult to wake up  . Depression   . GERD (gastroesophageal reflux disease)   .  Headache    pt. states random migraines  . High cholesterol   . History of bronchitis   . History of degenerative disc disease   . Ovarian failure   . Shortness of breath    exertion from crutches  . Sleep apnea    cpap  . UTI (lower urinary tract infection)   . Wears glasses      Past Surgical History:  Procedure Laterality Date  . CHOLECYSTECTOMY  2010  . CHOLECYSTECTOMY    . ESOPHAGOGASTRODUODENOSCOPY    . NASAL SEPTOPLASTY W/ TURBINOPLASTY Bilateral 12/11/2014   Procedure: NASAL SEPTOPLASTY AND BILATERAL INFERIOR TURBINATE REDUCTION;  Surgeon: Osborn Coho, MD;  Location: [facility] OR;  Service: ENT;  Laterality: Bilateral;  . ORIF FIBULA FRACTURE Right 11/19/2013    Procedure: OPEN REDUCTION INTERNAL FIXATION (ORIF) RIGHT FIBULA FRACTURE;  Surgeon: Eulas Post, MD;  Location: [facility] OR;  Service: Orthopedics;  Laterality: Right;  . WISDOM TOOTH EXTRACTION     Family History  Problem Relation Age of Onset  . Allergies Sister   . Heart disease Paternal Grandmother   . Cancer Paternal Grandmother        BREAST CANCER  . Cancer Paternal Grandfather    Social History   Tobacco Use  . Smoking status: Former Smoker    Types: Cigarettes    Quit date: 05/24/2010    Years since quitting: 9.2  . Smokeless tobacco: Never Used  . Tobacco comment: Pt. states she used to be a social smoker  Substance Use Topics  . Alcohol use: Not Currently  . Drug use: No   Current Outpatient Medications  Medication Sig Dispense Refill  . cetirizine (ZYRTEC) 10 MG tablet Take 10 mg by mouth at bedtime.    . DULoxetine (CYMBALTA) 60 MG capsule Take 60 mg by mouth daily.    . famotidine (PEPCID) 40 MG tablet Take 1 tablet (40 mg total) by mouth daily. 30 tablet 0  . fluticasone (FLONASE) 50 MCG/ACT nasal spray Place 1 spray into both nostrils daily. 16 g 0  . HYDROcodone-acetaminophen (NORCO/VICODIN) 5-325 MG tablet Take 1 tablet by mouth every 6 (six) hours as needed for moderate pain. 20 tablet 0  . hydrOXYzine (ATARAX/VISTARIL) 25 MG tablet Take 25 mg by mouth at bedtime as needed.     Marland Kitchen OVER THE COUNTER MEDICATION Take 1 each by mouth daily.    . Turmeric (QC TUMERIC COMPLEX PO) Take 1 tablet by mouth daily.     No current facility-administered medications for this visit.   Allergies  Allergen Reactions  . Sulfa Antibiotics Anaphylaxis, Swelling and Rash    Throat swelling/ rashes     Review of Systems: Positive for headaches, fatigue.  All other systems reviewed and negative except where noted in HPI.   Serum creatinine: 0.92 mg/dL 33/54/56 2563 Estimated creatinine clearance: 128.4 mL/min   Physical Exam:    Wt Readings from Last 3 Encounters:  08/16/19  (!) 346 lb (156.9 kg)  08/06/19 (!) 350 lb (158.8 kg)  10/25/17 (!) 319 lb 12.8 oz (145.1 kg)    BP 110/80   Pulse 71   Temp 98.3 F (36.8 C)   Ht 5\' 8"  (1.727 m)   Wt (!) 346 lb (156.9 kg)   LMP 06/29/2013 Comment: waiver signed  BMI 52.61 kg/m  Constitutional:  Pleasant obese female in no acute distress. Psychiatric: Normal mood and affect. Behavior is normal. EENT: Pupils normal.  Conjunctivae are normal. No scleral icterus. Neck supple.  Cardiovascular: Normal  rate, regular rhythm. No edema Pulmonary/chest: Effort normal and breath sounds normal. No wheezing, rales or rhonchi. Abdominal: Soft, nondistended, localized area of tenderness in LLQ.  Bowel sounds active throughout. There are no masses palpable. No hepatomegaly. Neurological: Alert and oriented to person place and time. Skin: Skin is warm and dry. No rashes noted.  Willette Cluster, NP  08/16/2019, 11:26 AM

## 2019-08-17 NOTE — Progress Notes (Signed)
Reviewed and agree with documentation and assessment and plan. K. Veena Verlee Pope , MD   

## 2019-08-30 ENCOUNTER — Other Ambulatory Visit (INDEPENDENT_AMBULATORY_CARE_PROVIDER_SITE_OTHER): Payer: Self-pay

## 2019-08-30 ENCOUNTER — Encounter: Payer: Self-pay | Admitting: Nurse Practitioner

## 2019-08-30 ENCOUNTER — Ambulatory Visit: Payer: Self-pay | Admitting: Nurse Practitioner

## 2019-08-30 VITALS — BP 132/74 | HR 86 | Temp 98.2°F | Ht 68.0 in | Wt 350.0 lb

## 2019-08-30 DIAGNOSIS — R112 Nausea with vomiting, unspecified: Secondary | ICD-10-CM

## 2019-08-30 DIAGNOSIS — R1032 Left lower quadrant pain: Secondary | ICD-10-CM

## 2019-08-30 DIAGNOSIS — R7989 Other specified abnormal findings of blood chemistry: Secondary | ICD-10-CM

## 2019-08-30 DIAGNOSIS — K5900 Constipation, unspecified: Secondary | ICD-10-CM

## 2019-08-30 DIAGNOSIS — K625 Hemorrhage of anus and rectum: Secondary | ICD-10-CM

## 2019-08-30 LAB — IBC + FERRITIN
Ferritin: 69.4 ng/mL (ref 10.0–291.0)
Iron: 55 ug/dL (ref 42–145)
Saturation Ratios: 15.7 % — ABNORMAL LOW (ref 20.0–50.0)
Transferrin: 251 mg/dL (ref 212.0–360.0)

## 2019-08-30 LAB — HEPATIC FUNCTION PANEL
ALT: 56 U/L — ABNORMAL HIGH (ref 0–35)
AST: 31 U/L (ref 0–37)
Albumin: 4.3 g/dL (ref 3.5–5.2)
Alkaline Phosphatase: 80 U/L (ref 39–117)
Bilirubin, Direct: 0.1 mg/dL (ref 0.0–0.3)
Total Bilirubin: 0.5 mg/dL (ref 0.2–1.2)
Total Protein: 7.4 g/dL (ref 6.0–8.3)

## 2019-08-30 LAB — IGA: IgA: 215 mg/dL (ref 68–378)

## 2019-08-30 MED ORDER — NA SULFATE-K SULFATE-MG SULF 17.5-3.13-1.6 GM/177ML PO SOLN: ORAL | 0 refills | Status: DC

## 2019-08-30 NOTE — Patient Instructions (Signed)
If you are age 42 or older, your body mass index should be between 23-30. Your Body mass index is 53.22 kg/m. If this is out of the aforementioned range listed, please consider follow up with your Primary Care Provider.  If you are age 51 or younger, your body mass index should be between 19-25. Your Body mass index is 53.22 kg/m. If this is out of the aformentioned range listed, please consider follow up with your Primary Care Provider.   You have been scheduled for an endoscopy and colonoscopy. Please follow the written instructions given to you at your visit today. Please pick up your prep supplies at the pharmacy within the next 1-3 days. If you use inhalers (even only as needed), please bring them with you on the day of your procedure. Your physician has requested that you go to www.startemmi.com and enter the access code given to you at your visit today. This web site gives a general overview about your procedure. However, you should still follow specific instructions given to you by our office regarding your preparation for the procedure.  We have sent the following medications to your pharmacy for you to pick up at your convenience: Suprep Pick up Zofran and Bentyl.  Your provider has requested that you go to the basement level for lab work before leaving today. Press "B" on the elevator. The lab is located at the first door on the left as you exit the elevator.  Due to recent COVID-19 restrictions implemented by our local and state authorities and in an effort to keep both patients and staff as safe as possible, our hospital system now requires COVID-19 testing prior to any scheduled hospital procedure. Please go to our [facility] location drive thru testing site ([address], [place], [place] [address]) on 10/26/19 at  8:40 AM. There will be multiple testing areas, the first checkpoint being for pre-procedure/surgery testing. Get into the right (yellow) lane that leads to the PAT  testing team. You will not be billed at the time of testing but may receive a bill later depending on your insurance. The approximate cost of the test is $100. You must agree to quarantine from the time of your testing until the procedure date on 10/30/19 . This should include staying at home with ONLY the people you live with. Avoid take-out, grocery store shopping or leaving the house for any non-emergent reason. Failure to have your COVID-19 test done on the date and time you have been scheduled will result in cancellation of procedure. Please call our office at [phone] if you have any questions.   Thank you for choosing me and [facility].   Willette Cluster, NP

## 2019-08-30 NOTE — Progress Notes (Signed)
IMPRESSION and PLAN:   44 year old female with history of obesity, IBS, fatty liver disease  #Nausea.  --No weight loss.  Her weight is up actually.  --Negative pregnancy test last month --She did not get the Zofran prescribed at last visit. --We discussed next step.  She can pick up the Zofran and we could continue to monitor her symptoms and pursue EGD if needed.  Patient says she feels bad, would like to go ahead and proceed with EGD though she will pick up the Zofran Rx. The risks and benefits of EGD were discussed and the patient agrees to proceed.   #Constipation / Occasional rectal bleeding, attributed to hemorrhoids. --Response to twice daily MiraLAX was less than I had hoped for but she is at least having a soft bowel movement every day.  --Continue MiraLAX twice daily --Patient probably correct in that the bleeding is secondary to hemorrhoids.  However, given the fairly recent onset of constipation and LLQ pain will proceed with colonoscopy for further evaluation  #LLQ pain --Related to certain foods but also exacerbated by certain movements. --She did not pick up the Bentyl prescribed at last visit.  She will pick up the prescription today --We will see what colonoscopy shows. --She is scheduled to see her GYN at some point  #Abnormal liver chemistries, chronically elevated AST and ALT --Fatty liver disease on imaging --Obtain labs to evaluate for other etiologies of chronic liver disease (metabolic/genetic/viral etiologies)   HPI:    Primary GI: Dr. Lavon Paganini  Chief complaint : follow up on constipation, nausea and abdominal pain   Catherine Koch has been taking MiraLAX twice daily and this has helped some and that she has a bowel movement every day.  Her stools are soft but she still thinks the amount is inadequate.  She reports occasional hemorrhoid bleeding about once a month but this was generally when her stools were hard.  She has intermittent nausea . No  vomiting in weeks. Nauseated many days of the week.  Ginger ale and Sprite help.  She did not get make it to the pharmacy to pick up the Zofran prescribed at her last visit.  She completed the course of opioids prescribed by the ED.  Not currently on any pain medication.  No NSAID use.  Negative urine pregnancy test mid-March.  Patient says her GYN has told her that she is an ovarian failure and unable to get pregnant.  Regarding LLQ pain discussed at last visit.  She continues to have the pain, almost on a daily basis.  Eating makes the pain worse especially certain foods such as fried foods and sweets.  Bending over also exacerbates the pain. Defecation does not alleviate the pain.  She did not make it to the pharmacy to pick up Bentyl prescribed at her last visit here.  CT scan of the abdomen and pelvis with contrast for this pain done in ED last month was unrevealing.  she has an appoint with gynecology but not in the near future due to scheduling.   Review of systems:     No chest pain, no SOB, no fevers, no urinary sx   Past Medical History:  Diagnosis Date  . Anxiety   . Arthritis    pt. states in knees  . Chronic sinus infection   . Closed fracture of right distal fibula 11/19/2013  . Complication of anesthesia    pt. states difficult to wake up  . Depression   .  GERD (gastroesophageal reflux disease)   . Headache    pt. states random migraines  . High cholesterol   . History of bronchitis   . History of degenerative disc disease   . Ovarian failure   . Shortness of breath    exertion from crutches  . Sleep apnea    cpap  . UTI (lower urinary tract infection)   . Wears glasses     Patient's surgical history, family medical history, social history, medications and allergies were all reviewed in Epic   Creatinine clearance cannot be calculated (Patient's most recent lab result is older than the maximum 21 days allowed.)  Current Outpatient Medications  Medication Sig Dispense  Refill  . cetirizine (ZYRTEC) 10 MG tablet Take 10 mg by mouth at bedtime.    . DULoxetine (CYMBALTA) 60 MG capsule Take 60 mg by mouth daily.    . famotidine (PEPCID) 40 MG tablet Take 1 tablet (40 mg total) by mouth daily. 30 tablet 0  . fluticasone (FLONASE) 50 MCG/ACT nasal spray Place 1 spray into both nostrils daily. 16 g 0  . hydrOXYzine (ATARAX/VISTARIL) 25 MG tablet Take 25 mg by mouth at bedtime as needed.     Marland Kitchen OVER THE COUNTER MEDICATION Take 1 each by mouth daily.    . Turmeric (QC TUMERIC COMPLEX PO) Take 1 tablet by mouth daily.    Marland Kitchen dicyclomine (BENTYL) 20 MG tablet Take 1 tablet (20 mg total) by mouth in the morning and at bedtime. (Patient not taking: Reported on 08/30/2019) 60 tablet 1  . ondansetron (ZOFRAN) 4 MG tablet Take 1 tablet (4 mg total) by mouth every 8 (eight) hours as needed for nausea or vomiting. (Patient not taking: Reported on 08/30/2019) 30 tablet 1   No current facility-administered medications for this visit.    Physical Exam:     BP 132/74   Pulse 86   Temp 98.2 F (36.8 C)   Ht 5\' 8"  (1.727 m)   Wt (!) 350 lb (158.8 kg)   LMP 06/29/2013 Comment: waiver signed  BMI 53.22 kg/m   GENERAL:  Pleasant female in NAD PSYCH: : Cooperative, normal affect CARDIAC:  RRR, no murmur heard, no peripheral edema PULM: Normal respiratory effort, lungs CTA bilaterally, no wheezing ABDOMEN:  Nondistended, soft, nontender. No obvious masses, no hepatomegaly,  normal bowel sounds SKIN:  turgor, no lesions seen Musculoskeletal:  Normal muscle tone, normal strength NEURO: Alert and oriented x 3, no focal neurologic deficits   Tye Savoy , NP 08/30/2019, 10:09 AM

## 2019-09-05 LAB — HEPATITIS C ANTIBODY
Hepatitis C Ab: NONREACTIVE
SIGNAL TO CUT-OFF: 0.01 (ref <1.00)

## 2019-09-05 LAB — ANTI-SMOOTH MUSCLE ANTIBODY, IGG: Actin (Smooth Muscle) Antibody (IGG): <20 U (ref <20)

## 2019-09-05 LAB — TISSUE TRANSGLUTAMINASE, IGA: (tTG) Ab, IgA: 1 U/mL

## 2019-09-05 LAB — CERULOPLASMIN: Ceruloplasmin: 34 mg/dL (ref 18–53)

## 2019-09-05 LAB — HEPATITIS A ANTIBODY, TOTAL: Hepatitis A AB,Total: NONREACTIVE

## 2019-09-05 LAB — ALPHA-1-ANTITRYPSIN: A-1 Antitrypsin, Ser: 120 mg/dL (ref 83–199)

## 2019-09-05 LAB — ANA: Anti Nuclear Antibody (ANA): NEGATIVE

## 2019-09-05 LAB — HEPATITIS B SURFACE ANTIBODY,QUALITATIVE: Hep B S Ab: REACTIVE — AB

## 2019-09-05 LAB — MITOCHONDRIAL ANTIBODIES: Mitochondrial M2 Ab, IgG: <=20 U

## 2019-09-11 NOTE — Progress Notes (Signed)
Reviewed and agree with documentation and assessment and plan. K. Veena Saphyra Hutt , MD   

## 2019-10-26 ENCOUNTER — Telehealth: Payer: Self-pay | Admitting: Gastroenterology

## 2019-10-26 ENCOUNTER — Other Ambulatory Visit: Payer: Self-pay

## 2019-10-26 ENCOUNTER — Other Ambulatory Visit (HOSPITAL_COMMUNITY)
Admission: RE | Admit: 2019-10-26 | Discharge: 2019-10-26 | Disposition: A | Discharge status: Home / Self Care | Payer: HRSA Program | Source: Ambulatory Visit | Attending: Gastroenterology | Admitting: Gastroenterology

## 2019-10-26 DIAGNOSIS — K5900 Constipation, unspecified: Secondary | ICD-10-CM

## 2019-10-26 DIAGNOSIS — Z20822 Contact with and (suspected) exposure to covid-19: Secondary | ICD-10-CM | POA: Diagnosis not present

## 2019-10-26 DIAGNOSIS — R1030 Lower abdominal pain, unspecified: Secondary | ICD-10-CM

## 2019-10-26 DIAGNOSIS — K625 Hemorrhage of anus and rectum: Secondary | ICD-10-CM

## 2019-10-26 DIAGNOSIS — Z01812 Encounter for preprocedural laboratory examination: Secondary | ICD-10-CM | POA: Diagnosis present

## 2019-10-26 LAB — SARS CORONAVIRUS 2 (TAT 6-24 HRS): SARS Coronavirus 2: NEGATIVE

## 2019-10-26 NOTE — Telephone Encounter (Signed)
Spoke with the patient. She has family matters and will not be able to isolate. She had her COVID test today. Understands this will have to be repeated closer to her new procedure date.  New instructions mailed to the patient.

## 2019-11-15 ENCOUNTER — Ambulatory Visit (INDEPENDENT_AMBULATORY_CARE_PROVIDER_SITE_OTHER): Payer: No Payment, Other | Admitting: Licensed Clinical Social Worker

## 2019-11-15 ENCOUNTER — Other Ambulatory Visit: Payer: Self-pay

## 2019-11-15 DIAGNOSIS — F4310 Post-traumatic stress disorder, unspecified: Secondary | ICD-10-CM
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
## 2019-12-04 ENCOUNTER — Ambulatory Visit (INDEPENDENT_AMBULATORY_CARE_PROVIDER_SITE_OTHER): Payer: No Payment, Other | Admitting: Licensed Clinical Social Worker

## 2019-12-04 ENCOUNTER — Other Ambulatory Visit: Payer: Self-pay

## 2019-12-04 DIAGNOSIS — F4310 Post-traumatic stress disorder, unspecified: Secondary | ICD-10-CM | POA: Diagnosis not present

## 2019-12-05 NOTE — Progress Notes (Signed)
   THERAPIST PROGRESS NOTE  Session Time: 50 min  Participation Level: Active  Behavioral Response: CasualAlertAnxious and Depressed  Type of Therapy: Individual Therapy  Treatment Goals addressed: Anxiety and Coping  Interventions: Supportive and Other: Assessment  Summary: Catherine Koch is a 44 y.o. female who presents with hx of PTSD with anx/dep. Pt reports she is struggling with much fatigue. She reports ongoing physical c/o and now has an issue with unexplained R shoulder pain. Has her upper and lower GI endoscopies 7/19 which presents some anx. Pt feels tense, reports a hx of involuntarily picking at her skin until it bleeds without really being aware when more anx. Has not started this but knows she could. States she used to pull her eyebrows out but has not done that in some time. Pt states "I feel foggy headed". She reports being off her depression meds about a wk d/t finances. She has started them back. Has also had to start a HTN med after a physical and wonders about any reaction. She will f/u with PCP. Pt believes she has been approved for Medicaid but not sure. Provided guidance on verification. Pt does not have disability. Assessed for self isolation and anything pt has done pleasant outside of the home since initial session. Pt reports going out to dinner, going to the [employer] event, went to [place] to see friends/fam, Had friends over and played Dungeons&Dragons. LCSW praised activity and encouraged ongoing positive distractions. Spouse remains supportive. Assessed for coping skills, strategies. Pt reports when she is depressed she stays in bed, when she is anxious playing electronic games can help. 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  Pt states she has never had any training in relaxation techniques. LCSW reviewed poc with pt verbal acceptance prior to close of session. Pt  states appreciation for care.      Suicidal/Homicidal: Nowithout intent/plan  Therapist Response: Remains receptive to care. Teaching on guided imagery next session.  Plan: Return again in 2 weeks.  Diagnosis: Axis I: Post Traumatic Stress Disorder    Axis II: Deferred  Damascus Sink, LCSW 12/05/2019

## 2019-12-06 ENCOUNTER — Other Ambulatory Visit (HOSPITAL_COMMUNITY)
Admission: RE | Admit: 2019-12-06 | Discharge: 2019-12-06 | Disposition: A | Discharge status: Home / Self Care | Payer: HRSA Program | Source: Ambulatory Visit | Attending: Gastroenterology | Admitting: Gastroenterology

## 2019-12-06 DIAGNOSIS — Z20822 Contact with and (suspected) exposure to covid-19: Secondary | ICD-10-CM | POA: Diagnosis present

## 2019-12-06 LAB — SARS CORONAVIRUS 2 (TAT 6-24 HRS): SARS Coronavirus 2: NEGATIVE

## 2019-12-09 ENCOUNTER — Encounter (HOSPITAL_COMMUNITY): Payer: Self-pay | Admitting: Gastroenterology

## 2019-12-09 NOTE — Anesthesia Preprocedure Evaluation (Addendum)
Anesthesia Evaluation  Patient identified by MRN, date of birth, ID band Patient awake    Reviewed: Allergy & Precautions, NPO status , Patient's Chart, lab work & pertinent test results  History of Anesthesia Complications (+) history of anesthetic complications  Airway Mallampati: II  TM Distance: >3 FB Neck ROM: Full    Dental no notable dental hx.    Pulmonary shortness of breath and with exertion, sleep apnea and Continuous Positive Airway Pressure Ventilation , former smoker,  Deviated nasal septum   Pulmonary exam normal breath sounds clear to auscultation       Cardiovascular Normal cardiovascular exam Rhythm:Regular Rate:Normal     Neuro/Psych  Headaches, PSYCHIATRIC DISORDERS Anxiety Depression    GI/Hepatic GERD  Medicated and Controlled,Elevated LFT's LLQ pain Rectal bleeding Nausea    Endo/Other  Morbid obesityHyperlipidemia  Renal/GU negative Renal ROS  negative genitourinary   Musculoskeletal  (+) Arthritis , Osteoarthritis,  DDD   Abdominal (+) + obese,   Peds  Hematology   Anesthesia Other Findings   Reproductive/Obstetrics                           Anesthesia Physical Anesthesia Plan  ASA: III  Anesthesia Plan: MAC   Post-op Pain Management:    Induction:   PONV Risk Score and Plan: 2 and Propofol infusion, Ondansetron and Treatment may vary due to age or medical condition  Airway Management Planned: Natural Airway and Nasal Cannula  Additional Equipment:   Intra-op Plan:   Post-operative Plan:   Informed Consent: I have reviewed the patients History and Physical, chart, labs and discussed the procedure including the risks, benefits and alternatives for the proposed anesthesia with the patient or authorized representative who has indicated his/her understanding and acceptance.       Plan Discussed with: CRNA and Anesthesiologist  Anesthesia Plan  Comments:        Anesthesia Quick Evaluation

## 2019-12-10 ENCOUNTER — Ambulatory Visit (HOSPITAL_COMMUNITY): Payer: Self-pay | Admitting: Anesthesiology

## 2019-12-10 ENCOUNTER — Encounter (HOSPITAL_COMMUNITY)
Admission: RE | Disposition: A | Discharge status: Home / Self Care | Payer: Self-pay | Source: Home / Self Care | Attending: Gastroenterology

## 2019-12-10 ENCOUNTER — Encounter (HOSPITAL_COMMUNITY): Payer: Self-pay | Admitting: Gastroenterology

## 2019-12-10 ENCOUNTER — Ambulatory Visit (HOSPITAL_COMMUNITY)
Admission: RE | Admit: 2019-12-10 | Discharge: 2019-12-10 | Disposition: A | Discharge status: Home / Self Care | Payer: Self-pay | Attending: Gastroenterology | Admitting: Gastroenterology

## 2019-12-10 ENCOUNTER — Other Ambulatory Visit: Payer: Self-pay

## 2019-12-10 DIAGNOSIS — R1032 Left lower quadrant pain: Secondary | ICD-10-CM | POA: Insufficient documentation

## 2019-12-10 DIAGNOSIS — K2960 Other gastritis without bleeding: Secondary | ICD-10-CM

## 2019-12-10 DIAGNOSIS — G43909 Migraine, unspecified, not intractable, without status migrainosus: Secondary | ICD-10-CM | POA: Insufficient documentation

## 2019-12-10 DIAGNOSIS — R7989 Other specified abnormal findings of blood chemistry: Secondary | ICD-10-CM | POA: Insufficient documentation

## 2019-12-10 DIAGNOSIS — M170 Bilateral primary osteoarthritis of knee: Secondary | ICD-10-CM | POA: Insufficient documentation

## 2019-12-10 DIAGNOSIS — K922 Gastrointestinal hemorrhage, unspecified: Secondary | ICD-10-CM

## 2019-12-10 DIAGNOSIS — R1012 Left upper quadrant pain: Secondary | ICD-10-CM | POA: Insufficient documentation

## 2019-12-10 DIAGNOSIS — F329 Major depressive disorder, single episode, unspecified: Secondary | ICD-10-CM | POA: Insufficient documentation

## 2019-12-10 DIAGNOSIS — K219 Gastro-esophageal reflux disease without esophagitis: Secondary | ICD-10-CM | POA: Insufficient documentation

## 2019-12-10 DIAGNOSIS — Z6841 Body Mass Index (BMI) 40.0 and over, adult: Secondary | ICD-10-CM | POA: Insufficient documentation

## 2019-12-10 DIAGNOSIS — R1013 Epigastric pain: Secondary | ICD-10-CM | POA: Insufficient documentation

## 2019-12-10 DIAGNOSIS — K2950 Unspecified chronic gastritis without bleeding: Secondary | ICD-10-CM | POA: Insufficient documentation

## 2019-12-10 DIAGNOSIS — K2970 Gastritis, unspecified, without bleeding: Secondary | ICD-10-CM

## 2019-12-10 DIAGNOSIS — R110 Nausea: Secondary | ICD-10-CM | POA: Insufficient documentation

## 2019-12-10 DIAGNOSIS — Z87891 Personal history of nicotine dependence: Secondary | ICD-10-CM | POA: Insufficient documentation

## 2019-12-10 DIAGNOSIS — K648 Other hemorrhoids: Secondary | ICD-10-CM | POA: Insufficient documentation

## 2019-12-10 DIAGNOSIS — K625 Hemorrhage of anus and rectum: Secondary | ICD-10-CM

## 2019-12-10 DIAGNOSIS — G4730 Sleep apnea, unspecified: Secondary | ICD-10-CM | POA: Insufficient documentation

## 2019-12-10 DIAGNOSIS — Z79899 Other long term (current) drug therapy: Secondary | ICD-10-CM | POA: Insufficient documentation

## 2019-12-10 DIAGNOSIS — F419 Anxiety disorder, unspecified: Secondary | ICD-10-CM | POA: Insufficient documentation

## 2019-12-10 DIAGNOSIS — E7800 Pure hypercholesterolemia, unspecified: Secondary | ICD-10-CM | POA: Insufficient documentation

## 2019-12-10 HISTORY — PX: ESOPHAGOGASTRODUODENOSCOPY (EGD) WITH PROPOFOL: SHX5813

## 2019-12-10 HISTORY — PX: BIOPSY: SHX5522

## 2019-12-10 HISTORY — PX: COLONOSCOPY WITH PROPOFOL: SHX5780

## 2019-12-10 SURGERY — COLONOSCOPY WITH PROPOFOL
Anesthesia: Monitor Anesthesia Care

## 2019-12-10 MED ORDER — LACTATED RINGERS IV SOLN: INTRAVENOUS | Status: DC

## 2019-12-10 MED ORDER — LIDOCAINE 2% (20 MG/ML) 5 ML SYRINGE
INTRAMUSCULAR | Status: DC | PRN
  Administered 2019-12-10: 100 mg via INTRAVENOUS

## 2019-12-10 MED ORDER — PROPOFOL 10 MG/ML IV BOLUS
INTRAVENOUS | Status: AC
  Filled 2019-12-10: qty 20

## 2019-12-10 MED ORDER — PROPOFOL 500 MG/50ML IV EMUL
INTRAVENOUS | Status: DC | PRN
  Administered 2019-12-10: 200 ug/kg/min via INTRAVENOUS
  Administered 2019-12-10: 70 mg via INTRAVENOUS
  Administered 2019-12-10: 30 mg via INTRAVENOUS

## 2019-12-10 MED ORDER — OMEPRAZOLE 40 MG PO CPDR: 40.0000 mg | DELAYED_RELEASE_CAPSULE | Freq: Every day | ORAL | 3 refills | Status: DC

## 2019-12-10 SURGICAL SUPPLY — 24 items

## 2019-12-10 NOTE — Transfer of Care (Signed)
Immediate Anesthesia Transfer of Care Note  Patient: Catherine Koch  Procedure(s) Performed: COLONOSCOPY WITH PROPOFOL (N/A ) ESOPHAGOGASTRODUODENOSCOPY (EGD) WITH PROPOFOL (N/A ) BIOPSY  Patient Location: PACU  Anesthesia Type:MAC  Level of Consciousness: sedated  Airway & Oxygen Therapy: Patient Spontanous Breathing and Patient connected to face mask oxygen  Post-op Assessment: Report given to RN and Post -op Vital signs reviewed and stable  Post vital signs: Reviewed and stable  Last Vitals:  Vitals Value Taken Time  BP    Temp    Pulse    Resp    SpO2      Last Pain:  Vitals:   12/10/19 1051  TempSrc: Oral  PainSc: 0-No pain         Complications: No complications documented.

## 2019-12-10 NOTE — Anesthesia Postprocedure Evaluation (Signed)
Anesthesia Post Note  Patient: [name]  Procedure(s) Performed: COLONOSCOPY WITH PROPOFOL (N/A ) ESOPHAGOGASTRODUODENOSCOPY (EGD) WITH PROPOFOL (N/A ) BIOPSY     Patient location during evaluation: PACU Anesthesia Type: MAC Level of consciousness: awake and alert and oriented Pain management: pain level controlled Vital Signs Assessment: post-procedure vital signs reviewed and stable Respiratory status: spontaneous breathing, nonlabored ventilation and respiratory function stable Cardiovascular status: stable and blood pressure returned to baseline Postop Assessment: no apparent nausea or vomiting Anesthetic complications: no   No complications documented.  Last Vitals:  Vitals:   12/10/19 1200 12/10/19 1210  BP: (!) 144/65 (!) 144/80  Pulse:  62  Resp: 17 (!) 23  Temp:    SpO2: 98% 100%    Last Pain:  Vitals:   12/10/19 1210  TempSrc:   PainSc: 0-No pain                 Zubin Pontillo A.

## 2019-12-10 NOTE — Anesthesia Procedure Notes (Signed)
Procedure Name: MAC Date/Time: 12/10/2019 11:08 AM Performed by: Deliah Boston, CRNA Pre-anesthesia Checklist: Patient identified, Emergency Drugs available, Suction available and Patient being monitored Patient Re-evaluated:Patient Re-evaluated prior to induction Oxygen Delivery Method: Simple face mask Preoxygenation: Pre-oxygenation with 100% oxygen Placement Confirmation: positive ETCO2 and breath sounds checked- equal and bilateral

## 2019-12-10 NOTE — H&P (Signed)
[facility] History and Physical   Primary Care Physician:  Patient, No Pcp Per   Reason for Procedure:   LLQ abd pain, rectal bleeding, Nausea, GERD  Plan:    EGD and colonoscopy with possible intervention     HPI: Catherine Koch is a 44 y.o. female here with h/o morbid obesity BMI >50 here for EGD and colonoscopy for evaluation of rectal bleeding, LLQ abd pain and nausea  The risks and benefits as well as alternatives of endoscopic procedure(s) have been discussed and reviewed. All questions answered. The patient agrees to proceed.    Past Medical History:  Diagnosis Date  . Anxiety   . Arthritis    pt. states in knees  . Chronic sinus infection   . Closed fracture of right distal fibula 11/19/2013  . Complication of anesthesia    pt. states difficult to wake up  . Depression   . GERD (gastroesophageal reflux disease)   . Headache    pt. states random migraines  . High cholesterol   . History of bronchitis   . History of degenerative disc disease   . Ovarian failure   . Shortness of breath    exertion from crutches  . Sleep apnea    cpap  . UTI (lower urinary tract infection)   . Wears glasses     Past Surgical History:  Procedure Laterality Date  . CHOLECYSTECTOMY  2010  . CHOLECYSTECTOMY    . ESOPHAGOGASTRODUODENOSCOPY    . NASAL SEPTOPLASTY W/ TURBINOPLASTY Bilateral 12/11/2014   Procedure: NASAL SEPTOPLASTY AND BILATERAL INFERIOR TURBINATE REDUCTION;  Surgeon: Osborn Coho, MD;  Location: [facility] OR;  Service: ENT;  Laterality: Bilateral;  . ORIF FIBULA FRACTURE Right 11/19/2013   Procedure: OPEN REDUCTION INTERNAL FIXATION (ORIF) RIGHT FIBULA FRACTURE;  Surgeon: Eulas Post, MD;  Location: [facility] OR;  Service: Orthopedics;  Laterality: Right;  . WISDOM TOOTH EXTRACTION      Prior to Admission medications   Medication Sig Start Date End Date Taking? Authorizing Provider  cetirizine (ZYRTEC) 10 MG tablet Take 10 mg by mouth at bedtime.   Yes  [provider]  DULoxetine (CYMBALTA) 60 MG capsule Take 60 mg by mouth daily.   Yes [provider]  famotidine (PEPCID) 40 MG tablet Take 1 tablet (40 mg total) by mouth daily. 08/04/19  Yes Hall-Potvin, [place], PA-C  fluticasone (FLONASE) 50 MCG/ACT nasal spray Place 1 spray into both nostrils daily. Patient taking differently: Place 1 spray into both nostrils daily as needed for allergies.  04/22/17  Yes Caccavale, Sophia, PA-C  hydrOXYzine (ATARAX/VISTARIL) 25 MG tablet Take 25 mg by mouth at bedtime.    Yes [provider]  Na Sulfate-K Sulfate-Mg Sulf 17.5-3.13-1.6 GM/177ML SOLN Suprep-Use as directed 08/30/19  Yes Meredith Pel, NP  ondansetron (ZOFRAN) 4 MG tablet Take 1 tablet (4 mg total) by mouth every 8 (eight) hours as needed for nausea or vomiting. 08/16/19  Yes Meredith Pel, NP  traZODone (DESYREL) 50 MG tablet Take 50 mg by mouth at bedtime as needed for sleep.   Yes [provider]  Turmeric (QC TUMERIC COMPLEX PO) Take 1 tablet by mouth daily.   Yes [provider]  dicyclomine (BENTYL) 20 MG tablet Take 1 tablet (20 mg total) by mouth in the morning and at bedtime. Patient not taking: Reported on 08/30/2019 08/16/19   Meredith Pel, NP  ranitidine (ZANTAC) 150 MG tablet Take 1 tablet (150 mg total) by mouth 2 (two) times  daily. 10/25/17 08/04/19  Muthersbaugh, Dahlia Client, PA-C    No current facility-administered medications for this encounter.    Allergies as of 08/30/2019 - Review Complete 08/30/2019  Allergen Reaction Noted  . Sulfa antibiotics Anaphylaxis, Swelling, and Rash 05/26/2011    Family History  Problem Relation Age of Onset  . Allergies Sister   . Heart disease Paternal Grandmother   . Cancer Paternal Grandmother        BREAST CANCER  . Cancer Paternal Grandfather     Social History   Socioeconomic History  . Marital status: Married    Spouse name: Not on file  . Number of children: Not on file  .  Years of education: Not on file  . Highest education level: Not on file  Occupational History    Comment: UNEMPOLYEED   Tobacco Use  . Smoking status: Former Smoker    Types: Cigarettes    Quit date: 05/24/2010    Years since quitting: 9.5  . Smokeless tobacco: Never Used  . Tobacco comment: Pt. states she used to be a social smoker  Vaping Use  . Vaping Use: Never used  Substance and Sexual Activity  . Alcohol use: Not Currently  . Drug use: No  . Sexual activity: Yes    Birth control/protection: None  Other Topics Concern  . Not on file  Social History Narrative  . Not on file   Social Determinants of Health   Financial Resource Strain:   . Difficulty of Paying Living Expenses:   Food Insecurity:   . Worried About Programme researcher, broadcasting/film/video in the Last Year:   . Barista in the Last Year:   Transportation Needs:   . Freight forwarder (Medical):   Marland Kitchen Lack of Transportation (Non-Medical):   Physical Activity:   . Days of Exercise per Week:   . Minutes of Exercise per Session:   Stress:   . Feeling of Stress :   Social Connections:   . Frequency of Communication with Friends and Family:   . Frequency of Social Gatherings with Friends and Family:   . Attends Religious Services:   . Active Member of Clubs or Organizations:   . Attends Banker Meetings:   Marland Kitchen Marital Status:   Intimate Partner Violence:   . Fear of Current or Ex-Partner:   . Emotionally Abused:   Marland Kitchen Physically Abused:   . Sexually Abused:     Review of Systems:  All other review of systems negative except as mentioned in the HPI.  Physical Exam: Vital signs in last 24 hours:     General:   Alert, pleasant and cooperative in NAD Lungs:  Clear throughout to auscultation.   Heart:  Regular rate and rhythm; no murmurs, clicks, rubs,  or gallops. Abdomen:  Soft, nontender and nondistended. Normal bowel sounds.   Neuro/Psych:  Alert and cooperative. Normal mood and affect. A and O x  3   K. Scherry Ran , MD [phone]

## 2019-12-10 NOTE — Op Note (Signed)
[facility] Patient Name: Catherine Koch Procedure Date: 12/10/2019 MRN: [id number] Attending MD: Napoleon Form , MD Date of Birth: 06/02/1975 CSN: [id number] Age: 44 Admit Type: Outpatient Procedure:                Colonoscopy Indications:              Abdominal pain in the left lower quadrant,                            intermittent bright red blood per rectum Providers:                Napoleon Form, MD, Norman Clay, RN, Arlee Muslim Tech., Technician, Randon Goldsmith, CRNA Referring MD:              Medicines:                Monitored Anesthesia Care Complications:            No immediate complications. Estimated Blood Loss:     Estimated blood loss: none. Procedure:                Pre-Anesthesia Assessment:                           - Prior to the procedure, a History and Physical                            was performed, and patient medications and                            allergies were reviewed. The patient's tolerance of                            previous anesthesia was also reviewed. The risks                            and benefits of the procedure and the sedation                            options and risks were discussed with the patient.                            All questions were answered, and informed consent                            was obtained. Prior Anticoagulants: The patient has                            taken no previous anticoagulant or antiplatelet                            agents. ASA Grade Assessment: III - A patient with  severe systemic disease. After reviewing the risks                            and benefits, the patient was deemed in                            satisfactory condition to undergo the procedure.                           After obtaining informed consent, the colonoscope                            was passed under direct vision. Throughout the                             procedure, the patient's blood pressure, pulse, and                            oxygen saturations were monitored continuously. The                            PCF-[id number] ([id number] Olympus pediatric colonscope                            was introduced through the anus and advanced to the                            the cecum, identified by appendiceal orifice and                            ileocecal valve. The colonoscopy was performed                            without difficulty. The patient tolerated the                            procedure well. The quality of the bowel                            preparation was good. The ileocecal valve,                            appendiceal orifice, and rectum were photographed. Scope In: 11:19:28 AM Scope Out: 11:35:21 AM Scope Withdrawal Time: 0 hours 7 minutes 49 seconds  Total Procedure Duration: 0 hours 15 minutes 53 seconds  Findings:      The perianal and digital rectal examinations were normal.      Non-bleeding internal hemorrhoids were found during retroflexion. The       hemorrhoids were small.      The exam was otherwise without abnormality. Impression:               - Non-bleeding internal hemorrhoids likely etiology                            of  rectal bleeding.                           - The examination was otherwise normal.                           - No specimens collected. Moderate Sedation:      Not Applicable - Patient had care per Anesthesia. Recommendation:           - Patient has a contact number available for                            emergencies. The signs and symptoms of potential                            delayed complications were discussed with the                            patient. Return to normal activities tomorrow.                            Written discharge instructions were provided to the                            patient.                           - Resume previous diet.                            - Continue present medications.                           - Repeat colonoscopy in 10 years for screening                            purposes.                           - See the other procedure note for documentation of                            additional recommendations. Procedure Code(s):        --- Professional ---                           [phone], Colonoscopy, flexible; diagnostic, including                            collection of specimen(s) by brushing or washing,                            when performed (separate procedure) Diagnosis Code(s):        --- Professional ---                           U72.5, Other hemorrhoids  R10.32, Left lower quadrant pain CPT copyright 2019 American Medical Association. All rights reserved. The codes documented in this report are preliminary and upon coder review may  be revised to meet current compliance requirements. Napoleon Form, MD 12/10/2019 11:45:00 AM This report has been signed electronically. Number of Addenda: 0

## 2019-12-10 NOTE — Discharge Instructions (Signed)
YOU HAD AN ENDOSCOPIC PROCEDURE TODAY: Refer to the procedure report and other information in the discharge instructions given to you for any specific questions about what was found during the examination. If this information does not answer your questions, please call [place] office at [phone] to clarify.  ° °YOU SHOULD EXPECT: Some feelings of bloating in the abdomen. Passage of more gas than usual. Walking can help get rid of the air that was put into your GI tract during the procedure and reduce the bloating. If you had a lower endoscopy (such as a colonoscopy or flexible sigmoidoscopy) you may notice spotting of blood in your stool or on the toilet paper. Some abdominal soreness may be present for a day or two, also. ° °DIET: Your first meal following the procedure should be a light meal and then it is ok to progress to your normal diet. A half-sandwich or bowl of soup is an example of a good first meal. Heavy or fried foods are harder to digest and may make you feel nauseous or bloated. Drink plenty of fluids but you should avoid alcoholic beverages for 24 hours. If you had a esophageal dilation, please see attached instructions for diet.   ° °ACTIVITY: Your care partner should take you home directly after the procedure. You should plan to take it easy, moving slowly for the rest of the day. You can resume normal activity the day after the procedure however YOU SHOULD NOT DRIVE, use power tools, machinery or perform tasks that involve climbing or major physical exertion for 24 hours (because of the sedation medicines used during the test).  ° °SYMPTOMS TO REPORT IMMEDIATELY: °A gastroenterologist can be reached at any hour. Please call [phone]  for any of the following symptoms:  °Following lower endoscopy (colonoscopy, flexible sigmoidoscopy) °Excessive amounts of blood in the stool  °Significant tenderness, worsening of abdominal pains  °Swelling of the abdomen that is new, acute  °Fever of 100° or  higher  °Following upper endoscopy (EGD, EUS, ERCP, esophageal dilation) °Vomiting of blood or coffee ground material  °New, significant abdominal pain  °New, significant chest pain or pain under the shoulder blades  °Painful or persistently difficult swallowing  °New shortness of breath  °Black, tarry-looking or red, bloody stools ° °FOLLOW UP:  °If any biopsies were taken you will be contacted by phone or by letter within the next 1-3 weeks. Call [phone]  if you have not heard about the biopsies in 3 weeks.  °Please also call with any specific questions about appointments or follow up tests. ° °

## 2019-12-10 NOTE — Op Note (Signed)
[facility] Patient Name: Catherine OfficerJanine Gundy Procedure Date: 12/10/2019 MRN: [id number] Attending MD: Napoleon FormKavitha V. Shakora Nordquist , MD Date of Birth: 01/24/1976 CSN: [id number] Age: [id number] Admit Type: Outpatient Procedure:                Upper GI endoscopy Indications:              Abdominal pain in the left upper quadrant,                            Dyspepsia, Nausea Providers:                Napoleon FormKavitha V. Armida Vickroy, MD, Norman ClayLisa Nunn, RN, Arlee Muslimhris                            Chandler Tech., Technician, Randon Goldsmithachel Jones, CRNA Referring MD:              Medicines:                Monitored Anesthesia Care Complications:            No immediate complications. Estimated Blood Loss:     Estimated blood loss was minimal. Procedure:                Pre-Anesthesia Assessment:                           - Prior to the procedure, a History and Physical                            was performed, and patient medications and                            allergies were reviewed. The patient's tolerance of                            previous anesthesia was also reviewed. The risks                            and benefits of the procedure and the sedation                            options and risks were discussed with the patient.                            All questions were answered, and informed consent                            was obtained. Prior Anticoagulants: The patient has                            taken no previous anticoagulant or antiplatelet                            agents. ASA Grade Assessment: III - A patient with  severe systemic disease. After reviewing the risks                            and benefits, the patient was deemed in                            satisfactory condition to undergo the procedure.                           After obtaining informed consent, the endoscope was                            passed under direct vision. Throughout the                             procedure, the patient's blood pressure, pulse, and                            oxygen saturations were monitored continuously. The                            GIF-H190 ([id number]) Olympus gastroscope was                            introduced through the mouth, and advanced to the                            second part of duodenum. The upper GI endoscopy was                            accomplished without difficulty. The patient                            tolerated the procedure well. Scope In: Scope Out: Findings:      The Z-line was regular and was found 37 cm from the incisors.      The esophagus was normal.      Patchy mild inflammation characterized by congestion (edema), erythema       and linear erosions was found in the entire examined stomach. Biopsies       were taken with a cold forceps for Helicobacter pylori testing.      The examined duodenum was normal. Impression:               - Z-line regular, 37 cm from the incisors.                           - Normal esophagus.                           - Erosive gastritis. Biopsied.                           - Normal examined duodenum. Moderate Sedation:      Not Applicable - Patient had care per Anesthesia. Recommendation:           -  Patient has a contact number available for                            emergencies. The signs and symptoms of potential                            delayed complications were discussed with the                            patient. Return to normal activities tomorrow.                            Written discharge instructions were provided to the                            patient.                           - Resume previous diet.                           - Continue present medications.                           - Await pathology results.                           - Follow an antireflux regimen indefinitely.                           - Use Prilosec (omeprazole) 40 mg PO daily for 4-6                             months.                           - Avoid NSAID's Procedure Code(s):        --- Professional ---                           [phone], Esophagogastroduodenoscopy, flexible,                            transoral; with biopsy, single or multiple Diagnosis Code(s):        --- Professional ---                           K29.60, Other gastritis without bleeding                           R10.12, Left upper quadrant pain                           R10.13, Epigastric pain                           R11.0, Nausea CPT copyright 2019 American Medical Association. All rights reserved. The  codes documented in this report are preliminary and upon coder review may  be revised to meet current compliance requirements. Napoleon Form, MD 12/10/2019 11:50:40 AM This report has been signed electronically. Number of Addenda: 0

## 2019-12-11 ENCOUNTER — Encounter (HOSPITAL_COMMUNITY): Payer: Self-pay | Admitting: Gastroenterology

## 2019-12-11 ENCOUNTER — Other Ambulatory Visit: Payer: Self-pay

## 2019-12-11 LAB — SURGICAL PATHOLOGY

## 2019-12-13 ENCOUNTER — Encounter: Payer: Self-pay | Admitting: Gastroenterology

## 2019-12-27 ENCOUNTER — Encounter (HOSPITAL_COMMUNITY): Payer: Self-pay | Admitting: Psychiatry

## 2019-12-27 ENCOUNTER — Other Ambulatory Visit: Payer: Self-pay

## 2019-12-27 ENCOUNTER — Telehealth (INDEPENDENT_AMBULATORY_CARE_PROVIDER_SITE_OTHER): Payer: No Payment, Other | Admitting: Psychiatry

## 2019-12-27 DIAGNOSIS — F330 Major depressive disorder, recurrent, mild: Secondary | ICD-10-CM | POA: Diagnosis not present

## 2019-12-27 MED ORDER — DULOXETINE HCL 60 MG PO CPEP: 60.0000 mg | ORAL_CAPSULE | Freq: Every day | ORAL | 2 refills | Status: DC

## 2019-12-27 MED ORDER — TRAZODONE HCL 50 MG PO TABS: 50.0000 mg | ORAL_TABLET | Freq: Every evening | ORAL | 2 refills | Status: DC | PRN

## 2019-12-27 MED ORDER — HYDROXYZINE HCL 25 MG PO TABS: 25.0000 mg | ORAL_TABLET | Freq: Every day | ORAL | 2 refills | Status: DC
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
## 2019-12-28 ENCOUNTER — Other Ambulatory Visit: Payer: Self-pay

## 2019-12-28 ENCOUNTER — Ambulatory Visit (INDEPENDENT_AMBULATORY_CARE_PROVIDER_SITE_OTHER): Payer: No Payment, Other | Admitting: Licensed Clinical Social Worker

## 2019-12-28 DIAGNOSIS — F418 Other specified anxiety disorders: Secondary | ICD-10-CM

## 2019-12-31 NOTE — Progress Notes (Signed)
   THERAPIST PROGRESS NOTE  Session Time: 45 min  Participation Level: Active  Behavioral Response: CasualAlertDepressed  Type of Therapy: Individual Therapy  Treatment Goals addressed: Coping  Interventions: Motivational Interviewing, Solution Focused and Supportive  Summary: RAFAELLA KOLE is a 44 y.o. female who presents with hx of PTSD/anx/dep. This date pt signs on for a video session. She presents as somewhat depressed. She reports many ongoing physical c/o. She states the result of edoscopies revealed she has chronic/erosive gastritis. B/P still being regulated. Reports spondylosis, plantar fascitis. Her shoulder pain is reportedly dx as bicept tendonitis from repetition. 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 She does this starting between 5-7pm up to 2am. With exploration of these hours of work pt states she is communicating with people overseas as well as [place] time zones so those are the hours she needs to be working. Pt states she is considering applying for disability with all of her new dx added to what she already has. She plans to research disabilty further. LCSW assessed for s&s of anx/dep. Pt reports her fatigue is not as bad and feels her anx/dep are also "less severe". Pt is sleeping 5-8 hrs and admits some of her sleep disturbance has been r/t her cats. She advises she has elminiated them from the bedrm. Pt reports she is still trying to include fun/relaxation. Had plans to see friends last weekend but had to cancel as she was not feeling physically well enough. She states they are supposed to go to a nephew's bday party this coming Wynelle Link at her sister's house in [place]. She is worried about this trip as her sister and family have not been vaccinated. LCSW reviewed coping strategies. Pt states she is bothered that she is unable to do the poetry she used to love to do. She states she would like to get a better  schedule and get more organized. LCSW assisted to process this goal with solution focused options. Pt will get a calendar and will write out a schedule. Discussed good work boundaries and including some exercise in her schedule. Pt feels exercise would need to be aquatic, which she used to do, d/t health. LCSW provided education on breaking a cycle of procrastination/avoidance. Provided education on self guided imagery (calm place stated to be parents cabin at [facility] where she and spouse had their honeymoon as well, married 10/20/14) to add to deep breathing. Pt verbalizes understanding and will implement. LCSW reviewed poc with pt's verbal acceptance prior to close of session. Pt states apprecation for care.      Suicidal/Homicidal: Nowithout intent/plan  Therapist Response: Pt remains receptive to care.  Plan: Return again in ~ 2 weeks.  Diagnosis: Axis I: Depression with Anxiety    Axis II: Deferred  Maywood Sink, LCSW 12/31/2019

## 2020-01-09 ENCOUNTER — Other Ambulatory Visit: Payer: Self-pay

## 2020-01-09 ENCOUNTER — Telehealth: Payer: Self-pay | Admitting: General Practice

## 2020-01-09 ENCOUNTER — Ambulatory Visit (INDEPENDENT_AMBULATORY_CARE_PROVIDER_SITE_OTHER): Payer: Medicaid Other | Admitting: Certified Nurse Midwife

## 2020-01-09 ENCOUNTER — Other Ambulatory Visit (HOSPITAL_COMMUNITY)
Admission: RE | Admit: 2020-01-09 | Discharge: 2020-01-09 | Disposition: A | Discharge status: Home / Self Care | Payer: Medicaid Other | Source: Ambulatory Visit | Attending: Certified Nurse Midwife | Admitting: Certified Nurse Midwife

## 2020-01-09 ENCOUNTER — Encounter: Payer: Self-pay | Admitting: Certified Nurse Midwife

## 2020-01-09 VITALS — BP 136/74 | HR 68 | Temp 98.3°F | Ht 68.0 in | Wt 350.0 lb

## 2020-01-09 DIAGNOSIS — Z308 Encounter for other contraceptive management: Secondary | ICD-10-CM | POA: Diagnosis not present

## 2020-01-09 DIAGNOSIS — Z01419 Encounter for gynecological examination (general) (routine) without abnormal findings: Secondary | ICD-10-CM

## 2020-01-09 NOTE — Progress Notes (Signed)
.    GYNECOLOGY CLINIC ANNUAL PREVENTATIVE CARE ENCOUNTER NOTE  Subjective:   Catherine Koch is a 44 y.o. G0P0000 female here for a routine annual gynecologic exam.  Current complaints: Occasional pain after sitting long periods "near" her vagina - cannot identify where and cannot see to explore the issue. Does not feel like she has a rash, and the pain is not continuous.   Denies abnormal vaginal bleeding, discharge, pelvic pain, is not having much IC due to the occasional (possible) vaginal pain, and has ovarian failure (diagnosed at 44yo) so does not have periods and is not on birth control. No other gynecologic concerns.   Gynecologic History Patient's last menstrual period was 06/29/2013. Contraception: none Last Pap: 07/2015. Results were: normal Last mammogram: None  Obstetric History OB History  Gravida Para Term Preterm AB Living  0 0 0 0 0 0  SAB TAB Ectopic Multiple Live Births  0 0 0 0 0    Past Medical History:  Diagnosis Date  . Anxiety   . Arthritis    pt. states in knees  . Chronic gastritis   . Chronic sinus infection   . Closed fracture of right distal fibula 11/19/2013  . Complication of anesthesia    pt. states difficult to wake up  . Depression   . GERD (gastroesophageal reflux disease)   . Headache    pt. states random migraines  . Hemorrhoids   . High cholesterol   . History of bronchitis   . History of degenerative disc disease   . Hypertension   . Ovarian failure   . PTSD (post-traumatic stress disorder)   . Shortness of breath    exertion from crutches  . Sleep apnea    cpap  . UTI (lower urinary tract infection)   . Wears glasses     Past Surgical History:  Procedure Laterality Date  . BIOPSY  12/10/2019   Procedure: BIOPSY;  Surgeon: Napoleon Form, MD;  Location: [facility];  Service: Endoscopy;;  . CHOLECYSTECTOMY  2010  . CHOLECYSTECTOMY    . COLONOSCOPY WITH PROPOFOL N/A 12/10/2019   Procedure: COLONOSCOPY WITH PROPOFOL;   Surgeon: Napoleon Form, MD;  Location: [facility];  Service: Endoscopy;  Laterality: N/A;  . ESOPHAGOGASTRODUODENOSCOPY    . ESOPHAGOGASTRODUODENOSCOPY (EGD) WITH PROPOFOL N/A 12/10/2019   Procedure: ESOPHAGOGASTRODUODENOSCOPY (EGD) WITH PROPOFOL;  Surgeon: Napoleon Form, MD;  Location: [facility];  Service: Endoscopy;  Laterality: N/A;  . NASAL SEPTOPLASTY W/ TURBINOPLASTY Bilateral 12/11/2014   Procedure: NASAL SEPTOPLASTY AND BILATERAL INFERIOR TURBINATE REDUCTION;  Surgeon: Osborn Coho, MD;  Location: [facility] OR;  Service: ENT;  Laterality: Bilateral;  . ORIF FIBULA FRACTURE Right 11/19/2013   Procedure: OPEN REDUCTION INTERNAL FIXATION (ORIF) RIGHT FIBULA FRACTURE;  Surgeon: Eulas Post, MD;  Location: [facility] OR;  Service: Orthopedics;  Laterality: Right;  . WISDOM TOOTH EXTRACTION      Current Outpatient Medications on File Prior to Visit  Medication Sig Dispense Refill  . cetirizine (ZYRTEC) 10 MG tablet Take 10 mg by mouth at bedtime.    . DULoxetine (CYMBALTA) 60 MG capsule Take 1 capsule (60 mg total) by mouth daily. 30 capsule 2  . fluticasone (FLONASE) 50 MCG/ACT nasal spray Place 1 spray into both nostrils daily. (Patient taking differently: Place 1 spray into both nostrils daily as needed for allergies. ) 16 g 0  . hydrOXYzine (ATARAX/VISTARIL) 25 MG tablet Take 1 tablet (25 mg total) by mouth at bedtime. 30 tablet 2  .  omeprazole (PRILOSEC) 40 MG capsule Take 1 capsule (40 mg total) by mouth daily. 90 capsule 3  . traZODone (DESYREL) 50 MG tablet Take 1 tablet (50 mg total) by mouth at bedtime as needed for sleep. 30 tablet 2  . Turmeric (QC TUMERIC COMPLEX PO) Take 1 tablet by mouth daily.    Marland Kitchen dicyclomine (BENTYL) 20 MG tablet Take 1 tablet (20 mg total) by mouth in the morning and at bedtime. (Patient not taking: Reported on 08/30/2019) 60 tablet 1  . famotidine (PEPCID) 40 MG tablet Take 1 tablet (40 mg total) by mouth daily. (Patient not taking: Reported on  01/09/2020) 30 tablet 0  . Na Sulfate-K Sulfate-Mg Sulf 17.5-3.13-1.6 GM/177ML SOLN Suprep-Use as directed 354 mL 0  . ondansetron (ZOFRAN) 4 MG tablet Take 1 tablet (4 mg total) by mouth every 8 (eight) hours as needed for nausea or vomiting. (Patient not taking: Reported on 01/09/2020) 30 tablet 1  . [DISCONTINUED] ranitidine (ZANTAC) 150 MG tablet Take 1 tablet (150 mg total) by mouth 2 (two) times daily. 60 tablet 0   No current facility-administered medications on file prior to visit.    Allergies  Allergen Reactions  . Sulfa Antibiotics Anaphylaxis, Swelling and Rash    Throat swelling/ rashes    Social History   Socioeconomic History  . Marital status: Married    Spouse name: Not on file  . Number of children: Not on file  . Years of education: Not on file  . Highest education level: Not on file  Occupational History    Comment: UNEMPOLYEED   Tobacco Use  . Smoking status: Former Smoker    Types: Cigarettes    Quit date: 05/24/2010    Years since quitting: 9.6  . Smokeless tobacco: Never Used  . Tobacco comment: Pt. states she used to be a social smoker  Vaping Use  . Vaping Use: Never used  Substance and Sexual Activity  . Alcohol use: Not Currently  . Drug use: No  . Sexual activity: Yes    Birth control/protection: None  Other Topics Concern  . Not on file  Social History Narrative  . Not on file   Social Determinants of Health   Financial Resource Strain:   . Difficulty of Paying Living Expenses:   Food Insecurity:   . Worried About Programme researcher, broadcasting/film/video in the Last Year:   . Barista in the Last Year:   Transportation Needs:   . Freight forwarder (Medical):   Marland Kitchen Lack of Transportation (Non-Medical):   Physical Activity:   . Days of Exercise per Week:   . Minutes of Exercise per Session:   Stress:   . Feeling of Stress :   Social Connections:   . Frequency of Communication with Friends and Family:   . Frequency of Social Gatherings with  Friends and Family:   . Attends Religious Services:   . Active Member of Clubs or Organizations:   . Attends Banker Meetings:   Marland Kitchen Marital Status:   Intimate Partner Violence:   . Fear of Current or Ex-Partner:   . Emotionally Abused:   Marland Kitchen Physically Abused:   . Sexually Abused:     Family History  Problem Relation Age of Onset  . Allergies Sister   . Heart disease Paternal Grandmother   . Cancer Paternal Grandmother        BREAST CANCER  . Cancer Paternal Grandfather     The following portions of the  patient's history were reviewed and updated as appropriate: allergies, current medications, past family history, past medical history, past social history, past surgical history and problem list.  Review of Systems Respiratory: positive for sleep apnea Gastrointestinal: positive for reflux symptoms (being treated)   Objective:  BP 136/74 (BP Location: Left Arm, Patient Position: Sitting, Cuff Size: Large) Comment (Cuff Size): Thigh cuff  Pulse 68   Temp 98.3 F (36.8 C) (Oral)   Ht 5\' 8"  (1.727 m)   Wt (!) 350 lb (158.8 kg)   LMP 06/29/2013 Comment: waiver signed  BMI 53.22 kg/m  CONSTITUTIONAL: Well-developed, morbidly obese female in no acute distress.  HENT:  Normocephalic, atraumatic, External right and left ear normal. Oropharynx is clear and moist EYES: Conjunctivae and EOM are normal. Pupils are equal, round, and reactive to light. No scleral icterus.  NECK: Normal range of motion, supple, no masses.  Normal thyroid.  SKIN: Skin is warm and dry. No rash noted. Not diaphoretic. No erythema. No pallor. NEUROLGIC: Alert and oriented to person, place, and time. Normal reflexes, muscle tone coordination. No cranial nerve deficit noted. PSYCHIATRIC: Normal mood and affect. Normal behavior. Normal judgment and thought content. CARDIOVASCULAR: Normal heart rate noted, regular rhythm RESPIRATORY: Clear to auscultation bilaterally. Effort and breath sounds normal,  no problems with respiration noted. BREASTS: Symmetric in size. No masses, skin changes, nipple drainage, or lymphadenopathy. Very little glandular tissue palpated, mostly (if not all) adipose tissue. Areolas present, no well defined nipples. ABDOMEN: Soft, normal bowel sounds, no distention noted.  No tenderness, rebound or guarding.  PELVIC: Normal appearing external genitalia; normal appearing vaginal mucosa and cervix. No tenderness noted with deeper palpation, no evidence of bartholin cyst or any other abnormality. No abnormal discharge noted.  Pap smear obtained.  Normal uterine size, no other palpable masses, no uterine or adnexal tenderness. MUSCULOSKELETAL: Normal range of motion. No tenderness.  No cyanosis, clubbing, or edema.  2+ distal pulses.   Assessment:  Annual gynecologic examination with pap smear   Plan:  - Will follow up results of pap smear and manage accordingly. - Suggested pain may be related to extended periods of sitting (pt is a gamer, spends hours streaming game content) bc no evidence of infection or edema noted on exam. - Mammogram scholarship paperwork sent - Routine preventative health maintenance measures emphasized. - Please refer to After Visit Summary for other counseling recommendations.  - Follow up in one year for annual gyn exam  08/27/2013, CNM, MSN, [facility] 01/09/20 4:08 PM

## 2020-01-09 NOTE — Patient Instructions (Signed)
Healthy Eating Following a healthy eating pattern may help you to achieve and maintain a healthy body weight, reduce the risk of chronic disease, and live a long and productive life. It is important to follow a healthy eating pattern at an appropriate calorie level for your body. Your nutritional needs should be met primarily through food by choosing a variety of nutrient-rich foods. What are tips for following this plan? Reading food labels  Read labels and choose the following: ? Reduced or low sodium. ? Juices with 100% fruit juice. ? Foods with low saturated fats and high polyunsaturated and monounsaturated fats. ? Foods with whole grains, such as whole wheat, cracked wheat, brown rice, and wild rice. ? Whole grains that are fortified with folic acid. This is recommended for women who are pregnant or who want to become pregnant.  Read labels and avoid the following: ? Foods with a lot of added sugars. These include foods that contain brown sugar, corn sweetener, corn syrup, dextrose, fructose, glucose, high-fructose corn syrup, honey, invert sugar, lactose, malt syrup, maltose, molasses, raw sugar, sucrose, trehalose, or turbinado sugar.  Do not eat more than the following amounts of added sugar per day:  6 teaspoons (25 g) for women.  9 teaspoons (38 g) for men. ? Foods that contain processed or refined starches and grains. ? Refined grain products, such as white flour, degermed cornmeal, white bread, and white rice. Shopping  Choose nutrient-rich snacks, such as vegetables, whole fruits, and nuts. Avoid high-calorie and high-sugar snacks, such as potato chips, fruit snacks, and candy.  Use oil-based dressings and spreads on foods instead of solid fats such as butter, stick margarine, or cream cheese.  Limit pre-made sauces, mixes, and "instant" products such as flavored rice, instant noodles, and ready-made pasta.  Try more plant-protein sources, such as tofu, tempeh, black beans,  edamame, lentils, nuts, and seeds.  Explore eating plans such as the Mediterranean diet or vegetarian diet. Cooking  Use oil to saut or stir-fry foods instead of solid fats such as butter, stick margarine, or lard.  Try baking, boiling, grilling, or broiling instead of frying.  Remove the fatty part of meats before cooking.  Steam vegetables in water or broth. Meal planning   At meals, imagine dividing your plate into fourths: ? One-half of your plate is fruits and vegetables. ? One-fourth of your plate is whole grains. ? One-fourth of your plate is protein, especially lean meats, poultry, eggs, tofu, beans, or nuts.  Include low-fat dairy as part of your daily diet. Lifestyle  Choose healthy options in all settings, including home, work, school, restaurants, or stores.  Prepare your food safely: ? Wash your hands after handling raw meats. ? Keep food preparation surfaces clean by regularly washing with hot, soapy water. ? Keep raw meats separate from ready-to-eat foods, such as fruits and vegetables. ? Cook seafood, meat, poultry, and eggs to the recommended internal temperature. ? Store foods at safe temperatures. In general:  Keep cold foods at 59F (4.4C) or below.  Keep hot foods at 159F (60C) or above.  Keep your freezer at [facility] (-17.8C) or below.  Foods are no longer safe to eat when they have been between the temperatures of 40-159F (4.4-60C) for more than 2 hours. What foods should I eat? Fruits Aim to eat 2 cup-equivalents of fresh, canned (in natural juice), or frozen fruits each day. Examples of 1 cup-equivalent of fruit include 1 small apple, 8 large strawberries, 1 cup canned fruit,  cup  dried fruit, or 1 cup 100% juice. Vegetables Aim to eat 2-3 cup-equivalents of fresh and frozen vegetables each day, including different varieties and colors. Examples of 1 cup-equivalent of vegetables include 2 medium carrots, 2 cups raw, leafy greens, 1 cup chopped  vegetable (raw or cooked), or 1 medium baked potato. Grains Aim to eat 6 ounce-equivalents of whole grains each day. Examples of 1 ounce-equivalent of grains include 1 slice of bread, 1 cup ready-to-eat cereal, 3 cups popcorn, or  cup cooked rice, pasta, or cereal. Meats and other proteins Aim to eat 5-6 ounce-equivalents of protein each day. Examples of 1 ounce-equivalent of protein include 1 egg, 1/2 cup nuts or seeds, or 1 tablespoon (16 g) peanut butter. A cut of meat or fish that is the size of a deck of cards is about 3-4 ounce-equivalents.  Of the protein you eat each week, try to have at least 8 ounces come from seafood. This includes salmon, trout, herring, and anchovies. Dairy Aim to eat 3 cup-equivalents of fat-free or low-fat dairy each day. Examples of 1 cup-equivalent of dairy include 1 cup (240 mL) milk, 8 ounces (250 g) yogurt, 1 ounces (44 g) natural cheese, or 1 cup (240 mL) fortified soy milk. Fats and oils  Aim for about 5 teaspoons (21 g) per day. Choose monounsaturated fats, such as canola and olive oils, avocados, peanut butter, and most nuts, or polyunsaturated fats, such as sunflower, corn, and soybean oils, walnuts, pine nuts, sesame seeds, sunflower seeds, and flaxseed. Beverages  Aim for six 8-oz glasses of water per day. Limit coffee to three to five 8-oz cups per day.  Limit caffeinated beverages that have added calories, such as soda and energy drinks.  Limit alcohol intake to no more than 1 drink a day for nonpregnant women and 2 drinks a day for men. One drink equals 12 oz of beer (355 mL), 5 oz of wine (148 mL), or 1 oz of hard liquor (44 mL). Seasoning and other foods  Avoid adding excess amounts of salt to your foods. Try flavoring foods with herbs and spices instead of salt.  Avoid adding sugar to foods.  Try using oil-based dressings, sauces, and spreads instead of solid fats. This information is based on general [place] nutrition guidelines. For more  information, visit [employer].es. Exact amounts may vary based on your nutrition needs. Summary  A healthy eating plan may help you to maintain a healthy weight, reduce the risk of chronic diseases, and stay active throughout your life.  Plan your meals. Make sure you eat the right portions of a variety of nutrient-rich foods.  Try baking, boiling, grilling, or broiling instead of frying.  Choose healthy options in all settings, including home, work, school, restaurants, or stores. This information is not intended to replace advice given to you by your health care provider. Make sure you discuss any questions you have with your health care provider. Document Revised: 08/22/2017 Document Reviewed: 08/22/2017 Elsevier Patient Education  [place].

## 2020-01-09 NOTE — Telephone Encounter (Signed)
Charity application given to patient. 

## 2020-01-10 LAB — CYTOLOGY - PAP
Comment: NEGATIVE
Diagnosis: NEGATIVE
High risk HPV: NEGATIVE

## 2020-01-11 ENCOUNTER — Other Ambulatory Visit: Payer: Self-pay

## 2020-01-11 ENCOUNTER — Ambulatory Visit (INDEPENDENT_AMBULATORY_CARE_PROVIDER_SITE_OTHER): Payer: No Payment, Other | Admitting: Licensed Clinical Social Worker

## 2020-01-11 DIAGNOSIS — F418 Other specified anxiety disorders: Secondary | ICD-10-CM | POA: Diagnosis not present

## 2020-01-14 NOTE — Progress Notes (Signed)
   THERAPIST PROGRESS NOTE  Session Time: 48 min Virtual Visit via Video Note  I connected with Catherine Koch on 01/14/20 at 12:00 PM EDT by a video enabled telemedicine application and verified that I am speaking with the correct person using two identifiers.  Location: Patient: Home Provider: [facility]   I discussed the limitations of evaluation and management by telemedicine and the availability of in person appointments. The patient expressed understanding and agreed to proceed.  I discussed the assessment and treatment plan with the patient. The patient was provided an opportunity to ask questions and all were answered. The patient agreed with the plan and demonstrated an understanding of the instructions.   I provided 48 minutes of non-face-to-face time during this encounter. Catherine Sink, LCSW    Participation Level: Active  Behavioral Response: CasualAlertAnxious  Type of Therapy: Individual Therapy  Treatment Goals addressed: Anxiety and Coping  Interventions: Solution Focused and Supportive  Summary: Catherine Koch is a 44 y.o. female who presents with hx of dep/anx. This date pt signs on for video session per her preference. Pt states "A lot has happened since I saw you 2 weeks ago". Pt informs this clinician her mil had to be rushed to the hosp shortly after last session. Pt states mil had been doing poorly but continued to neglect getting evaluated. Mil remains in the hosp to date. Mil has been dx with stage 4 cervical CA. Pt states they have not been informed of prognosis but chemo and radiation have been started. LCSW provided education on palliative care. Entire session devoted to the impacts of this change in pt and her spouse's life. They live with mil and mil pays the mortgage. Pt's spouse, Catherine Koch, is an only child. His father died of CA when Catherine Koch in his teens per pt. Catherine Koch's grandfather still living and mil was helping to care for him as he has dementia. Catherine Koch has  two uncles who are helping with his grandfather. Pt and her spouse are distressed, tearful and anxious but trying to support one another. Very worried about finances and how they will manage. Need to address Last Will and Testament along with multiple other important decision making issues. Mil does already have her HCPOA done. LCSW provided support and education on prioritizing decision making while mil is decisional. Pt verbalizes understanding. Pt states she is going to try to get approved to be a paid fam cg and has already started to look into the process. Referral to [employer] with education. 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 Catherine Koch is continuing to work in [employer] at [employer] for now and [employer] processed to be used prn. Possible mil will be coming home tomorrow. LCSW reviewed coping strategies and poc prior to close of session. Pt states appreciation for care.     Suicidal/Homicidal: Nowithout intent/plan  Therapist Response: Pt remains receptive to care.  Plan: Return again in 2 weeks.  Diagnosis: Axis I: Depression with Anxiety    Axis II: Deferred  [place] Sink, LCSW 01/14/2020

## 2020-01-25 ENCOUNTER — Other Ambulatory Visit: Payer: Self-pay

## 2020-01-25 ENCOUNTER — Ambulatory Visit (INDEPENDENT_AMBULATORY_CARE_PROVIDER_SITE_OTHER): Payer: No Payment, Other | Admitting: Licensed Clinical Social Worker

## 2020-01-25 DIAGNOSIS — F418 Other specified anxiety disorders: Secondary | ICD-10-CM | POA: Diagnosis not present

## 2020-01-25 NOTE — Progress Notes (Addendum)
   THERAPIST PROGRESS NOTE  Session Time: 45 min Virtual Visit via Video Note  I connected with Catherine Koch on 01/25/20 at 11:00 AM EDT by a video enabled telemedicine application and verified that I am speaking with the correct person using two identifiers.  Location: Patient: Home Provider: [facility]   I discussed the limitations of evaluation and management by telemedicine and the availability of in person appointments. The patient expressed understanding and agreed to proceed. I discussed the assessment and treatment plan with the patient. The patient was provided an opportunity to ask questions and all were answered. The patient agreed with the plan and demonstrated an understanding of the instructions.  I provided 45 minutes of non-face-to-face time during this encounter.   Participation Level: Active  Behavioral Response: CasualDrowsyDepressed  Type of Therapy: Individual Therapy  Treatment Goals addressed: Coping  Interventions: Motivational Interviewing and Supportive  Summary: Catherine Koch is a 44 y.o. female who presents with hx of dep/anx. Pt signs on for video session per her preference. Pt reports she is just waking up, Cpap marks evident on face. LCSW assessed for status of mil and the health crisis mil having as reported last session. Pt states mil is still in the hospital. She provides details of condition with unmanaged symptoms and new problem r/t kidney failure. Pt states the last time she went to see mil she looked very poorly and pt states "It hit me hard". Assessed for palliative care consult recommended last session. Pt sates "We haven't done that yet". She did not appear to recall much about discussion. LCSW provided additional education and again recommended this for mil's optimal care r/t symptom management. Pt states her husband has a meeting with MD today and she will encourage him to ask about it. Assessed for completion of important documents and  discussions while mil has decisional capacity. Pt states they have been exploring but nothing done. She reports mil does have life ins. She states her spouse will probably call attorney today. LCSW strongly advised pt and her spouse not to procrastinate re these matters d/t the complicated consequences if documents not done and potentially short window of time they could have. Heavily advised Last Will and Testament be completed right away. Pt verbalizes understanding. Assessed for how pt's spouse is coping. She states "He is about like me". She reports everything they are trying to do is "overwhelming". She states emotionally they are trying to prepare themselves that the situation with mil "could go either way". Husband is continuing to work for now but has the day off today to try to rearrange things in the home and do additional cleaning. Friends coming to help again. She reports "I love my mil but she is kind of a hoarder". She provides examples. They have permission from mil to rearrange everything in the home as they feel it needs to be. LCSW reviewed coping strategies and poc including scheduling prior to close of session. Pt states appreciation for care.      Suicidal/Homicidal: Nowithout intent/plan  Therapist Response: Pt remains receptive to care.   Plan: Return again in 1 week.  Diagnosis: Axis I: Depression with Anxiety    Axis II: Deferred  [place] Sink, LCSW 01/25/2020

## 2020-02-01 ENCOUNTER — Other Ambulatory Visit: Payer: Self-pay

## 2020-02-01 ENCOUNTER — Ambulatory Visit (INDEPENDENT_AMBULATORY_CARE_PROVIDER_SITE_OTHER): Payer: No Payment, Other | Admitting: Licensed Clinical Social Worker

## 2020-02-01 DIAGNOSIS — F418 Other specified anxiety disorders: Secondary | ICD-10-CM | POA: Diagnosis not present

## 2020-02-04 NOTE — Progress Notes (Signed)
   THERAPIST PROGRESS NOTE  Session Time: 45 min Virtual Visit via Video Note  I connected with Catherine Koch on 02/01/20 at 11:00 AM EDT by a video enabled telemedicine application and verified that I am speaking with the correct person using two identifiers.  Location: Patient: Home Provider: [facility]   I discussed the limitations of evaluation and management by telemedicine and the availability of in person appointments. The patient expressed understanding and agreed to proceed. I discussed the assessment and treatment plan with the patient. The patient was provided an opportunity to ask questions and all were answered. The patient agreed with the plan and demonstrated an understanding of the instructions.  I provided 45 minutes of non-face-to-face time during this encounter.   Participation Level: Active  Behavioral Response: CasualAlertDepressed  Type of Therapy: Individual Therapy  Treatment Goals addressed: Coping  Interventions: Supportive and Other: Anticipatory grief education and counseling  Summary: Catherine Koch is a 44 y.o. female who presents with hx of dep/anx. Today pt signs on for video session per her preference. Pt reports they have been without air conditioning for 3 days now. Wokers coming with parts today for repair. Pt states there are also workers coming to erradicate black mold found in the closet of the master bedroom. She reports on the process of the home clean up and how mil will not be coming home at this point. She says she has a friend who has moved in, Catherine Koch. Pt states she and spouse will be moving into the master bedroom and Catherine Koch, who is currently sleeping on the sofa bed with move into their room. She advises Catherine Koch will be able to offset some expenses and she is glad she will have a girlfriend close, be less lonely since mil will not be there. LCSW assessed for status of pt's mil. Pt reports mil was moved to inpatient hospice home in  [name] yesterday. She states the chemo was doing nothing to shrink her tumors and she has still been having to be fed via feeding tube d/t vomiting, kidneys continue to be failing. Pt reports everything will be stopped except for comfort measures at hospice home. Pt states her husband is going to see his mother today but she is "scared to see her like that" and thinks she will not go. Pt reports an increase in anx and a couple of "break downs". She states "I don't want to cry in front of her". LCSW assisted pt to process thoughts/feelings and facilitate review of potential consequences/regrets. Pt concludes she will probably go with her husband today. LCSW provided anticipatory grief education/counseling. LCSW reviewed coping skills and poc prior to close of session. Pt states appreciation for care.      Suicidal/Homicidal: Nowithout intent/plan  Therapist Response: Pt remains receptive to care.  Plan: Return again in 2 weeks.  Diagnosis: Axis I: Depression with anxiety    Axis II: Deferred  [place]ear [place] Sink, LCSW 02/04/2020

## 2020-02-21 ENCOUNTER — Ambulatory Visit (HOSPITAL_COMMUNITY): Payer: No Payment, Other | Admitting: Licensed Clinical Social Worker

## 2020-02-21 ENCOUNTER — Other Ambulatory Visit: Payer: Self-pay

## 2020-02-21 ENCOUNTER — Telehealth (HOSPITAL_COMMUNITY): Payer: Self-pay | Admitting: Licensed Clinical Social Worker

## 2020-02-21 NOTE — Telephone Encounter (Signed)
LCSW sent link via text for video session. When pt failed to sign on LCSW called. Pt's phone rang and went to vm. LCSW left detailed message re purpose for call and provided info for next appt.

## 2020-02-26 ENCOUNTER — Other Ambulatory Visit: Payer: Self-pay

## 2020-02-26 ENCOUNTER — Telehealth (INDEPENDENT_AMBULATORY_CARE_PROVIDER_SITE_OTHER): Payer: No Payment, Other | Admitting: Psychiatry

## 2020-02-26 ENCOUNTER — Encounter: Payer: Self-pay | Admitting: Pulmonary Disease

## 2020-02-26 ENCOUNTER — Encounter (HOSPITAL_COMMUNITY): Payer: Self-pay | Admitting: Psychiatry

## 2020-02-26 ENCOUNTER — Ambulatory Visit (INDEPENDENT_AMBULATORY_CARE_PROVIDER_SITE_OTHER): Payer: Medicaid Other | Admitting: Pulmonary Disease

## 2020-02-26 DIAGNOSIS — F330 Major depressive disorder, recurrent, mild: Secondary | ICD-10-CM | POA: Diagnosis not present

## 2020-02-26 DIAGNOSIS — Z23 Encounter for immunization: Secondary | ICD-10-CM

## 2020-02-26 DIAGNOSIS — G4733 Obstructive sleep apnea (adult) (pediatric): Secondary | ICD-10-CM

## 2020-02-26 MED ORDER — TRAZODONE HCL 50 MG PO TABS: 50.0000 mg | ORAL_TABLET | Freq: Every evening | ORAL | 2 refills | Status: DC | PRN

## 2020-02-26 MED ORDER — DULOXETINE HCL 60 MG PO CPEP: 60.0000 mg | ORAL_CAPSULE | Freq: Every day | ORAL | 2 refills | Status: DC

## 2020-02-26 MED ORDER — HYDROXYZINE HCL 25 MG PO TABS: 25.0000 mg | ORAL_TABLET | Freq: Every day | ORAL | 2 refills | Status: DC

## 2020-02-26 NOTE — Progress Notes (Signed)
BH MD/PA/NP OP Progress Note Virtual Visit via Video Note  I connected with Catherine Koch on 02/26/20 at  1:00 PM EDT by a video enabled telemedicine application and verified that I am speaking with the correct person using two identifiers.  Location: Patient: Home Provider: Clinic   I discussed the limitations of evaluation and management by telemedicine and the availability of in person appointments. The patient expressed understanding and agreed to proceed.  I provided 30 minutes of non-face-to-face time during this encounter.    02/26/2020 1:11 PM Catherine Koch  MRN:  [id number]  Chief Complaint: "My mother in law passes away. Lavenia Atlasve been having problems sleeping"  HPI: 44 year old female seen today  for follow-up psychiatric evaluation. She has a history of depression, PTSD,  and anxiety, and is currently managed on Hydroxyzine 25mg  at bedtime, Cymbalta 60mg  daily, and Trazodone 50mg  at bedtime.   On evaluation today, patient is pleasant and cooperative. Her affect is congruent with her mood and her speech is a normal rate and volume. Patient states her depression has worsened. Her mother-in-law recently passed away and she is grieving that loss.  She endorses having strange dreams, insomnia, forgetfulness, restlessness, and fatigue. She denies AVH/SI/HI or poor appetite. Patient has not taken her Trazodone due to her fearing that it will over sedate her. She notes she will pick it up later today to get some rest. She also notes that she ran out of her hydroxyzine and been feeling more anxious.    No medications adjusted at this time. She is agreeable to continue on her current regimen of Cymbalta 60mg  daily, Hydroxyzine 25mg  at bedtime, and Trazodone 50mg  at bedtime. She has a therapy appointment later this month and is agreeable to follow-up with outpatient psychiatry in 3 months. No other concerns noted at this time.   Visit Diagnosis:    ICD-10-CM   1. Mild episode of recurrent  major depressive disorder (HCC)  F33.0 DULoxetine (CYMBALTA) 60 MG capsule    hydrOXYzine (ATARAX/VISTARIL) 25 MG tablet    traZODone (DESYREL) 50 MG tablet    Past Psychiatric History:  PTSD, anxiety, and depression  Past Medical History:  Past Medical History:  Diagnosis Date  . Anxiety   . Arthritis    pt. states in knees  . Chronic gastritis   . Chronic sinus infection   . Closed fracture of right distal fibula 11/19/2013  . Complication of anesthesia    pt. states difficult to wake up  . Depression   . GERD (gastroesophageal reflux disease)   . Headache    pt. states random migraines  . Hemorrhoids   . High cholesterol   . History of bronchitis   . History of degenerative disc disease   . Hypertension   . Ovarian failure   . PTSD (post-traumatic stress disorder)   . Shortness of breath    exertion from crutches  . Sleep apnea    cpap  . UTI (lower urinary tract infection)   . Wears glasses     Past Surgical History:  Procedure Laterality Date  . BIOPSY  12/10/2019   Procedure: BIOPSY;  Surgeon: Napoleon FormNandigam, Kavitha V, MD;  Location: [facility];  Service: Endoscopy;;  . CHOLECYSTECTOMY  2010  . CHOLECYSTECTOMY    . COLONOSCOPY WITH PROPOFOL N/A 12/10/2019   Procedure: COLONOSCOPY WITH PROPOFOL;  Surgeon: Napoleon FormNandigam, Kavitha V, MD;  Location: [facility];  Service: Endoscopy;  Laterality: N/A;  . ESOPHAGOGASTRODUODENOSCOPY    . ESOPHAGOGASTRODUODENOSCOPY (EGD)  WITH PROPOFOL N/A 12/10/2019   Procedure: ESOPHAGOGASTRODUODENOSCOPY (EGD) WITH PROPOFOL;  Surgeon: Napoleon Form, MD;  Location: [facility];  Service: Endoscopy;  Laterality: N/A;  . NASAL SEPTOPLASTY W/ TURBINOPLASTY Bilateral 12/11/2014   Procedure: NASAL SEPTOPLASTY AND BILATERAL INFERIOR TURBINATE REDUCTION;  Surgeon: Osborn Coho, MD;  Location: [facility] OR;  Service: ENT;  Laterality: Bilateral;  . ORIF FIBULA FRACTURE Right 11/19/2013   Procedure: OPEN REDUCTION INTERNAL FIXATION (ORIF) RIGHT FIBULA  FRACTURE;  Surgeon: Eulas Post, MD;  Location: [facility] OR;  Service: Orthopedics;  Laterality: Right;  . WISDOM TOOTH EXTRACTION      Family Psychiatric History: Sister anxiety and depression. Notes mother has untreated mental health conditions  Family History:  Family History  Problem Relation Age of Onset  . Allergies Sister   . Heart disease Paternal Grandmother   . Cancer Paternal Grandmother        BREAST CANCER  . Cancer Paternal Grandfather     Social History:  Social History   Socioeconomic History  . Marital status: Married    Spouse name: Not on file  . Number of children: Not on file  . Years of education: Not on file  . Highest education level: Not on file  Occupational History    Comment: UNEMPOLYEED   Tobacco Use  . Smoking status: Former Smoker    Types: Cigarettes    Quit date: 05/24/2010    Years since quitting: 9.7  . Smokeless tobacco: Never Used  . Tobacco comment: Pt. states she used to be a social smoker  Vaping Use  . Vaping Use: Never used  Substance and Sexual Activity  . Alcohol use: Not Currently  . Drug use: No  . Sexual activity: Yes    Birth control/protection: None  Other Topics Concern  . Not on file  Social History Narrative  . Not on file   Social Determinants of Health   Financial Resource Strain:   . Difficulty of Paying Living Expenses: Not on file  Food Insecurity:   . Worried About Programme researcher, broadcasting/film/video in the Last Year: Not on file  . Ran Out of Food in the Last Year: Not on file  Transportation Needs:   . Lack of Transportation (Medical): Not on file  . Lack of Transportation (Non-Medical): Not on file  Physical Activity:   . Days of Exercise per Week: Not on file  . Minutes of Exercise per Session: Not on file  Stress:   . Feeling of Stress : Not on file  Social Connections:   . Frequency of Communication with Friends and Family: Not on file  . Frequency of Social Gatherings with Friends and Family: Not on file  .  Attends Religious Services: Not on file  . Active Member of Clubs or Organizations: Not on file  . Attends Banker Meetings: Not on file  . Marital Status: Not on file    Allergies:  Allergies  Allergen Reactions  . Sulfa Antibiotics Anaphylaxis, Swelling and Rash    Throat swelling/ rashes    Metabolic Disorder Labs: No results found for: HGBA1C, MPG No results found for: PROLACTIN No results found for: CHOL, TRIG, HDL, CHOLHDL, VLDL, LDLCALC Lab Results  Component Value Date   TSH 1.90 04/11/2012    Therapeutic Level Labs: No results found for: LITHIUM No results found for: VALPROATE No components found for:  CBMZ  Current Medications: Current Outpatient Medications  Medication Sig Dispense Refill  . cetirizine (ZYRTEC) 10 MG tablet  Take 10 mg by mouth at bedtime.    . dicyclomine (BENTYL) 20 MG tablet Take 1 tablet (20 mg total) by mouth in the morning and at bedtime. (Patient not taking: Reported on 08/30/2019) 60 tablet 1  . DULoxetine (CYMBALTA) 60 MG capsule Take 1 capsule (60 mg total) by mouth daily. 30 capsule 2  . famotidine (PEPCID) 40 MG tablet Take 1 tablet (40 mg total) by mouth daily. (Patient not taking: Reported on 01/09/2020) 30 tablet 0  . fluticasone (FLONASE) 50 MCG/ACT nasal spray Place 1 spray into both nostrils daily. (Patient taking differently: Place 1 spray into both nostrils daily as needed for allergies. ) 16 g 0  . hydrOXYzine (ATARAX/VISTARIL) 25 MG tablet Take 1 tablet (25 mg total) by mouth at bedtime. 30 tablet 2  . Na Sulfate-K Sulfate-Mg Sulf 17.5-3.13-1.6 GM/177ML SOLN Suprep-Use as directed 354 mL 0  . omeprazole (PRILOSEC) 40 MG capsule Take 1 capsule (40 mg total) by mouth daily. 90 capsule 3  . ondansetron (ZOFRAN) 4 MG tablet Take 1 tablet (4 mg total) by mouth every 8 (eight) hours as needed for nausea or vomiting. (Patient not taking: Reported on 01/09/2020) 30 tablet 1  . traZODone (DESYREL) 50 MG tablet Take 1 tablet (50  mg total) by mouth at bedtime as needed for sleep. 30 tablet 2  . Turmeric (QC TUMERIC COMPLEX PO) Take 1 tablet by mouth daily.     No current facility-administered medications for this visit.     Musculoskeletal: Strength & Muscle Tone: Unable to assess due to telehealth visit Gait & Station: Unable to assess due to telehealth visit Patient leans: N/A  Psychiatric Specialty Exam: Review of Systems  Last menstrual period 06/29/2013.There is no height or weight on file to calculate BMI.  General Appearance: Well Groomed  Eye Contact:  Good  Speech:  Clear and Coherent and Normal Rate  Volume:  Normal  Mood:  Anxious and Depressed  Affect:  Congruent  Thought Process:  Coherent, Goal Directed and Linear  Orientation:  Full (Time, Place, and Person)  Thought Content: WDL and Logical   Suicidal Thoughts:  No  Homicidal Thoughts:  No  Memory:  Immediate;   Good Recent;   Good Remote;   Good  Judgement:  Good  Insight:  Good  Psychomotor Activity:  Normal  Concentration:  Concentration: Good and Attention Span: Good  Recall:  Good  Fund of Knowledge: Good  Language: Good  Akathisia:  No  Handed:  Right  AIMS (if indicated): Not done  Assets:  Communication Skills Desire for Improvement Financial Resources/Insurance Housing Intimacy Social Support  ADL's:  Intact  Cognition: WNL  Sleep:  Fair   Screenings: PHQ2-9     Nutrition from 07/18/2013 in [facility]  PHQ-2 Total Score 2  PHQ-9 Total Score 7       Assessment and Plan: Patient reports that her mother-in-law recently passed away.  She notes that since that time her depression has worsened due to her grief.  She notes that she has also ran out of hydroxyzine and has been feeling more anxious lately and having problems sleeping.  She notes that she feared taking the trazodone however notes that she is going to take it tonight in order to rest.  No medication adjustments made.   Patient will continue medications as prescribed.  She will follow-up with her counselor to help manage her grief.   1. Mild episode of recurrent major depressive disorder (HCC)  Continue- DULoxetine (CYMBALTA) 60 MG capsule; Take 1 capsule (60 mg total) by mouth daily.  Dispense: 30 capsule; Refill: 2 Continue- hydrOXYzine (ATARAX/VISTARIL) 25 MG tablet; Take 1 tablet (25 mg total) by mouth at bedtime.  Dispense: 30 tablet; Refill: 2 Continue- traZODone (DESYREL) 50 MG tablet; Take 1 tablet (50 mg total) by mouth at bedtime as needed for sleep.  Dispense: 30 tablet; Refill: 2   Follow-up in 3 months Follow-up with therapy   Shanna Cisco, NP 02/26/2020, 1:11 PM

## 2020-02-26 NOTE — Progress Notes (Signed)
Subjective:    Patient ID: Catherine Koch, female    DOB: Dec 27, 1975, 44 y.o.   MRN: [id number]  HPI  44 year old obese woman presents to reestablish for OSA. She was diagnosed in 2013 and placed on CPAP of 10 cm with a full facemask with good improvement in her daytime somnolence and fatigue. She was last seen in 01/2017.  Have reviewed prior sleep studies and compliance reports. She underwent Septoplasty 2016 .  She has gained 15 to 20 pounds during the Covid pandemic. She reports increased somnolence issues but also agrees that she has had multiple other problems over the past 3 years-mother-in-law died about a month ago, she is battling anxiety and depression, she has prediabetes.  She admits to increased naps in the daytime.  She has been getting her supplies online and uses an F 20 fullface mask. Epworth sleepiness score is 13 and she reports sleepiness as a passenger in a car or lying down to rest in the afternoons.  She has always been a night owl, works as a Agricultural consultant, currently unemployed, bedtime can be as late as 5 AM COVID-19 positive test (U07.1, COVID-19) with Acute Pneumonia (J12.89, Other viral pneumonia) (If respiratory failure or sepsis present, add as separate assessment)  Sleep latency variable between 15 minutes to an hour, reports 2-3 nocturnal awakenings and is out of bed anywhere between noon and 3 PM feeling tired without headaches or dryness of mouth.  There is no history suggestive of cataplexy, sleep paralysis or parasomnias   Significant tests/ events reviewed  NPSG 01/24/2012-severe obstructive sleep apnea. AHI 62 per hour. CPAP titration to 10 CWP/AHI 1.4 per hour  Past Medical History:  Diagnosis Date   Anxiety    Arthritis    pt. states in knees   Chronic gastritis    Chronic sinus infection    Closed fracture of right distal fibula 11/19/2013   Complication of anesthesia    pt. states difficult to wake up   Depression    GERD  (gastroesophageal reflux disease)    Headache    pt. states random migraines   Hemorrhoids    High cholesterol    History of bronchitis    History of degenerative disc disease    Hypertension    Ovarian failure    PTSD (post-traumatic stress disorder)    Shortness of breath    exertion from crutches   Sleep apnea    cpap   UTI (lower urinary tract infection)    Wears glasses    Past Surgical History:  Procedure Laterality Date   BIOPSY  12/10/2019   Procedure: BIOPSY;  Surgeon: Napoleon Form, MD;  Location: [facility];  Service: Endoscopy;;   CHOLECYSTECTOMY  2010   CHOLECYSTECTOMY     COLONOSCOPY WITH PROPOFOL N/A 12/10/2019   Procedure: COLONOSCOPY WITH PROPOFOL;  Surgeon: Napoleon Form, MD;  Location: [facility];  Service: Endoscopy;  Laterality: N/A;   ESOPHAGOGASTRODUODENOSCOPY     ESOPHAGOGASTRODUODENOSCOPY (EGD) WITH PROPOFOL N/A 12/10/2019   Procedure: ESOPHAGOGASTRODUODENOSCOPY (EGD) WITH PROPOFOL;  Surgeon: Napoleon Form, MD;  Location: [facility];  Service: Endoscopy;  Laterality: N/A;   NASAL SEPTOPLASTY W/ TURBINOPLASTY Bilateral 12/11/2014   Procedure: NASAL SEPTOPLASTY AND BILATERAL INFERIOR TURBINATE REDUCTION;  Surgeon: Osborn Coho, MD;  Location[facility] OR;  Service: ENT;  Laterality: Bilateral;   ORIF FIBULA FRACTURE Right 11/19/2013   Procedure: OPEN REDUCTION INTERNAL FIXATION (ORIF) RIGHT FIBULA FRACTURE;  Surgeon: Eulas Post, MD;  Location: [facility] OR;  Service: Orthopedics;  Laterality: Right;   WISDOM TOOTH EXTRACTION      Allergies  Allergen Reactions   Sulfa Antibiotics Anaphylaxis, Swelling and Rash    Throat swelling/ rashes    Social History   Socioeconomic History   Marital status: Married    Spouse name: Not on file   Number of children: Not on file   Years of education: Not on file   Highest education level: Not on file  Occupational History    Comment: UNEMPOLYEED   Tobacco Use    Smoking status: Former Smoker    Types: Cigarettes    Quit date: 05/24/2010    Years since quitting: 9.7   Smokeless tobacco: Never Used   Tobacco comment: Pt. states she used to be a social smoker  Vaping Use   Vaping Use: Never used  Substance and Sexual Activity   Alcohol use: Not Currently   Drug use: No   Sexual activity: Yes    Birth control/protection: None  Other Topics Concern   Not on file  Social History Narrative   Not on file   Social Determinants of Health   Financial Resource Strain:    Difficulty of Paying Living Expenses: Not on file  Food Insecurity:    Worried About Programme researcher, broadcasting/film/video in the Last Year: Not on file   [employer] of Food in the Last Year: Not on file  Transportation Needs:    Lack of Transportation (Medical): Not on file   Lack of Transportation (Non-Medical): Not on file  Physical Activity:    Days of Exercise per Week: Not on file   Minutes of Exercise per Session: Not on file  Stress:    Feeling of Stress : Not on file  Social Connections:    Frequency of Communication with Friends and Family: Not on file   Frequency of Social Gatherings with Friends and Family: Not on file   Attends Religious Services: Not on file   Active Member of Clubs or Organizations: Not on file   Attends Banker Meetings: Not on file   Marital Status: Not on file  Intimate Partner Violence:    Fear of Current or Ex-Partner: Not on file   Emotionally Abused: Not on file   Physically Abused: Not on file   Sexually Abused: Not on file    Family History  Problem Relation Age of Onset   Allergies Sister    Heart disease Paternal Grandmother    Cancer Paternal Grandmother        BREAST CANCER   Cancer Paternal Grandfather       Review of Systems Shortness of breath with activity Occasional productive white phlegm Acid heartburn, indigestion, on omeprazole Weight gain Headaches, nasal congestion HA Anxiety  depression Feet swelling Joint stiffness    Objective:   Physical Exam  Gen. Pleasant, obese, in no distress ENT - no lesions, no post nasal drip Neck: No JVD, no thyromegaly, no carotid bruits Lungs: no use of accessory muscles, no dullness to percussion, decreased without rales or rhonchi  Cardiovascular: Rhythm regular, heart sounds  normal, no murmurs or gallops, no peripheral edema Musculoskeletal: No deformities, no cyanosis or clubbing , no tremors       Assessment & Plan:

## 2020-02-26 NOTE — Assessment & Plan Note (Signed)
She has been maintained on CPAP) centimeters.  It is possible that due to weight gain she needs higher pressure and this is accounting for increased hypersomnolence.  She also has several other issues surrounding her general health which could be contributing.  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  We were unable to get a download of her CPAP card today.  She will bring this back in within a month so that we can obtain download.  We discussed AirFit F30 full facemask and she will trial this mask in the future.  Her CPAP machine is currently working well but she would be eligible for a replacement whenever she is ready or if this machine breaks down.  Supplies will be renewed for a year.  CPAP recently helped improve her daytime somnolence and fatigue and she is very compliant including during naps

## 2020-02-26 NOTE — Patient Instructions (Signed)
Trial of AirFit F30 full facemask. She will qualify for a new machine if you are current with breaks down. Please call us in 1 month to see if we can check download on your CPAP card

## 2020-03-19 ENCOUNTER — Other Ambulatory Visit: Payer: Self-pay

## 2020-03-19 ENCOUNTER — Ambulatory Visit (INDEPENDENT_AMBULATORY_CARE_PROVIDER_SITE_OTHER): Payer: No Payment, Other | Admitting: Licensed Clinical Social Worker

## 2020-03-19 DIAGNOSIS — F418 Other specified anxiety disorders: Secondary | ICD-10-CM | POA: Diagnosis not present

## 2020-03-19 NOTE — Progress Notes (Signed)
   THERAPIST PROGRESS NOTE   Virtual Visit via Video Note  I connected with Catherine Koch on 03/19/20 at  1:00 PM EDT by a video enabled telemedicine application and verified that I am speaking with the correct person using two identifiers.  Location: Patient: Home Provider: [facility]   I discussed the limitations of evaluation and management by telemedicine and the availability of in person appointments. The patient expressed understanding and agreed to proceed. I discussed the assessment and treatment plan with the patient. The patient was provided an opportunity to ask questions and all were answered. The patient agreed with the plan and demonstrated an understanding of the instructions.    I provided 45 minutes of non-face-to-face time during this encounter.  Participation Level: Active  Behavioral Response: CasualLethargicAnxious and Depressed  Type of Therapy: Individual Therapy  Treatment Goals addressed: Communication: Dep/grief/anx/coping  Interventions: Supportive and Other: Grief counseling and education  Summary: Catherine Koch is a 44 y.o. female who presents with hx of dep/anx. Pt signs on for video session per her preference. She immediately apologizes for missing last session confirming that her mil did die. Mil died 2020/02/05. Most of session devoted to addressing loss with education and grief counseling including typical grief reactions/stages of grief. Pt also mentions her friend moved out, which was like another loss, but also seems to have some benefits for spouse and her. 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 She reports they got life ins money in last couple of days, which will help them reorganize finances. She states reorganization of the home is still a major project which is stressful. LCSW addressed coping strategies, including small obtainable goals with a calendar. Asked pt to consider incorporating exercise (has a rowing  machine) and get sleep/gaming on more of a schedule. Pt states she will try. LCSW reviewed poc including scheduling prior to close of session. Pt states appreciation for care.   Suicidal/Homicidal: Nowithout intent/plan  Therapist Response: Pt remains receptive to care.  Plan: Return again in 4 weeks.  Diagnosis: Axis I: Depression with anx    Axis II: Deferred  Walden Sink, LCSW 03/19/2020

## 2020-04-13 ENCOUNTER — Other Ambulatory Visit: Payer: Self-pay

## 2020-04-13 ENCOUNTER — Ambulatory Visit
Admission: EM | Admit: 2020-04-13 | Discharge: 2020-04-13 | Disposition: A | Discharge status: Home / Self Care | Payer: Medicaid Other | Attending: Physician Assistant | Admitting: Physician Assistant

## 2020-04-13 DIAGNOSIS — R399 Unspecified symptoms and signs involving the genitourinary system: Secondary | ICD-10-CM

## 2020-04-13 DIAGNOSIS — R110 Nausea: Secondary | ICD-10-CM | POA: Insufficient documentation

## 2020-04-13 LAB — POCT URINALYSIS DIP (MANUAL ENTRY)
Bilirubin, UA: NEGATIVE
Blood, UA: NEGATIVE
Glucose, UA: NEGATIVE mg/dL
Ketones, POC UA: NEGATIVE mg/dL
Leukocytes, UA: NEGATIVE
Nitrite, UA: NEGATIVE
Protein Ur, POC: NEGATIVE mg/dL
Spec Grav, UA: >=1.03 — AB (ref 1.010–1.025)
Urobilinogen, UA: 0.2 E.U./dL
pH, UA: 6 (ref 5.0–8.0)

## 2020-04-13 MED ORDER — ONDANSETRON 4 MG PO TBDP: 4.0000 mg | ORAL_TABLET | Freq: Three times a day (TID) | ORAL | 0 refills | Status: DC | PRN

## 2020-04-13 NOTE — ED Provider Notes (Signed)
[facility]    CSN: [id number] Arrival date & time: 04/13/20  1405      History   Chief Complaint Chief Complaint  Patient presents with  . Urinary Tract Infection    symptomatic x 1 week  . Nausea    x 1 week    HPI Catherine Koch is a 45 y.o. female.   44 year old female comes in for 1 week history of urinary symptoms. Urinary frequency, nocturia, bladder pressure. Denies fever, chills, body aches. tmax 100.1. Nausea without vomiting. Denies vaginal symptoms. Postmenopausal.      Past Medical History:  Diagnosis Date  . Anxiety   . Arthritis    pt. states in knees  . Chronic gastritis   . Chronic sinus infection   . Closed fracture of right distal fibula 11/19/2013  . Complication of anesthesia    pt. states difficult to wake up  . Depression   . GERD (gastroesophageal reflux disease)   . Headache    pt. states random migraines  . Hemorrhoids   . High cholesterol   . History of bronchitis   . History of degenerative disc disease   . Hypertension   . Ovarian failure   . PTSD (post-traumatic stress disorder)   . Shortness of breath    exertion from crutches  . Sleep apnea    cpap  . UTI (lower urinary tract infection)   . Wears glasses     Patient Active Problem List   Diagnosis Date Noted  . Mild episode of recurrent major depressive disorder (HCC) 02/26/2020  . LLQ abdominal pain   . Rectal bleeding   . Gastritis and gastroduodenitis   . Nausea without vomiting   . LUQ abdominal pain   . Deviated nasal septum 12/11/2014    Class: Chronic  . Closed fracture of right distal fibula 11/19/2013  . S/P ORIF (open reduction internal fixation) fracture 11/19/2013  . Obesity, morbid (HCC) 06/17/2013  . Chronic rhinitis 06/17/2013  . Acute maxillary sinusitis 06/03/2012  . Obstructive sleep apnea 01/15/2012  . Depression 01/15/2012    Past Surgical History:  Procedure Laterality Date  . BIOPSY  12/10/2019   Procedure: BIOPSY;   Surgeon: Napoleon Form, MD;  Location: [facility];  Service: Endoscopy;;  . CHOLECYSTECTOMY  2010  . CHOLECYSTECTOMY    . COLONOSCOPY WITH PROPOFOL N/A 12/10/2019   Procedure: COLONOSCOPY WITH PROPOFOL;  Surgeon: Napoleon Form, MD;  Location: [facility];  Service: Endoscopy;  Laterality: N/A;  . ESOPHAGOGASTRODUODENOSCOPY    . ESOPHAGOGASTRODUODENOSCOPY (EGD) WITH PROPOFOL N/A 12/10/2019   Procedure: ESOPHAGOGASTRODUODENOSCOPY (EGD) WITH PROPOFOL;  Surgeon: Napoleon Form, MD;  Location: [facility];  Service: Endoscopy;  Laterality: N/A;  . NASAL SEPTOPLASTY W/ TURBINOPLASTY Bilateral 12/11/2014   Procedure: NASAL SEPTOPLASTY AND BILATERAL INFERIOR TURBINATE REDUCTION;  Surgeon: Osborn Coho, MD;  Location: [facility] OR;  Service: ENT;  Laterality: Bilateral;  . ORIF FIBULA FRACTURE Right 11/19/2013   Procedure: OPEN REDUCTION INTERNAL FIXATION (ORIF) RIGHT FIBULA FRACTURE;  Surgeon: Eulas Post, MD;  Location: [facility] OR;  Service: Orthopedics;  Laterality: Right;  . WISDOM TOOTH EXTRACTION      OB History    Gravida  0   Para  0   Term  0   Preterm  0   AB  0   Living  0     SAB  0   TAB  0   Ectopic  0   Multiple  0  Live Births  0            Home Medications    Prior to Admission medications   Medication Sig Start Date End Date Taking? Authorizing Provider  cetirizine (ZYRTEC) 10 MG tablet Take 10 mg by mouth at bedtime.   Yes [provider]  DULoxetine (CYMBALTA) 60 MG capsule Take 1 capsule (60 mg total) by mouth daily. 02/26/20  Yes Toy Cookey E, NP  fluticasone (FLONASE) 50 MCG/ACT nasal spray Place 1 spray into both nostrils daily. Patient taking differently: Place 1 spray into both nostrils daily as needed for allergies.  04/22/17  Yes Caccavale, Sophia, PA-C  hydrOXYzine (ATARAX/VISTARIL) 25 MG tablet Take 1 tablet (25 mg total) by mouth at bedtime. 02/26/20  Yes Toy Cookey E, NP  omeprazole (PRILOSEC) 40 MG capsule  Take 1 capsule (40 mg total) by mouth daily. 12/10/19  Yes Nandigam, Eleonore Chiquito, MD  traZODone (DESYREL) 50 MG tablet Take 1 tablet (50 mg total) by mouth at bedtime as needed for sleep. 02/26/20  Yes Shanna Cisco, NP  Turmeric (QC TUMERIC COMPLEX PO) Take 1 tablet by mouth daily.   Yes [provider]  ondansetron (ZOFRAN ODT) 4 MG disintegrating tablet Take 1 tablet (4 mg total) by mouth every 8 (eight) hours as needed for nausea or vomiting. 04/13/20   Cathie Hoops, Ethelbert Thain V, PA-C  ranitidine (ZANTAC) 150 MG tablet Take 1 tablet (150 mg total) by mouth 2 (two) times daily. 10/25/17 08/04/19  Muthersbaugh, Dahlia Client, PA-C    Family History Family History  Problem Relation Age of Onset  . Allergies Sister   . Heart disease Paternal Grandmother   . Cancer Paternal Grandmother        BREAST CANCER  . Cancer Paternal Grandfather     Social History Social History   Tobacco Use  . Smoking status: Former Smoker    Types: Cigarettes    Quit date: 05/24/2010    Years since quitting: 9.8  . Smokeless tobacco: Never Used  . Tobacco comment: Pt. states she used to be a social smoker  Vaping Use  . Vaping Use: Never used  Substance Use Topics  . Alcohol use: Not Currently  . Drug use: No     Allergies   Sulfa antibiotics   Review of Systems Review of Systems  Reason unable to perform ROS: See HPI as above.     Physical Exam Triage Vital Signs ED Triage Vitals  Enc Vitals Group     BP 04/13/20 1514 (!) 141/95     Pulse Rate 04/13/20 1514 (!) 57     Resp 04/13/20 1514 20     Temp 04/13/20 1514 98.1 F (36.7 C)     Temp Source 04/13/20 1514 Oral     SpO2 04/13/20 1514 97 %     Weight --      Height --      Head Circumference --      Peak Flow --      Pain Score 04/13/20 1534 3     Pain Loc --      Pain Edu? --      Excl. in GC? --    No data found.  Updated Vital Signs BP (!) 141/95 (BP Location: Right Arm)   Pulse (!) 57   Temp 98.1 F (36.7 C) (Oral)   Resp 20    LMP 06/29/2013 Comment: waiver signed  SpO2 97%   Physical Exam Constitutional:      General:  She is not in acute distress.    Appearance: She is well-developed. She is not ill-appearing, toxic-appearing or diaphoretic.  HENT:     Head: Normocephalic and atraumatic.  Eyes:     Conjunctiva/sclera: Conjunctivae normal.     Pupils: Pupils are equal, round, and reactive to light.  Cardiovascular:     Rate and Rhythm: Normal rate and regular rhythm.  Pulmonary:     Effort: Pulmonary effort is normal. No respiratory distress.     Comments: LCTAB Abdominal:     General: Bowel sounds are normal.     Palpations: Abdomen is soft.     Tenderness: There is abdominal tenderness in the left upper quadrant and left lower quadrant. There is no right CVA tenderness, left CVA tenderness, guarding or rebound.  Musculoskeletal:     Cervical back: Normal range of motion and neck supple.  Skin:    General: Skin is warm and dry.  Neurological:     Mental Status: She is alert and oriented to person, place, and time.  Psychiatric:        Behavior: Behavior normal.        Judgment: Judgment normal.      UC Treatments / Results  Labs (all labs ordered are listed, but only abnormal results are displayed) Labs Reviewed  POCT URINALYSIS DIP (MANUAL ENTRY) - Abnormal; Notable for the following components:      Result Value   Spec Grav, UA >=1.030 (*)    All other components within normal limits  URINE CULTURE    EKG   Radiology No results found.  Procedures Procedures (including critical care time)  Medications Ordered in UC Medications - No data to display  Initial Impression / Assessment and Plan / UC Course  I have reviewed the triage vital signs and the nursing notes.  Pertinent labs & imaging results that were available during my care of the patient were reviewed by me and considered in my medical decision making (see chart for details).    Dipstick negative for blood,  leukocytes, nitrites.  Does show increase in specific gravity.  Will have patient push fluids, and await urine culture prior to treatment.  Patient does have some left upper and lower quadrant tenderness without guarding or rebound on exam.  Has been having some changes in bowel habits, discussed to monitor for now.  Return precautions given.  Final Clinical Impressions(s) / UC Diagnoses   Final diagnoses:  Lower urinary tract symptoms  Nausea without vomiting    ED Prescriptions    Medication Sig Dispense Auth. Provider   ondansetron (ZOFRAN ODT) 4 MG disintegrating tablet Take 1 tablet (4 mg total) by mouth every 8 (eight) hours as needed for nausea or vomiting. 20 tablet Belinda Fisher, PA-C     PDMP not reviewed this encounter.   Belinda Fisher, PA-C 04/13/20 1607

## 2020-04-13 NOTE — ED Triage Notes (Signed)
Pt complains of UTI symptoms and nausea x 1 week as well as some other symptoms such as sore throat that may not be r/t the uti. Pt is aox4 and ambulatory.

## 2020-04-13 NOTE — Discharge Instructions (Signed)
Urine at this time without obvious bacteria that could be causing symptoms.  I have sent this for urine culture, if you require antibiotics, they will contact you.  Zofran as needed for nausea/vomiting. Keep hydrated, urine should be clear to pale yellow in color. Monitor for any worsening of symptoms, fever, worsening abdominal pain, nausea/vomiting, flank pain, go to the emergency department for further evaluation.

## 2020-04-15 LAB — URINE CULTURE

## 2020-04-21 ENCOUNTER — Telehealth: Payer: Self-pay | Admitting: Pulmonary Disease

## 2020-04-21 DIAGNOSIS — G4733 Obstructive sleep apnea (adult) (pediatric): Secondary | ICD-10-CM

## 2020-04-22 NOTE — Telephone Encounter (Signed)
Pt requesting a new cpap machine.  Per last OV RA had said that pt would be eligible for a new machine when her machine broke.  Pt requesting a rx printed for pickup instead of [place] sending to a DME.  rx printed per last cpap orders and placed up front for pt.  Nothing further needed at this time- will close encounter.

## 2020-04-22 NOTE — Telephone Encounter (Signed)
Patient is returning phone call. Patient phone number is [phone].

## 2020-04-22 NOTE — Telephone Encounter (Signed)
lmtcb for pt.  

## 2020-04-25 ENCOUNTER — Other Ambulatory Visit: Payer: Self-pay | Admitting: Urgent Care

## 2020-04-25 DIAGNOSIS — R891 Abnormal level of hormones in specimens from other organs, systems and tissues: Secondary | ICD-10-CM

## 2020-05-08 ENCOUNTER — Ambulatory Visit (INDEPENDENT_AMBULATORY_CARE_PROVIDER_SITE_OTHER): Payer: No Payment, Other | Admitting: Licensed Clinical Social Worker

## 2020-05-08 ENCOUNTER — Other Ambulatory Visit: Payer: Self-pay

## 2020-05-08 DIAGNOSIS — F418 Other specified anxiety disorders: Secondary | ICD-10-CM | POA: Diagnosis not present

## 2020-05-09 NOTE — Progress Notes (Signed)
   THERAPIST PROGRESS NOTE   Virtual Visit via Video Note  I connected with Catherine Koch on 05/08/20 at  9:00 AM EST by a video enabled telemedicine application and verified that I am speaking with the correct person using two identifiers.  Location: Patient: Home Provider: [facility]   I discussed the limitations of evaluation and management by telemedicine and the availability of in person appointments. The patient expressed understanding and agreed to proceed. I discussed the assessment and treatment plan with the patient. The patient was provided an opportunity to ask questions and all were answered. The patient agreed with the plan and demonstrated an understanding of the instructions.  I provided 45 minutes of non-face-to-face time during this encounter.  Participation Level: Active  Behavioral Response: CasualDrowsyDepressed  Type of Therapy: Individual Therapy  Treatment Goals addressed: Communication: Dep/Anx/Coping  Interventions: Motivational Interviewing and Supportive  Summary: Catherine Koch is a 44 y.o. female who presents with hx of dep/anx. This date pt signs on for video session per her preference. Pt presents as drowsy and depressed. She yawns repeatedly throughout session. Pt reports ongoing struggles with poor concentration, lack of motivation, overwhelmed, unorganized. Pt reports she is not even doing a good job remembering to take her meds lately. LCSW reminds pt of common grief reactions and recommends pill box, which pt agrees to. Assessed for grief management. Pt states it is more difficult with the holidays and she found herself crying last wk realizing some of the things she will not be able to do with mil this year. LCSW provided support and additional grief education. Encouraged allowing grief feelings. Pt reports she had a couple of weeks she could barely function as one of her cats broke her Cpap machine. Pt was without Cpap and having to try to sleep in  recliner for two weeks. She states she has now gotten new machine and is trying to catch up. Pt advises they have yet to get the house organized the way they want it and have not moved into the master bedroom. Hopes to be moved in by new year and master getting ready to be painted. Pt states they have less financial strain and are closer to getting the house in their name from handling of estate giving some stress relief. Pt has not gotten a schedule together. She states she feels like when they get the home more situated she will be more successful with this goal. Pt reports ongoing physical challenges and has MRI pending to eval adrenal glands d/t hormone imbalance. Unmanaged back pain limiting how much physical activity pt can do at one time. LCSW assisted to process frustrations of dealing with physical disability and impacts on MH. LCSW reviewed poc including next appt prior to close of session. Pt states appreciation for care.    Suicidal/Homicidal: Nowithout intent/plan  Therapist Response: Pt remains receptive to care.  Plan: Return again in ~ 4weeks.  Diagnosis: Axis I: Depression with Anxiety    Axis II: Deferred  O[place]e Sink, LCSW 05/09/2020

## 2020-05-13 ENCOUNTER — Other Ambulatory Visit: Payer: Medicaid Other

## 2020-05-13 ENCOUNTER — Ambulatory Visit
Admission: RE | Admit: 2020-05-13 | Discharge: 2020-05-13 | Disposition: A | Discharge status: Home / Self Care | Payer: Self-pay | Source: Ambulatory Visit | Attending: Urgent Care | Admitting: Urgent Care

## 2020-05-13 DIAGNOSIS — R891 Abnormal level of hormones in specimens from other organs, systems and tissues: Secondary | ICD-10-CM

## 2020-05-23 ENCOUNTER — Other Ambulatory Visit: Payer: Self-pay

## 2020-05-23 ENCOUNTER — Encounter (HOSPITAL_COMMUNITY): Payer: Self-pay | Admitting: Emergency Medicine

## 2020-05-23 ENCOUNTER — Emergency Department (HOSPITAL_BASED_OUTPATIENT_CLINIC_OR_DEPARTMENT_OTHER): Payer: Self-pay

## 2020-05-23 ENCOUNTER — Emergency Department (HOSPITAL_COMMUNITY)
Admission: EM | Admit: 2020-05-23 | Discharge: 2020-05-23 | Disposition: A | Discharge status: Home / Self Care | Payer: Medicaid Other | Attending: Emergency Medicine | Admitting: Emergency Medicine

## 2020-05-23 ENCOUNTER — Emergency Department (HOSPITAL_COMMUNITY): Payer: Medicaid Other

## 2020-05-23 DIAGNOSIS — M7989 Other specified soft tissue disorders: Secondary | ICD-10-CM

## 2020-05-23 DIAGNOSIS — Z79899 Other long term (current) drug therapy: Secondary | ICD-10-CM | POA: Insufficient documentation

## 2020-05-23 DIAGNOSIS — L03116 Cellulitis of left lower limb: Secondary | ICD-10-CM

## 2020-05-23 DIAGNOSIS — I10 Essential (primary) hypertension: Secondary | ICD-10-CM | POA: Insufficient documentation

## 2020-05-23 DIAGNOSIS — Z87891 Personal history of nicotine dependence: Secondary | ICD-10-CM | POA: Insufficient documentation

## 2020-05-23 MED ORDER — DOXYCYCLINE HYCLATE 100 MG PO CAPS: 100.0000 mg | ORAL_CAPSULE | Freq: Two times a day (BID) | ORAL | 0 refills | Status: DC

## 2020-05-23 MED ORDER — DOXYCYCLINE HYCLATE 100 MG PO TABS
100.0000 mg | ORAL_TABLET | Freq: Once | ORAL | Status: AC
  Administered 2020-05-23: 100 mg via ORAL
  Filled 2020-05-23: qty 1

## 2020-05-23 NOTE — Progress Notes (Signed)
Lower extremity venous LT study completed.  Preliminary results relayed to Welch, PA at bedside.   See CV Proc for preliminary results report.   Jean Rosenthal, RDMS

## 2020-05-23 NOTE — ED Provider Notes (Signed)
[facility]-EMERGENCY DEPT Provider Note   CSN: [id number] Arrival date & time: 05/23/20  1729     History Chief Complaint  Patient presents with  . Foot Pain    Catherine Koch is a 44 y.o. female with history of elevated BMI, insulin resistance, HTN, depression presents to ER for evaluation of left foot pain associated with swelling, redness, warmth for 4 days. Reports walking around [employer] for 3 hours and later that evening noticing the pain.  Symptoms have progressively worsened. Worse with palpation, walking.  States having chronic intermittent feet swelling but typically this improves on its own. Denies direct trauma or falls. Denies calf pain. Denies distal tingling or numbness. No CP, SOB, hemoptysis. No h/o DVT or PE. No recent surgery, prolonged immobilization, hormone therapy.  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 No interventions.   HPI     Past Medical History:  Diagnosis Date  . Anxiety   . Arthritis    pt. states in knees  . Chronic gastritis   . Chronic sinus infection   . Closed fracture of right distal fibula 11/19/2013  . Complication of anesthesia    pt. states difficult to wake up  . Depression   . GERD (gastroesophageal reflux disease)   . Headache    pt. states random migraines  . Hemorrhoids   . High cholesterol   . History of bronchitis   . History of degenerative disc disease   . Hypertension   . Ovarian failure   . PTSD (post-traumatic stress disorder)   . Shortness of breath    exertion from crutches  . Sleep apnea    cpap  . UTI (lower urinary tract infection)   . Wears glasses     Patient Active Problem List   Diagnosis Date Noted  . Mild episode of recurrent major depressive disorder (HCC) 02/26/2020  . LLQ abdominal pain   . Rectal bleeding   . Gastritis and gastroduodenitis   . Nausea without vomiting   . LUQ abdominal pain   .  Deviated nasal septum 12/11/2014    Class: Chronic  . Closed fracture of right distal fibula 11/19/2013  . S/P ORIF (open reduction internal fixation) fracture 11/19/2013  . Obesity, morbid (HCC) 06/17/2013  . Chronic rhinitis 06/17/2013  . Acute maxillary sinusitis 06/03/2012  . Obstructive sleep apnea 01/15/2012  . Depression 01/15/2012    Past Surgical History:  Procedure Laterality Date  . BIOPSY  12/10/2019   Procedure: BIOPSY;  Surgeon: Napoleon Form, MD;  Location: [facility];  Service: Endoscopy;;  . CHOLECYSTECTOMY  2010  . CHOLECYSTECTOMY    . COLONOSCOPY WITH PROPOFOL N/A 12/10/2019   Procedure: COLONOSCOPY WITH PROPOFOL;  Surgeon: Napoleon Form, MD;  Location: [facility];  Service: Endoscopy;  Laterality: N/A;  . ESOPHAGOGASTRODUODENOSCOPY    . ESOPHAGOGASTRODUODENOSCOPY (EGD) WITH PROPOFOL N/A 12/10/2019   Procedure: ESOPHAGOGASTRODUODENOSCOPY (EGD) WITH PROPOFOL;  Surgeon: Napoleon Form, MD;  Location: [facility];  Service: Endoscopy;  Laterality: N/A;  . NASAL SEPTOPLASTY W/ TURBINOPLASTY Bilateral 12/11/2014   Procedure: NASAL SEPTOPLASTY AND BILATERAL INFERIOR TURBINATE REDUCTION;  Surgeon: Osborn Coho, MD;  Location: [facility] OR;  Service: ENT;  Laterality: Bilateral;  . ORIF FIBULA FRACTURE Right 11/19/2013   Procedure: OPEN REDUCTION INTERNAL FIXATION (ORIF) RIGHT FIBULA FRACTURE;  Surgeon: Eulas Post, MD;  Location: [facility] OR;  Service: Orthopedics;  Laterality: Right;  . WISDOM TOOTH  EXTRACTION       OB History    Gravida  0   Para  0   Term  0   Preterm  0   AB  0   Living  0     SAB  0   IAB  0   Ectopic  0   Multiple  0   Live Births  0           Family History  Problem Relation Age of Onset  . Allergies Sister   . Heart disease Paternal Grandmother   . Cancer Paternal Grandmother        BREAST CANCER  . Cancer Paternal Grandfather     Social History   Tobacco Use  . Smoking status: Former Smoker     Types: Cigarettes    Quit date: 05/24/2010    Years since quitting: 10.0  . Smokeless tobacco: Never Used  . Tobacco comment: Pt. states she used to be a social smoker  Vaping Use  . Vaping Use: Never used  Substance Use Topics  . Alcohol use: Not Currently  . Drug use: No    Home Medications Prior to Admission medications   Medication Sig Start Date End Date Taking? Authorizing Provider  doxycycline (VIBRAMYCIN) 100 MG capsule Take 1 capsule (100 mg total) by mouth 2 (two) times daily. 05/23/20  Yes Liberty Handy, PA-C  cetirizine (ZYRTEC) 10 MG tablet Take 10 mg by mouth at bedtime.    [provider]  DULoxetine (CYMBALTA) 60 MG capsule Take 1 capsule (60 mg total) by mouth daily. 02/26/20   Shanna Cisco, NP  fluticasone (FLONASE) 50 MCG/ACT nasal spray Place 1 spray into both nostrils daily. Patient taking differently: Place 1 spray into both nostrils daily as needed for allergies.  04/22/17   Caccavale, Sophia, PA-C  hydrOXYzine (ATARAX/VISTARIL) 25 MG tablet Take 1 tablet (25 mg total) by mouth at bedtime. 02/26/20   Shanna Cisco, NP  omeprazole (PRILOSEC) 40 MG capsule Take 1 capsule (40 mg total) by mouth daily. 12/10/19   Napoleon Form, MD  ondansetron (ZOFRAN ODT) 4 MG disintegrating tablet Take 1 tablet (4 mg total) by mouth every 8 (eight) hours as needed for nausea or vomiting. 04/13/20   Cathie Hoops, Amy V, PA-C  traZODone (DESYREL) 50 MG tablet Take 1 tablet (50 mg total) by mouth at bedtime as needed for sleep. 02/26/20   Shanna Cisco, NP  Turmeric (QC TUMERIC COMPLEX PO) Take 1 tablet by mouth daily.    [provider]  ranitidine (ZANTAC) 150 MG tablet Take 1 tablet (150 mg total) by mouth 2 (two) times daily. 10/25/17 08/04/19  Muthersbaugh, Dahlia Client, PA-C    Allergies    Sulfa antibiotics  Review of Systems   Review of Systems  Musculoskeletal: Positive for arthralgias and joint swelling.  Skin: Positive for color change.  All other  systems reviewed and are negative.   Physical Exam Updated Vital Signs BP (!) 173/102   Pulse 93   Temp 98.3 F (36.8 C) (Oral)   Resp 20   LMP 06/29/2013 Comment: waiver signed  SpO2 96%   Physical Exam Vitals and nursing note reviewed.  Constitutional:      General: She is not in acute distress.    Appearance: She is well-developed and well-nourished.     Comments: NAD.  HENT:     Head: Normocephalic and atraumatic.     Right Ear: External ear normal.  Left Ear: External ear normal.     Nose: Nose normal.  Eyes:     General: No scleral icterus.    Extraocular Movements: EOM normal.     Conjunctiva/sclera: Conjunctivae normal.  Cardiovascular:     Rate and Rhythm: Normal rate and regular rhythm.     Heart sounds: Normal heart sounds. No murmur heard.     Comments: 1+ DP and PT pulses bilaterally. No calf tenderness bilaterally. Minimal dorsal feet edema, non pitting L slightly worse than right Pulmonary:     Effort: Pulmonary effort is normal.     Breath sounds: Normal breath sounds. No wheezing.  Musculoskeletal:        General: Tenderness present. No deformity. Normal range of motion.     Cervical back: Normal range of motion and neck supple.     Comments: Mild left 4th/5th metatarsal tenderness. No focal bony tenderness of left ankle, tibia/fibula or toes. Full ROM of ankle without pain. No calf tenderness.   Skin:    General: Skin is warm and dry.     Capillary Refill: Capillary refill takes less than 2 seconds.     Findings: Erythema present.     Comments: Calluses on 1st submetatarsal and calcaneous bilaterally. Mild erythema on sole of left foot extending to dorsal foot, does not extend to ankle of distal lower leg. Mildly tender. No focal fluctuance or abscess. No vesicular lesions.   Neurological:     Mental Status: She is alert and oriented to person, place, and time.  Psychiatric:        Mood and Affect: Mood and affect normal.        Behavior: Behavior  normal.        Thought Content: Thought content normal.        Judgment: Judgment normal.     ED Results / Procedures / Treatments   Labs (all labs ordered are listed, but only abnormal results are displayed) Labs Reviewed - No data to display  EKG None  Radiology DG Foot Complete Left  Result Date: 05/23/2020 CLINICAL DATA:  Pain swelling warmth and redness EXAM: LEFT FOOT - COMPLETE 3+ VIEW COMPARISON:  10/25/2017 FINDINGS: No fracture or malalignment. No periostitis or bone destruction. Small plantar calcaneal spur. IMPRESSION: No acute osseous abnormality. Electronically Signed   By: Jasmine Pang M.D.   On: 05/23/2020 19:50   VAS [place] LOWER EXTREMITY VENOUS (DVT) (ONLY [facility])  Result Date: 05/23/2020  Lower Venous DVT Study Indications: Redness, swelling to LT foot.  Limitations: Body habitus and depth and size of vessels. Comparison Study: No prior studies. Performing Technologist: Jean Rosenthal RDMS  Examination Guidelines: A complete evaluation includes B-mode imaging, spectral Doppler, color Doppler, and power Doppler as needed of all accessible portions of each vessel. Bilateral testing is considered an integral part of a complete examination. Limited examinations for reoccurring indications may be performed as noted. The reflux portion of the exam is performed with the patient in reverse Trendelenburg.  +-----+---------------+---------+-----------+----------+--------------+ RIGHTCompressibilityPhasicitySpontaneityPropertiesThrombus Aging +-----+---------------+---------+-----------+----------+--------------+ CFV  Full           Yes      Yes                                 +-----+---------------+---------+-----------+----------+--------------+   +---------+---------------+---------+-----------+----------+---------------+ LEFT     CompressibilityPhasicitySpontaneityPropertiesThrombus Aging   +---------+---------------+---------+-----------+----------+---------------+ CFV      Full  Yes      Yes                                  +---------+---------------+---------+-----------+----------+---------------+ SFJ      Full                                                         +---------+---------------+---------+-----------+----------+---------------+ FV Prox  Full                                                         +---------+---------------+---------+-----------+----------+---------------+ FV Mid   Full                                                         +---------+---------------+---------+-----------+----------+---------------+ FV Distal               Yes      Yes                  Patent by color +---------+---------------+---------+-----------+----------+---------------+ PFV      Full                                                         +---------+---------------+---------+-----------+----------+---------------+ POP      Full           Yes      Yes                                  +---------+---------------+---------+-----------+----------+---------------+ PTV      Full                                                         +---------+---------------+---------+-----------+----------+---------------+ PERO                                                  Not visualized  +---------+---------------+---------+-----------+----------+---------------+ Gastroc  Full                                                         +---------+---------------+---------+-----------+----------+---------------+     Summary: RIGHT: - No evidence of common femoral vein obstruction.  LEFT: - There is no evidence of deep vein thrombosis in the lower  extremity. However, portions of this examination were limited- see technologist comments above.  - No cystic structure found in the popliteal fossa.  *See table(s) above for measurements and  observations.    Preliminary     Procedures Procedures (including critical care time)  Medications Ordered in ED Medications  doxycycline (VIBRA-TABS) tablet 100 mg (has no administration in time range)    ED Course  I have reviewed the triage vital signs and the nursing notes.  Pertinent labs & imaging results that were available during my care of the patient were reviewed by me and considered in my medical decision making (see chart for details).    MDM Rules/Calculators/A&P                           44 y.o. yo with chief complaint of left foot swelling, redness, warmth for 4 days. Prolonged walking the day symptoms began. No history DVT. No CP, SOB, hemoptysis or other PE symptoms. PERC negative.   Previous medical records available, triage and nursing notes reviewed to obtain more history and assist with MDM  Differential diagnosis: DVT vs cellulitis.  Less likely stress fracture vs soft tissue injury of ankle. No Symptoms of PE. Doubt septic arthritis or abscess.   ER lab work and imaging ordered by triage RN and me, as above  I have personally visualized and interpreted ER diagnostic work up including labs and imaging.    Labs reveal - none obtained   Imaging reveals - LLE Vascular US negative for DVT.  Left foot x-ray negative for osseous abnormalities or obvious subcutaneous abnormalities.   Overall, clinical presentation and ER work up most suggestive of cellulitis. She is calluses, dry cracked skin in feet which predisposes her to cellulitis. Will discharge with doxycycline, NSAIDs, elevation.  Medications ordered - bactrim  Re-evaluated the patient.   Updated to [place] and X-ray results. Discussed plan to discharge with antibiotics, NSAIDs, elevation for suspected cellulitis. Return precautions discussed. Patient in agreement with ER POC and disposition.   Consults made in the ED: vascular ultrasound tech  Critical interventions in the ED: none   Final Clinical  Impression(s) / ED Diagnoses Final diagnoses:  Cellulitis of left foot    Rx / DC Orders ED Discharge Orders         Ordered    doxycycline (VIBRAMYCIN) 100 MG capsule  2 times daily        05/23/20 2022           Jerrell Mylar 05/23/20 2023    Charlynne Pander, MD 05/23/20 [phone]

## 2020-05-23 NOTE — ED Notes (Signed)
Pt has been placed in a gown to all Vascular to complete study.

## 2020-05-23 NOTE — Discharge Instructions (Addendum)
You were seen in the emergency department for left foot redness, warmth, pain and swelling  Vascular ultrasound of the leg did not show any blood clots  X-ray of the foot did not show any bone or soft tissue abnormalities  I think your symptoms are from cellulitis  Take doxycycline 100 mg in the morning and at night for a total of 7 days.  Symptoms should improve after 48 hours of antibiotics.  Return to the ED if symptoms worsen despite antibiotics, you have a fever greater than 100, or any worsening or new symptoms  Elevate your foot  For pain and inflammation you can use a combination of ibuprofen and acetaminophen.  Take [phone] mg acetaminophen (tylenol) every 6 hours or 600 mg ibuprofen (advil, motrin) every 6 hours.  You can take these separately or combine them every 6 hours or alternate them every 3 hours for maximum pain control. Do not exceed 4,000 mg acetaminophen or 2,400 mg ibuprofen in a 24 hour period.  Do not take ibuprofen containing products if you are pregnant or have history of kidney disease, ulcers, GI bleeding, severe acid reflux, or take a blood thinner.  Do not take acetaminophen if you have liver disease.

## 2020-05-23 NOTE — ED Triage Notes (Signed)
Patient c/o pain to left foot with swelling, redness, and warmth x4 days. Pulse present.

## 2020-05-24 ENCOUNTER — Telehealth: Payer: Self-pay | Admitting: *Deleted

## 2020-05-24 NOTE — Telephone Encounter (Signed)
TOC CM received call from pt stating her Rx called to her pharmacy is closed. She is requesting Rx be faxed to [facility] on [address]. Called Rx into [facility]. Updated pt the pharmacy will close today at 6 pm. Isidoro Donning RN CCM, [facility] ED TOC CM [phone]

## 2020-05-28 ENCOUNTER — Telehealth (INDEPENDENT_AMBULATORY_CARE_PROVIDER_SITE_OTHER): Payer: No Payment, Other | Admitting: Psychiatry

## 2020-05-28 ENCOUNTER — Encounter (HOSPITAL_COMMUNITY): Payer: Self-pay | Admitting: Psychiatry

## 2020-05-28 ENCOUNTER — Other Ambulatory Visit: Payer: Self-pay

## 2020-05-28 DIAGNOSIS — F330 Major depressive disorder, recurrent, mild: Secondary | ICD-10-CM | POA: Diagnosis not present

## 2020-05-28 MED ORDER — HYDROXYZINE HCL 25 MG PO TABS: 25.0000 mg | ORAL_TABLET | Freq: Every day | ORAL | 2 refills | Status: DC

## 2020-05-28 MED ORDER — TRAZODONE HCL 50 MG PO TABS
50.0000 mg | ORAL_TABLET | Freq: Every evening | ORAL | 2 refills | Status: DC | PRN
Start: 2020-05-28 — End: 2020-08-26

## 2020-05-28 MED ORDER — DULOXETINE HCL 60 MG PO CPEP
60.0000 mg | ORAL_CAPSULE | Freq: Every day | ORAL | 2 refills | Status: DC
Start: 2020-05-28 — End: 2020-08-26

## 2020-05-28 NOTE — Progress Notes (Signed)
BH MD/PA/NP OP Progress Note Virtual Visit via Video Note  I connected with Catherine Koch on 05/28/20 at 10:00 AM EST by a video enabled telemedicine application and verified that I am speaking with the correct person using two identifiers.  Location: Patient: Home Provider: Clinic   I discussed the limitations of evaluation and management by telemedicine and the availability of in person appointments. The patient expressed understanding and agreed to proceed.  I provided 30 minutes of non-face-to-face time during this encounter.    05/28/2020 10:30 AM Catherine Koch  MRN:  [id number]  Chief Complaint: "I think the medications are working. I don't think changes are needed"  HPI: 45 year old female seen today  for follow-up psychiatric evaluation. She has a history of depression, PTSD,  and anxiety, and is currently managed on Hydroxyzine 25mg  at bedtime, Cymbalta 60mg  daily, and Trazodone 50mg  at bedtime.   On evaluation today, patient is pleasant, cooperative, engaged in conversation, and maintained eye contact. She notes her anxiety and depression has improved since her last visit. She notes that she and her husband are adjusting to a new normal since her mother in law passed away. She notes that they are now remodeling their home which they once live in with her mother in law. She notes that the remodel is tiring and reports that at times she feels overwhelmed with the work that needs to be done. She notes that it is physically and emotionally draining at times. Provider conducted a GAD 7 and patient scored a 9. Provider also conducted a PHQ 9 and patient scored a 13. She denies SI/HI/VAH or paranoia. She notes that her appetite has improved and informed provider that she has been on a diet that is making her feel better. She notes that her sleep is somewhat disrupted noting that she sleeps 4-6 hours. She notes that this could be because having cellulitus in her feet. She is on antibiotic  and notes that it helps with the pain. She also notes that hydroxyzine helps manage her sleep.  Patient notes that recently she spoke to her mother about her gaming and physical health. She notes that she and her mother have a poor relationship. She notes that after speaking to her mother about her health and gaming she felt better because her mother was concerned and supportive and not judgmental.   No medications adjusted at this time. She is agreeable to continue on her current regimen of Cymbalta 60mg  daily, Hydroxyzine 25mg  at bedtime, and Trazodone 50mg  at bedtime. She will follow up with outpatient counseling for therapy.  No other concerns noted at this time.   Visit Diagnosis:    ICD-10-CM   1. Mild episode of recurrent major depressive disorder (HCC)  F33.0 DULoxetine (CYMBALTA) 60 MG capsule    hydrOXYzine (ATARAX/VISTARIL) 25 MG tablet    traZODone (DESYREL) 50 MG tablet    Past Psychiatric History:  PTSD, anxiety, and depression  Past Medical History:  Past Medical History:  Diagnosis Date  . Anxiety   . Arthritis    pt. states in knees  . Chronic gastritis   . Chronic sinus infection   . Closed fracture of right distal fibula 11/19/2013  . Complication of anesthesia    pt. states difficult to wake up  . Depression   . GERD (gastroesophageal reflux disease)   . Headache    pt. states random migraines  . Hemorrhoids   . High cholesterol   . History of bronchitis   .  History of degenerative disc disease   . Hypertension   . Ovarian failure   . PTSD (post-traumatic stress disorder)   . Shortness of breath    exertion from crutches  . Sleep apnea    cpap  . UTI (lower urinary tract infection)   . Wears glasses     Past Surgical History:  Procedure Laterality Date  . BIOPSY  12/10/2019   Procedure: BIOPSY;  Surgeon: Napoleon Form, MD;  Location: [facility];  Service: Endoscopy;;  . CHOLECYSTECTOMY  2010  . CHOLECYSTECTOMY    . COLONOSCOPY WITH PROPOFOL  N/A 12/10/2019   Procedure: COLONOSCOPY WITH PROPOFOL;  Surgeon: Napoleon Form, MD;  Location: [facility];  Service: Endoscopy;  Laterality: N/A;  . ESOPHAGOGASTRODUODENOSCOPY    . ESOPHAGOGASTRODUODENOSCOPY (EGD) WITH PROPOFOL N/A 12/10/2019   Procedure: ESOPHAGOGASTRODUODENOSCOPY (EGD) WITH PROPOFOL;  Surgeon: Napoleon Form, MD;  Location: [facility];  Service: Endoscopy;  Laterality: N/A;  . NASAL SEPTOPLASTY W/ TURBINOPLASTY Bilateral 12/11/2014   Procedure: NASAL SEPTOPLASTY AND BILATERAL INFERIOR TURBINATE REDUCTION;  Surgeon: Osborn Coho, MD;  Location: [facility] OR;  Service: ENT;  Laterality: Bilateral;  . ORIF FIBULA FRACTURE Right 11/19/2013   Procedure: OPEN REDUCTION INTERNAL FIXATION (ORIF) RIGHT FIBULA FRACTURE;  Surgeon: Eulas Post, MD;  Location: [facility] OR;  Service: Orthopedics;  Laterality: Right;  . WISDOM TOOTH EXTRACTION      Family Psychiatric History: Sister anxiety and depression. Notes mother has untreated mental health conditions  Family History:  Family History  Problem Relation Age of Onset  . Allergies Sister   . Heart disease Paternal Grandmother   . Cancer Paternal Grandmother        BREAST CANCER  . Cancer Paternal Grandfather     Social History:  Social History   Socioeconomic History  . Marital status: Married    Spouse name: Not on file  . Number of children: Not on file  . Years of education: Not on file  . Highest education level: Not on file  Occupational History    Comment: UNEMPOLYEED   Tobacco Use  . Smoking status: Former Smoker    Types: Cigarettes    Quit date: 05/24/2010    Years since quitting: 10.0  . Smokeless tobacco: Never Used  . Tobacco comment: Pt. states she used to be a social smoker  Vaping Use  . Vaping Use: Never used  Substance and Sexual Activity  . Alcohol use: Not Currently  . Drug use: No  . Sexual activity: Yes    Birth control/protection: None  Other Topics Concern  . Not on file  Social  History Narrative  . Not on file   Social Determinants of Health   Financial Resource Strain: Not on file  Food Insecurity: Not on file  Transportation Needs: Not on file  Physical Activity: Not on file  Stress: Not on file  Social Connections: Not on file    Allergies:  Allergies  Allergen Reactions  . Sulfa Antibiotics Anaphylaxis, Swelling and Rash    Throat swelling/ rashes    Metabolic Disorder Labs: No results found for: HGBA1C, MPG No results found for: PROLACTIN No results found for: CHOL, TRIG, HDL, CHOLHDL, VLDL, LDLCALC Lab Results  Component Value Date   TSH 1.90 04/11/2012    Therapeutic Level Labs: No results found for: LITHIUM No results found for: VALPROATE No components found for:  CBMZ  Current Medications: Current Outpatient Medications  Medication Sig Dispense Refill  . doxycycline (VIBRAMYCIN) 100 MG capsule  Take 1 capsule (100 mg total) by mouth 2 (two) times daily. 20 capsule 0  . DULoxetine (CYMBALTA) 60 MG capsule Take 1 capsule (60 mg total) by mouth daily. 30 capsule 2  . fluticasone (FLONASE) 50 MCG/ACT nasal spray Place 1 spray into both nostrils daily. (Patient taking differently: Place 1 spray into both nostrils daily as needed for allergies. ) 16 g 0  . hydrOXYzine (ATARAX/VISTARIL) 25 MG tablet Take 1 tablet (25 mg total) by mouth at bedtime. 30 tablet 2  . omeprazole (PRILOSEC) 40 MG capsule Take 1 capsule (40 mg total) by mouth daily. 90 capsule 3  . ondansetron (ZOFRAN ODT) 4 MG disintegrating tablet Take 1 tablet (4 mg total) by mouth every 8 (eight) hours as needed for nausea or vomiting. 20 tablet 0  . traZODone (DESYREL) 50 MG tablet Take 1 tablet (50 mg total) by mouth at bedtime as needed for sleep. 30 tablet 2  . Turmeric (QC TUMERIC COMPLEX PO) Take 1 tablet by mouth daily.     No current facility-administered medications for this visit.     Musculoskeletal: Strength & Muscle Tone: Unable to assess due to telehealth  visit Gait & Station: Unable to assess due to telehealth visit Patient leans: N/A  Psychiatric Specialty Exam: Review of Systems  Last menstrual period 06/29/2013.There is no height or weight on file to calculate BMI.  General Appearance: Well Groomed  Eye Contact:  Good  Speech:  Clear and Coherent and Normal Rate  Volume:  Normal  Mood:  Euphoric  Affect:  Congruent  Thought Process:  Coherent, Goal Directed and Linear  Orientation:  Full (Time, Place, and Person)  Thought Content: WDL and Logical   Suicidal Thoughts:  No  Homicidal Thoughts:  No  Memory:  Immediate;   Good Recent;   Good Remote;   Good  Judgement:  Good  Insight:  Good  Psychomotor Activity:  Normal  Concentration:  Concentration: Good and Attention Span: Good  Recall:  Good  Fund of Knowledge: Good  Language: Good  Akathisia:  No  Handed:  Right  AIMS (if indicated): Not done  Assets:  Communication Skills Desire for Improvement Financial Resources/Insurance Housing Intimacy Social Support  ADL's:  Intact  Cognition: WNL  Sleep:  Fair   Screenings: GAD-7   Flowsheet Row Video Visit [facility]/28/2020 in [facility]  Total GAD-7 Score 9    PHQ2-9   Flowsheet Row [facility] Visit [facility] 05/28/2020 in [facility]/18/2013 [facility]  PHQ-2 Total Score 2 2  PHQ-9 Total Score 13 7       Assessment and Plan: Patient reports she occassionally is anxious and depressed however notes that over all she is doing well. No medication changes made today. Patient will continue medications as prescribed.   1. Mild episode of recurrent major depressive disorder (HCC)  Continue- DULoxetine (CYMBALTA) 60 MG capsule; Take 1 capsule (60 mg total) by mouth daily.  Dispense: 30 capsule; Refill: 2 Continue- hydrOXYzine (ATARAX/VISTARIL) 25 MG tablet; Take 1 tablet (25 mg total) by mouth at bedtime.  Dispense: 30 tablet;  Refill: 2 Continue- traZODone (DESYREL) 50 MG tablet; Take 1 tablet (50 mg total) by mouth at bedtime as needed for sleep.  Dispense: 30 tablet; Refill: 2   Follow-up in 3 months Follow-up with therapy   Shanna Cisco, NP 05/28/2020, 10:30 AM

## 2020-06-09 ENCOUNTER — Ambulatory Visit (HOSPITAL_COMMUNITY): Payer: Self-pay | Admitting: Licensed Clinical Social Worker

## 2020-07-14 ENCOUNTER — Other Ambulatory Visit: Payer: Self-pay

## 2020-07-14 ENCOUNTER — Ambulatory Visit
Admission: EM | Admit: 2020-07-14 | Discharge: 2020-07-14 | Disposition: A | Discharge status: Home / Self Care | Payer: Medicaid Other

## 2020-07-14 DIAGNOSIS — Z20822 Contact with and (suspected) exposure to covid-19: Secondary | ICD-10-CM

## 2020-07-14 DIAGNOSIS — B9789 Other viral agents as the cause of diseases classified elsewhere: Secondary | ICD-10-CM

## 2020-07-14 DIAGNOSIS — J988 Other specified respiratory disorders: Secondary | ICD-10-CM

## 2020-07-14 NOTE — ED Triage Notes (Signed)
Pt c/o productive cough with yellow sputum, fever, runny nose, body aches, headache, and fatigue since Friday. States did a tele doc and tx'd for cough and viral infection. Using tylenol and mucinex.

## 2020-07-14 NOTE — ED Provider Notes (Signed)
[facility]    CSN: [id number] Arrival date & time: 07/14/20  1005      History   Chief Complaint Chief Complaint  Patient presents with  . Cough    HPI Catherine Koch is a 45 y.o. female.   Who presents with a 3 day history of cough (non-productive), fever with a T-Max of 101, nasal congestion and sore throat. Generalized malaise. No known exposures. She did have Covid vaccines without booster. She did a telemed visit on Sunday and was diagnosed with a viral infection.      Past Medical History:  Diagnosis Date  . Anxiety   . Arthritis    pt. states in knees  . Chronic gastritis   . Chronic sinus infection   . Closed fracture of right distal fibula 11/19/2013  . Complication of anesthesia    pt. states difficult to wake up  . Depression   . GERD (gastroesophageal reflux disease)   . Headache    pt. states random migraines  . Hemorrhoids   . High cholesterol   . History of bronchitis   . History of degenerative disc disease   . Hypertension   . Ovarian failure   . PTSD (post-traumatic stress disorder)   . Shortness of breath    exertion from crutches  . Sleep apnea    cpap  . UTI (lower urinary tract infection)   . Wears glasses     Patient Active Problem List   Diagnosis Date Noted  . Mild episode of recurrent major depressive disorder (HCC) 02/26/2020  . LLQ abdominal pain   . Rectal bleeding   . Gastritis and gastroduodenitis   . Nausea without vomiting   . LUQ abdominal pain   . Deviated nasal septum 12/11/2014    Class: Chronic  . Closed fracture of right distal fibula 11/19/2013  . S/P ORIF (open reduction internal fixation) fracture 11/19/2013  . Obesity, morbid (HCC) 06/17/2013  . Chronic rhinitis 06/17/2013  . Acute maxillary sinusitis 06/03/2012  . Obstructive sleep apnea 01/15/2012  . Depression 01/15/2012    Past Surgical History:  Procedure Laterality Date  . BIOPSY  12/10/2019   Procedure: BIOPSY;  Surgeon:  Napoleon Form, MD;  Location: [facility];  Service: Endoscopy;;  . CHOLECYSTECTOMY  2010  . CHOLECYSTECTOMY    . COLONOSCOPY WITH PROPOFOL N/A 12/10/2019   Procedure: COLONOSCOPY WITH PROPOFOL;  Surgeon: Napoleon Form, MD;  Location: [facility];  Service: Endoscopy;  Laterality: N/A;  . ESOPHAGOGASTRODUODENOSCOPY    . ESOPHAGOGASTRODUODENOSCOPY (EGD) WITH PROPOFOL N/A 12/10/2019   Procedure: ESOPHAGOGASTRODUODENOSCOPY (EGD) WITH PROPOFOL;  Surgeon: Napoleon Form, MD;  Location: [facility];  Service: Endoscopy;  Laterality: N/A;  . NASAL SEPTOPLASTY W/ TURBINOPLASTY Bilateral 12/11/2014   Procedure: NASAL SEPTOPLASTY AND BILATERAL INFERIOR TURBINATE REDUCTION;  Surgeon: Osborn Coho, MD;  Location: [facility] OR;  Service: ENT;  Laterality: Bilateral;  . ORIF FIBULA FRACTURE Right 11/19/2013   Procedure: OPEN REDUCTION INTERNAL FIXATION (ORIF) RIGHT FIBULA FRACTURE;  Surgeon: Eulas Post, MD;  Location: [facility] OR;  Service: Orthopedics;  Laterality: Right;  . WISDOM TOOTH EXTRACTION      OB History    Gravida  0   Para  0   Term  0   Preterm  0   AB  0   Living  0     SAB  0   IAB  0   Ectopic  0   Multiple  0   Live Births  0            Home Medications    Prior to Admission medications   Medication Sig Start Date End Date Taking? Authorizing Provider  DULoxetine (CYMBALTA) 60 MG capsule Take 1 capsule (60 mg total) by mouth daily. 05/28/20   Shanna Cisco, NP  fluticasone (FLONASE) 50 MCG/ACT nasal spray Place 1 spray into both nostrils daily. Patient taking differently: Place 1 spray into both nostrils daily as needed for allergies.  04/22/17   Caccavale, Sophia, PA-C  hydrOXYzine (ATARAX/VISTARIL) 25 MG tablet Take 1 tablet (25 mg total) by mouth at bedtime. 05/28/20   Shanna Cisco, NP  omeprazole (PRILOSEC) 40 MG capsule Take 1 capsule (40 mg total) by mouth daily. 12/10/19   Napoleon Form, MD  traZODone (DESYREL) 50 MG tablet  Take 1 tablet (50 mg total) by mouth at bedtime as needed for sleep. 05/28/20   Shanna Cisco, NP  Turmeric (QC TUMERIC COMPLEX PO) Take 1 tablet by mouth daily.    [provider]  ranitidine (ZANTAC) 150 MG tablet Take 1 tablet (150 mg total) by mouth 2 (two) times daily. 10/25/17 08/04/19  Muthersbaugh, Dahlia Client, PA-C    Family History Family History  Problem Relation Age of Onset  . Allergies Sister   . Heart disease Paternal Grandmother   . Cancer Paternal Grandmother        BREAST CANCER  . Cancer Paternal Grandfather     Social History Social History   Tobacco Use  . Smoking status: Former Smoker    Types: Cigarettes    Quit date: 05/24/2010    Years since quitting: 10.1  . Smokeless tobacco: Never Used  . Tobacco comment: Pt. states she used to be a social smoker  Vaping Use  . Vaping Use: Never used  Substance Use Topics  . Alcohol use: Not Currently  . Drug use: No     Allergies   Sulfa antibiotics   Review of Systems Review of Systems  Constitutional: Positive for fatigue and fever.  HENT: Positive for congestion and sore throat.   Eyes: Negative.   Respiratory: Positive for cough. Negative for shortness of breath.   Musculoskeletal: Negative.   Skin: Negative for rash.  All other systems reviewed and are negative.    Physical Exam Triage Vital Signs ED Triage Vitals [07/14/20 1024]  Enc Vitals Group     BP (!) 186/103     Pulse Rate 87     Resp 18     Temp 99.1 F (37.3 C)     Temp Source Oral     SpO2 95 %     Weight      Height      Head Circumference      Peak Flow      Pain Score 8     Pain Loc      Pain Edu?      Excl. in GC?    No data found.  Updated Vital Signs BP (!) 186/103 (BP Location: Left Arm)   Pulse 87   Temp 99.1 F (37.3 C) (Oral)   Resp 18   LMP 06/29/2013 Comment: waiver signed  SpO2 95%   Visual Acuity Right Eye Distance:   Left Eye Distance:   Bilateral Distance:    Right Eye Near:   Left Eye  Near:    Bilateral Near:     Physical Exam Vitals and nursing note reviewed.  Constitutional:  General: She is not in acute distress.    Appearance: She is obese. She is not ill-appearing or toxic-appearing.  HENT:     Head: Normocephalic and atraumatic.     Nose: Congestion and rhinorrhea present.     Mouth/Throat:     Mouth: Mucous membranes are moist.     Pharynx: Oropharynx is clear. No oropharyngeal exudate or posterior oropharyngeal erythema.  Cardiovascular:     Rate and Rhythm: Normal rate and regular rhythm.  Pulmonary:     Effort: Pulmonary effort is normal. No respiratory distress.     Breath sounds: Normal breath sounds. No stridor.  Musculoskeletal:     Cervical back: Normal range of motion.  Lymphadenopathy:     Cervical: No cervical adenopathy.  Skin:    General: Skin is warm and dry.  Neurological:     Mental Status: She is alert.  Psychiatric:        Mood and Affect: Mood normal.        Behavior: Behavior normal.      UC Treatments / Results  Labs (all labs ordered are listed, but only abnormal results are displayed) Labs Reviewed  COVID-19, FLU A+B NAA    EKG   Radiology No results found.  Procedures Procedures (including critical care time)  Medications Ordered in UC Medications - No data to display  Initial Impression / Assessment and Plan / UC Course  I have reviewed the triage vital signs and the nursing notes.  Pertinent labs & imaging results that were available during my care of the patient were reviewed by me and considered in my medical decision making (see chart for details).     No evidence of a bacterial infection by exam. Covid testing and treat symptomatically. FU on blood pressure with history of HTN on no current medications at this time FU with PCP for this. IF worsens f/u here or with PCP Final Clinical Impressions(s) / UC Diagnoses   Final diagnoses:  Encounter for screening laboratory testing for COVID-19 virus    Discharge Instructions   None    ED Prescriptions    None     PDMP not reviewed this encounter.   Riki Sheer, [place] 07/14/20 1122

## 2020-07-14 NOTE — Discharge Instructions (Addendum)
No indication for an antibiotic at this time. No evidence of pneumonia, but if fever continues past 1 week and cough is worsening then please get rechecked. Delsym is the BEST cough medication over the counter and works well. Also adding Elderberry daily and Vitamin C and Zinc will be helpful as well.

## 2020-07-15 ENCOUNTER — Ambulatory Visit (HOSPITAL_COMMUNITY): Payer: No Payment, Other | Admitting: Licensed Clinical Social Worker

## 2020-07-15 ENCOUNTER — Telehealth (HOSPITAL_COMMUNITY): Payer: Self-pay | Admitting: Licensed Clinical Social Worker

## 2020-07-15 LAB — COVID-19, FLU A+B NAA
Influenza A, NAA: NOT DETECTED
Influenza B, NAA: NOT DETECTED
SARS-CoV-2, NAA: DETECTED — AB

## 2020-07-15 NOTE — Telephone Encounter (Signed)
This date pt accidentally scheduled during a meeting time. She was called early this morning to advise of need to reschedule which was left on vm by scheduling staff. LCSW could see pt is logged on for session before 3p. Logged on long enough to advise of situation. Pt states she had not heard vm. She is notably ill and lying down. Pt confirms she is ill and she is awaiting COVID and FLU test results. Pt understanding of scheduling error. Reviewed next appt time/date.

## 2020-08-04 ENCOUNTER — Ambulatory Visit (INDEPENDENT_AMBULATORY_CARE_PROVIDER_SITE_OTHER): Payer: No Payment, Other | Admitting: Licensed Clinical Social Worker

## 2020-08-04 ENCOUNTER — Other Ambulatory Visit: Payer: Self-pay

## 2020-08-04 DIAGNOSIS — F418 Other specified anxiety disorders: Secondary | ICD-10-CM
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
## 2020-08-26 ENCOUNTER — Encounter (HOSPITAL_COMMUNITY): Payer: Self-pay | Admitting: Psychiatry

## 2020-08-26 ENCOUNTER — Telehealth (INDEPENDENT_AMBULATORY_CARE_PROVIDER_SITE_OTHER): Payer: No Payment, Other | Admitting: Psychiatry

## 2020-08-26 ENCOUNTER — Other Ambulatory Visit: Payer: Self-pay

## 2020-08-26 DIAGNOSIS — F411 Generalized anxiety disorder: Secondary | ICD-10-CM | POA: Diagnosis not present

## 2020-08-26 DIAGNOSIS — F331 Major depressive disorder, recurrent, moderate: Secondary | ICD-10-CM | POA: Insufficient documentation

## 2020-08-26 MED ORDER — TRAZODONE HCL 100 MG PO TABS: 100.0000 mg | ORAL_TABLET | Freq: Every evening | ORAL | 2 refills | Status: DC | PRN

## 2020-08-26 MED ORDER — DULOXETINE HCL 40 MG PO CPEP: 80.0000 mg | ORAL_CAPSULE | Freq: Every day | ORAL | 2 refills | Status: DC

## 2020-08-26 MED ORDER — HYDROXYZINE HCL 25 MG PO TABS: 25.0000 mg | ORAL_TABLET | Freq: Every day | ORAL | 2 refills | Status: DC
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
## 2020-09-04 ENCOUNTER — Ambulatory Visit (INDEPENDENT_AMBULATORY_CARE_PROVIDER_SITE_OTHER): Payer: No Payment, Other | Admitting: Licensed Clinical Social Worker

## 2020-09-04 ENCOUNTER — Other Ambulatory Visit: Payer: Self-pay

## 2020-09-04 DIAGNOSIS — F418 Other specified anxiety disorders: Secondary | ICD-10-CM

## 2020-09-08 NOTE — Progress Notes (Signed)
   THERAPIST PROGRESS NOTE   Virtual Visit via Video Note  I connected with Catherine Koch on 09/04/20 at  4:00 PM EDT by a video enabled telemedicine application and verified that I am speaking with the correct person using two identifiers.  Location: Patient: Home Provider: [facility]   I discussed the limitations of evaluation and management by telemedicine and the availability of in person appointments. The patient expressed understanding and agreed to proceed. I discussed the assessment and treatment plan with the patient. The patient was provided an opportunity to ask questions and all were answered. The patient agreed with the plan and demonstrated an understanding of the instructions.  I provided 45 minutes of non-face-to-face time during this encounter.  Participation Level: Active  Behavioral Response: CasualAlertAnxious and Depressed  Type of Therapy: Individual Therapy  Treatment Goals addressed: Communication: dep/anx/coping  Interventions: Supportive and Reframing  Summary: Catherine Koch is a 45 y.o. female who presents with hx of dep/anx. This date pt signs on for video session per her preference. Pt states she is managing adequately with dep/anx. She reports she had med adjustment 04/05 and feels this will be a help. Pt reports ongoing financial stress. She reports she is continuing to grieve the loss of mil and now feels a loss r/Catherine Catherine Koch and what she thought they had as a sister-like relationship. Pt states flashbacks are better when asked. LCSW provided grief education/counseling. Pt reports she did allow herself to express some emotion recently and how much it helped. LCSW encouraged expressing not suppressing. LCSW mailed grief literature to pt post session. Pt will consider journaling and letter writing. Pt reports she and spouse are going to be taking in another roommate. This is an Chartered loss adjuster "adopted dtr" named "Catherine Koch". Pt reports this person is 26. She states  they have been associating online 10 yrs. Pt reports Catherine Koch is in a bad situation with housing living in [place] and has "bad parents". This woman is on disability for physical and mental health reasons. Pt and spouse also allowing her to bring her girlfriend. When asked about this decision making process given what they have just been through with Catherine Koch, pt states "We promised her she would always have a place with [place] if she ever needed it." Pt states she did have some reservations but spouse said "we promised". Pt reports spouse is planning to have a signed agreement in place and Catherine Koch will be paying rent. Pt advises she has an appt with PCP tomorrow. She states she f/u with [employer] for update and found her case was closed. Pt provides many details of how she was not informed, no letter and no return calls. She states she finally got someone who has opened her case back up and it's "under investigation". Pt working with Therapist, music. Pt and spouse planning to go to her parent's home for Easter. She denies other worries/concerns. LCSW reviewed poc including scheduling prior to close of session. Pt states appreciation for care.   Suicidal/Homicidal: Nowithout intent/plan  Therapist Response: Pt remains receptive to care.  Plan: Return again in ~4 weeks.  Diagnosis: Axis I: Depression with anx   Sink, LCSW 09/08/2020

## 2020-09-10 ENCOUNTER — Other Ambulatory Visit (HOSPITAL_COMMUNITY): Payer: Self-pay | Admitting: Urgent Care

## 2020-09-10 ENCOUNTER — Other Ambulatory Visit: Payer: Self-pay | Admitting: Urgent Care

## 2020-09-10 DIAGNOSIS — E2839 Other primary ovarian failure: Secondary | ICD-10-CM

## 2020-09-25 ENCOUNTER — Other Ambulatory Visit: Payer: Self-pay

## 2020-09-25 ENCOUNTER — Ambulatory Visit (INDEPENDENT_AMBULATORY_CARE_PROVIDER_SITE_OTHER): Payer: No Payment, Other | Admitting: Licensed Clinical Social Worker

## 2020-09-25 DIAGNOSIS — F418 Other specified anxiety disorders: Secondary | ICD-10-CM

## 2020-09-26 ENCOUNTER — Ambulatory Visit (HOSPITAL_COMMUNITY)
Admission: RE | Admit: 2020-09-26 | Discharge: 2020-09-26 | Disposition: A | Discharge status: Home / Self Care | Payer: Self-pay | Source: Ambulatory Visit | Attending: Urgent Care | Admitting: Urgent Care

## 2020-09-26 ENCOUNTER — Other Ambulatory Visit: Payer: Self-pay

## 2020-09-26 DIAGNOSIS — E2839 Other primary ovarian failure: Secondary | ICD-10-CM | POA: Insufficient documentation

## 2020-09-26 MED ORDER — GADOBUTROL 1 MMOL/ML IV SOLN
10.0000 mL | Freq: Once | INTRAVENOUS | Status: AC | PRN
  Administered 2020-09-26: 10 mL via INTRAVENOUS

## 2020-10-01 NOTE — Progress Notes (Signed)
THERAPIST PROGRESS NOTE   Virtual Visit via Video Note  I connected with Catherine Koch on 09/25/20 at 10:00 AM EDT by a video enabled telemedicine application and verified that I am speaking with the correct person using two identifiers.  Location: Patient: Home Provider: [facility]   I discussed the limitations of evaluation and management by telemedicine and the availability of in person appointments. The patient expressed understanding and agreed to proceed. I discussed the assessment and treatment plan with the patient. The patient was provided an opportunity to ask questions and all were answered. The patient agreed with the plan and demonstrated an understanding of the instructions.   I provided 40 minutes of non-face-to-face time during this encounter.  Participation Level: Active  Behavioral Response: CasualDrowsyMildly dep  Type of Therapy: Individual Therapy  Treatment Goals addressed: Communication: dep/anx/coping  Interventions: Supportive  Summary: Catherine Koch is a 45 y.o. female who presents with hx of dep/anx. This date pt signs on for video session. She reports she does have new roommates that moved in April 29th but she is in a private location. Pt advises the new roommates are working out. She states they are currently staying on pull out in living room while pt/spouse finish moving into Child psychotherapist. Roommates will then get their old room. Pt reports they are "helping around the house". She states there is no tension or relationship issues to date. Pt states it is "nice" to have them in the home. LCSW assessed for signed agreement, which pt denies saying "not yet". LCSW assessed for receipt of grief lit mailed to pt. Pt confirms receipt and states she has not read it yet. She advises she thought she would wait and read it with spouse. LCSW recommended pt read it on her own in addition to sharing with spouse. Pt states intent to do so. Pt reports she did go to [employer] for  Easter. States her mother continues to try to tell her what to do despite her being in her 28's. Pt states mother always suggests she get a job. Pt reports she is still trying to pursue disability. She reports she did ask mother for some financial help lately and mother asks a lot of questions about how she spends money when given, which frustrates pt. Pt reports this is a loan and hopes mil's estate will be settled soon in order to repay loan. Pt advises spouse will soon be out of work for the summer since school closing. 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 Anniversary is 5/29. Pt continues to game and have poor sleep habits. She is again yawning throughout session. LCSW assessed for pt being able to work with online job. Pt feels she would not be able to attend to work. When assessed how she can focus on gaming for long periods pt states it is something she "enjoys", it is "simpler" and it comes with fewer consequences if she were to "mess up". LCSW assessed for other worries/concerns. Pt states she has MRI of brain tomorrow. She has had MRI before and knows what it is like. Pt continues meds as prescribed. Has been more distracted with new roommates. Coping appears adequate. LCSW reviewed poc prior to close of session. Pt states appreciation for care.   Suicidal/Homicidal: Nowithout intent/plan  Therapist Response: Pt remains somewhat engaged in counseling  Plan: Return again in 4 weeks.  Diagnosis: Axis I: depression with anxiety  McDonough Sink, LCSW  10/01/2020  

## 2020-10-28 ENCOUNTER — Other Ambulatory Visit (HOSPITAL_COMMUNITY): Payer: Self-pay | Admitting: Psychiatry

## 2020-10-28 DIAGNOSIS — F330 Major depressive disorder, recurrent, mild: Secondary | ICD-10-CM

## 2020-11-06 ENCOUNTER — Other Ambulatory Visit: Payer: Self-pay

## 2020-11-06 ENCOUNTER — Ambulatory Visit (INDEPENDENT_AMBULATORY_CARE_PROVIDER_SITE_OTHER): Payer: No Payment, Other | Admitting: Licensed Clinical Social Worker

## 2020-11-06 DIAGNOSIS — F418 Other specified anxiety disorders: Secondary | ICD-10-CM

## 2020-11-11 ENCOUNTER — Encounter (HOSPITAL_COMMUNITY): Payer: Self-pay

## 2020-11-11 ENCOUNTER — Emergency Department (HOSPITAL_COMMUNITY)
Admission: EM | Admit: 2020-11-11 | Discharge: 2020-11-12 | Disposition: A | Discharge status: Home / Self Care | Payer: Self-pay | Attending: Emergency Medicine | Admitting: Emergency Medicine

## 2020-11-11 ENCOUNTER — Other Ambulatory Visit: Payer: Self-pay

## 2020-11-11 ENCOUNTER — Emergency Department (HOSPITAL_COMMUNITY): Payer: Self-pay

## 2020-11-11 DIAGNOSIS — R197 Diarrhea, unspecified: Secondary | ICD-10-CM | POA: Insufficient documentation

## 2020-11-11 DIAGNOSIS — M545 Low back pain, unspecified: Secondary | ICD-10-CM | POA: Insufficient documentation

## 2020-11-11 DIAGNOSIS — R7989 Other specified abnormal findings of blood chemistry: Secondary | ICD-10-CM

## 2020-11-11 DIAGNOSIS — R7401 Elevation of levels of liver transaminase levels: Secondary | ICD-10-CM | POA: Insufficient documentation

## 2020-11-11 DIAGNOSIS — I10 Essential (primary) hypertension: Secondary | ICD-10-CM | POA: Insufficient documentation

## 2020-11-11 DIAGNOSIS — Z87891 Personal history of nicotine dependence: Secondary | ICD-10-CM | POA: Insufficient documentation

## 2020-11-11 DIAGNOSIS — R509 Fever, unspecified: Secondary | ICD-10-CM | POA: Insufficient documentation

## 2020-11-11 DIAGNOSIS — Z20822 Contact with and (suspected) exposure to covid-19: Secondary | ICD-10-CM | POA: Insufficient documentation

## 2020-11-11 DIAGNOSIS — R112 Nausea with vomiting, unspecified: Secondary | ICD-10-CM | POA: Insufficient documentation

## 2020-11-11 DIAGNOSIS — R109 Unspecified abdominal pain: Secondary | ICD-10-CM

## 2020-11-11 DIAGNOSIS — M549 Dorsalgia, unspecified: Secondary | ICD-10-CM

## 2020-11-11 DIAGNOSIS — K219 Gastro-esophageal reflux disease without esophagitis: Secondary | ICD-10-CM | POA: Insufficient documentation

## 2020-11-11 LAB — URINALYSIS, ROUTINE W REFLEX MICROSCOPIC
Bacteria, UA: NONE SEEN
Bilirubin Urine: NEGATIVE
Glucose, UA: NEGATIVE mg/dL
Hgb urine dipstick: NEGATIVE
Ketones, ur: NEGATIVE mg/dL
Nitrite: NEGATIVE
Protein, ur: NEGATIVE mg/dL
Specific Gravity, Urine: 1.03 (ref 1.005–1.030)
pH: 5 (ref 5.0–8.0)

## 2020-11-11 LAB — COMPREHENSIVE METABOLIC PANEL
ALT: 136 U/L — ABNORMAL HIGH (ref 0–44)
AST: 110 U/L — ABNORMAL HIGH (ref 15–41)
Albumin: 4.3 g/dL (ref 3.5–5.0)
Alkaline Phosphatase: 68 U/L (ref 38–126)
Anion gap: 11 (ref 5–15)
BUN: 13 mg/dL (ref 6–20)
CO2: 25 mmol/L (ref 22–32)
Calcium: 9.1 mg/dL (ref 8.9–10.3)
Chloride: 99 mmol/L (ref 98–111)
Creatinine, Ser: 0.95 mg/dL (ref 0.44–1.00)
GFR, Estimated: >60 mL/min (ref >60)
Glucose, Bld: 105 mg/dL — ABNORMAL HIGH (ref 70–99)
Potassium: 4.1 mmol/L (ref 3.5–5.1)
Sodium: 135 mmol/L (ref 135–145)
Total Bilirubin: 0.8 mg/dL (ref 0.3–1.2)
Total Protein: 8.3 g/dL — ABNORMAL HIGH (ref 6.5–8.1)

## 2020-11-11 LAB — CBC WITH DIFFERENTIAL/PLATELET
Abs Immature Granulocytes: 0.02 10*3/uL (ref 0.00–0.07)
Basophils Absolute: 0 10*3/uL (ref 0.0–0.1)
Basophils Relative: 0 %
Eosinophils Absolute: 0.1 10*3/uL (ref 0.0–0.5)
Eosinophils Relative: 1 %
HCT: 43.6 % (ref 36.0–46.0)
Hemoglobin: 14 g/dL (ref 12.0–15.0)
Immature Granulocytes: 0 %
Lymphocytes Relative: 25 %
Lymphs Abs: 1.7 10*3/uL (ref 0.7–4.0)
MCH: 29.8 pg (ref 26.0–34.0)
MCHC: 32.1 g/dL (ref 30.0–36.0)
MCV: 92.8 fL (ref 80.0–100.0)
Monocytes Absolute: 0.7 10*3/uL (ref 0.1–1.0)
Monocytes Relative: 11 %
Neutro Abs: 4.3 10*3/uL (ref 1.7–7.7)
Neutrophils Relative %: 63 %
Platelets: 265 10*3/uL (ref 150–400)
RBC: 4.7 MIL/uL (ref 3.87–5.11)
RDW: 13.8 % (ref 11.5–15.5)
WBC: 6.8 10*3/uL (ref 4.0–10.5)
nRBC: 0 % (ref 0.0–0.2)

## 2020-11-11 LAB — RESP PANEL BY RT-PCR (FLU A&B, COVID) ARPGX2
Influenza A by PCR: NEGATIVE
Influenza B by PCR: NEGATIVE
SARS Coronavirus 2 by RT PCR: NEGATIVE

## 2020-11-11 LAB — LIPASE, BLOOD: Lipase: 25 U/L (ref 11–51)

## 2020-11-11 LAB — PREGNANCY, URINE: Preg Test, Ur: NEGATIVE

## 2020-11-11 MED ORDER — SODIUM CHLORIDE 0.9 % IV SOLN
1.0000 g | Freq: Once | INTRAVENOUS | Status: AC
  Administered 2020-11-11: 1 g via INTRAVENOUS
  Filled 2020-11-11: qty 10

## 2020-11-11 MED ORDER — ONDANSETRON HCL 4 MG/2ML IJ SOLN
4.0000 mg | Freq: Once | INTRAMUSCULAR | Status: AC
Start: 2020-11-11 — End: 2020-11-11
  Administered 2020-11-11: 4 mg via INTRAVENOUS
  Filled 2020-11-11: qty 2

## 2020-11-11 MED ORDER — ONDANSETRON 4 MG PO TBDP: 4.0000 mg | ORAL_TABLET | Freq: Three times a day (TID) | ORAL | 0 refills | Status: DC | PRN

## 2020-11-11 MED ORDER — SODIUM CHLORIDE 0.9 % IV BOLUS
1000.0000 mL | Freq: Once | INTRAVENOUS | Status: AC
  Administered 2020-11-11: 1000 mL via INTRAVENOUS

## 2020-11-11 MED ORDER — MORPHINE SULFATE (PF) 4 MG/ML IV SOLN
4.0000 mg | Freq: Once | INTRAVENOUS | Status: AC
  Administered 2020-11-11: 4 mg via INTRAVENOUS
  Filled 2020-11-11: qty 1

## 2020-11-11 MED ORDER — NAPROXEN 500 MG PO TABS: 500.0000 mg | ORAL_TABLET | Freq: Two times a day (BID) | ORAL | 0 refills | Status: DC | PRN

## 2020-11-11 MED ORDER — IOHEXOL 300 MG/ML  SOLN
100.0000 mL | Freq: Once | INTRAMUSCULAR | Status: AC | PRN
  Administered 2020-11-11: 100 mL via INTRAVENOUS

## 2020-11-11 MED ORDER — CEFPODOXIME PROXETIL 200 MG PO TABS: 200.0000 mg | ORAL_TABLET | Freq: Two times a day (BID) | ORAL | 0 refills | Status: DC

## 2020-11-11 NOTE — ED Provider Notes (Signed)
Emergency Medicine Provider Triage Evaluation Note  Catherine Koch , a 45 y.o. female  was evaluated in triage.  Pt complains of 1 day hx pain. Woke up yesterday hurting. Generalized. 102.4 T-Max. Emesis x1, nonbloody.  Lack of appetite, ongoing nausea.  Called EMS yesterday but decided to sleep off.  Chills. Abdominal bloating.  Headaches.  Fatigued.   Review of Systems  Positive: HA, fatigue, nausea, abdominal bloating, generalized body aches. Negative: Cough, hemoptysis, unilateral swelling.  Physical Exam  BP (!) 155/98   Pulse 85   Temp 98.8 F (37.1 C) (Oral)   Resp 16   Ht 5\' 8"  (1.727 m)   Wt (!) 167.8 kg   LMP 06/29/2013 Comment: waiver signed  SpO2 97%   BMI 56.26 kg/m  Gen:   Awake, no distress   Resp:  Normal effort  MSK:   Moves extremities without difficulty  Other:    Medical Decision Making  Medically screening exam initiated at 2:47 PM.  Appropriate orders placed.  Catherine Koch was informed that the remainder of the evaluation will be completed by another provider, this initial triage assessment does not replace that evaluation, and the importance of remaining in the ED until their evaluation is complete.     [name], PA-C 11/11/20 1453    11/13/20, MD 11/11/20 [phone]

## 2020-11-11 NOTE — ED Triage Notes (Addendum)
Patient c/o left lower back pain. left flank pain and mid abdominal pain with bloating. Patient states a history of frequent UTI's Patient states she called EMS last night because of the pain and states she vomited x 1 yesterday. Patient states one episode of dairrhea today.

## 2020-11-11 NOTE — Discharge Instructions (Addendum)
You were seen in the emergency department today for abdominal pain, back pain, fever, and vomiting.  Your CT scan did show some fatty liver changes and your labs show that your liver function tests were elevated.  Please have these rechecked by your primary care provider.   We are sending you home with cefpodoxime to treat for a kidney infection.  Please take this twice per day for the next 10 days.  We are also sending you with Zofran to take every 8 hours as needed for nausea and vomiting and naproxen to take every 12 hours as needed for pain.  Take naproxen with food as it can cause stomach upset and it or stomach bleeding.  Do not take other NSAIDs such as Advil, Motrin, ibuprofen, Goody powder, Mobic, etc. as these are similar medicines.  We have prescribed you new medication(s) today. Discuss the medications prescribed today with your pharmacist as they can have adverse effects and interactions with your other medicines including over the counter and prescribed medications. Seek medical evaluation if you start to experience new or abnormal symptoms after taking one of these medicines, seek care immediately if you start to experience difficulty breathing, feeling of your throat closing, facial swelling, or rash as these could be indications of a more serious allergic reaction  Please drink plenty of electrolyte containing fluids.  Get plenty of rest.  Follow-up primary care within 3 days.  Return to the ER for new or worsening symptoms including but not limited to new or worsening pain, inability to walk, inability to keep fluids down, blood in vomit or stool, passing out, recurrence of fever, or any other concerns.

## 2020-11-11 NOTE — ED Provider Notes (Signed)
[name] Provider Note   CSN: [id number] Arrival date & time: 11/11/20  1302     History Chief Complaint  Patient presents with   Back Pain   Abdominal Pain    Catherine FantasiaJanine L Koch is a 45 y.o. female with a history of GERD, hypercholesterolemia, hypertension, sleep apnea, chronic gastritis, depression, and prior cholecystectomy who presents to the emergency department with complaints of back pain that began yesterday.  Patient states she initially thought her back hurt due to doing some cleaning/housekeeping, however pain seemed to persist more so than she would expect.  Pain is to her entire back, more so to the lower back, she is also having some left-sided abdominal pain, nausea, had 1 episode of emesis, a few episodes of diarrhea,  and had a fever to 102.6 at home.  Fever seemed to break this morning, still had some chills with continued discomfort, no further episodes of emesis but has only tried drinking sips of water.  She denies hematemesis, melena, hematochezia, dysuria, chest pain, dyspnea, cough, or syncope.  Patient denies numbness, tingling, weakness, retention, or history of IV drug use.  HPI     Past Medical History:  Diagnosis Date   Anxiety    Arthritis    pt. states in knees   Chronic gastritis    Chronic sinus infection    Closed fracture of right distal fibula 11/19/2013   Complication of anesthesia    pt. states difficult to wake up   Depression    GERD (gastroesophageal reflux disease)    Headache    pt. states random migraines   Hemorrhoids    High cholesterol    History of bronchitis    History of degenerative disc disease    Hypertension    Ovarian failure    PTSD (post-traumatic stress disorder)    Shortness of breath    exertion from crutches   Sleep apnea    cpap   UTI (lower urinary tract infection)    Wears glasses     Patient Active Problem List   Diagnosis Date Noted   Generalized anxiety disorder  08/26/2020   Major depressive disorder, recurrent episode, moderate (HCC) 08/26/2020   Mild episode of recurrent major depressive disorder (HCC) 02/26/2020   LLQ abdominal pain    Rectal bleeding    Gastritis and gastroduodenitis    Nausea without vomiting    LUQ abdominal pain    Deviated nasal septum 12/11/2014    Class: Chronic   Closed fracture of right distal fibula 11/19/2013   S/P ORIF (open reduction internal fixation) fracture 11/19/2013   Obesity, morbid (HCC) 06/17/2013   Chronic rhinitis 06/17/2013   Acute maxillary sinusitis 06/03/2012   Obstructive sleep apnea 01/15/2012   Depression 01/15/2012    Past Surgical History:  Procedure Laterality Date   BIOPSY  12/10/2019   Procedure: BIOPSY;  Surgeon: Napoleon FormNandigam, Kavitha V, MD;  Location: [facility];  Service: Endoscopy;;   CHOLECYSTECTOMY  2010   CHOLECYSTECTOMY     COLONOSCOPY WITH PROPOFOL N/A 12/10/2019   Procedure: COLONOSCOPY WITH PROPOFOL;  Surgeon: Napoleon FormNandigam, Kavitha V, MD;  Location: [facility];  Service: Endoscopy;  Laterality: N/A;   ESOPHAGOGASTRODUODENOSCOPY     ESOPHAGOGASTRODUODENOSCOPY (EGD) WITH PROPOFOL N/A 12/10/2019   Procedure: ESOPHAGOGASTRODUODENOSCOPY (EGD) WITH PROPOFOL;  Surgeon: Napoleon FormNandigam, Kavitha V, MD;  Location: [facility];  Service: Endoscopy;  Laterality: N/A;   NASAL SEPTOPLASTY W/ TURBINOPLASTY Bilateral 12/11/2014   Procedure: NASAL SEPTOPLASTY AND BILATERAL INFERIOR TURBINATE REDUCTION;  Surgeon: Osborn Coho, MD;  Location: [facility] OR;  Service: ENT;  Laterality: Bilateral;   ORIF FIBULA FRACTURE Right 11/19/2013   Procedure: OPEN REDUCTION INTERNAL FIXATION (ORIF) RIGHT FIBULA FRACTURE;  Surgeon: Eulas Post, MD;  Location: [facility] OR;  Service: Orthopedics;  Laterality: Right;   WISDOM TOOTH EXTRACTION       OB History     Gravida  0   Para  0   Term  0   Preterm  0   AB  0   Living  0      SAB  0   IAB  0   Ectopic  0   Multiple  0   Live Births  0            Family History  Problem Relation Age of Onset   Allergies Sister    Heart disease Paternal Grandmother    Cancer Paternal Grandmother        BREAST CANCER   Cancer Paternal Grandfather     Social History   Tobacco Use   Smoking status: Former    Pack years: 0.00    Types: Cigarettes    Quit date: 05/24/2010    Years since quitting: 10.4   Smokeless tobacco: Never   Tobacco comments:    Pt. states she used to be a social smoker  Vaping Use   Vaping Use: Never used  Substance Use Topics   Alcohol use: Not Currently   Drug use: No    Home Medications Prior to Admission medications   Medication Sig Start Date End Date Taking? Authorizing Provider  DULoxetine (CYMBALTA) 60 MG capsule TAKE 1 CAPSULE BY MOUTH DAILY 10/28/20   Toy Cookey E, NP  DULoxetine 40 MG CPEP Take 80 mg by mouth daily. 08/26/20   Shanna Cisco, NP  fluticasone (FLONASE) 50 MCG/ACT nasal spray Place 1 spray into both nostrils daily. Patient taking differently: Place 1 spray into both nostrils daily as needed for allergies.  04/22/17   Caccavale, Sophia, PA-C  hydrOXYzine (ATARAX/VISTARIL) 25 MG tablet Take 1 tablet (25 mg total) by mouth at bedtime. 08/26/20   Shanna Cisco, NP  omeprazole (PRILOSEC) 40 MG capsule Take 1 capsule (40 mg total) by mouth daily. 12/10/19   Napoleon Form, MD  traZODone (DESYREL) 100 MG tablet Take 1 tablet (100 mg total) by mouth at bedtime as needed for sleep. 08/26/20   Shanna Cisco, NP  Turmeric (QC TUMERIC COMPLEX PO) Take 1 tablet by mouth daily.    [provider]  ranitidine (ZANTAC) 150 MG tablet Take 1 tablet (150 mg total) by mouth 2 (two) times daily. 10/25/17 08/04/19  Muthersbaugh, Dahlia Client, PA-C    Allergies    Sulfa antibiotics  Review of Systems   Review of Systems  Constitutional:  Positive for chills and fever.  Respiratory:  Negative for cough and shortness of breath.   Cardiovascular:  Negative for chest pain.   Gastrointestinal:  Positive for abdominal pain, diarrhea, nausea and vomiting. Negative for blood in stool.  Genitourinary:  Negative for dysuria, vaginal bleeding and vaginal discharge.  Neurological:  Negative for syncope, weakness and numbness.       Negative for retention or saddle anesthesia.  All other systems reviewed and are negative.  Physical Exam Updated Vital Signs BP (!) 141/93   Pulse 90   Temp 98.8 F (37.1 C) (Oral)   Resp 18   Ht 5\' 8"  (1.727 m)   Wt (!) 167.8  kg   LMP 06/29/2013 Comment: waiver signed  SpO2 97%   BMI 56.26 kg/m   Physical Exam Vitals and nursing note reviewed.  Constitutional:      General: She is not in acute distress.    Appearance: She is well-developed. She is not toxic-appearing.  HENT:     Head: Normocephalic and atraumatic.  Eyes:     General:        Right eye: No discharge.        Left eye: No discharge.     Conjunctiva/sclera: Conjunctivae normal.  Cardiovascular:     Rate and Rhythm: Normal rate and regular rhythm.  Pulmonary:     Effort: Pulmonary effort is normal. No respiratory distress.     Breath sounds: Normal breath sounds. No wheezing, rhonchi or rales.  Abdominal:     General: There is no distension.     Palpations: Abdomen is soft.     Tenderness: There is abdominal tenderness (Generalized most notable in the left abdomen.). There is right CVA tenderness and left CVA tenderness. There is no guarding or rebound.  Musculoskeletal:     Cervical back: Neck supple.     Comments: No point/focal vertebral tenderness.  Diffuse paraspinal muscle tenderness most notable in the left thoracic and lumbar region.  Skin:    General: Skin is warm and dry.     Findings: No rash.  Neurological:     Mental Status: She is alert.     Comments: Clear speech.  Sensation grossly intact bilateral lower extremities.  5-5 strength with plantar and dorsiflexion bilaterally.  Psychiatric:        Behavior: Behavior normal.    ED Results  / Procedures / Treatments   Labs (all labs ordered are listed, but only abnormal results are displayed) Labs Reviewed  URINALYSIS, ROUTINE W REFLEX MICROSCOPIC - Abnormal; Notable for the following components:      Result Value   APPearance HAZY (*)    Leukocytes,Ua MODERATE (*)    All other components within normal limits  RESP PANEL BY RT-PCR (FLU A&B, COVID) ARPGX2  PREGNANCY, URINE    EKG None  Radiology CT Abdomen Pelvis W Contrast  Result Date: 11/11/2020 CLINICAL DATA:  Left lower quadrant pain and fevers EXAM: CT ABDOMEN AND PELVIS WITH CONTRAST TECHNIQUE: Multidetector CT imaging of the abdomen and pelvis was performed using the standard protocol following bolus administration of intravenous contrast. CONTRAST:  OMNIPAQUE IOHEXOL 300 MG/ML  SOLN COMPARISON:  05/13/2020, 08/06/2019 FINDINGS: Lower chest: No acute abnormality. Hepatobiliary: Fatty infiltration of the liver is noted. The gallbladder has been surgically removed. Pancreas: Unremarkable. No pancreatic ductal dilatation or surrounding inflammatory changes. Spleen: Normal in size without focal abnormality. Adrenals/Urinary Tract: Adrenal glands are within normal limits. Kidneys demonstrate a normal enhancement pattern bilaterally. Normal excretion is noted on delayed images. No obstructive changes are seen. The bladder is partially distended. Stomach/Bowel: Colon is predominately decompressed. No inflammatory or obstructive changes are noted. The appendix is within normal limits. Small bowel and stomach are unremarkable. Vascular/Lymphatic: No significant vascular findings are present. No enlarged abdominal or pelvic lymph nodes. Reproductive: Uterus and bilateral adnexa are unremarkable. Other: No abdominal wall hernia or abnormality. No abdominopelvic ascites. Musculoskeletal: No acute or significant osseous findings. IMPRESSION: Normal-appearing appendix. Normal colon. Fatty infiltration of the liver. Electronically  Signed   By: Alcide Clever M.D.   On: 11/11/2020 22:34    Procedures Procedures   Medications Ordered in ED Medications - No data to  display  ED Course  I have reviewed the triage vital signs and the nursing notes.  Pertinent labs & imaging results that were available during my care of the patient were reviewed by me and considered in my medical decision making (see chart for details).    MDM Rules/Calculators/A&P                          Patient presents to the ED with complaints of back pain, abdominal pain, and several other symptoms as above.  She is nontoxic, resting comfortably, blood pressure elevated, vitals otherwise fairly unremarkable.  On exam patient does have some paraspinal muscle tenderness to the back as well as some generalized abdominal tenderness that is worse on the left side, no peritoneal signs.  No neurodeficits.  Additional history obtained:  Additional history obtained from chart review & nursing note review.   Lab Tests:  I Ordered, reviewed, and interpreted labs, which included:  CBC: Unremarkable CMP: Elevated LFTs, total bilirubin within normal limits.  Renal function preserved. Lipase: Within normal limits Urinalysis moderate leukocytes, WBCs present.  COVID/influenza testing: Negative  Morphine ordered for pain, Zofran ordered for nausea, and normal saline ordered for hydration.  Imaging Studies ordered:  I ordered imaging studies which included CT abdomen/pelvis with contrast, I independently reviewed, formal radiology impression shows:  Normal-appearing appendix. Normal colon. Fatty infiltration of the liver.   ED Course:  Overall reassuring work-up in the emergency department.  Mildly elevated LFTs and changes of fatty liver on CT imaging-will need PCP follow-up.  No other acute process on CT imaging, repeat abdominal exam remains without peritoneal signs, I have a low suspicion for acute surgical process.  Patient with back pain, bilateral CVA  tenderness, she does have moderate leukocytes in her urine, she states this does feel similar to when she has had kidney infections, febrile yesterday with vomiting as well, urine sent for culture, will start Rocephin in the ED and start on abx. patient is tolerating p.o. and feeling improved, overall appears appropriate for discharge home.  Will start on outpatient antibiotics with further symptomatic care. I discussed results, treatment plan, need for follow-up, and return precautions with the patient. Provided opportunity for questions, patient confirmed understanding and is in agreement with plan.   Portions of this note were generated with Scientist, clinical (histocompatibility and immunogenetics). Dictation errors may occur despite best attempts at proofreading.  Final Clinical Impression(s) / ED Diagnoses Final diagnoses:  Acute bilateral back pain, unspecified back location  Abdominal pain, unspecified abdominal location  Elevated LFTs    Rx / DC Orders ED Discharge Orders          Ordered    cefpodoxime (VANTIN) 200 MG tablet  2 times daily        11/11/20 2309    ondansetron (ZOFRAN ODT) 4 MG disintegrating tablet  Every 8 hours PRN        11/11/20 2309    naproxen (NAPROSYN) 500 MG tablet  2 times daily PRN        11/11/20 2309             Braylen Denunzio, Pleas Koch, PA-C 11/11/20 2356    Rolan Bucco, MD 11/12/20 1035

## 2020-11-12 NOTE — Progress Notes (Signed)
   THERAPIST PROGRESS NOTE   Virtual Visit via Video Note  I connected with Catherine Koch on 11/06/20 at  4:00 PM EDT by a video enabled telemedicine application and verified that I am speaking with the correct person using two identifiers.  Location: Patient: Home Provider: [facility]   I discussed the limitations of evaluation and management by telemedicine and the availability of in person appointments. The patient expressed understanding and agreed to proceed. I discussed the assessment and treatment plan with the patient. The patient was provided an opportunity to ask questions and all were answered. The patient agreed with the plan and demonstrated an understanding of the instructions.   I provided 40 minutes of non-face-to-face time during this encounter.  Participation Level: Active  Behavioral Response: CasualLethargicDepressed  Type of Therapy: Individual Therapy  Treatment Goals addressed: Communication: dep/anx/coping  Interventions: Solution Focused, Supportive, and Reframing  Summary: Catherine Koch is a 45 y.o. female who presents with hx of dep/anx. Pt signs on for video session. She states her still new roommates are in the home but she is in office with door closed. Pt states all is going well with new roommates, no concerns noted. Pt advises spouse is out of work for the summer. She states they continue to have "a mess" in their home and she hopes they can get it better organized. She advises [employer] estate should be closed out as of today. Pt reports finances continue to be a strain and she has again asked her parents for financial aid. Mother still pushing pt to get a job. Pt states she is working on disability and does not feel she is able to work. She is continuing online gaming. She reports she is trying to get more of a structured schedule and is sleeping better. Pt using a sleep tracking app. When LCSW assessed for meds pt admits she went off MH meds again for ~ a wk  d/t funds to pick meds up. LCSW recommended pt get pill box and also make meds the priority they should be for her optimal wellness. Pt states she could tell a difference in her mood and coping while off meds. Pt reports she is having increased anx with the site of blood or anything that looks like blood since the suicide attempt of past friend in their home/bathroom. Reviewed coping strategies. LCSW assessed for reading of grief lit. Pt states she read lit but does not recall much as this was when she was off meds. Pt states intent to read again and share with spouse. LCSW reviewed poc including scheduling. Pt states appreciation for care.    Suicidal/Homicidal: Nowithout intent/plan  Therapist Response: Pt remains somewhat receptive to care.  Plan: Return again in 4 weeks.  Diagnosis: Axis I:  depression with anxiety  [place], LCSW 11/12/2020

## 2020-11-13 LAB — URINE CULTURE: Culture: >=100000 — AB

## 2020-11-20 ENCOUNTER — Telehealth (INDEPENDENT_AMBULATORY_CARE_PROVIDER_SITE_OTHER): Payer: No Payment, Other | Admitting: Psychiatry

## 2020-11-20 ENCOUNTER — Encounter (HOSPITAL_COMMUNITY): Payer: Self-pay | Admitting: Psychiatry

## 2020-11-20 ENCOUNTER — Other Ambulatory Visit: Payer: Self-pay

## 2020-11-20 DIAGNOSIS — F330 Major depressive disorder, recurrent, mild: Secondary | ICD-10-CM

## 2020-11-20 DIAGNOSIS — F411 Generalized anxiety disorder: Secondary | ICD-10-CM

## 2020-11-20 MED ORDER — HYDROXYZINE HCL 25 MG PO TABS: 25.0000 mg | ORAL_TABLET | Freq: Every day | ORAL | 2 refills | Status: DC

## 2020-11-20 MED ORDER — TRAZODONE HCL 100 MG PO TABS: 100.0000 mg | ORAL_TABLET | Freq: Every evening | ORAL | 2 refills | Status: DC | PRN

## 2020-11-20 MED ORDER — DULOXETINE HCL 40 MG PO CPEP: 80.0000 mg | ORAL_CAPSULE | Freq: Every day | ORAL | 2 refills | Status: DC

## 2020-11-20 NOTE — Progress Notes (Signed)
BH MD/PA/NP OP Progress Note Virtual Visit via Video Note  I connected with Catherine FantasiaJanine L Gerads on 11/20/20 at  2:30 PM EDT by a video enabled telemedicine application and verified that I am speaking with the correct person using two identifiers.  Location: Patient: Home Provider: Clinic   I discussed the limitations of evaluation and management by telemedicine and the availability of in person appointments. The patient expressed understanding and agreed to proceed.  I provided 30 minutes of non-face-to-face time during this encounter.    11/20/2020 3:04 PM Catherine Koch  MRN:  [id number]  Chief Complaint: "I'm getting over a bad UTI and kidney infection"   HPI: 45 year old female seen today  for follow-up psychiatric evaluation. She has a history of depression, PTSD,  and anxiety, and is currently managed on Hydroxyzine 25mg  at bedtime, Cymbalta 80 mg daily, and Trazodone 100 mg at bedtime.    Today she was pleasant, cooperative, engaged in conversation, and maintained eye contact. She informed she is recently getting over a UTI and a kidney infection. She notes that she is sore and attempting to rest. Patient notes that her mental health is stable. She reports her anxiety and depression are well managed. She notes that occasional she is stressed due to marital issues but notes over all she is coping well.  Today  provider conducted a GAD 7 and patient scored a 7, at her last visit she scored a 15. Provider also conducted a PHQ 9 and patient scored a 8, at her last visit she scored a 17. Today denies SI/HI/VAH or paranoia. She notes that her sleep has improved since her last  visit noting she sleeps 6-8 hours most days. She notes that since being sick her appetite was poor and notes that she lost 8 pounds.  No medication changes made today. Patient agreeable to continue medications as prescribed. She will follow up with outpatient counseling for therapy.  No other concerns noted at this time.    Visit Diagnosis:    ICD-10-CM   1. Generalized anxiety disorder  F41.1 hydrOXYzine (ATARAX/VISTARIL) 25 MG tablet    2. Mild episode of recurrent major depressive disorder (HCC)  F33.0 DULoxetine 40 MG CPEP    traZODone (DESYREL) 100 MG tablet      Past Psychiatric History:  PTSD, anxiety, and depression  Past Medical History:  Past Medical History:  Diagnosis Date   Anxiety    Arthritis    pt. states in knees   Chronic gastritis    Chronic sinus infection    Closed fracture of right distal fibula 11/19/2013   Complication of anesthesia    pt. states difficult to wake up   Depression    GERD (gastroesophageal reflux disease)    Headache    pt. states random migraines   Hemorrhoids    High cholesterol    History of bronchitis    History of degenerative disc disease    Hypertension    Ovarian failure    PTSD (post-traumatic stress disorder)    Shortness of breath    exertion from crutches   Sleep apnea    cpap   UTI (lower urinary tract infection)    Wears glasses     Past Surgical History:  Procedure Laterality Date   BIOPSY  12/10/2019   Procedure: BIOPSY;  Surgeon: Napoleon FormNandigam, Kavitha V, MD;  Location: [facility];  Service: Endoscopy;;   CHOLECYSTECTOMY  2010   CHOLECYSTECTOMY     COLONOSCOPY WITH PROPOFOL N/A  12/10/2019   Procedure: COLONOSCOPY WITH PROPOFOL;  Surgeon: Napoleon Form, MD;  Location: [facility];  Service: Endoscopy;  Laterality: N/A;   ESOPHAGOGASTRODUODENOSCOPY     ESOPHAGOGASTRODUODENOSCOPY (EGD) WITH PROPOFOL N/A 12/10/2019   Procedure: ESOPHAGOGASTRODUODENOSCOPY (EGD) WITH PROPOFOL;  Surgeon: Napoleon Form, MD;  Location: [facility];  Service: Endoscopy;  Laterality: N/A;   NASAL SEPTOPLASTY W/ TURBINOPLASTY Bilateral 12/11/2014   Procedure: NASAL SEPTOPLASTY AND BILATERAL INFERIOR TURBINATE REDUCTION;  Surgeon: Osborn Coho, MD;  Location: [facility] OR;  Service: ENT;  Laterality: Bilateral;   ORIF FIBULA FRACTURE Right 11/19/2013    Procedure: OPEN REDUCTION INTERNAL FIXATION (ORIF) RIGHT FIBULA FRACTURE;  Surgeon: Eulas Post, MD;  Location: [facility] OR;  Service: Orthopedics;  Laterality: Right;   WISDOM TOOTH EXTRACTION      Family Psychiatric History: Sister anxiety and depression. Notes mother has untreated mental health conditions  Family History:  Family History  Problem Relation Age of Onset   Allergies Sister    Heart disease Paternal Grandmother    Cancer Paternal Grandmother        BREAST CANCER   Cancer Paternal Grandfather     Social History:  Social History   Socioeconomic History   Marital status: Married    Spouse name: Not on file   Number of children: Not on file   Years of education: Not on file   Highest education level: Not on file  Occupational History    Comment: UNEMPOLYEED   Tobacco Use   Smoking status: Former    Pack years: 0.00    Types: Cigarettes    Quit date: 05/24/2010    Years since quitting: 10.5   Smokeless tobacco: Never   Tobacco comments:    Pt. states she used to be a social smoker  Vaping Use   Vaping Use: Never used  Substance and Sexual Activity   Alcohol use: Not Currently   Drug use: No   Sexual activity: Yes    Birth control/protection: None  Other Topics Concern   Not on file  Social History Narrative   Not on file   Social Determinants of Health   Financial Resource Strain: Not on file  Food Insecurity: Not on file  Transportation Needs: Not on file  Physical Activity: Not on file  Stress: Not on file  Social Connections: Not on file    Allergies:  Allergies  Allergen Reactions   Sulfa Antibiotics Anaphylaxis, Swelling and Rash    Throat swelling/ rashes    Metabolic Disorder Labs: No results found for: HGBA1C, MPG No results found for: PROLACTIN No results found for: CHOL, TRIG, HDL, CHOLHDL, VLDL, LDLCALC Lab Results  Component Value Date   TSH 1.90 04/11/2012    Therapeutic Level Labs: No results found for: LITHIUM No  results found for: VALPROATE No components found for:  CBMZ  Current Medications: Current Outpatient Medications  Medication Sig Dispense Refill   acetaminophen (TYLENOL) 500 MG tablet Take 1,000 mg by mouth every 6 (six) hours as needed for fever.     cefpodoxime (VANTIN) 200 MG tablet Take 1 tablet (200 mg total) by mouth 2 (two) times daily. 20 tablet 0   diclofenac (VOLTAREN) 75 MG EC tablet Take 75 mg by mouth 2 (two) times daily.     DULoxetine 40 MG CPEP Take 80 mg by mouth daily. 60 capsule 2   fluticasone (FLONASE) 50 MCG/ACT nasal spray Place 1 spray into both nostrils daily. (Patient not taking: Reported on 11/11/2020) 16 g 0  hydrOXYzine (ATARAX/VISTARIL) 25 MG tablet Take 1 tablet (25 mg total) by mouth at bedtime. 30 tablet 2   levocetirizine (XYZAL) 5 MG tablet Take 5 mg by mouth daily at 6 (six) AM.     metFORMIN (GLUMETZA) 500 MG (MOD) 24 hr tablet Take 500 mg by mouth daily with breakfast.     metoprolol tartrate (LOPRESSOR) 25 MG tablet Take 25 mg by mouth 2 (two) times daily.     misoprostol (CYTOTEC) 200 MCG tablet Take 200 mcg by mouth 2 (two) times daily as needed (stomach ulcers).     naproxen (NAPROSYN) 500 MG tablet Take 1 tablet (500 mg total) by mouth 2 (two) times daily as needed for moderate pain. 15 tablet 0   omeprazole (PRILOSEC) 40 MG capsule Take 1 capsule (40 mg total) by mouth daily. 90 capsule 3   ondansetron (ZOFRAN ODT) 4 MG disintegrating tablet Take 1 tablet (4 mg total) by mouth every 8 (eight) hours as needed for nausea or vomiting. 8 tablet 0   spironolactone (ALDACTONE) 25 MG tablet Take 25 mg by mouth daily.     traZODone (DESYREL) 100 MG tablet Take 1 tablet (100 mg total) by mouth at bedtime as needed for sleep. 30 tablet 2   Turmeric (QC TUMERIC COMPLEX PO) Take 1 tablet by mouth daily.     No current facility-administered medications for this visit.     Musculoskeletal: Strength & Muscle Tone:  Unable to assess due to telehealth  visit Gait & Station:  Unable to assess due to telehealth visit Patient leans: N/A  Psychiatric Specialty Exam: Review of Systems  Last menstrual period 06/29/2013.There is no height or weight on file to calculate BMI.  General Appearance: Well Groomed  Eye Contact:  Good  Speech:  Clear and Coherent and Normal Rate  Volume:  Normal  Mood:  Euthymic  Affect:  Congruent  Thought Process:  Coherent, Goal Directed and Linear  Orientation:  Full (Time, Place, and Person)  Thought Content: WDL and Logical   Suicidal Thoughts:  No  Homicidal Thoughts:  No  Memory:  Immediate;   Good Recent;   Good Remote;   Good  Judgement:  Good  Insight:  Good  Psychomotor Activity:  Normal  Concentration:  Concentration: Good and Attention Span: Good  Recall:  Good  Fund of Knowledge: Good  Language: Good  Akathisia:  No  Handed:  Right  AIMS (if indicated): Not done  Assets:  Communication Skills Desire for Improvement Financial Resources/Insurance Housing Intimacy Social Support  ADL's:  Intact  Cognition: WNL  Sleep:  Good   Screenings: GAD-7    Flowsheet Row Video Visit from 11/20/2020 in [facility] Video Visit from 08/26/2020 in [facility] Video Visit from 05/28/2020 in [facility]  Total GAD-7 Score 7 15 9       PHQ2-9    Flowsheet Row Video Visit from 11/20/2020 in [facility] Video Visit from 08/26/2020 in [facility] Video Visit from 05/28/2020 in [facility] Nutrition from 07/18/2013 [facility]  PHQ-2 Total Score 2 4 2 2   PHQ-9 Total Score 8 17 13 7       Flowsheet Row Video Visit from 11/20/2020 in [facility] ED from 11/11/2020 in [facility] Video Visit from 08/26/2020 in [name]   C-SSRS RISK CATEGORY No Risk No  Risk No Risk        Assessment and Plan: Patient notes that her anxiety, depression, and sleep have improved since her last visit. No medication changes made today. Patient agreeable to continue medications as prescribed.  1. Generalized anxiety disorder  Continue- DULoxetine 40 MG CPEP; Take 80 mg by mouth daily.  Dispense: 60 capsule; Refill: 2 Continue- hydrOXYzine (ATARAX/VISTARIL) 25 MG tablet; Take 1 tablet (25 mg total) by mouth at bedtime.  Dispense: 30 tablet; Refill: 2  2. Major depressive disorder, recurrent episode, moderate (HCC)  Continue- DULoxetine 40 MG CPEP; Take 80 mg by mouth daily.  Dispense: 60 capsule; Refill: 2 Continue- traZODone (DESYREL) 100 MG tablet; Take 1 tablet (100 mg total) by mouth at bedtime as needed for sleep.  Dispense: 30 tablet; Refill: 2    Follow-up in 3 months Follow-up with therapy   Shanna Cisco, NP 11/20/2020, 3:04 PM

## 2020-12-04 ENCOUNTER — Ambulatory Visit (INDEPENDENT_AMBULATORY_CARE_PROVIDER_SITE_OTHER): Payer: No Payment, Other | Admitting: Licensed Clinical Social Worker

## 2020-12-04 ENCOUNTER — Other Ambulatory Visit: Payer: Self-pay

## 2020-12-04 DIAGNOSIS — F418 Other specified anxiety disorders: Secondary | ICD-10-CM | POA: Diagnosis not present

## 2020-12-08 NOTE — Progress Notes (Signed)
   THERAPIST PROGRESS NOTE   Virtual Visit via Video Note  I connected with Catherine Koch on 12/02/20 at  2:00 PM EDT by a video enabled telemedicine application and verified that I am speaking with the correct person using two identifiers.  Location: Patient: Home Provider: [facility]   I discussed the limitations of evaluation and management by telemedicine and the availability of in person appointments. The patient expressed understanding and agreed to proceed. I discussed the assessment and treatment plan with the patient. The patient was provided an opportunity to ask questions and all were answered. The patient agreed with the plan and demonstrated an understanding of the instructions.  I provided 30 minutes of non-face-to-face time during this encounter.  Participation Level: Active  Behavioral Response: CasualDrowsyAnxious and Depressed  Type of Therapy: Individual Therapy  Treatment Goals addressed: Communication: dep/anx/coping  Interventions: Solution Focused, Supportive, and Other: Reality therapy  Summary: Catherine Koch is a 45 y.o. female who presents with hx dep/anx. This date pt returns for video session. She is again noted to be drowsy during session as evidenced by yawning repeatedly. Pt reports she is feeling dep and anx with poor concentration. She states there has been another death, spouse's grandfather died. Pt reports funeral is this weekend. She states they are still trying to cope with the loss of mil. Pt reports their finances remain very strained. She is not taking meds as prescribed d/t finances. Today she states mil estate still not settled but said it was last session. She reports there was additional paperwork needed they were not aware of. Pt reports her spouse is more irritable and has been exploding at her, which is distressing. She provides some info on a "spending problem" with spouse and problems with him failing to keep up with the bill paying. Pt  states she is gaming as much as she can to try to bring in some income. LCSW assisted pt to process thoughts/concerns/choices. She has not read grief lit and was encouraged once again to do so. Also made referral to grief counseling with spouse at hospice. Referral to [email or web address]  LCSW provided education and reality therapy r/t choices/consequences/need to prioritize meds. LCSW reviewed poc including scheduling prior to close of session. Pt states appreciation for care.   Suicidal/Homicidal: Nowithout intent/plan  Therapist Response: Pt somewhat receptive to care.  Plan: Return again for next avail appt.  Diagnosis: Axis I:  depression with anxiety     Socorro Sink, LCSW 12/08/2020

## 2020-12-30 ENCOUNTER — Telehealth: Payer: Self-pay | Admitting: Internal Medicine

## 2020-12-30 NOTE — Telephone Encounter (Signed)
Rec'd faxed request for additional information from [employer] disability services.  I wrote note on cover page stating we have not seen patient since 02/26/2020 and she will need an appt.  Also, we have not previously rec'd a request for completion of disability forms.  Faxed back to [phone]

## 2021-01-09 ENCOUNTER — Ambulatory Visit (INDEPENDENT_AMBULATORY_CARE_PROVIDER_SITE_OTHER): Payer: Medicaid Other | Admitting: Certified Nurse Midwife

## 2021-01-09 ENCOUNTER — Encounter: Payer: Self-pay | Admitting: Certified Nurse Midwife

## 2021-01-09 ENCOUNTER — Other Ambulatory Visit: Payer: Self-pay

## 2021-01-09 VITALS — BP 122/78 | HR 76 | Temp 98.8°F | Ht 68.0 in | Wt 363.0 lb

## 2021-01-09 DIAGNOSIS — Z01419 Encounter for gynecological examination (general) (routine) without abnormal findings: Secondary | ICD-10-CM | POA: Diagnosis not present

## 2021-01-09 NOTE — Progress Notes (Signed)
GYNECOLOGY CLINIC ANNUAL PREVENTATIVE CARE ENCOUNTER NOTE  Subjective:   Catherine Koch is a 45 y.o. G0P0000 female here for a routine annual gynecologic exam.  Current complaints: No physical gynecological complaints. Recently had hormone testing and was found to be "both premenopausal and menopausal but with high estrogen". Has primary ovarian failure and used to take meds to stimulate a period but does not anymore. Sexually active only with her husband and not very often, declines STI testing. Denies abnormal vaginal bleeding, discharge, pelvic pain, problems with intercourse or other gynecologic concerns.    Gynecologic History Patient's last menstrual period was 06/29/2013. Contraception: none Last Pap: 2021. Results were: normal Last mammogram: Unknown. One ordered today.  Obstetric History OB History  Gravida Para Term Preterm AB Living  0 0 0 0 0 0  SAB IAB Ectopic Multiple Live Births  0 0 0 0 0   Past Medical History:  Diagnosis Date   Anxiety    Arthritis    pt. states in knees   Chronic gastritis    Chronic sinus infection    Closed fracture of right distal fibula 11/19/2013   Complication of anesthesia    pt. states difficult to wake up   Depression    GERD (gastroesophageal reflux disease)    Headache    pt. states random migraines   Hemorrhoids    High cholesterol    History of bronchitis    History of degenerative disc disease    Hypertension    Ovarian failure    PTSD (post-traumatic stress disorder)    Shortness of breath    exertion from crutches   Sleep apnea    cpap   UTI (lower urinary tract infection)    Wears glasses    Past Surgical History:  Procedure Laterality Date   BIOPSY  12/10/2019   Procedure: BIOPSY;  Surgeon: Napoleon Form, MD;  Location: [facility];  Service: Endoscopy;;   CHOLECYSTECTOMY  2010   CHOLECYSTECTOMY     COLONOSCOPY WITH PROPOFOL N/A 12/10/2019   Procedure: COLONOSCOPY WITH PROPOFOL;  Surgeon: Napoleon Form, MD;  Location: [facility];  Service: Endoscopy;  Laterality: N/A;   ESOPHAGOGASTRODUODENOSCOPY     ESOPHAGOGASTRODUODENOSCOPY (EGD) WITH PROPOFOL N/A 12/10/2019   Procedure: ESOPHAGOGASTRODUODENOSCOPY (EGD) WITH PROPOFOL;  Surgeon: Napoleon Form, MD;  Location: [facility];  Service: Endoscopy;  Laterality: N/A;   NASAL SEPTOPLASTY W/ TURBINOPLASTY Bilateral 12/11/2014   Procedure: NASAL SEPTOPLASTY AND BILATERAL INFERIOR TURBINATE REDUCTION;  Surgeon: Osborn Coho, MD;  Location: [facility] OR;  Service: ENT;  Laterality: Bilateral;   ORIF FIBULA FRACTURE Right 11/19/2013   Procedure: OPEN REDUCTION INTERNAL FIXATION (ORIF) RIGHT FIBULA FRACTURE;  Surgeon: Eulas Post, MD;  Location: [facility] OR;  Service: Orthopedics;  Laterality: Right;   WISDOM TOOTH EXTRACTION     Current Outpatient Medications on File Prior to Visit  Medication Sig Dispense Refill   acetaminophen (TYLENOL) 500 MG tablet Take 1,000 mg by mouth every 6 (six) hours as needed for fever.     diclofenac (VOLTAREN) 75 MG EC tablet Take 75 mg by mouth 2 (two) times daily.     DULoxetine 40 MG CPEP Take 80 mg by mouth daily. 60 capsule 2   hydrOXYzine (ATARAX/VISTARIL) 25 MG tablet Take 1 tablet (25 mg total) by mouth at bedtime. 30 tablet 2   metFORMIN (GLUMETZA) 500 MG (MOD) 24 hr tablet Take 500 mg by mouth daily with breakfast.     metoprolol tartrate (LOPRESSOR) 25  MG tablet Take 25 mg by mouth 2 (two) times daily.     misoprostol (CYTOTEC) 200 MCG tablet Take 200 mcg by mouth 2 (two) times daily as needed (stomach ulcers).     omeprazole (PRILOSEC) 40 MG capsule Take 1 capsule (40 mg total) by mouth daily. 90 capsule 3   ondansetron (ZOFRAN ODT) 4 MG disintegrating tablet Take 1 tablet (4 mg total) by mouth every 8 (eight) hours as needed for nausea or vomiting. 8 tablet 0   spironolactone (ALDACTONE) 25 MG tablet Take 25 mg by mouth daily.     traZODone (DESYREL) 100 MG tablet Take 1 tablet (100 mg total) by mouth  at bedtime as needed for sleep. 30 tablet 2   Turmeric (QC TUMERIC COMPLEX PO) Take 1 tablet by mouth daily.     cefpodoxime (VANTIN) 200 MG tablet Take 1 tablet (200 mg total) by mouth 2 (two) times daily. 20 tablet 0   fluticasone (FLONASE) 50 MCG/ACT nasal spray Place 1 spray into both nostrils daily. (Patient not taking: No sig reported) 16 g 0   levocetirizine (XYZAL) 5 MG tablet Take 5 mg by mouth daily at 6 (six) AM.     naproxen (NAPROSYN) 500 MG tablet Take 1 tablet (500 mg total) by mouth 2 (two) times daily as needed for moderate pain. 15 tablet 0   [DISCONTINUED] ranitidine (ZANTAC) 150 MG tablet Take 1 tablet (150 mg total) by mouth 2 (two) times daily. 60 tablet 0   No current facility-administered medications on file prior to visit.   Allergies  Allergen Reactions   Sulfa Antibiotics Anaphylaxis, Swelling and Rash    Throat swelling/ rashes   Social History   Socioeconomic History   Marital status: Married    Spouse name: Not on file   Number of children: Not on file   Years of education: Not on file   Highest education level: Not on file  Occupational History    Comment: UNEMPOLYEED   Tobacco Use   Smoking status: Former    Types: Cigarettes    Quit date: 05/24/2010    Years since quitting: 10.6    Passive exposure: Never   Smokeless tobacco: Never   Tobacco comments:    Pt. states she used to be a social smoker  Vaping Use   Vaping Use: Never used  Substance and Sexual Activity   Alcohol use: Not Currently   Drug use: No   Sexual activity: Yes    Birth control/protection: None  Other Topics Concern   Not on file  Social History Narrative   Not on file   Social Determinants of Health   Financial Resource Strain: Not on file  Food Insecurity: Not on file  Transportation Needs: Not on file  Physical Activity: Not on file  Stress: Not on file  Social Connections: Not on file  Intimate Partner Violence: Not on file   Family History  Problem Relation  Age of Onset   Allergies Sister    Heart disease Paternal Grandmother    Cancer Paternal Grandmother        BREAST CANCER   Cancer Paternal Grandfather    The following portions of the patient's history were reviewed and updated as appropriate: allergies, current medications, past family history, past medical history, past social history, past surgical history and problem list.  Review of Systems Pertinent items noted in HPI and remainder of comprehensive ROS otherwise negative.   Objective:  BP 122/78 (BP Location: Left Arm, Patient Position:  Sitting, Cuff Size: Normal)   Pulse 76   Temp 98.8 F (37.1 C) (Oral)   Ht 5\' 8"  (1.727 m)   Wt (!) 363 lb (164.7 kg)   LMP 06/29/2013 Comment: waiver signed  BMI 55.19 kg/m  Constitutional: Well-developed, obese female in no acute distress.  HEENT: PERRLA Skin: normal color and turgor, no rash Breast: soft, asymmetrical, under developed nipples but no other abdnormalities Cardiovascular: normal rate & rhythm, no murmur Respiratory: normal effort, lung sounds clear throughout GI: Abd soft, non-tender, pos BS x 4, gravid appropriate for gestational age MS: Extremities nontender, no edema, normal ROM Neurologic: Alert and oriented x 4.  GU: no CVA tenderness Pelvic: exam deferred  Assessment & Plan:  Annual gynecologic examination  - Encouraged patient to proceed with genetic and chromosomal testing to see if her many health issues are related  - Explained how the pituitary glad can overstimulate other hormones trying to get the ovaries to release eggs which can cause increased estrogen, as can excess adipose tissue. Pt does not desire further treatment, just wanted to know why her hormones are "all over the place." - Mammogram ordered - Routine preventative health maintenance measures emphasized.  Follow up in one year for annual exam. Can also be completed with family med.  08/27/2013, CNM

## 2021-02-03 ENCOUNTER — Ambulatory Visit (INDEPENDENT_AMBULATORY_CARE_PROVIDER_SITE_OTHER): Payer: No Payment, Other | Admitting: Licensed Clinical Social Worker

## 2021-02-03 DIAGNOSIS — F418 Other specified anxiety disorders: Secondary | ICD-10-CM | POA: Diagnosis not present

## 2021-02-07 NOTE — Progress Notes (Signed)
   THERAPIST PROGRESS NOTE   Virtual Visit via Video Note  I connected with Catherine Koch on 02/03/21 at  2:00 PM EDT by a video enabled telemedicine application and verified that I am speaking with the correct person using two identifiers.  Location: Patient: Home Provider: [facility]  I discussed the limitations of evaluation and management by telemedicine and the availability of in person appointments. The patient expressed understanding and agreed to proceed. I discussed the assessment and treatment plan with the patient. The patient was provided an opportunity to ask questions and all were answered. The patient agreed with the plan and demonstrated an understanding of the instructions.   The patient was advised to call back or seek an in-person evaluation if the symptoms worsen or if the condition fails to improve as anticipated.  I provided 40 minutes of non-face-to-face time during this encounter.  Participation Level: Active  Behavioral Response: Fairly GroomedDrowsy and LethargicDepressed  Type of Therapy: Individual Therapy  Treatment Goals addressed: Communication: dep/anx/coping  Interventions: CBT, Supportive, and Other: grief/loss  Summary: Catherine Koch is a 45 y.o. female who presents with hx of dep/anx. This date pt signs on for video session. She is yawning and distractible throughout session with pets. Pt reports her husband's grandfather died and yesterday was the one yr [place] of his mother's death. Pt provides details on how they have been trying to cope. She states she looked for grief lit and advises she was looking in her email. Reminded pt it was mailed to her. Pt states she knows where to look for it and has intent to find/read. LCSW provided additional grief support/education. Pt reports her husband's birthday (5 yrs younger than her) is coming up. She states they will be getting together with her fam this coming weekend to celebrate and hopes it will be a  positive distraction. LCSW assessed for meds and med mangement. Pt advises last adjustment seems to have her better managed. She also states she has f/u with financial assist for meds and should be able to be more consistent. Pt advises her PCP made some adjustments in some meds and she feels her physical pain and sleep are improved, helping her mood . Pt reports she is going to be eval for narcolepsy. She denies any issues with roommates. She states she is still gaming. Denies any other new worries/concerns. LCSW reviewed poc including scheduling prior to close of session. Pt states appreciation for care.   Suicidal/Homicidal: Nowithout intent/plan  Therapist Response: Pt mostly receptive to care.  Plan: Return again in 4 weeks.  Diagnosis: Axis I:  depression with anx  [place] Sink, LCSW 02/07/2021

## 2021-02-13 ENCOUNTER — Other Ambulatory Visit: Payer: Self-pay | Admitting: Gastroenterology

## 2021-02-19 ENCOUNTER — Telehealth (INDEPENDENT_AMBULATORY_CARE_PROVIDER_SITE_OTHER): Payer: No Payment, Other | Admitting: Psychiatry

## 2021-02-19 ENCOUNTER — Other Ambulatory Visit: Payer: Self-pay

## 2021-02-19 ENCOUNTER — Encounter (HOSPITAL_COMMUNITY): Payer: Self-pay | Admitting: Psychiatry

## 2021-02-19 DIAGNOSIS — F330 Major depressive disorder, recurrent, mild: Secondary | ICD-10-CM | POA: Diagnosis not present

## 2021-02-19 DIAGNOSIS — F411 Generalized anxiety disorder: Secondary | ICD-10-CM

## 2021-02-19 MED ORDER — TRAZODONE HCL 100 MG PO TABS: 100.0000 mg | ORAL_TABLET | Freq: Every evening | ORAL | 3 refills | Status: DC | PRN

## 2021-02-19 MED ORDER — HYDROXYZINE HCL 25 MG PO TABS: 25.0000 mg | ORAL_TABLET | Freq: Every day | ORAL | 3 refills | Status: DC

## 2021-02-19 MED ORDER — DULOXETINE HCL 40 MG PO CPEP: 80.0000 mg | ORAL_CAPSULE | Freq: Every day | ORAL | 3 refills | Status: DC

## 2021-02-19 NOTE — Progress Notes (Signed)
BH MD/PA/NP OP Progress Note Virtual Visit via Video Note  I connected with Catherine Koch on 11/20/20 at  2:30 PM EDT by a video enabled telemedicine application and verified that I am speaking with the correct person using two identifiers.  Location: Patient: Home Provider: Clinic   I discussed the limitations of evaluation and management by telemedicine and the availability of in person appointments. The patient expressed understanding and agreed to proceed.  I provided 30 minutes of non-face-to-face time during this encounter.    11/20/2020 3:04 PM Catherine Koch  MRN:  [id number]  Chief Complaint: "I have been having bouts of insomnia and I would like to be tested for autism"   HPI: 45 year old female seen today  for follow-up psychiatric evaluation. She has a history of depression, PTSD,  and anxiety, and is currently managed on Hydroxyzine 25mg  at bedtime, Cymbalta 80 mg daily, and Trazodone 100 mg at bedtime.    Today she was pleasant, cooperative, engaged in conversation, and maintained eye contact. She informed that she has been having bouts of insomnia.  She informed that once every few weeks she is unable to sleep.  Provider asked patient if she continues to use her CPAP machine and she notes that she does however reports that her face mask needs to be bigger.  She informed Clinical research associate that she does have another facemask but has been unable to find it since she is unpacking her deceased Clinical research associate.  She informed Catering manager that her mother-in-law passed away a year ago and her husband's grandfather passed away in 12-22-22.  She informed writer that at times this can be overwhelming and exacerbates her anxiety and depression.  She also notes that at times she is concerned about finances.  She informed August that she has started gaming to bring more money into the home.  Provider conducted a GAD-7 and patient scored a 10, at her last visit she scored a 7.  Provider  also conducted a PHQ-9 and patient scored a 15, at her last visit she scored 8.  Patient endorses adequate appetite and notes that she is lost 13 pounds since her last visit.  Today she denies SI/HI/VAH, mania, paranoia.    Patient informed writer that she has chronic neck and back pain.  She notes that this issue could exacerbate her insomnia.  She notes that duloxetine is somewhat effective in managing her pain.    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  She notes recently she has been having issues processing auditory information and notes that in the past she has had ritualistic behaviors.  She informed Clinical research associate that she took her online test for autism which indicated that she was on the spectrum.  She informed Clinical research associate that she would like to be assessed further for autism.  Provider referred patient to Clinical research associate.  Provider informed patient that she can take 100-150mg  of trazodone as needed to help manage.  She was agreeable to this.  She will continue all other medications as prescribed and will follow up with outpatient counseling for therapy.  No other concerns at this time.    Visit Diagnosis:    ICD-10-CM   1. Generalized anxiety disorder  F41.1 hydrOXYzine (ATARAX/VISTARIL) 25 MG tablet    2. Mild episode of recurrent major depressive disorder (HCC)  F33.0 DULoxetine 40 MG CPEP    traZODone (DESYREL) 100 MG tablet      Past  Psychiatric History:  PTSD, anxiety, and depression  Past Medical History:  Past Medical History:  Diagnosis Date   Anxiety    Arthritis    pt. states in knees   Chronic gastritis    Chronic sinus infection    Closed fracture of right distal fibula 11/19/2013   Complication of anesthesia    pt. states difficult to wake up   Depression    GERD (gastroesophageal reflux disease)    Headache    pt. states random migraines   Hemorrhoids    High cholesterol    History of bronchitis     History of degenerative disc disease    Hypertension    Ovarian failure    PTSD (post-traumatic stress disorder)    Shortness of breath    exertion from crutches   Sleep apnea    cpap   UTI (lower urinary tract infection)    Wears glasses     Past Surgical History:  Procedure Laterality Date   BIOPSY  12/10/2019   Procedure: BIOPSY;  Surgeon: Napoleon Form, MD;  Location: [facility];  Service: Endoscopy;;   CHOLECYSTECTOMY  2010   CHOLECYSTECTOMY     COLONOSCOPY WITH PROPOFOL N/A 12/10/2019   Procedure: COLONOSCOPY WITH PROPOFOL;  Surgeon: Napoleon Form, MD;  Location: [facility];  Service: Endoscopy;  Laterality: N/A;   ESOPHAGOGASTRODUODENOSCOPY     ESOPHAGOGASTRODUODENOSCOPY (EGD) WITH PROPOFOL N/A 12/10/2019   Procedure: ESOPHAGOGASTRODUODENOSCOPY (EGD) WITH PROPOFOL;  Surgeon: Napoleon Form, MD;  Location: [facility];  Service: Endoscopy;  Laterality: N/A;   NASAL SEPTOPLASTY W/ TURBINOPLASTY Bilateral 12/11/2014   Procedure: NASAL SEPTOPLASTY AND BILATERAL INFERIOR TURBINATE REDUCTION;  Surgeon: Osborn Coho, MD;  Location: [facility] OR;  Service: ENT;  Laterality: Bilateral;   ORIF FIBULA FRACTURE Right 11/19/2013   Procedure: OPEN REDUCTION INTERNAL FIXATION (ORIF) RIGHT FIBULA FRACTURE;  Surgeon: Eulas Post, MD;  Location: [facility] OR;  Service: Orthopedics;  Laterality: Right;   WISDOM TOOTH EXTRACTION      Family Psychiatric History: Sister anxiety and depression. Notes mother has untreated mental health conditions  Family History:  Family History  Problem Relation Age of Onset   Allergies Sister    Heart disease Paternal Grandmother    Cancer Paternal Grandmother        BREAST CANCER   Cancer Paternal Grandfather     Social History:  Social History   Socioeconomic History   Marital status: Married    Spouse name: Not on file   Number of children: Not on file   Years of education: Not on file   Highest education level: Not on file   Occupational History    Comment: UNEMPOLYEED   Tobacco Use   Smoking status: Former    Pack years: 0.00    Types: Cigarettes    Quit date: 05/24/2010    Years since quitting: 10.5   Smokeless tobacco: Never   Tobacco comments:    Pt. states she used to be a social smoker  Vaping Use   Vaping Use: Never used  Substance and Sexual Activity   Alcohol use: Not Currently   Drug use: No   Sexual activity: Yes    Birth control/protection: None  Other Topics Concern   Not on file  Social History Narrative   Not on file   Social Determinants of Health   Financial Resource Strain: Not on file  Food Insecurity: Not on file  Transportation Needs: Not on file  Physical Activity: Not on file  Stress: Not on file  Social Connections: Not on file    Allergies:  Allergies  Allergen Reactions   Sulfa Antibiotics Anaphylaxis, Swelling and Rash    Throat swelling/ rashes    Metabolic Disorder Labs: No results found for: HGBA1C, MPG No results found for: PROLACTIN No results found for: CHOL, TRIG, HDL, CHOLHDL, VLDL, LDLCALC Lab Results  Component Value Date   TSH 1.90 04/11/2012    Therapeutic Level Labs: No results found for: LITHIUM No results found for: VALPROATE No components found for:  CBMZ  Current Medications: Current Outpatient Medications  Medication Sig Dispense Refill   acetaminophen (TYLENOL) 500 MG tablet Take 1,000 mg by mouth every 6 (six) hours as needed for fever.     cefpodoxime (VANTIN) 200 MG tablet Take 1 tablet (200 mg total) by mouth 2 (two) times daily. 20 tablet 0   diclofenac (VOLTAREN) 75 MG EC tablet Take 75 mg by mouth 2 (two) times daily.     DULoxetine 40 MG CPEP Take 80 mg by mouth daily. 60 capsule 2   fluticasone (FLONASE) 50 MCG/ACT nasal spray Place 1 spray into both nostrils daily. (Patient not taking: Reported on 11/11/2020) 16 g 0   hydrOXYzine (ATARAX/VISTARIL) 25 MG tablet Take 1 tablet (25 mg total) by mouth at bedtime. 30 tablet 2    levocetirizine (XYZAL) 5 MG tablet Take 5 mg by mouth daily at 6 (six) AM.     metFORMIN (GLUMETZA) 500 MG (MOD) 24 hr tablet Take 500 mg by mouth daily with breakfast.     metoprolol tartrate (LOPRESSOR) 25 MG tablet Take 25 mg by mouth 2 (two) times daily.     misoprostol (CYTOTEC) 200 MCG tablet Take 200 mcg by mouth 2 (two) times daily as needed (stomach ulcers).     naproxen (NAPROSYN) 500 MG tablet Take 1 tablet (500 mg total) by mouth 2 (two) times daily as needed for moderate pain. 15 tablet 0   omeprazole (PRILOSEC) 40 MG capsule Take 1 capsule (40 mg total) by mouth daily. 90 capsule 3   ondansetron (ZOFRAN ODT) 4 MG disintegrating tablet Take 1 tablet (4 mg total) by mouth every 8 (eight) hours as needed for nausea or vomiting. 8 tablet 0   spironolactone (ALDACTONE) 25 MG tablet Take 25 mg by mouth daily.     traZODone (DESYREL) 100 MG tablet Take 1 tablet (100 mg total) by mouth at bedtime as needed for sleep. 30 tablet 2   Turmeric (QC TUMERIC COMPLEX PO) Take 1 tablet by mouth daily.     No current facility-administered medications for this visit.     Musculoskeletal: Strength & Muscle Tone:  Unable to assess due to telehealth visit Gait & Station:  Unable to assess due to telehealth visit Patient leans: N/A  Psychiatric Specialty Exam: Review of Systems  Last menstrual period 06/29/2013.There is no height or weight on file to calculate BMI.  General Appearance: Well Groomed  Eye Contact:  Good  Speech:  Clear and Coherent and Normal Rate  Volume:  Normal  Mood:  Euthymic notes occasionally anxious and depressed due to finances and housing situation  Affect:  Congruent  Thought Process:  Coherent, Goal Directed and Linear  Orientation:  Full (Time, Place, and Person)  Thought Content: WDL and Logical   Suicidal Thoughts:  No  Homicidal Thoughts:  No  Memory:  Immediate;   Good Recent;   Good Remote;   Good  Judgement:  Good  Insight:  Good  Psychomotor  Activity:  Normal  Concentration:  Concentration: Good and Attention Span: Good  Recall:  Good  Fund of Knowledge: Good  Language: Good  Akathisia:  No  Handed:  Right  AIMS (if indicated): Not done  Assets:  Communication Skills Desire for Improvement Financial Resources/Insurance Housing Intimacy Social Support  ADL's:  Intact  Cognition: WNL  Sleep:  Good   Screenings: GAD-7    Flowsheet Row Video Visit from 11/20/2020 in [facility] Video Visit from 08/26/2020 in [facility] Video Visit from 05/28/2020 in [facility]  Total GAD-7 Score 7 15 9       PHQ2-9    Flowsheet Row Video Visit from 11/20/2020 in [facility] Video Visit from 08/26/2020 in [facility] Video Visit from 05/28/2020 in [facility] Nutrition [facility]18/2013 [facility]  PHQ-2 Total Score 2 4 2 2   PHQ-9 Total Score 8 17 13 7       Flowsheet Row Video Visit from 11/20/2020 in [facility] ED from 11/11/2020 in [facility] Video Visit from 08/26/2020 in [facility]  C-SSRS RISK CATEGORY No Risk No Risk No Risk        Assessment and Plan: Patient notes that her anxiety, depression, and sleep have worsened since her last visit.  Provider instructed patient to take 100 to 150 mg of trazodone nightly to help manage sleep.  Patient also notes that she feels like she has autism and would like further assessment.  Patient referred to [facility].  No other medication changes made.  Patient agreeable to taking medications as prescribed.   1. Mild episode of recurrent major depressive disorder (HCC)  Continue- DULoxetine HCl 40 MG CPEP; Take 80 mg by mouth daily.  Dispense: 60 capsule; Refill: 3 Continue- traZODone (DESYREL) 100  MG tablet; Take 1-1.5 tablets (100-150 mg total) by mouth at bedtime as needed for sleep.  Dispense: 45 tablet; Refill: 3  2. Generalized anxiety disorder  Continue- hydrOXYzine (ATARAX/VISTARIL) 25 MG tablet; Take 1 tablet (25 mg total) by mouth at bedtime.  Dispense: 30 tablet; Refill: 3    Follow-up in 3 months Follow-up with therapy   [place], NP 11/20/2020, 3:04 PM

## 2021-03-02 ENCOUNTER — Ambulatory Visit (INDEPENDENT_AMBULATORY_CARE_PROVIDER_SITE_OTHER): Payer: No Payment, Other | Admitting: Licensed Clinical Social Worker

## 2021-03-02 ENCOUNTER — Other Ambulatory Visit: Payer: Self-pay

## 2021-03-02 DIAGNOSIS — F418 Other specified anxiety disorders: Secondary | ICD-10-CM

## 2021-03-02 NOTE — Progress Notes (Signed)
THERAPIST PROGRESS NOTE   Virtual Visit via Telephone Note  I connected with Catherine Koch on 03/02/21 at  2:00 PM EDT by telephone and verified that I am speaking with the correct person using two identifiers.  Location: Patient: Home Provider: [facility]   I discussed the limitations, risks, security and privacy concerns of performing an evaluation and management service by telephone and the availability of in person appointments. I also discussed with the patient that there may be a patient responsible charge related to this service. The patient expressed understanding and agreed to proceed. I discussed the assessment and treatment plan with the patient. The patient was provided an opportunity to ask questions and all were answered. The patient agreed with the plan and demonstrated an understanding of the instructions.   The patient was advised to call back or seek an in-person evaluation if the symptoms worsen or if the condition fails to improve as anticipated.  I provided 45 minutes of non-face-to-face time during this encounter.  Participation Level: Active  Behavioral Response: CasualAlertDepressed  Type of Therapy: Individual Therapy  Treatment Goals addressed: Communication: dep/anx/coping  Interventions: Solution Focused  Summary: Catherine Koch is a 45 y.o. female who presents with hx of dep/anx. This date pt returns for virtual session. Pt can see and hear clinician. LCSW can see pt but there is no audio. Pt attempts to make adjustments are not successful. Pt accepts phone call. Pt reports she is managing fair. She states she has stress over the condition of the home. Reports when her environment is more chaotic she is more anx. Pt states spouse does not help around the house. Pt advises she is only able to do "little by little" before she starts hurting. Assessment of role of two roommates reveals pt saying Leafy does what she can r/t her own disabilities and her partner  is "not doing what she is supposed to". Pt states Leafy does address issue with partner but the help is not consistent. Pt reports they got money from [employer] estate and there is a housekeeper coming today to give an estimate on getting things cleaned up so pt can try to maintain it. Pt states Jonny Ruiz is trying to get her out of the house more to help her mood. She states they recently had a date night which was nice since they rarely have time completely alone given roommates. Pt states she has all meds at present. Again discussed prioritizing meds in finances. Pt has not completed med assist application, which was strongly encouraged. Pt advises she spoke with Dr. Doyne Keel about the possibility of her have Autism and pt is being scheduled for testing once she completes paperwork. Additional discussion of finances facilitated with pt admitting Jonny Ruiz is not a good Human resources officer and has never lived on his own without his mother's help/support. LCSW made referral for financial counseling at [facility] [place]. Pt states she will talk with Jonny Ruiz about going there together. Pt reports recent visit with her fam went well until the end. She states her mother again addressed her working/getting a job. Pt provides many details. Pt's parents continue to help them intermittently with finances. Pt does continue to game for some income. Pt reports she is still hoping for disability to come through. Pt denies any other new worries/concerns. LCSW reviewed poc including scheduling prior to close of session. Pt states appreciation for care.    Suicidal/Homicidal: Nowithout intent/plan  Therapist Response: Pt receptive to care.  Plan:  Return again in 4 weeks. Plan new CCA.  Diagnosis: Axis I: Depression with anx   [place] Sink, LCSW 03/02/2021

## 2021-04-10 ENCOUNTER — Ambulatory Visit (INDEPENDENT_AMBULATORY_CARE_PROVIDER_SITE_OTHER): Payer: No Payment, Other | Admitting: Licensed Clinical Social Worker

## 2021-04-10 DIAGNOSIS — F330 Major depressive disorder, recurrent, mild: Secondary | ICD-10-CM
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
## 2021-05-04 ENCOUNTER — Other Ambulatory Visit (HOSPITAL_COMMUNITY): Payer: Self-pay | Admitting: Psychiatry

## 2021-05-04 DIAGNOSIS — F411 Generalized anxiety disorder: Secondary | ICD-10-CM

## 2021-05-04 DIAGNOSIS — F330 Major depressive disorder, recurrent, mild: Secondary | ICD-10-CM

## 2021-05-14 ENCOUNTER — Ambulatory Visit (INDEPENDENT_AMBULATORY_CARE_PROVIDER_SITE_OTHER): Payer: No Payment, Other | Admitting: Licensed Clinical Social Worker

## 2021-05-14 DIAGNOSIS — F331 Major depressive disorder, recurrent, moderate: Secondary | ICD-10-CM | POA: Diagnosis not present

## 2021-05-14 NOTE — Progress Notes (Signed)
° °  THERAPIST PROGRESS NOTE  Virtual Visit via Video Note  I connected with Catherine Koch on 05/14/21 at 10:00 AM EST by a video enabled telemedicine application and verified that I am speaking with the correct person using two identifiers.  Location: Patient: Home Provider: [facility]   I discussed the limitations of evaluation and management by telemedicine and the availability of in person appointments. The patient expressed understanding and agreed to proceed. I discussed the assessment and treatment plan with the patient. The patient was provided an opportunity to ask questions and all were answered. The patient agreed with the plan and demonstrated an understanding of the instructions.   The patient was advised to call back or seek an in-person evaluation if the symptoms worsen or if the condition fails to improve as anticipated.  I provided 35 minutes of non-face-to-face time during this encounter.  Participation Level: Active  Behavioral Response: Casual and Fairly GroomedLethargicDepressed  Type of Therapy: Individual Therapy  Treatment Goals addressed: Communication: dep/coping  Interventions: Supportive  Summary: Catherine Koch is a 45 y.o. female who presents with hx of dep/anx.  Today patient returns for video session.  When asked how she is patient reports "tired" and then states "I guess okay".  Her tone of voice is low, avoids eye contact, limited facial expression. Reports she has not been showering as she should. Sleeping when she uses Trazodone.  Patient reports she heard from Social Security disability and has doctor appointments with Surveyor, mining.  Patient reports this is her third time applying for disability and she does have legal counsel this time.  Patient also states she got approved for the medication assistance program with all of her medications covered for a year.  Patient reports she is currently taking medications as prescribed and is  relieved she will not be concerned about being able to get her medicines due to finances.  Patient states she is worried about her husband continuing to have stress over finances.  She states he did not follow through with getting counseling and medication assistance for himself.  She reports he is more irritable and recently punched the door.  Patient states her husband plans to call today for an appointment.  She is encouraged to remind him to do so post session.  Patient reports she is also concerned about her roommate she calls Leafy.  Patient reports roommate and her partner broke up and the partner moved back to [place].  Patient states roommate is distressed over this and she is trying to be supportive.  Patient continues to have multiple physical complaints.  She states she recently started using biosalts from PCP related to her gallbladder removal and this has been very helpful.  LCSW facilitated discussion of the holidays.  Patient states they will go to [place] to her family's Christmas Eve, Christmas Day patient, spouse and roommate plan a quiet dinner together, couple has plans with spouse's aunt and uncle New Year's day and will be having friends over to their home on the years eve.  Patient states her New Year's resolution is to focus on getting healthier.  Patient denies other worries concerns this date. LCSW reviewed poc including scheduling prior to close of session. Pt states appreciation for care.   Suicidal/Homicidal: Nowithout intent/plan  Therapist Response: Pt mostly receptive to care.  Plan: Return again in ~5 weeks.  Diagnosis: Axis I:  MDD, moderate  Catarina Sink, LCSW 05/14/2021

## 2021-05-22 ENCOUNTER — Telehealth (INDEPENDENT_AMBULATORY_CARE_PROVIDER_SITE_OTHER): Payer: No Payment, Other | Admitting: Psychiatry

## 2021-05-22 ENCOUNTER — Encounter (HOSPITAL_COMMUNITY): Payer: Self-pay | Admitting: Psychiatry

## 2021-05-22 DIAGNOSIS — F411 Generalized anxiety disorder: Secondary | ICD-10-CM

## 2021-05-22 DIAGNOSIS — F330 Major depressive disorder, recurrent, mild: Secondary | ICD-10-CM | POA: Diagnosis not present

## 2021-05-22 MED ORDER — TRAZODONE HCL 100 MG PO TABS: 100.0000 mg | ORAL_TABLET | Freq: Every evening | ORAL | 3 refills | Status: DC | PRN

## 2021-05-22 MED ORDER — DULOXETINE HCL 40 MG PO CPEP: 80.0000 mg | ORAL_CAPSULE | Freq: Every day | ORAL | 3 refills | Status: DC

## 2021-05-22 MED ORDER — HYDROXYZINE HCL 25 MG PO TABS: 25.0000 mg | ORAL_TABLET | Freq: Every day | ORAL | 3 refills | Status: DC

## 2021-05-22 NOTE — Progress Notes (Signed)
BH MD/PA/NP OP Progress Note Virtual Visit via Video Note  I connected with Catherine Koch on 05/22/21 at 11:30 AM EST by a video enabled telemedicine application and verified that I am speaking with the correct person using two identifiers.  Location: Patient: Home Provider: Clinic   I discussed the limitations of evaluation and management by telemedicine and the availability of in person appointments. The patient expressed understanding and agreed to proceed.  I provided 30 minutes of non-face-to-face time during this encounter.    05/22/2021 11:49 AM DEAUNDRA Koch  MRN:  [id number]  Chief Complaint: "I have been tired"   HPI: 45 year old female seen today  for follow-up psychiatric evaluation. She has a history of depression, PTSD,  and anxiety, and is currently managed on Hydroxyzine 25mg  at bedtime, Cymbalta 80 mg daily, and Trazodone 150 mg at bedtime.    Today she was pleasant, cooperative, engaged in conversation, and maintained eye contact. She informed that she has been tired. She notes that she has a UTI and reports that it is taking a physical toll on her. She however notes that her pain has improved since starting Voltaren. She also reports that with the reduction of pain her sleep has improved. She now notes that she sleeps 5-10 hours nightly.Patient informed that she has been utilizing her CPAP more and is sleeping better. She informed Clinical research associate that her appetite has been reduced since being sick but notes that she is maintaining her weight.  Patient reports at times she is concerned/anxious about her health, husband, and finances but reports that she is able to cope with theses stressors. Today provider conducted a GAD-7 and patient scored a 13, at her last visit she scored a 10.  Provider also conducted a PHQ-9 and patient scored a 12, at her last visit she scored 15.  Today she denies SI/HI/VAH, mania, paranoia.     No medication changes made today. She will  continue all medications as prescribed and will follow up with outpatient counseling for therapy.  No other concerns at this time.    Visit Diagnosis:    ICD-10-CM   1. Mild episode of recurrent major depressive disorder (HCC)  F33.0 DULoxetine HCl 40 MG CPEP    traZODone (DESYREL) 100 MG tablet    2. Generalized anxiety disorder  F41.1 hydrOXYzine (ATARAX) 25 MG tablet      Past Psychiatric History:  PTSD, anxiety, and depression  Past Medical History:  Past Medical History:  Diagnosis Date   Anxiety    Arthritis    pt. states in knees   Chronic gastritis    Chronic sinus infection    Closed fracture of right distal fibula 11/19/2013   Complication of anesthesia    pt. states difficult to wake up   Depression    GERD (gastroesophageal reflux disease)    Headache    pt. states random migraines   Hemorrhoids    High cholesterol    History of bronchitis    History of degenerative disc disease    Hypertension    Ovarian failure    PTSD (post-traumatic stress disorder)    Shortness of breath    exertion from crutches   Sleep apnea    cpap   UTI (lower urinary tract infection)    Wears glasses     Past Surgical History:  Procedure Laterality Date   BIOPSY  12/10/2019   Procedure: BIOPSY;  Surgeon: 12/12/2019, MD;  Location: [facility];  Service: Endoscopy;;   CHOLECYSTECTOMY  2010   CHOLECYSTECTOMY     COLONOSCOPY WITH PROPOFOL N/A 12/10/2019   Procedure: COLONOSCOPY WITH PROPOFOL;  Surgeon: Napoleon Form, MD;  Location: [facility];  Service: Endoscopy;  Laterality: N/A;   ESOPHAGOGASTRODUODENOSCOPY     ESOPHAGOGASTRODUODENOSCOPY (EGD) WITH PROPOFOL N/A 12/10/2019   Procedure: ESOPHAGOGASTRODUODENOSCOPY (EGD) WITH PROPOFOL;  Surgeon: Napoleon Form, MD;  Location: [facility];  Service: Endoscopy;  Laterality: N/A;   NASAL SEPTOPLASTY W/ TURBINOPLASTY Bilateral 12/11/2014   Procedure: NASAL SEPTOPLASTY AND BILATERAL INFERIOR TURBINATE REDUCTION;   Surgeon: Osborn Coho, MD;  Location: [facility];  Service: ENT;  Laterality: Bilateral;   ORIF FIBULA FRACTURE Right 11/19/2013   Procedure: OPEN REDUCTION INTERNAL FIXATION (ORIF) RIGHT FIBULA FRACTURE;  Surgeon: Eulas Post, MD;  Location: [facility] OR;  Service: Orthopedics;  Laterality: Right;   WISDOM TOOTH EXTRACTION      Family Psychiatric History: Sister anxiety and depression. Notes mother has untreated mental health conditions  Family History:  Family History  Problem Relation Age of Onset   Allergies Sister    Heart disease Paternal Grandmother    Cancer Paternal Grandmother        BREAST CANCER   Cancer Paternal Grandfather     Social History:  Social History   Socioeconomic History   Marital status: Married    Spouse name: Not on file   Number of children: Not on file   Years of education: Not on file   Highest education level: Not on file  Occupational History    Comment: UNEMPOLYEED   Tobacco Use   Smoking status: Former    Types: Cigarettes    Quit date: 05/24/2010    Years since quitting: 11.0    Passive exposure: Never   Smokeless tobacco: Never   Tobacco comments:    Pt. states she used to be a social smoker  Vaping Use   Vaping Use: Never used  Substance and Sexual Activity   Alcohol use: Not Currently   Drug use: No   Sexual activity: Yes    Birth control/protection: None  Other Topics Concern   Not on file  Social History Narrative   Not on file   Social Determinants of Health   Financial Resource Strain: Not on file  Food Insecurity: Not on file  Transportation Needs: Not on file  Physical Activity: Not on file  Stress: Not on file  Social Connections: Not on file    Allergies:  Allergies  Allergen Reactions   Sulfa Antibiotics Anaphylaxis, Swelling and Rash    Throat swelling/ rashes    Metabolic Disorder Labs: No results found for: HGBA1C, MPG No results found for: PROLACTIN No results found for: CHOL, TRIG, HDL, CHOLHDL, VLDL,  LDLCALC Lab Results  Component Value Date   TSH 1.90 04/11/2012    Therapeutic Level Labs: No results found for: LITHIUM No results found for: VALPROATE No components found for:  CBMZ  Current Medications: Current Outpatient Medications  Medication Sig Dispense Refill   acetaminophen (TYLENOL) 500 MG tablet Take 1,000 mg by mouth every 6 (six) hours as needed for fever.     diclofenac (VOLTAREN) 75 MG EC tablet Take 75 mg by mouth 2 (two) times daily.     DULoxetine HCl 40 MG CPEP Take 80 mg by mouth daily. 60 capsule 3   hydrOXYzine (ATARAX) 25 MG tablet Take 1 tablet (25 mg total) by mouth at bedtime. 30 tablet 3   metFORMIN (GLUMETZA) 500 MG (MOD)  24 hr tablet Take 500 mg by mouth daily with breakfast.     metoprolol tartrate (LOPRESSOR) 25 MG tablet Take 25 mg by mouth 2 (two) times daily.     misoprostol (CYTOTEC) 200 MCG tablet Take 200 mcg by mouth 2 (two) times daily as needed (stomach ulcers).     omeprazole (PRILOSEC) 40 MG capsule TAKE 1 CAPSULE BY MOUTH DAILY 90 capsule 3   ondansetron (ZOFRAN ODT) 4 MG disintegrating tablet Take 1 tablet (4 mg total) by mouth every 8 (eight) hours as needed for nausea or vomiting. 8 tablet 0   spironolactone (ALDACTONE) 25 MG tablet Take 25 mg by mouth daily.     traZODone (DESYREL) 100 MG tablet Take 1-1.5 tablets (100-150 mg total) by mouth at bedtime as needed for sleep. 45 tablet 3   Turmeric (QC TUMERIC COMPLEX PO) Take 1 tablet by mouth daily.     No current facility-administered medications for this visit.     Musculoskeletal: Strength & Muscle Tone:  Unable to assess due to telehealth visit Gait & Station:  Unable to assess due to telehealth visit Patient leans: N/A  Psychiatric Specialty Exam: Review of Systems  Last menstrual period 06/29/2013.There is no height or weight on file to calculate BMI.  General Appearance: Well Groomed  Eye Contact:  Good  Speech:  Clear and Coherent and Normal Rate  Volume:  Normal  Mood:   Euthymic notes occasionally anxious and depressed due to finances and housing situation  Affect:  Congruent  Thought Process:  Coherent, Goal Directed and Linear  Orientation:  Full (Time, Place, and Person)  Thought Content: WDL and Logical   Suicidal Thoughts:  No  Homicidal Thoughts:  No  Memory:  Immediate;   Good Recent;   Good Remote;   Good  Judgement:  Good  Insight:  Good  Psychomotor Activity:  Normal  Concentration:  Concentration: Good and Attention Span: Good  Recall:  Good  Fund of Knowledge: Good  Language: Good  Akathisia:  No  Handed:  Right  AIMS (if indicated): Not done  Assets:  Communication Skills Desire for Improvement Financial Resources/Insurance Housing Intimacy Social Support  ADL's:  Intact  Cognition: WNL  Sleep:  Good   Screenings: GAD-7    Flowsheet Row Video Visit from 05/22/2021 in [facility] Video Visit from 02/19/2021 in [facility] Video Visit from 11/20/2020 in [facility] Video Visit from 08/26/2020 in [facility] Video Visit from 05/28/2020 in [facility]  Total GAD-7 Score 13 10 7 15 9       PHQ2-9    Flowsheet Row Video Visit from 05/22/2021 in [facility] Video Visit from 02/19/2021 in [facility] Video Visit from 11/20/2020 in [facility] Video Visit from 08/26/2020 in [facility] Video Visit from 05/28/2020 in [facility]  PHQ-2 Total Score 2 4 2 4 2   PHQ-9 Total Score 12 15 8 17 13       Flowsheet Row Video Visit from 11/20/2020 in [facility] ED from 11/11/2020 in [facility] Video Visit from 08/26/2020 in [facility]  C-SSRS RISK CATEGORY No Risk No Risk No Risk         Assessment and Plan: Patient notes that she is doing well on her current medication regimen. No  medication  changes made today.  Patient agreeable to taking medications as prescribed.   1. Mild episode of recurrent major depressive disorder (HCC)  Continue- DULoxetine HCl 40 MG CPEP; Take 80 mg by mouth daily.  Dispense: 60 capsule; Refill: 3 Continue- traZODone (DESYREL) 100 MG tablet; Take 1-1.5 tablets (100-150 mg total) by mouth at bedtime as needed for sleep.  Dispense: 45 tablet; Refill: 3  2. Generalized anxiety disorder  Continue- hydrOXYzine (ATARAX/VISTARIL) 25 MG tablet; Take 1 tablet (25 mg total) by mouth at bedtime.  Dispense: 30 tablet; Refill: 3    Follow-up in 3 months Follow-up with therapy   Shanna Cisco, NP 05/22/2021, 11:49 AM

## 2021-06-01 ENCOUNTER — Other Ambulatory Visit (HOSPITAL_COMMUNITY): Payer: Self-pay | Admitting: Psychiatry

## 2021-06-01 ENCOUNTER — Telehealth (HOSPITAL_COMMUNITY): Payer: Self-pay | Admitting: *Deleted

## 2021-06-01 DIAGNOSIS — F411 Generalized anxiety disorder: Secondary | ICD-10-CM

## 2021-06-01 DIAGNOSIS — F330 Major depressive disorder, recurrent, mild: Secondary | ICD-10-CM

## 2021-06-01 MED ORDER — TRAZODONE HCL 100 MG PO TABS: 100.0000 mg | ORAL_TABLET | Freq: Every evening | ORAL | 3 refills | Status: DC | PRN

## 2021-06-01 MED ORDER — DULOXETINE HCL 40 MG PO CPEP: 80.0000 mg | ORAL_CAPSULE | Freq: Every day | ORAL | 3 refills | Status: DC

## 2021-06-01 MED ORDER — HYDROXYZINE HCL 25 MG PO TABS: 25.0000 mg | ORAL_TABLET | Freq: Every day | ORAL | 3 refills | Status: DC

## 2021-06-01 NOTE — Telephone Encounter (Signed)
Medication refilled and sent to preferred pharmacy

## 2021-06-01 NOTE — Telephone Encounter (Signed)
Patient Catherine Koch stating that she's now using [facility] [place], [place]  & requested all her med's including refills be sent to new RX.  NO longer using [facility] [place] [place]

## 2021-06-26 ENCOUNTER — Ambulatory Visit (INDEPENDENT_AMBULATORY_CARE_PROVIDER_SITE_OTHER): Payer: No Payment, Other | Admitting: Licensed Clinical Social Worker

## 2021-06-26 DIAGNOSIS — F331 Major depressive disorder, recurrent, moderate: Secondary | ICD-10-CM

## 2021-07-09 NOTE — Progress Notes (Signed)
THERAPIST PROGRESS NOTE   Virtual Visit via Video Note  I connected with Catherine Koch on 06/26/21 at 10:00 AM EST by a video enabled telemedicine application and verified that I am speaking with the correct person using two identifiers.  Location: Patient: Home Provider: Home   I discussed the limitations of evaluation and management by telemedicine and the availability of in person appointments. The patient expressed understanding and agreed to proceed. I discussed the assessment and treatment plan with the patient. The patient was provided an opportunity to ask questions and all were answered. The patient agreed with the plan and demonstrated an understanding of the instructions.   The patient was advised to call back or seek an in-person evaluation if the symptoms worsen or if the condition fails to improve as anticipated.  I provided 44 min minutes of non-face-to-face time during this encounter.  Participation Level: Active  Behavioral Response: CasualAlertDepressed  Type of Therapy: Individual Therapy  Treatment Goals addressed: dep/stressors/coping  ProgressTowards Goals: Progressing  Interventions: Supportive  Summary: Catherine Koch is a 45 y.o. female who presents with hx of dep/anx.  Today patient logs on for video session per schedule.  Patient last seen May 14, 2021.  LCSW assessed for coping with the holidays and any significant changes.  Patient reports the holidays were "not bad".  She advises the time spent with her side of the family in [place] went well as there were no arguments and no controversies.  Catherine Koch states the plans they had for New Year's Eve with friends fell through but they did go on New Year's eve to her husband's aunt and uncle's home and this was a good gathering.  She mentions the ongoing feelings of loss and grief associated with the death of her mother-in-law.  LCSW validates patient's stated feelings.  LCSW assessed for  whether or not patient's husband has secured the help for his mental health patient feels he needs.  She states he has still not followed through with obtaining help but this remains a goal.  Patient advises she has recently had a discussion with her spouse about taking over their finances and paying the bills.  Patient reports they are 2 months behind on their mortgage.  Patient provides details of his inappropriate spending and how stressful this is.  She states he has agreed for her to take over the finances but has not taken any steps to make that happen as yet.  LCSW encouraged her to be assertive in facilitating this change, getting access to all the info she needs since their housing is currently at risk.  She advises they are probably going to have to ask her parents for help again which she dreads doing. 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  Patient reports how helpful it would be if she could get her disability.  She provides details of her medical and psych visits with disability providers.  Per patient she said the psychiatrist told her two hopeful things: She states he said "You should know in a couple of weeks" and also said "Do not worry you are going to get it".  Patient speaks about how her physical pain that is poorly managed has increased her feelings of depression.  She speaks about her rheumatoid arthritis being more symptomatic.  When asked she states she has not seen a rheumatologist and is not sure where this diagnosis came from.  Patient reports her functional status is significantly less which also increases her feelings of depression and worthlessness. Pt advises she is taking all meds as prescribed. LCSW addressed coping. Pt says "I do not know what I would do if it were not for my fur babies".  Patient reports her best friend is coming for a visit in a few weeks which gives her something to look forward to.   Patient advises this friend, who lives in [place], [place],  intends to help her with a number of things around the house that she has not been able to do. LCSW provided encouragement for her to enjoy her time with her friend. LCSW reviewed poc including scheduling prior to close of session. Pt states appreciation for care.   Suicidal/Homicidal: Nowithout intent/plan  Therapist Response: Pt remains receptive to care.  Plan: Return again in ~5 weeks.  Diagnosis: Major depressive disorder, recurrent episode, moderate (HCC)  Collaboration of Care: Other None deemed needed this session.  Patient/Guardian was advised Release of Information must be obtained prior to any record release in order to collaborate their care with an outside provider. Patient/Guardian was advised if they have not already done so to contact the registration department to sign all necessary forms in order for [place] to release information regarding their care.   Consent: Patient/Guardian gives verbal consent for treatment and assignment of benefits for services provided during this visit. Patient/Guardian expressed understanding and agreed to proceed.   Powell Sink, LCSW 07/09/2021

## 2021-07-21 IMAGING — CT CT ABD-PELV W/ CM
2 of 5 series · 16 of 46 positions shown, 18 images · IV contrast (APPLIED)
Comparison: August 31, 2016

CLINICAL DATA: Left lower abdominal pain.

EXAM:
CT ABDOMEN AND PELVIS WITH CONTRAST
TECHNIQUE: Multidetector CT imaging of the abdomen and pelvis was performed
using the standard protocol following bolus administration of
intravenous contrast.
CONTRAST:  100mL OMNIPAQUE IOHEXOL 300 MG/ML  SOLN

[Series 2: axial st · axial · 0.82mm/px · z∈[+1142,+1608]mm · 13 of 109 slices shown, 15 images]
[im 8/109  soft-tissue]
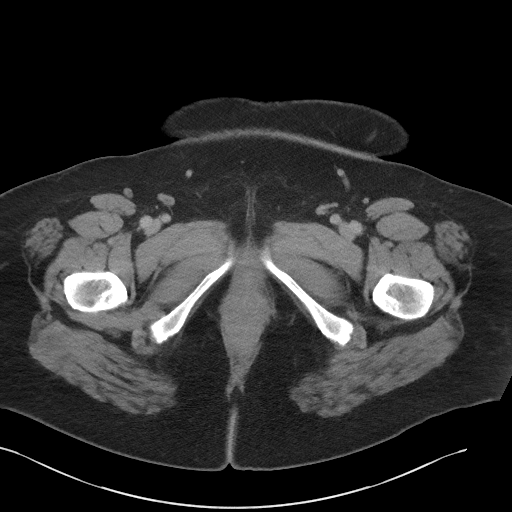
[im 8/109  bone]
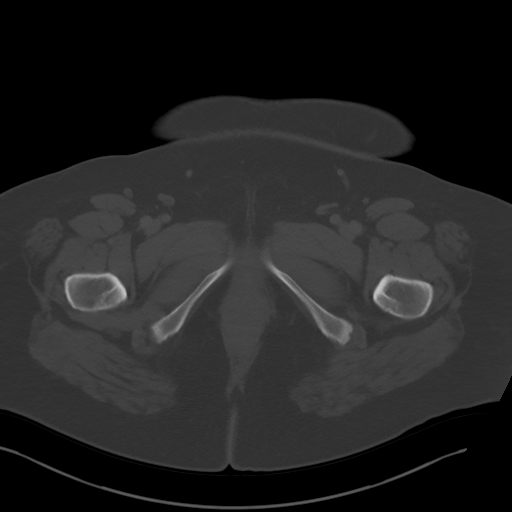
[im 15/109  soft-tissue]
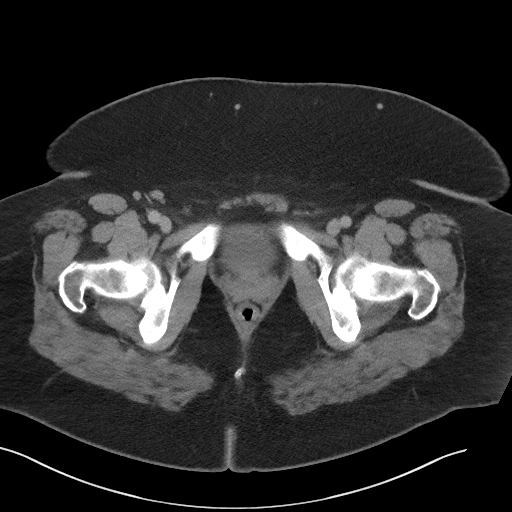
[im 22/109  soft-tissue]
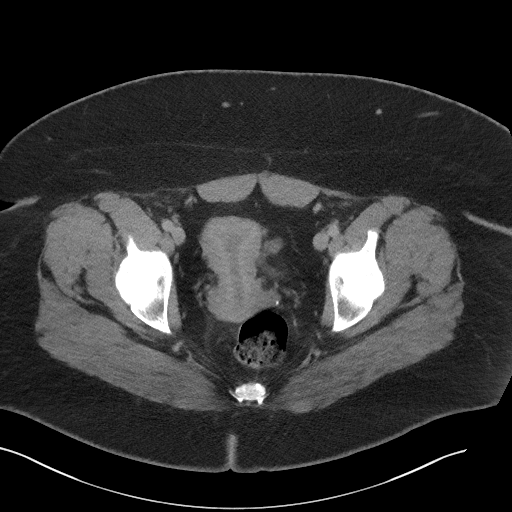
[im 29/109  soft-tissue]
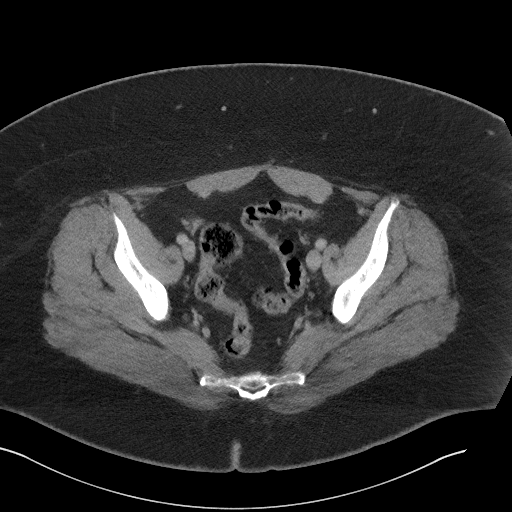
[im 37/109  soft-tissue]
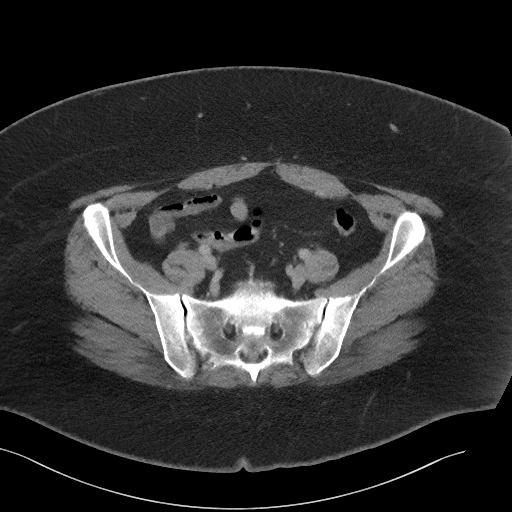
[im 44/109  soft-tissue]
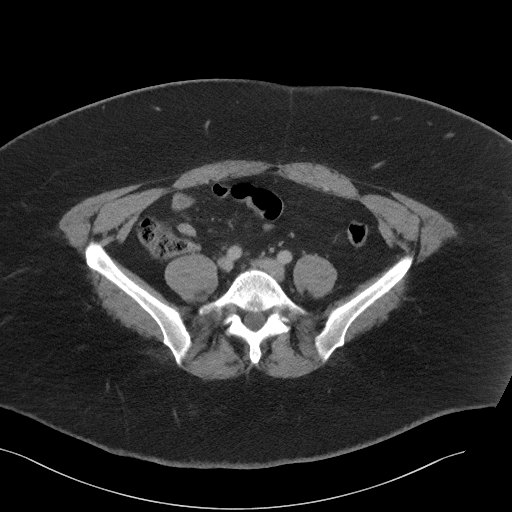
[im 58/109  soft-tissue]
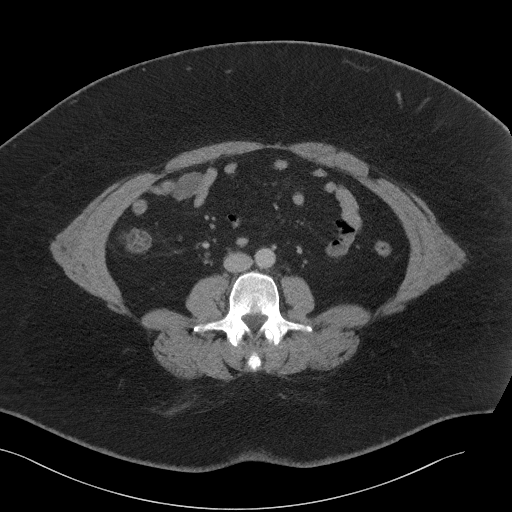
[im 65/109  soft-tissue]
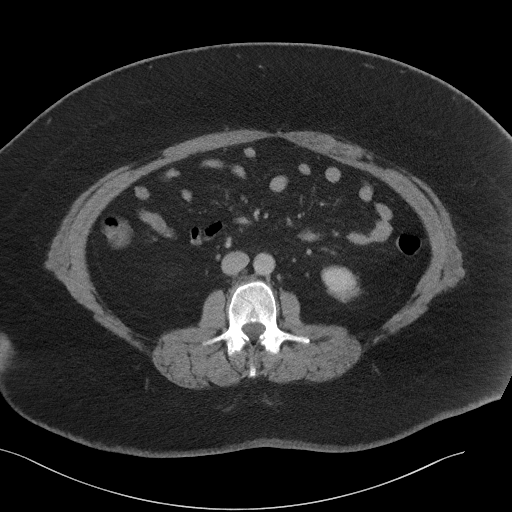
[im 73/109  soft-tissue]
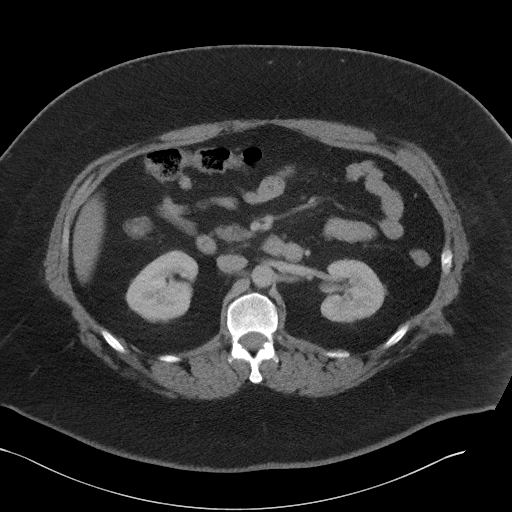
[im 73/109  bone]
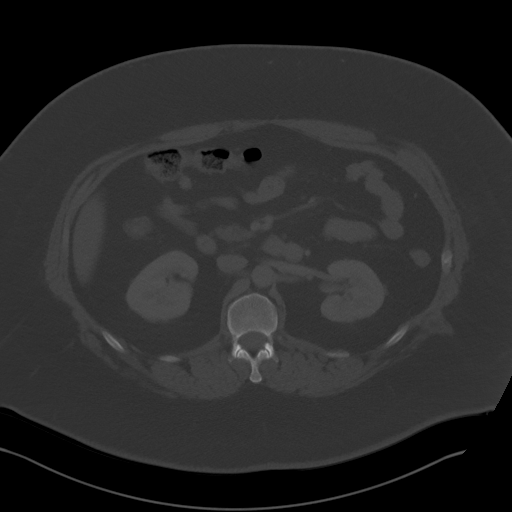
[im 80/109  soft-tissue]
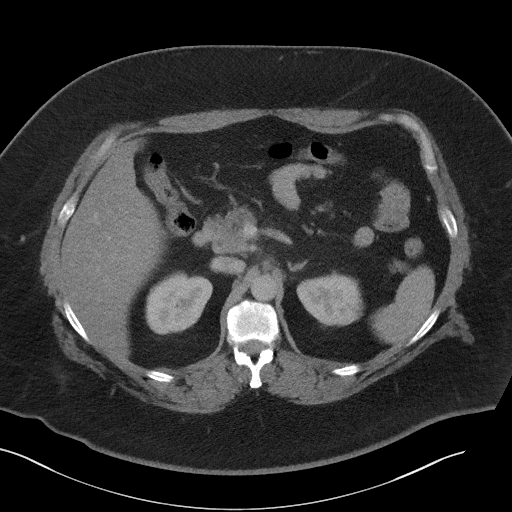
[im 87/109  soft-tissue]
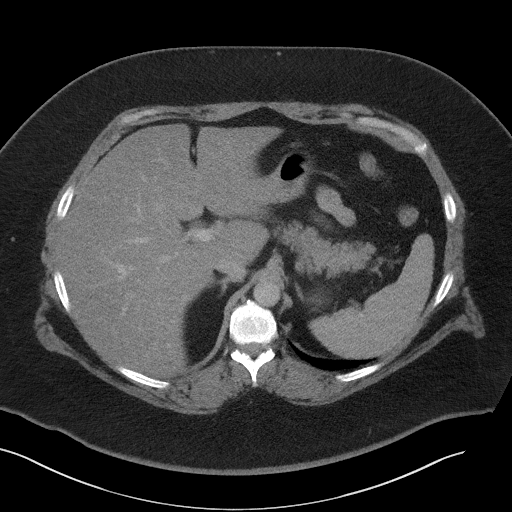
[im 94/109  soft-tissue]
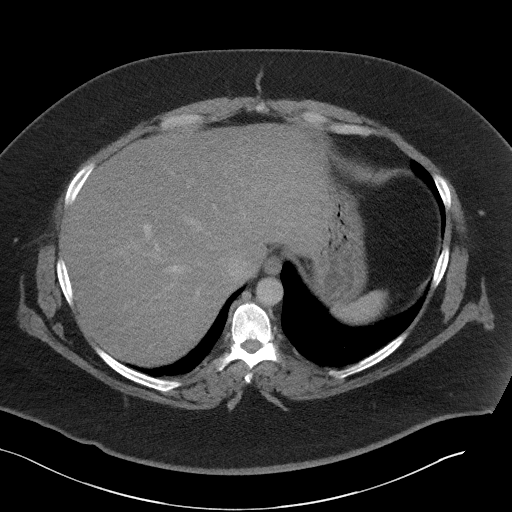
[im 101/109  soft-tissue]
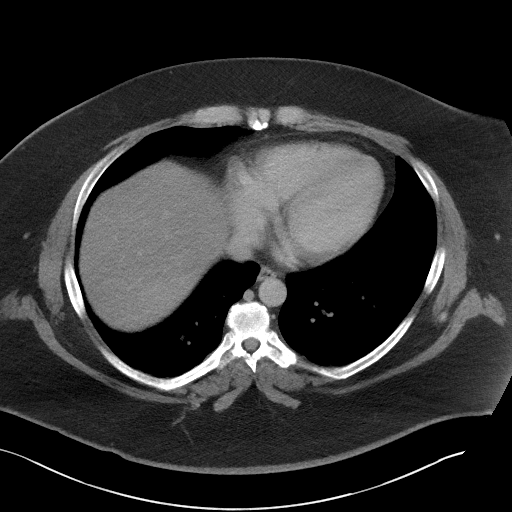

[Series 5: coronal st · coronal · 0.77mm/px · 3 of 106 slices shown]
[im 36/106  soft-tissue]
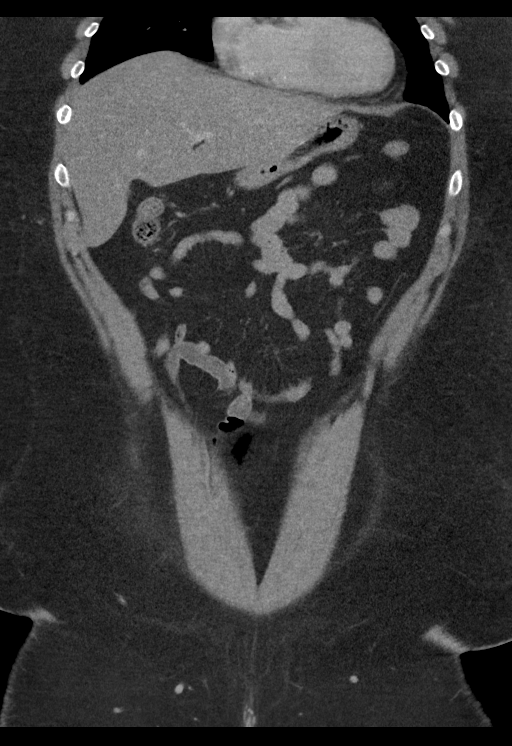
[im 47/106  soft-tissue]
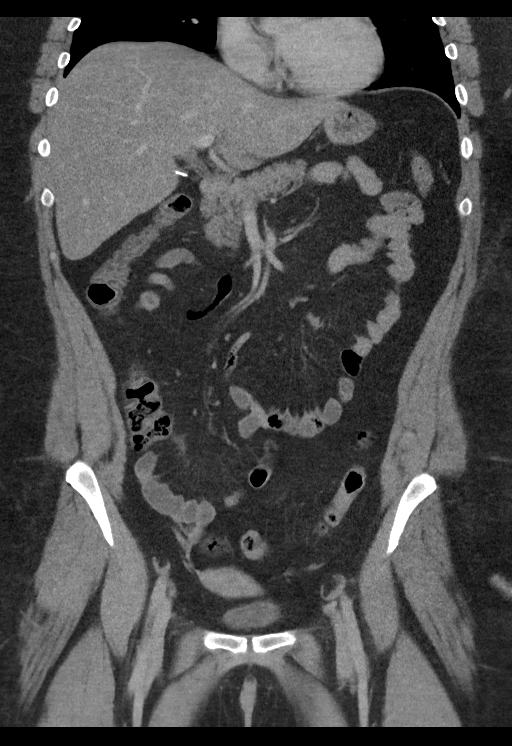
[im 59/106  soft-tissue]
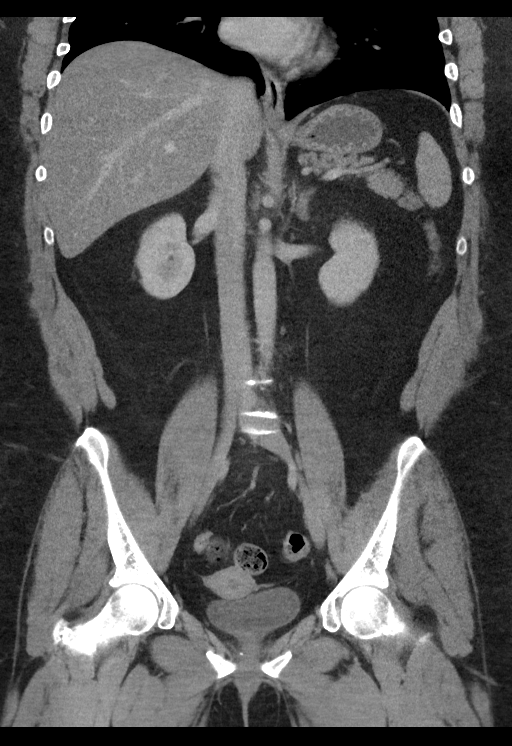

[16 of 46 positions shown; findings below may reference images not displayed]

FINDINGS: Lower chest: No acute abnormality.

Hepatobiliary: No focal liver abnormality is seen. Diffuse fatty
infiltration of the liver parenchyma is noted. Status post
cholecystectomy. No biliary dilatation.

Pancreas: Unremarkable. No pancreatic ductal dilatation or
surrounding inflammatory changes.

Spleen: Normal in size without focal abnormality.

Adrenals/Urinary Tract: Adrenal glands are unremarkable. Kidneys are
normal, without renal calculi, focal lesion, or hydronephrosis.
Bladder is unremarkable.

Stomach/Bowel: Stomach is within normal limits. Appendix appears
normal. No evidence of bowel wall thickening, distention, or
inflammatory changes.

Vascular/Lymphatic: No significant vascular findings are present. No
enlarged abdominal or pelvic lymph nodes.

Reproductive: Uterus and bilateral adnexa are unremarkable.

Other: No abdominal wall hernia or abnormality. No abdominopelvic
ascites.

Musculoskeletal: No acute or significant osseous findings.
IMPRESSION: 1. Diffuse fatty infiltration of the liver.
2. Evidence of prior cholecystectomy.

## 2021-08-06 ENCOUNTER — Telehealth (INDEPENDENT_AMBULATORY_CARE_PROVIDER_SITE_OTHER): Payer: No Payment, Other | Admitting: Psychiatry

## 2021-08-06 DIAGNOSIS — F411 Generalized anxiety disorder: Secondary | ICD-10-CM

## 2021-08-06 DIAGNOSIS — F330 Major depressive disorder, recurrent, mild: Secondary | ICD-10-CM | POA: Diagnosis not present

## 2021-08-06 MED ORDER — TRAZODONE HCL 100 MG PO TABS: 100.0000 mg | ORAL_TABLET | Freq: Every evening | ORAL | 2 refills | Status: DC | PRN

## 2021-08-06 MED ORDER — DULOXETINE HCL 40 MG PO CPEP: 80.0000 mg | ORAL_CAPSULE | Freq: Every day | ORAL | 2 refills | Status: DC

## 2021-08-06 MED ORDER — HYDROXYZINE HCL 25 MG PO TABS: 25.0000 mg | ORAL_TABLET | Freq: Every day | ORAL | 2 refills | Status: DC

## 2021-08-06 NOTE — Progress Notes (Signed)
BH MD/PA/Catherine Koch OP Progress Note  08/06/2021 5:34 PM Catherine Koch  MRN:  [id number]  Virtual Visit via Video Note  I connected with Catherine Koch on 08/06/21 at  5:30 PM EDT by a video enabled telemedicine application and verified that I am speaking with the correct person using two identifiers.  Location: Patient: home  Provider: offsite   I discussed the limitations of evaluation and management by telemedicine and the availability of in person appointments. The patient expressed understanding and agreed to proceed.   I discussed the assessment and treatment plan with the patient. The patient was provided an opportunity to ask questions and all were answered. The patient agreed with the plan and demonstrated an understanding of the instructions.   The patient was advised to call back or seek an in-person evaluation if the symptoms worsen or if the condition fails to improve as anticipated.  I provided 10 minutes of non-face-to-face time during this encounter.   Catherine Koch, Catherine Koch   Chief Complaint: Medication management  HPI: Catherine Koch is a 46 year old female presenting to [facility] behavioral health outpatient.  She has a psychiatric history of major depressive disorder and generalized anxiety disorder.  Her symptoms are managed with Cymbalta 80 mg daily, hydroxyzine 25 mg daily at bedtime, and trazodone 100 to 150 mg at bedtime as needed for sleep.  Patient reports medication noncompliance because she could not get her medications on time.  Patient reports being back on her medications for the last 3 days.  Today she reports a depressed mood and understands that it will take a few days to get the medications back into her system to improve her mood.  She denies need for dose adjustment today. Patient is alert and oriented x4, calm, pleasant and willing to engage.  She is well-groomed and dressed appropriately for the weather.  She denies suicidal or homicidal ideations,  paranoia, delusional thought, auditory or visual hallucinations.  Visit Diagnosis:    ICD-10-CM   1. Mild episode of recurrent major depressive disorder (HCC)  F33.0 DULoxetine HCl 40 MG CPEP    traZODone (DESYREL) 100 MG tablet    2. Generalized anxiety disorder  F41.1 hydrOXYzine (ATARAX) 25 MG tablet      Past Psychiatric History: see below  Past Medical History:  Past Medical History:  Diagnosis Date   Anxiety    Arthritis    pt. states in knees   Chronic gastritis    Chronic sinus infection    Closed fracture of right distal fibula 11/19/2013   Complication of anesthesia    pt. states difficult to wake up   Depression    GERD (gastroesophageal reflux disease)    Headache    pt. states random migraines   Hemorrhoids    High cholesterol    History of bronchitis    History of degenerative disc disease    Hypertension    Ovarian failure    PTSD (post-traumatic stress disorder)    Shortness of breath    exertion from crutches   Sleep apnea    cpap   UTI (lower urinary tract infection)    Wears glasses     Past Surgical History:  Procedure Laterality Date   BIOPSY  12/10/2019   Procedure: BIOPSY;  Surgeon: Napoleon Form, MD;  Location: [facility];  Service: Endoscopy;;   CHOLECYSTECTOMY  2010   CHOLECYSTECTOMY     COLONOSCOPY WITH PROPOFOL N/A 12/10/2019   Procedure: COLONOSCOPY WITH PROPOFOL;  Surgeon: Marsa Aris  V, MD;  Location: [facility];  Service: Endoscopy;  Laterality: N/A;   ESOPHAGOGASTRODUODENOSCOPY     ESOPHAGOGASTRODUODENOSCOPY (EGD) WITH PROPOFOL N/A 12/10/2019   Procedure: ESOPHAGOGASTRODUODENOSCOPY (EGD) WITH PROPOFOL;  Surgeon: Napoleon Form, MD;  Location: [facility];  Service: Endoscopy;  Laterality: N/A;   NASAL SEPTOPLASTY W/ TURBINOPLASTY Bilateral 12/11/2014   Procedure: NASAL SEPTOPLASTY AND BILATERAL INFERIOR TURBINATE REDUCTION;  Surgeon: Osborn Coho, MD;  Location: [facility] OR;  Service: ENT;  Laterality: Bilateral;    ORIF FIBULA FRACTURE Right 11/19/2013   Procedure: OPEN REDUCTION INTERNAL FIXATION (ORIF) RIGHT FIBULA FRACTURE;  Surgeon: Eulas Post, MD;  Location: [facility] OR;  Service: Orthopedics;  Laterality: Right;   WISDOM TOOTH EXTRACTION      Family Psychiatric History: none  Family History:  Family History  Problem Relation Age of Onset   Allergies Sister    Heart disease Paternal Grandmother    Cancer Paternal Grandmother        BREAST CANCER   Cancer Paternal Grandfather     Social History:  Social History   Socioeconomic History   Marital status: Married    Spouse name: Not on file   Number of children: Not on file   Years of education: Not on file   Highest education level: Not on file  Occupational History    Comment: UNEMPOLYEED   Tobacco Use   Smoking status: Former    Types: Cigarettes    Quit date: 05/24/2010    Years since quitting: 11.2    Passive exposure: Never   Smokeless tobacco: Never   Tobacco comments:    Pt. states she used to be a social smoker  Vaping Use   Vaping Use: Never used  Substance and Sexual Activity   Alcohol use: Not Currently   Drug use: No   Sexual activity: Yes    Birth control/protection: None  Other Topics Concern   Not on file  Social History Narrative   Not on file   Social Determinants of Health   Financial Resource Strain: Not on file  Food Insecurity: Not on file  Transportation Needs: Not on file  Physical Activity: Not on file  Stress: Not on file  Social Connections: Not on file    Allergies:  Allergies  Allergen Reactions   Sulfa Antibiotics Anaphylaxis, Swelling and Rash    Throat swelling/ rashes    Metabolic Disorder Labs: No results found for: HGBA1C, MPG No results found for: PROLACTIN No results found for: CHOL, TRIG, HDL, CHOLHDL, VLDL, LDLCALC Lab Results  Component Value Date   TSH 1.90 04/11/2012    Therapeutic Level Labs: No results found for: LITHIUM No results found for: VALPROATE No  components found for:  CBMZ  Current Medications: Current Outpatient Medications  Medication Sig Dispense Refill   acetaminophen (TYLENOL) 500 MG tablet Take 1,000 mg by mouth every 6 (six) hours as needed for fever.     diclofenac (VOLTAREN) 75 MG EC tablet Take 75 mg by mouth 2 (two) times daily.     DULoxetine HCl 40 MG CPEP Take 80 mg by mouth daily. 60 capsule 2   hydrOXYzine (ATARAX) 25 MG tablet Take 1 tablet (25 mg total) by mouth at bedtime. 30 tablet 2   metFORMIN (GLUMETZA) 500 MG (MOD) 24 hr tablet Take 500 mg by mouth daily with breakfast.     metoprolol tartrate (LOPRESSOR) 25 MG tablet Take 25 mg by mouth 2 (two) times daily.     misoprostol (CYTOTEC) 200 MCG tablet  Take 200 mcg by mouth 2 (two) times daily as needed (stomach ulcers).     omeprazole (PRILOSEC) 40 MG capsule TAKE 1 CAPSULE BY MOUTH DAILY 90 capsule 3   ondansetron (ZOFRAN ODT) 4 MG disintegrating tablet Take 1 tablet (4 mg total) by mouth every 8 (eight) hours as needed for nausea or vomiting. 8 tablet 0   spironolactone (ALDACTONE) 25 MG tablet Take 25 mg by mouth daily.     traZODone (DESYREL) 100 MG tablet Take 1-1.5 tablets (100-150 mg total) by mouth at bedtime as needed for sleep. 45 tablet 2   Turmeric (QC TUMERIC COMPLEX PO) Take 1 tablet by mouth daily.     No current facility-administered medications for this visit.     Musculoskeletal: Strength & Muscle Tone:  n/a Gait & Station:  n/a Patient leans: N/A  Psychiatric Specialty Exam: Review of Systems  Psychiatric/Behavioral:  Negative for hallucinations, self-injury and suicidal ideas.   All other systems reviewed and are negative.  Last menstrual period 06/29/2013.There is no height or weight on file to calculate BMI.  General Appearance: Well Groomed  Eye Contact:  Good  Speech:  Clear and Coherent  Volume:  Normal  Mood:  depressed  Affect:  Congruent  Thought Process:  Goal Directed  Orientation:  Full (Time, Place, and Person)   Thought Content: Logical   Suicidal Thoughts:  No  Homicidal Thoughts:  No  Memory:  Immediate;   Good  Judgement:  Good  Insight:  Good  Psychomotor Activity:  Normal  Concentration:  Concentration: Good  Recall:  Good  Fund of Knowledge: Good  Language: Good  Akathisia:  NA  Handed:  Right  AIMS (if indicated): not done  Assets:  Communication Skills Desire for Improvement  ADL's:  Intact  Cognition: WNL  Sleep:  Good   Screenings: GAD-7    Flowsheet Row Video Visit from 05/22/2021 in [facility] Video Visit from 02/19/2021 in [facility] Video Visit from 11/20/2020 in [facility] Visit from 08/26/2020 [facility] Video Visit from 05/28/2020 in [facility]  Total GAD-7 Score 13 10 7 15 9       PHQ2-9    Flowsheet Row Video Visit from 05/22/2021 in [facility] Video Visit from 02/19/2021 in [facility] Video Visit from 11/20/2020 in [facility] Video Visit from 08/26/2020 in [facility] Video Visit from 05/28/2020 in [facility]  PHQ-2 Total Score 2 4 2 4 2   PHQ-9 Total Score 12 15 8 17 13       Flowsheet Row Video Visit from 11/20/2020 in [facility] ED from 11/11/2020 in [facility] Video Visit from 08/26/2020 in [facility]  C-SSRS RISK CATEGORY No Risk No Risk No Risk        Assessment and Plan: Damyria Hoosier is a 46 year old female presenting to [facility] behavioral health outpatient.  She has a psychiatric history of major depressive disorder and generalized anxiety disorder.  Her symptoms are managed with Cymbalta 80 mg daily, hydroxyzine 25 mg daily at bedtime, and trazodone 100 to 150 mg  at bedtime as needed for sleep.  Patient reports medication noncompliance because she could not get her medications on time.  Patient reports being back on her medications for the last 3 days.  Today  she reports a depressed mood and understands that it will take some time for the medications to build to a therapeutic level to improve her mood.  She denies need for dose adjustment today.  No medication changes today.  Medications refilled at previous dosage.  Collaboration of Care: Collaboration of Care: Medication Management AEB medications E scribed to patient's preferred pharmacy.  Patient/Guardian was advised Release of Information must be obtained prior to any record release in order to collaborate their care with an outside provider. Patient/Guardian was advised if they have not already done so to contact the registration department to sign all necessary forms in order for [place] to release information regarding their care.   1. Mild episode of recurrent major depressive disorder (HCC)  - DULoxetine HCl 40 MG CPEP; Take 80 mg by mouth daily.  Dispense: 60 capsule; Refill: 2 - traZODone (DESYREL) 100 MG tablet; Take 1-1.5 tablets (100-150 mg total) by mouth at bedtime as needed for sleep.  Dispense: 45 tablet; Refill: 2  2. Generalized anxiety disorder  - hydrOXYzine (ATARAX) 25 MG tablet; Take 1 tablet (25 mg total) by mouth at bedtime.  Dispense: 30 tablet; Refill: 2   Return to care in 3 months  Consent: Patient/Guardian gives verbal consent for treatment and assignment of benefits for services provided during this visit. Patient/Guardian expressed understanding and agreed to proceed.    Catherine Koch, Catherine Koch 08/06/2021, 5:34 PM

## 2021-08-07 ENCOUNTER — Ambulatory Visit (INDEPENDENT_AMBULATORY_CARE_PROVIDER_SITE_OTHER): Payer: No Payment, Other | Admitting: Licensed Clinical Social Worker

## 2021-08-07 ENCOUNTER — Telehealth (HOSPITAL_COMMUNITY): Payer: No Payment, Other | Admitting: Psychiatry

## 2021-08-07 DIAGNOSIS — F331 Major depressive disorder, recurrent, moderate: Secondary | ICD-10-CM | POA: Diagnosis not present

## 2021-08-07 NOTE — Progress Notes (Signed)
? ?  THERAPIST PROGRESS NOTE ? ? ?Virtual Visit via Video Note ? ?I connected with Catherine Koch on 08/07/21 at 11:00 AM EDT by a video enabled telemedicine application and verified that I am speaking with the correct person using two identifiers. ? ?Location: ?Patient: Home ?Provider: Home ?  ?I discussed the limitations of evaluation and management by telemedicine and the availability of in person appointments. The patient expressed understanding and agreed to proceed. ?I discussed the assessment and treatment plan with the patient. The patient was provided an opportunity to ask questions and all were answered. The patient agreed with the plan and demonstrated an understanding of the instructions. ?  ?The patient was advised to call back or seek an in-person evaluation if the symptoms worsen or if the condition fails to improve as anticipated. ? ?I provided 22 minutes of non-face-to-face time during this encounter. ? ?Participation Level: Active ? ?Behavioral Response: CasualAlertDepressed ? ?Type of Therapy: Individual Therapy ? ?Treatment Goals addressed: dep/stressors/coping ? ?ProgressTowards Goals: Progressing minimally d/t poor follow through ? ?Interventions: Supportive and Other: Termination ? ?Summary: Catherine Koch is a 46 y.o. female who presents with hx of dep/anx. Today pt logs on for video session per schedule. Pt reports her depression has been more symptomatic over recent weeks after she was off her medication for ~ a wk. She states she has been more irritable, tearful, easily overwhelmed. Gionni advises she failed to call her medication, which is mail ordered, in timely so she went without as it takes extra time going through med assist program. She agrees to set a reminder on her phone going forward. She states symptoms are already improving since she is currently back on meds. LCSW assessed for pt taking over the finances for the household. Pt reports she decided they would work on fiances  together so her husband could learn how to manage finances better. She feels this approach is going well so far. She states they did ask her parents for financial aid again and house payment is currently caught up. [name] advises she and spouse are trying to spend based on "needs not wants". She states her disability claim was denied when asked. She advises attorneys are already working on an appeal. Pt vocal about disappointment r/t to denial despite knowing it is common. LCSW assessed for pt's best friend coming to visit as planned. [name] states this had to be postponed to April d/t friend's work demands. LCSW informed pt of this clinician's resignation from position with [facility]. LCSW assisted pt to process thoughts/feelings r/t change in care. LCSW provided information on transition plan with pt verbalizing understanding. Pt aware this is final session with this LCSW. Pt states appreciation for care. ? ?Suicidal/Homicidal: Nowithout intent/plan ? ?Therapist Response: Pt somewhat receptive to care. ? ?Plan: Return again for next avail appt with new clinician as this LCSW has resigned. ? ?Diagnosis: MDD, recurrent, moderate ? ?Collaboration of Care: Other None deemed necessary this session. ? ?Patient/Guardian was advised Release of Information must be obtained prior to any record release in order to collaborate their care with an outside provider. Patient/Guardian was advised if they have not already done so to contact the registration department to sign all necessary forms in order for [place] to release information regarding their care.  ? ?Consent: Patient/Guardian gives verbal consent for treatment and assignment of benefits for services provided during this visit. Patient/Guardian expressed understanding and agreed to proceed.  ? ?Hermine Messick, LCSW ?08/07/2021 ? ?

## 2021-09-11 ENCOUNTER — Ambulatory Visit (HOSPITAL_COMMUNITY): Payer: No Payment, Other | Admitting: Licensed Clinical Social Worker

## 2021-10-02 ENCOUNTER — Emergency Department (HOSPITAL_BASED_OUTPATIENT_CLINIC_OR_DEPARTMENT_OTHER)
Admission: EM | Admit: 2021-10-02 | Discharge: 2021-10-02 | Disposition: A | Discharge status: Home / Self Care | Payer: Medicaid Other | Attending: Emergency Medicine | Admitting: Emergency Medicine

## 2021-10-02 ENCOUNTER — Encounter (HOSPITAL_BASED_OUTPATIENT_CLINIC_OR_DEPARTMENT_OTHER): Payer: Self-pay

## 2021-10-02 ENCOUNTER — Other Ambulatory Visit: Payer: Self-pay

## 2021-10-02 DIAGNOSIS — Z79899 Other long term (current) drug therapy: Secondary | ICD-10-CM | POA: Insufficient documentation

## 2021-10-02 DIAGNOSIS — I10 Essential (primary) hypertension: Secondary | ICD-10-CM | POA: Insufficient documentation

## 2021-10-02 DIAGNOSIS — M546 Pain in thoracic spine: Secondary | ICD-10-CM | POA: Insufficient documentation

## 2021-10-02 DIAGNOSIS — M545 Low back pain, unspecified: Secondary | ICD-10-CM | POA: Insufficient documentation

## 2021-10-02 DIAGNOSIS — N390 Urinary tract infection, site not specified: Secondary | ICD-10-CM

## 2021-10-02 DIAGNOSIS — M62838 Other muscle spasm: Secondary | ICD-10-CM | POA: Insufficient documentation

## 2021-10-02 LAB — URINALYSIS, ROUTINE W REFLEX MICROSCOPIC
Bilirubin Urine: NEGATIVE
Glucose, UA: NEGATIVE mg/dL
Hgb urine dipstick: NEGATIVE
Ketones, ur: NEGATIVE mg/dL
Nitrite: NEGATIVE
Protein, ur: 30 mg/dL — AB
Specific Gravity, Urine: 1.03 (ref 1.005–1.030)
WBC, UA: >50 WBC/hpf — ABNORMAL HIGH (ref 0–5)
pH: 6 (ref 5.0–8.0)

## 2021-10-02 LAB — PREGNANCY, URINE: Preg Test, Ur: NEGATIVE

## 2021-10-02 MED ORDER — KETOROLAC TROMETHAMINE 60 MG/2ML IM SOLN
30.0000 mg | Freq: Once | INTRAMUSCULAR | Status: AC
Start: 2021-10-02 — End: 2021-10-02
  Administered 2021-10-02: 30 mg via INTRAMUSCULAR
  Filled 2021-10-02: qty 2

## 2021-10-02 MED ORDER — CEPHALEXIN 500 MG PO CAPS: 500.0000 mg | ORAL_CAPSULE | Freq: Four times a day (QID) | ORAL | 0 refills | Status: DC

## 2021-10-02 MED ORDER — METHOCARBAMOL 500 MG PO TABS: 1000.0000 mg | ORAL_TABLET | Freq: Two times a day (BID) | ORAL | 0 refills | Status: DC

## 2021-10-02 MED ORDER — METHOCARBAMOL 500 MG PO TABS
1000.0000 mg | ORAL_TABLET | Freq: Once | ORAL | Status: AC
  Administered 2021-10-02: 1000 mg via ORAL
  Filled 2021-10-02: qty 2

## 2021-10-02 NOTE — ED Triage Notes (Signed)
Pt states that she has been having lower back pain since Sun that has been getting worse. Reports that she had a fever at home the other day, denies urinary symptoms.  ?

## 2021-10-02 NOTE — ED Provider Notes (Signed)
?[facility]PT ?Provider Note ? ?CSN: [id number] ?Arrival date & time: 10/02/21 [id number] ? ?Chief Complaint(s) ?Back Pain ? ?HPI ?Catherine Koch is a 46 y.o. female   ? ? ?Back Pain ?Location:  Lumbar spine and sacro-iliac joint ?Quality:  Aching, stabbing and stiffness ?Stiffness is present:  All day ?Pain severity:  Moderate ?Onset quality:  Gradual ?Duration:  1 week ?Timing:  Constant ?Progression:  Waxing and waning ?Chronicity:  Recurrent ?Context: twisting   ?Context: not falling, not lifting heavy objects, not MVA, not occupational injury, not recent illness and not recent injury   ?Relieved by:  Being still ?Worsened by:  Movement, touching, palpation and bending ?Associated symptoms: no abdominal pain, no bladder incontinence, no bowel incontinence, no dysuria, no fever, no paresthesias and no perianal numbness   ? ?Past Medical History ?Past Medical History:  ?Diagnosis Date  ? Anxiety   ? Arthritis   ? pt. states in knees  ? Chronic gastritis   ? Chronic sinus infection   ? Closed fracture of right distal fibula 11/19/2013  ? Complication of anesthesia   ? pt. states difficult to wake up  ? Depression   ? GERD (gastroesophageal reflux disease)   ? Headache   ? pt. states random migraines  ? Hemorrhoids   ? High cholesterol   ? History of bronchitis   ? History of degenerative disc disease   ? Hypertension   ? Ovarian failure   ? PTSD (post-traumatic stress disorder)   ? Shortness of breath   ? exertion from crutches  ? Sleep apnea   ? cpap  ? UTI (lower urinary tract infection)   ? Wears glasses   ? ?Patient Active Problem List  ? Diagnosis Date Noted  ? Generalized anxiety disorder 08/26/2020  ? Major depressive disorder, recurrent episode, moderate (HCC) 08/26/2020  ? Mild episode of recurrent major depressive disorder (HCC) 02/26/2020  ? LLQ abdominal pain   ? Rectal bleeding   ? Gastritis and gastroduodenitis   ? Nausea without vomiting   ? LUQ abdominal pain   ? Deviated nasal  septum 12/11/2014  ?  Class: Chronic  ? Closed fracture of right distal fibula 11/19/2013  ? S/P ORIF (open reduction internal fixation) fracture 11/19/2013  ? Obesity, morbid (HCC) 06/17/2013  ? Chronic rhinitis 06/17/2013  ? Acute maxillary sinusitis 06/03/2012  ? Obstructive sleep apnea 01/15/2012  ? Depression 01/15/2012  ? ?Home Medication(s) ?Prior to Admission medications   ?Medication Sig Start Date End Date Taking? Authorizing Provider  ?methocarbamol (ROBAXIN) 500 MG tablet Take 2 tablets (1,000 mg total) by mouth 2 (two) times daily. 10/02/21  Yes Jemario Poitras, Amadeo GarnetPedro Eduardo, MD  ?acetaminophen (TYLENOL) 500 MG tablet Take 1,000 mg by mouth every 6 (six) hours as needed for fever.    [provider]  ?diclofenac (VOLTAREN) 75 MG EC tablet Take 75 mg by mouth 2 (two) times daily.    [provider]  ?DULoxetine HCl 40 MG CPEP Take 80 mg by mouth daily. 08/06/21   Penn, Cranston Neighboricely, NP  ?hydrOXYzine (ATARAX) 25 MG tablet Take 1 tablet (25 mg total) by mouth at bedtime. 08/06/21   Penn, Cranston Neighboricely, NP  ?metFORMIN (GLUMETZA) 500 MG (MOD) 24 hr tablet Take 500 mg by mouth daily with breakfast.    [provider]  ?metoprolol tartrate (LOPRESSOR) 25 MG tablet Take 25 mg by mouth 2 (two) times daily.    [provider]  ?misoprostol (CYTOTEC) 200 MCG tablet Take 200 mcg by  mouth 2 (two) times daily as needed (stomach ulcers).    [provider]  ?omeprazole (PRILOSEC) 40 MG capsule TAKE 1 CAPSULE BY MOUTH DAILY 02/13/21   Napoleon Form, MD  ?ondansetron (ZOFRAN ODT) 4 MG disintegrating tablet Take 1 tablet (4 mg total) by mouth every 8 (eight) hours as needed for nausea or vomiting. 11/11/20   Petrucelli, Samantha R, PA-C  ?spironolactone (ALDACTONE) 25 MG tablet Take 25 mg by mouth daily.    [provider]  ?traZODone (DESYREL) 100 MG tablet Take 1-1.5 tablets (100-150 mg total) by mouth at bedtime as needed for sleep. 08/06/21   Mcneil Sober, NP  ?Turmeric (QC TUMERIC  COMPLEX PO) Take 1 tablet by mouth daily.    [provider]  ?ranitidine (ZANTAC) 150 MG tablet Take 1 tablet (150 mg total) by mouth 2 (two) times daily. 10/25/17 08/04/19  Muthersbaugh, Dahlia Client, PA-C  ?                                                                                                                                  ?Allergies ?Sulfa antibiotics ? ?Review of Systems ?Review of Systems  ?Constitutional:  Negative for fever.  ?Gastrointestinal:  Negative for abdominal pain and bowel incontinence.  ?Genitourinary:  Negative for bladder incontinence and dysuria.  ?Musculoskeletal:  Positive for back pain.  ?Neurological:  Negative for paresthesias.  ?As noted in HPI ? ?Physical Exam ?Vital Signs  ?I have reviewed the triage vital signs ?BP (!) 155/116   Pulse 86   Temp 98.6 ?F (37 ?C) (Oral)   Resp 18   LMP 06/29/2013 Comment: waiver signed  SpO2 97%  ? ?Physical Exam ?Vitals reviewed.  ?Constitutional:   ?   General: She is not in acute distress. ?   Appearance: She is well-developed. She is obese. She is not diaphoretic.  ?HENT:  ?   Head: Normocephalic and atraumatic.  ?   Right Ear: External ear normal.  ?   Left Ear: External ear normal.  ?   Nose: Nose normal.  ?Eyes:  ?   General: No scleral icterus. ?   Conjunctiva/sclera: Conjunctivae normal.  ?Neck:  ?   Trachea: Phonation normal.  ?Cardiovascular:  ?   Rate and Rhythm: Normal rate and regular rhythm.  ?Pulmonary:  ?   Effort: Pulmonary effort is normal. No respiratory distress.  ?   Breath sounds: No stridor.  ?Abdominal:  ?   General: There is no distension.  ?Musculoskeletal:     ?   General: Normal range of motion.  ?   Cervical back: Normal range of motion.  ?   Thoracic back: Spasms and tenderness present.  ?   Lumbar back: Spasms and tenderness present. No bony tenderness.  ?     Back: ? ?Neurological:  ?   Mental Status: She is alert and oriented to person, place, and time.  ?   Comments: Spine Exam: ?  Strength: 5/5 throughout  LE bilaterally  ?Sensation: Intact to light touch in proximal and distal LE bilaterally ? ?  ?Psychiatric:     ?   Behavior: Behavior normal.  ? ? ?ED Results and Treatments ?Labs ?(all labs ordered are listed, but only abnormal results are displayed) ?Labs Reviewed  ?URINALYSIS, ROUTINE W REFLEX MICROSCOPIC  ?PREGNANCY, URINE  ?                                                                                                                       ?EKG ? EKG Interpretation ? ?Date/Time:    ?Ventricular Rate:    ?PR Interval:    ?QRS Duration:   ?QT Interval:    ?QTC Calculation:   ?R Axis:     ?Text Interpretation:   ?  ? ?  ? ?Radiology ?No results found. ? ?Pertinent labs & imaging results that were available during my care of the patient were reviewed by me and considered in my medical decision making (see MDM for details). ? ?Medications Ordered in ED ?Medications  ?ketorolac (TORADOL) injection 30 mg (30 mg Intramuscular Given 10/02/21 0649)  ?methocarbamol (ROBAXIN) tablet 1,000 mg (1,000 mg Oral Given 10/02/21 0649)  ?                                                               ?                                                                    ?Procedures ?Procedures ? ?(including critical care time) ? ?Medical Decision Making / ED Course ? ? ? Complexity of Problem: ? ?Patient's presenting problem/concern, DDX, and MDM listed below: ?Lower back pain ?Most suspicious for muscle strain/spasm. ?Patient denies any trauma or falls concerning for bony injury. ?No red flags concerning for cord compression. ?We will assess for UTI. ?We will rule out pregnancy related process. ? ?Hospitalization Considered:  ?No ? ?Initial Intervention:  ?IM Toradol, PO robaxin ? ?  ?ED Course:   ? ?Assessment, Add'l Intervention, and Reassessment: ?Lower back pain ?Pending UA and UPT. IF negative, DC with robaxin Rx.  ?Patient care turned over to oncoming provider. Patient case and results discussed in detail; please see their note  for further ED managment.  ? ? ? ?Final Clinical Impression(s) / ED Diagnoses ?Final diagnoses:  ?Acute bilateral low back pain without sciatica  ? ? ?  ? ? ? ? ? ?This chart was dictated using voice recog

## 2021-10-02 NOTE — ED Provider Notes (Signed)
Signout from Dr. Eudelia Bunch.  46 year old female with back pain for 1 week no trauma.  Plan is to follow-up on urinalysis and treat if necessary.  Otherwise symptomatic treatment. ?Physical Exam  ?BP (!) 155/116   Pulse 86   Temp 98.6 ?F (37 ?C) (Oral)   Resp 18   LMP 06/29/2013 Comment: waiver signed  SpO2 97%  ? ?Physical Exam ? ?Procedures  ?Procedures ? ?ED Course / MDM  ?  ?Medical Decision Making ?Amount and/or Complexity of Data Reviewed ?Labs: ordered. ? ?Risk ?Prescription drug management. ? ? ?Urinalysis concerning for infection.  Last urine culture showed mixed species.  Allergic to sulfa.  Put on Keflex and recommend close follow-up with PCP.  Urine culture ordered.  Return instructions discussed. ? ? ? ? ?  ?Terrilee Files, MD ?10/02/21 [phone] ? ?

## 2021-10-02 NOTE — Discharge Instructions (Addendum)
You may continue to take your Diclofenac for pain. Additionally, you can use over the counter Acetaminophen (Tylenol), topical muscle creams such as SalonPas, [employer], Bengay, etc. Please stretch, apply ice or heat (whichever helps), and have massage therapy for additional assistance.  We are also starting you on some antibiotics for possible urinary tract infection.  Please drink plenty of fluids.  Finish your antibiotics.  Follow-up with your primary care doctor.  Return if any worsening or concerning symptoms ? ?

## 2021-10-03 LAB — URINE CULTURE

## 2021-10-05 ENCOUNTER — Ambulatory Visit (INDEPENDENT_AMBULATORY_CARE_PROVIDER_SITE_OTHER): Payer: No Payment, Other | Admitting: Licensed Clinical Social Worker

## 2021-10-05 DIAGNOSIS — F0632 Mood disorder due to known physiological condition with major depressive-like episode: Secondary | ICD-10-CM | POA: Insufficient documentation

## 2021-10-05 DIAGNOSIS — F411 Generalized anxiety disorder: Secondary | ICD-10-CM
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
## 2021-10-06 ENCOUNTER — Ambulatory Visit (INDEPENDENT_AMBULATORY_CARE_PROVIDER_SITE_OTHER): Payer: No Payment, Other | Admitting: Licensed Clinical Social Worker

## 2021-10-06 ENCOUNTER — Encounter (HOSPITAL_COMMUNITY): Payer: Self-pay

## 2021-10-06 DIAGNOSIS — F411 Generalized anxiety disorder: Secondary | ICD-10-CM

## 2021-10-06 DIAGNOSIS — F32A Depression, unspecified: Secondary | ICD-10-CM | POA: Diagnosis not present

## 2021-10-06 DIAGNOSIS — F0632 Mood disorder due to known physiological condition with major depressive-like episode: Secondary | ICD-10-CM
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
## 2021-10-06 NOTE — Plan of Care (Signed)
?  Problem: Depression CCP Problem  1 Learn and Apply Coping Skills to Decrease Depressive Symptoms   ?Goal: LTG: Catherine Koch WILL SCORE LESS THAN 10 ON THE PATIENT HEALTH QUESTIONNAIRE (PHQ-9) ?Outcome: Not Applicable ?Goal: STG: Catherine Koch WILL ATTEND AT LEAST 80% OF SCHEDULED PHP SESSIONS ?Outcome: Not Applicable ?Goal: STG: Catherine Koch WILL ATTEND AT LEAST 80% OF SCHEDULED GROUP PSYCHOTHERAPY SESSIONS ?Outcome: Not Applicable ?Goal: STG: Catherine Koch WILL COMPLETE AT LEAST 80% OF ASSIGNED HOMEWORK ?Outcome: Not Applicable ?Goal: STG: Catherine Koch WILL IDENTIFY AT LEAST 3 COGNITIVE PATTERNS AND BELIEFS THAT SUPPORT DEPRESSION ?Outcome: Not Applicable ?  ?

## 2021-10-12 ENCOUNTER — Ambulatory Visit (INDEPENDENT_AMBULATORY_CARE_PROVIDER_SITE_OTHER): Payer: No Payment, Other | Admitting: Licensed Clinical Social Worker

## 2021-10-12 DIAGNOSIS — F411 Generalized anxiety disorder: Secondary | ICD-10-CM

## 2021-10-12 DIAGNOSIS — F0632 Mood disorder due to known physiological condition with major depressive-like episode: Secondary | ICD-10-CM | POA: Diagnosis not present

## 2021-10-13 ENCOUNTER — Encounter (HOSPITAL_COMMUNITY): Payer: Self-pay

## 2021-10-13 ENCOUNTER — Ambulatory Visit (INDEPENDENT_AMBULATORY_CARE_PROVIDER_SITE_OTHER): Payer: No Payment, Other | Admitting: Licensed Clinical Social Worker

## 2021-10-13 DIAGNOSIS — F411 Generalized anxiety disorder: Secondary | ICD-10-CM

## 2021-10-13 DIAGNOSIS — F0632 Mood disorder due to known physiological condition with major depressive-like episode: Secondary | ICD-10-CM
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
## 2021-10-14 ENCOUNTER — Ambulatory Visit (INDEPENDENT_AMBULATORY_CARE_PROVIDER_SITE_OTHER): Payer: No Payment, Other | Admitting: Licensed Clinical Social Worker

## 2021-10-14 DIAGNOSIS — F0632 Mood disorder due to known physiological condition with major depressive-like episode: Secondary | ICD-10-CM | POA: Diagnosis not present

## 2021-10-14 DIAGNOSIS — F411 Generalized anxiety disorder: Secondary | ICD-10-CM

## 2021-10-15 ENCOUNTER — Ambulatory Visit (INDEPENDENT_AMBULATORY_CARE_PROVIDER_SITE_OTHER): Payer: No Payment, Other | Admitting: Licensed Clinical Social Worker

## 2021-10-15 DIAGNOSIS — F411 Generalized anxiety disorder: Secondary | ICD-10-CM

## 2021-10-15 DIAGNOSIS — F0632 Mood disorder due to known physiological condition with major depressive-like episode: Secondary | ICD-10-CM

## 2021-10-16 ENCOUNTER — Ambulatory Visit (INDEPENDENT_AMBULATORY_CARE_PROVIDER_SITE_OTHER): Payer: No Payment, Other | Admitting: Licensed Clinical Social Worker

## 2021-10-16 DIAGNOSIS — F411 Generalized anxiety disorder: Secondary | ICD-10-CM

## 2021-10-16 DIAGNOSIS — F0632 Mood disorder due to known physiological condition with major depressive-like episode: Secondary | ICD-10-CM

## 2021-10-20 ENCOUNTER — Ambulatory Visit (INDEPENDENT_AMBULATORY_CARE_PROVIDER_SITE_OTHER): Payer: No Payment, Other | Admitting: Licensed Clinical Social Worker

## 2021-10-20 DIAGNOSIS — F0632 Mood disorder due to known physiological condition with major depressive-like episode: Secondary | ICD-10-CM | POA: Diagnosis not present

## 2021-10-20 NOTE — Progress Notes (Signed)
Virtual Visit via Video Note  I connected with [name] on 10/20/21 at  9:00 AM EDT by a video enabled telemedicine application and verified that I am speaking with the correct person using two identifiers.  Location: Patient: Home  Provider: Office   I discussed the limitations of evaluation and management by telemedicine and the availability of in person appointments. The patient expressed understanding and agreed to proceed.   I discussed the assessment and treatment plan with the patient. The patient was provided an opportunity to ask questions and all were answered. The patient agreed with the plan and demonstrated an understanding of the instructions.   The patient was advised to call back or seek an in-person evaluation if the symptoms worsen or if the condition fails to improve as anticipated.  I provided 15 minutes of non-face-to-face time during this encounter.   Derrill Center, NP   [facility] MD/PA/NP OP Progress Note  10/20/2021 11:52 AM NABA BUONOCORE  MRN:  [id number]  Chief Complaint: Grayci reported " just feeling tired."   HPI: Kathaleya reported " I am not a morning person."  Reports overall her mood has been fluctuating.  Continues to report multiple stressors related to financial and depression.  States I noticed things that need to be done however I just do not have any motivation.  She denied suicidal or homicidal ideations.  Denies auditory visual hallucinations.  States she has been taking her medications as indicated.  Denied any medication side effects.  Rates her depression 7 out of 10 with 10 being the worst.  Reports a good appetite.  States resting well throughout the night, " I slept all weekend."  She attributes her symptoms to her current depressive state.  We will continue to monitor for safety.  Support , encouragement and reassurance was provided.    Visit Diagnosis:    ICD-10-CM   1. Depressive disorder due to another medical condition with major  depressive-like episode  F06.32     2. Generalized anxiety disorder  F41.1       Past Psychiatric History:   Past Medical History:  Past Medical History:  Diagnosis Date   Anxiety    Arthritis    pt. states in knees   Chronic gastritis    Chronic sinus infection    Closed fracture of right distal fibula 0000000   Complication of anesthesia    pt. states difficult to wake up   Depression    GERD (gastroesophageal reflux disease)    Headache    pt. states random migraines   Hemorrhoids    High cholesterol    History of bronchitis    History of degenerative disc disease    Hypertension    Ovarian failure    PTSD (post-traumatic stress disorder)    Shortness of breath    exertion from crutches   Sleep apnea    cpap   UTI (lower urinary tract infection)    Wears glasses     Past Surgical History:  Procedure Laterality Date   BIOPSY  12/10/2019   Procedure: BIOPSY;  Surgeon: Mauri Pole, MD;  [facility]: [facility];  Service: Endoscopy;;   CHOLECYSTECTOMY  2010   CHOLECYSTECTOMY     COLONOSCOPY WITH PROPOFOL N/A 12/10/2019   Procedure: COLONOSCOPY WITH PROPOFOL;  Surgeon: Mauri Pole, MD;  [facility];  Service: Endoscopy;  Laterality: N/A;   ESOPHAGOGASTRODUODENOSCOPY     ESOPHAGOGASTRODUODENOSCOPY (EGD) WITH PROPOFOL N/A 12/10/2019   Procedure: ESOPHAGOGASTRODUODENOSCOPY (EGD)  WITH PROPOFOL;  Surgeon: Mauri Pole, MD;  [facility];  Service: Endoscopy;  Laterality: N/A;   NASAL SEPTOPLASTY W/ TURBINOPLASTY Bilateral 12/11/2014   Procedure: NASAL SEPTOPLASTY AND BILATERAL INFERIOR TURBINATE REDUCTION;  Surgeon: Jerrell Belfast, MD;  Location[facility] OR;  Service: ENT;  Laterality: Bilateral;   ORIF FIBULA FRACTURE Right 11/19/2013   Procedure: OPEN REDUCTION INTERNAL FIXATION (ORIF) RIGHT FIBULA FRACTURE;  Surgeon: Johnny Bridge, MD;  Location: [facility];  Service: Orthopedics;  Laterality: Right;   WISDOM TOOTH EXTRACTION       Family Psychiatric History:   Family History:  Family History  Problem Relation Age of Onset   Depression Sister    Allergies Sister    Cancer Paternal Grandfather    Heart disease Paternal Grandmother    Cancer Paternal Grandmother        BREAST CANCER    Social History:  Social History   Socioeconomic History   Marital status: Married    Spouse name: Not on file   Number of children: 0   Years of education: Not on file   Highest education level: Some college, no degree  Occupational History    Comment: UNEMPOLYEED   Tobacco Use   Smoking status: Former    Types: Cigarettes    Quit date: 05/24/2010    Years since quitting: 11.4    Passive exposure: Never   Smokeless tobacco: Never   Tobacco comments:    Pt. states she used to be a social smoker  Vaping Use   Vaping Use: Never used  Substance and Sexual Activity   Alcohol use: Not Currently   Drug use: No   Sexual activity: Yes    Birth control/protection: None  Other Topics Concern   Not on file  Social History Narrative   Not on file   Social Determinants of Health   Financial Resource Strain: Not on file  Food Insecurity: Not on file  Transportation Needs: Not on file  Physical Activity: Not on file  Stress: Not on file  Social Connections: Not on file    Allergies:  Allergies  Allergen Reactions   Sulfa Antibiotics Anaphylaxis, Swelling and Rash    Throat swelling/ rashes    Metabolic Disorder Labs: No results found for: HGBA1C, MPG No results found for: PROLACTIN No results found for: CHOL, TRIG, HDL, CHOLHDL, VLDL, LDLCALC Lab Results  Component Value Date   TSH 1.90 04/11/2012    Therapeutic Level Labs: No results found for: LITHIUM No results found for: VALPROATE No components found for:  CBMZ  Current Medications: Current Outpatient Medications  Medication Sig Dispense Refill   acetaminophen (TYLENOL) 500 MG tablet Take 1,000 mg by mouth every 6 (six) hours as needed for  fever.     cephALEXin (KEFLEX) 500 MG capsule Take 1 capsule (500 mg total) by mouth 4 (four) times daily. (Patient not taking: Reported on 10/13/2021) 28 capsule 0   diclofenac (VOLTAREN) 75 MG EC tablet Take 75 mg by mouth 2 (two) times daily.     DULoxetine HCl 40 MG CPEP Take 80 mg by mouth daily. 60 capsule 2   hydrOXYzine (ATARAX) 25 MG tablet Take 1 tablet (25 mg total) by mouth at bedtime. 30 tablet 2   levocetirizine (XYZAL) 5 MG tablet Take 5 mg by mouth every evening.     metFORMIN (GLUMETZA) 500 MG (MOD) 24 hr tablet Take 500 mg by mouth daily with breakfast.     methocarbamol (ROBAXIN) 500 MG tablet Take  2 tablets (1,000 mg total) by mouth 2 (two) times daily. 20 tablet 0   metoprolol tartrate (LOPRESSOR) 25 MG tablet Take 25 mg by mouth 2 (two) times daily.     misoprostol (CYTOTEC) 200 MCG tablet Take 200 mcg by mouth 2 (two) times daily as needed (stomach ulcers). (Patient not taking: Reported on 10/13/2021)     omeprazole (PRILOSEC) 40 MG capsule TAKE 1 CAPSULE BY MOUTH DAILY 90 capsule 3   ondansetron (ZOFRAN ODT) 4 MG disintegrating tablet Take 1 tablet (4 mg total) by mouth every 8 (eight) hours as needed for nausea or vomiting. 8 tablet 0   spironolactone (ALDACTONE) 25 MG tablet Take 25 mg by mouth daily.     traZODone (DESYREL) 100 MG tablet Take 1-1.5 tablets (100-150 mg total) by mouth at bedtime as needed for sleep. 45 tablet 2   Turmeric (QC TUMERIC COMPLEX PO) Take 1 tablet by mouth daily.     No current facility-administered medications for this visit.     Musculoskeletal: Strength & Muscle Tone: within normal limits Gait & Station: normal Patient leans: N/A  Psychiatric Specialty Exam: Review of Systems  Constitutional: Negative.   HENT: Negative.    Neurological: Negative.   Psychiatric/Behavioral:  Positive for decreased concentration and sleep disturbance.   All other systems reviewed and are negative.  Last menstrual period 06/29/2013.There is no height  or weight on file to calculate BMI.  General Appearance: Casual  Eye Contact:  Good  Speech:  Clear and Coherent  Volume:  Normal  Mood:  Anxious and Depressed  Affect:  Congruent  Thought Process:  Coherent  Orientation:  Full (Time, Place, and Person)  Thought Content: Logical   Suicidal Thoughts:  No  Homicidal Thoughts:  No  Memory:  Immediate;   Good Recent;   Good  Judgement:  Good  Insight:  Good  Psychomotor Activity:  Normal  Concentration:  Concentration: Good  Recall:  Good  Fund of Knowledge: Good  Language: Good  Akathisia:  No  Handed:  Right  AIMS (if indicated): done  Assets:  Communication Skills Desire for Improvement Social Support  ADL's:  Intact  Cognition: WNL  Sleep:  Good   Screenings: GAD-7    Health and safety inspector from 10/05/2021 in [facility] Visit from 05/22/2021 in [facility] Video Visit from 02/19/2021 in [facility] Video Visit from 11/20/2020 in [facility] Video Visit from 08/26/2020 in [facility]  Total GAD-7 Score 9 13 10 7 15       PHQ2-9    Flowsheet Row Counselor from 10/13/2021 in [facility] Counselor from 10/05/2021 in [facility] Video Visit from 05/22/2021 in [facility] Video Visit from 02/19/2021 in [facility] Video Visit from 11/20/2020 in [place]  PHQ-2 Total Score 4 3 2 4 2   PHQ-9 Total Score 19 14 12 15 8       Flowsheet Row Counselor from 10/13/2021 in [facility] Counselor from 10/05/2021 in [facility] ED from 10/02/2021 in [facility]  C-SSRS RISK CATEGORY No Risk No Risk No Risk        Assessment and Plan:  Continue partial  hospitalization programming- [facility]   Collaboration of Care: Collaboration of Care: continue medications as indicated   Patient/Guardian was advised Release of Information  must be obtained prior to any record release in order to collaborate their care with an outside provider. Patient/Guardian was advised if they have not already done so to contact the registration department to sign all necessary forms in order for [place] to release information regarding their care.   Consent: Patient/Guardian gives verbal consent for treatment and assignment of benefits for services provided during this visit. Patient/Guardian expressed understanding and agreed to proceed.    Derrill Center, NP 10/20/2021, 11:52 AM

## 2021-10-20 NOTE — Progress Notes (Signed)
Spoke with patient via webex video call, used 2 identifiers to correctly identify patient. States she slept a lot this weekend due to all the rain. Group is going good, she finds it educational. Has weird dreams at times. Was happy about her parents giving her money to pay mortgage. Now she is no longer at risk of foreclosure. States they did it for her anniversary. Was upset that her husband lied to her about paying the mortgage. On scale 1-10 as 10 being worst she rates depression at 5 and anxiety at 5. Denies SI/HI or AV hallucinations. No issues or complaints. No side effects from medications.

## 2021-10-21 ENCOUNTER — Ambulatory Visit (INDEPENDENT_AMBULATORY_CARE_PROVIDER_SITE_OTHER): Payer: No Payment, Other | Admitting: Licensed Clinical Social Worker

## 2021-10-21 DIAGNOSIS — F0632 Mood disorder due to known physiological condition with major depressive-like episode: Secondary | ICD-10-CM | POA: Diagnosis not present

## 2021-10-21 DIAGNOSIS — F411 Generalized anxiety disorder: Secondary | ICD-10-CM

## 2021-10-22 ENCOUNTER — Ambulatory Visit (INDEPENDENT_AMBULATORY_CARE_PROVIDER_SITE_OTHER): Payer: No Payment, Other | Admitting: Licensed Clinical Social Worker

## 2021-10-22 DIAGNOSIS — F0632 Mood disorder due to known physiological condition with major depressive-like episode: Secondary | ICD-10-CM | POA: Diagnosis not present

## 2021-10-22 DIAGNOSIS — F411 Generalized anxiety disorder: Secondary | ICD-10-CM

## 2021-10-23 ENCOUNTER — Ambulatory Visit (INDEPENDENT_AMBULATORY_CARE_PROVIDER_SITE_OTHER): Payer: No Payment, Other | Admitting: Licensed Clinical Social Worker

## 2021-10-23 DIAGNOSIS — F0632 Mood disorder due to known physiological condition with major depressive-like episode: Secondary | ICD-10-CM

## 2021-10-23 DIAGNOSIS — F411 Generalized anxiety disorder: Secondary | ICD-10-CM

## 2021-10-26 ENCOUNTER — Ambulatory Visit (INDEPENDENT_AMBULATORY_CARE_PROVIDER_SITE_OTHER): Payer: No Payment, Other | Admitting: Licensed Clinical Social Worker

## 2021-10-26 DIAGNOSIS — F0632 Mood disorder due to known physiological condition with major depressive-like episode: Secondary | ICD-10-CM | POA: Diagnosis not present

## 2021-10-26 DIAGNOSIS — F411 Generalized anxiety disorder: Secondary | ICD-10-CM

## 2021-10-27 ENCOUNTER — Telehealth (HOSPITAL_COMMUNITY): Payer: Self-pay | Admitting: Professional

## 2021-10-28 ENCOUNTER — Ambulatory Visit (INDEPENDENT_AMBULATORY_CARE_PROVIDER_SITE_OTHER): Payer: No Payment, Other | Admitting: Licensed Clinical Social Worker

## 2021-10-28 DIAGNOSIS — F0632 Mood disorder due to known physiological condition with major depressive-like episode: Secondary | ICD-10-CM

## 2021-10-28 DIAGNOSIS — F411 Generalized anxiety disorder: Secondary | ICD-10-CM

## 2021-10-28 NOTE — Progress Notes (Signed)
Spoke with patient via Webex video call, used 2 identifiers to correctly identify patient. States that groups are going well. Is concerned her antidepressant isn't working like it should but will discuss this at her next appointment on June 14th. Other than that she feels better, her family life is improving. On scale 1-10 as 10 being worst she rates depression at 3/4 or anxiety at 3/4. Denies SI/HI or AV hallucinations. No side effects from medications. No issues or complaints.

## 2021-10-29 ENCOUNTER — Ambulatory Visit (INDEPENDENT_AMBULATORY_CARE_PROVIDER_SITE_OTHER): Payer: No Payment, Other | Admitting: Licensed Clinical Social Worker

## 2021-10-29 DIAGNOSIS — F0632 Mood disorder due to known physiological condition with major depressive-like episode: Secondary | ICD-10-CM

## 2021-10-29 DIAGNOSIS — F411 Generalized anxiety disorder: Secondary | ICD-10-CM

## 2021-10-30 ENCOUNTER — Ambulatory Visit (INDEPENDENT_AMBULATORY_CARE_PROVIDER_SITE_OTHER): Payer: No Payment, Other | Admitting: Licensed Clinical Social Worker

## 2021-10-30 DIAGNOSIS — F0632 Mood disorder due to known physiological condition with major depressive-like episode: Secondary | ICD-10-CM

## 2021-10-30 DIAGNOSIS — F411 Generalized anxiety disorder: Secondary | ICD-10-CM

## 2021-11-01 ENCOUNTER — Encounter (HOSPITAL_COMMUNITY): Payer: Self-pay | Admitting: Licensed Clinical Social Worker

## 2021-11-01 NOTE — Progress Notes (Signed)
Virtual Visit via Video Note  I connected with [name] on 11/01/21 at  9:00 AM EDT by a video enabled telemedicine application and verified that I am speaking with the correct person using two identifiers.  Location: Patient: Home Provider:Office   I discussed the limitations of evaluation and management by telemedicine and the availability of in person appointments. The patient expressed understanding and agreed to proceed.   I discussed the assessment and treatment plan with the patient. The patient was provided an opportunity to ask questions and all were answered. The patient agreed with the plan and demonstrated an understanding of the instructions.   The patient was advised to call back or seek an in-person evaluation if the symptoms worsen or if the condition fails to improve as anticipated.  I provided 15 minutes of non-face-to-face time during this encounter.   Oneta Rack, NP   [facility]  Catherine Koch [id number]  Admission date: 10/13/2021 Discharge date: 10/30/2021  Reason for admission: Catherine Koch 46 year old Caucasian female presents due to worsening depression and anxiety.  She was referred by her current psychiatric provider.  States she is prescribed Cymbalta and hydroxyzine which she reports taking and tolerating well most recently.  Reports ongoing stressors related to financial situation and chronic pain.  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   She reports a history with major depressive disorder, posttraumatic stress disorder, generalized anxiety disorder.  Reports previous inpatient admissions.  Denied illicit drug use or substance abuse history.  Reports a history of alcohol abuse trying relationship between she and her mother.  Patient to start intensive outpatient programming  10/14/2018  Family of Origin Issues: Ongoing  Progress in Program Toward Treatment Goals: Progressing patient attended and participated with daily group sessions with active and engaged participation.  Denying suicidal or homicidal ideations at discharge.  Declined any medication refills at this time.  Patient to keep all follow-up outpatient appointments.  Progress (rationale): Keep follow-up appointment with Dr. Gretchen Short nurse practitioner and therapist Ardith Dark  Collaboration of Care: [facility]) AEB additional support groups for group therapy to be provided  Patient/Guardian was advised Release of Information must be obtained prior to any record release in order to collaborate their care with an outside provider. Patient/Guardian was advised if they have not already done so to contact the registration department to sign all necessary forms in order for [place] to release information regarding their care.   Consent: Patient/Guardian gives verbal consent for treatment and assignment of benefits for services provided during this visit. Patient/Guardian expressed understanding and agreed to proceed.   [name] NP 11/01/2021

## 2021-11-02 ENCOUNTER — Ambulatory Visit (HOSPITAL_COMMUNITY): Payer: No Payment, Other

## 2021-11-03 ENCOUNTER — Other Ambulatory Visit: Payer: Self-pay

## 2021-11-03 ENCOUNTER — Encounter (HOSPITAL_BASED_OUTPATIENT_CLINIC_OR_DEPARTMENT_OTHER): Payer: Self-pay

## 2021-11-03 ENCOUNTER — Emergency Department (HOSPITAL_BASED_OUTPATIENT_CLINIC_OR_DEPARTMENT_OTHER): Payer: Medicaid Other

## 2021-11-03 ENCOUNTER — Ambulatory Visit (HOSPITAL_COMMUNITY): Payer: No Payment, Other

## 2021-11-03 ENCOUNTER — Emergency Department (HOSPITAL_BASED_OUTPATIENT_CLINIC_OR_DEPARTMENT_OTHER)
Admission: EM | Admit: 2021-11-03 | Discharge: 2021-11-03 | Disposition: A | Discharge status: Home / Self Care | Payer: Medicaid Other | Attending: Emergency Medicine | Admitting: Emergency Medicine

## 2021-11-03 DIAGNOSIS — L03116 Cellulitis of left lower limb: Secondary | ICD-10-CM | POA: Insufficient documentation

## 2021-11-03 DIAGNOSIS — Z79899 Other long term (current) drug therapy: Secondary | ICD-10-CM | POA: Insufficient documentation

## 2021-11-03 DIAGNOSIS — Z7984 Long term (current) use of oral hypoglycemic drugs: Secondary | ICD-10-CM | POA: Insufficient documentation

## 2021-11-03 MED ORDER — CEPHALEXIN 500 MG PO CAPS: 500.0000 mg | ORAL_CAPSULE | Freq: Three times a day (TID) | ORAL | 0 refills | Status: AC

## 2021-11-03 NOTE — ED Triage Notes (Signed)
Pain and anterior redness to right foot.  Symptoms started app one week ago.

## 2021-11-03 NOTE — ED Notes (Signed)
Patient transported to Ultrasound 

## 2021-11-03 NOTE — ED Provider Notes (Signed)
[facility] EMERGENCY DEPT Provider Note   CSN: [id number] Arrival date & time: 11/03/21  0353     History  Chief Complaint  Patient presents with   Foot Pain   Catherine Koch is a 46 y.o. female with PMHx of obesity BMI 54, premature ovarian failure, who presents for one week history of left foot pain. She reports left foot swelling and discoloration that has slowly worsened over the last week. She denies fevers, chills though endorses more fatigue and malaise. She denies recent injury to the area, no open sores or lesions. She has had cellulitis in this foot before and reports her recent symptoms are similar to how her cellulitis presented last time. She is still able to ambulate on this foot though it is uncomfortable. No calf pain at rest or with ambulation. She takes spironolactone for lower extremity swelling though has not been able to pick up this medicine due to cost, has been reaching out to her PCP to get this approved under med assist program. Reports swelling and tightness of skin worse on LLE.    Home Medications Prior to Admission medications   Medication Sig Start Date End Date Taking? Authorizing Provider  cephALEXin (KEFLEX) 500 MG capsule Take 1 capsule (500 mg total) by mouth 3 (three) times daily for 7 days. 11/03/21 11/10/21 Yes Ellison Carwin, MD  acetaminophen (TYLENOL) 500 MG tablet Take 1,000 mg by mouth every 6 (six) hours as needed for fever.    [provider]  diclofenac (VOLTAREN) 75 MG EC tablet Take 75 mg by mouth 2 (two) times daily.    [provider]  DULoxetine HCl 40 MG CPEP Take 80 mg by mouth daily. 08/06/21   Penn, Cranston Neighbor, NP  hydrOXYzine (ATARAX) 25 MG tablet Take 1 tablet (25 mg total) by mouth at bedtime. 08/06/21   Penn, Cranston Neighbor, NP  levocetirizine (XYZAL) 5 MG tablet Take 5 mg by mouth every evening.    [provider]  metFORMIN (GLUMETZA) 500 MG (MOD) 24 hr tablet Take 500 mg by mouth daily with breakfast.     [provider]  methocarbamol (ROBAXIN) 500 MG tablet Take 2 tablets (1,000 mg total) by mouth 2 (two) times daily. 10/02/21   Cardama, Amadeo Garnet, MD  metoprolol tartrate (LOPRESSOR) 25 MG tablet Take 25 mg by mouth 2 (two) times daily.    [provider]  misoprostol (CYTOTEC) 200 MCG tablet Take 200 mcg by mouth 2 (two) times daily as needed (stomach ulcers). Patient not taking: Reported on 10/13/2021    [provider]  omeprazole (PRILOSEC) 40 MG capsule TAKE 1 CAPSULE BY MOUTH DAILY 02/13/21   Napoleon Form, MD  ondansetron (ZOFRAN ODT) 4 MG disintegrating tablet Take 1 tablet (4 mg total) by mouth every 8 (eight) hours as needed for nausea or vomiting. 11/11/20   Petrucelli, Pleas Koch, PA-C  spironolactone (ALDACTONE) 25 MG tablet Take 25 mg by mouth daily.    [provider]  traZODone (DESYREL) 100 MG tablet Take 1-1.5 tablets (100-150 mg total) by mouth at bedtime as needed for sleep. 08/06/21   Penn, Cranston Neighbor, NP  Turmeric (QC TUMERIC COMPLEX PO) Take 1 tablet by mouth daily.    [provider]  ranitidine (ZANTAC) 150 MG tablet Take 1 tablet (150 mg total) by mouth 2 (two) times daily. 10/25/17 08/04/19  Muthersbaugh, Dahlia Client, PA-C      Allergies    Sulfa antibiotics    Review of Systems   Review of  Systems  Constitutional:  Positive for fatigue. Negative for chills, diaphoresis and fever.  Respiratory:  Negative for shortness of breath.   Cardiovascular:  Positive for leg swelling. Negative for chest pain.  Gastrointestinal:  Positive for nausea. Negative for abdominal pain.  Genitourinary:  Negative for dysuria, flank pain and frequency.  Skin:  Positive for color change (L forefoot).  Neurological:  Negative for light-headedness.    Physical Exam Updated Vital Signs BP (!) 141/78 (BP Location: Right Wrist)   Pulse 64   Temp 98.2 F (36.8 C)   Resp 18   Ht 5\' 8"  (1.727 m)   Wt (!) 162.8 kg   LMP 06/29/2013 Comment:  waiver signed  SpO2 100%   BMI 54.59 kg/m  Physical Exam Constitutional:      General: She is not in acute distress.    Appearance: She is obese. She is not toxic-appearing.  HENT:     Head: Normocephalic and atraumatic.  Eyes:     General: No scleral icterus.    Conjunctiva/sclera: Conjunctivae normal.  Cardiovascular:     Rate and Rhythm: Normal rate and regular rhythm.     Pulses:          Dorsalis pedis pulses are 1+ on the right side and 1+ on the left side.     Heart sounds: Normal heart sounds.     Comments: Swelling distal LLE>RLE Pulmonary:     Effort: Pulmonary effort is normal.     Breath sounds: Normal breath sounds.  Abdominal:     General: Abdomen is flat. There is no distension.  Musculoskeletal:        General: Swelling (LLE>RLE) and tenderness present. No signs of injury.     Right lower leg: 1+ Pitting Edema present.     Left lower leg: 1+ Pitting Edema present.  Skin:    General: Skin is warm and dry.     Findings: Erythema (very mild erythema left forefoot/first toe) present.  Neurological:     Mental Status: She is alert and oriented to person, place, and time. Mental status is at baseline.  Psychiatric:        Mood and Affect: Mood normal.        Behavior: Behavior normal.     ED Results / Procedures / Treatments   Labs (all labs ordered are listed, but only abnormal results are displayed) Labs Reviewed - No data to display  EKG None  Radiology US Venous Img Lower Unilateral Left (DVT)  Result Date: 11/03/2021 CLINICAL DATA:  Foot pain and anterior erythema for 1 week. EXAM: LEFT LOWER EXTREMITY VENOUS DOPPLER ULTRASOUND TECHNIQUE: Gray-scale sonography with compression, as well as color and duplex ultrasound, were performed to evaluate the deep venous system(s) from the level of the common femoral vein through the popliteal and proximal calf veins. COMPARISON:  None Available. FINDINGS: VENOUS Normal compressibility of the common femoral,  superficial femoral, and popliteal veins, as well as the visualized calf veins. Visualized portions of profunda femoral vein and great saphenous vein unremarkable. No filling defects to suggest DVT on grayscale or color Doppler imaging. Doppler waveforms show normal direction of venous flow, normal respiratory plasticity and response to augmentation. Limited views of the contralateral common femoral vein are unremarkable. OTHER None. Limitations: none IMPRESSION: No evidence of deep venous thrombosis in the left lower extremity. Electronically Signed   By: Delbert PhenixJason A Poff M.D.   On: 11/03/2021 08:51     Medications Ordered in ED Medications - No data  to display  ED Course/ Medical Decision Making/ A&P                           Medical Decision Making Risk Prescription drug management.  AKEISHA LAGERQUIST is a 46 y.o. female with PMHx of obesity, premature ovarian failure, who presents for one week history of left foot pain and swelling. Patient arrived afebrile, HDS, denies fevers and chills. Initial differential included cellulitis of left forefoot though on exam there is appreciable swelling but no significant erythema or increase in calor. Patient's distal LLE more swollen, will obtain US to rule out DVT. Also considered acute limb ischemia and PVD though weak pedal pulses appreciated and patient denied symptoms of claudication.   [place] left lower extremity negative for DVT. Suspect this is mild cellulitis and will discharge patient with prescription for keflex. She is HDS and agreeable to plan for treatment with antibiotics and OTC medications for pain, and close follow up with her PCP. She will need to discuss her difficulties obtaining some of her medications with her PCP at follow up. ED return precautions discussed. All questions and concern addressed.   Final Clinical Impression(s) / ED Diagnoses Final diagnoses:  Cellulitis of left foot    Rx / DC Orders ED Discharge Orders           Ordered    cephALEXin (KEFLEX) 500 MG capsule  3 times daily        11/03/21 0903              Ellison Carwin, MD 11/03/21 0920    Franne Forts, DO 11/04/21 3244

## 2021-11-03 NOTE — ED Notes (Signed)
Pt taken before Pt vital could be updated. NO TECH

## 2021-11-03 NOTE — Discharge Instructions (Addendum)
You were evaluated at [facility] ED for left foot skin infection (cellulitis). We would like for you to start taking the following medications:  Keflex 500mg  tablet (antibiotic), once in the morning, once midday, once in the evening for 7 days.   Please follow up with your primary care physician in 1-2 weeks to ensure infection has cleared, and also to sort out the problem obtaining some of your medications.   If your swelling worsens, pain worsens, you start having fevers, the discoloration worsens, please return to the ED for re-evaluation.   Thank you for allowing to be part of your care.

## 2021-11-04 ENCOUNTER — Telehealth (INDEPENDENT_AMBULATORY_CARE_PROVIDER_SITE_OTHER): Payer: No Payment, Other | Admitting: Psychiatry

## 2021-11-04 ENCOUNTER — Ambulatory Visit (HOSPITAL_COMMUNITY): Payer: No Payment, Other

## 2021-11-04 ENCOUNTER — Encounter (HOSPITAL_COMMUNITY): Payer: Self-pay | Admitting: Psychiatry

## 2021-11-04 DIAGNOSIS — F330 Major depressive disorder, recurrent, mild: Secondary | ICD-10-CM

## 2021-11-04 DIAGNOSIS — F411 Generalized anxiety disorder: Secondary | ICD-10-CM | POA: Diagnosis not present

## 2021-11-04 DIAGNOSIS — F900 Attention-deficit hyperactivity disorder, predominantly inattentive type: Secondary | ICD-10-CM | POA: Diagnosis not present

## 2021-11-04 MED ORDER — HYDROXYZINE HCL 25 MG PO TABS: 25.0000 mg | ORAL_TABLET | Freq: Three times a day (TID) | ORAL | 3 refills | Status: DC

## 2021-11-04 MED ORDER — ATOMOXETINE HCL 40 MG PO CAPS: 40.0000 mg | ORAL_CAPSULE | Freq: Every day | ORAL | 3 refills | Status: DC

## 2021-11-04 MED ORDER — DULOXETINE HCL 40 MG PO CPEP: 80.0000 mg | ORAL_CAPSULE | Freq: Every day | ORAL | 3 refills | Status: DC

## 2021-11-04 MED ORDER — TRAZODONE HCL 100 MG PO TABS: 100.0000 mg | ORAL_TABLET | Freq: Every evening | ORAL | 3 refills | Status: DC | PRN

## 2021-11-04 NOTE — Progress Notes (Signed)
BH MD/PA/NP OP Progress Note Virtual Visit via Video Note  I connected with Catherine Koch on 11/04/21 at  4:00 PM EDT by a video enabled telemedicine application and verified that I am speaking with the correct person using two identifiers.  Location: Patient: Home Provider: Clinic   I discussed the limitations of evaluation and management by telemedicine and the availability of in person appointments. The patient expressed understanding and agreed to proceed.  I provided 30 minutes of non-face-to-face time during this encounter.    11/04/2021 3:49 PM Catherine Koch  MRN:  [id number]  Chief Complaint: "I feel like my anxiety is increased and I cannot concentrate"   HPI: 46 year old female seen today  for follow-up psychiatric evaluation. She has a history of auditory processing disorder (as a child). depression, PTSD,  and anxiety, and is currently managed on Hydroxyzine  at bedtime, Cymbalta 80 mg daily, and Trazodone 150 mg at bedtime.    Today she was unable to login virtually for assessment was done over the phone.  During exam she was pleasant, cooperative, and engaged in conversation. She notes that at times her anxiety is increased. She reports that she has been grinding her teeth more and tapping her arm/leg. She also notes that she has been having problems concentrating.  She notes that when she was younger she was diagnosed with auditory processing disorder.  She informed Clinical research associate that her listening skills has improved however notes that now she is distractible, forgetful, inattentive to mentally taxing task, and often is disorganized.  She informed Clinical research associate that she also have difficulties following routines.   Patient notes that her anxiety and depression continues to be bothersome.  Patient reports that she is worried about her health.  She currently has cellulitis in her foot and is on antibiotics.  Provider conducted a GAD-7 and patient scored a 15.  Provider also conducted  PHQ-9 and patient scored an 18.  Today she denies SI/HI/VAH, mania, paranoia.    Today patient agreeable to starting Strattera 40 mg to help manage symptoms of ADHD.  She is also agreeable to increasing hydroxyzine 25 mg nightly to 25 mg 3 times daily to help manage anxiety.  She will continue her other medications as prescribed. Potential side effects of medication and risks vs benefits of treatment vs non-treatment were explained and discussed. All questions were answered.  Cerner at this time.   Visit Diagnosis:    ICD-10-CM   1. Attention deficit hyperactivity disorder (ADHD), predominantly inattentive type  F90.0 atomoxetine (STRATTERA) 40 MG capsule    2. Generalized anxiety disorder  F41.1 hydrOXYzine (ATARAX) 25 MG tablet    3. Mild episode of recurrent major depressive disorder (HCC)  F33.0 traZODone (DESYREL) 100 MG tablet    DULoxetine HCl 40 MG CPEP      Past Psychiatric History: Auditory processing disorder (as a child), PTSD, anxiety, and depression  Past Medical History:  Past Medical History:  Diagnosis Date   Anxiety    Arthritis    pt. states in knees   Chronic gastritis    Chronic sinus infection    Closed fracture of right distal fibula 11/19/2013   Complication of anesthesia    pt. states difficult to wake up   Depression    GERD (gastroesophageal reflux disease)    Headache    pt. states random migraines   Hemorrhoids    High cholesterol    History of bronchitis    History of degenerative disc disease  Hypertension    Ovarian failure    PTSD (post-traumatic stress disorder)    Shortness of breath    exertion from crutches   Sleep apnea    cpap   UTI (lower urinary tract infection)    Wears glasses     Past Surgical History:  Procedure Laterality Date   BIOPSY  12/10/2019   Procedure: BIOPSY;  Surgeon: Napoleon Form, MD;  Location: [facility];  Service: Endoscopy;;   CHOLECYSTECTOMY  2010   CHOLECYSTECTOMY     COLONOSCOPY WITH PROPOFOL  N/A 12/10/2019   Procedure: COLONOSCOPY WITH PROPOFOL;  Surgeon: Napoleon Form, MD;  Location: [facility];  Service: Endoscopy;  Laterality: N/A;   ESOPHAGOGASTRODUODENOSCOPY     ESOPHAGOGASTRODUODENOSCOPY (EGD) WITH PROPOFOL N/A 12/10/2019   Procedure: ESOPHAGOGASTRODUODENOSCOPY (EGD) WITH PROPOFOL;  Surgeon: Napoleon Form, MD;  Location: [facility];  Service: Endoscopy;  Laterality: N/A;   NASAL SEPTOPLASTY W/ TURBINOPLASTY Bilateral 12/11/2014   Procedure: NASAL SEPTOPLASTY AND BILATERAL INFERIOR TURBINATE REDUCTION;  Surgeon: Osborn Coho, MD;  Location: [facility] OR;  Service: ENT;  Laterality: Bilateral;   ORIF FIBULA FRACTURE Right 11/19/2013   Procedure: OPEN REDUCTION INTERNAL FIXATION (ORIF) RIGHT FIBULA FRACTURE;  Surgeon: Eulas Post, MD;  Location: [facility] OR;  Service: Orthopedics;  Laterality: Right;   WISDOM TOOTH EXTRACTION      Family Psychiatric History: Sister anxiety and depression. Notes mother has untreated mental health conditions  Family History:  Family History  Problem Relation Age of Onset   Depression Sister    Allergies Sister    Cancer Paternal Grandfather    Heart disease Paternal Grandmother    Cancer Paternal Grandmother        BREAST CANCER    Social History:  Social History   Socioeconomic History   Marital status: Married    Spouse name: Not on file   Number of children: 0   Years of education: Not on file   Highest education level: Some college, no degree  Occupational History    Comment: UNEMPOLYEED   Tobacco Use   Smoking status: Former    Types: Cigarettes    Quit date: 05/24/2010    Years since quitting: 11.4    Passive exposure: Never   Smokeless tobacco: Never   Tobacco comments:    Pt. states she used to be a social smoker  Vaping Use   Vaping Use: Never used  Substance and Sexual Activity   Alcohol use: Yes   Drug use: No   Sexual activity: Yes    Birth control/protection: None  Other Topics Concern   Not on file   Social History Narrative   Not on file   Social Determinants of Health   Financial Resource Strain: Not on file  Food Insecurity: Not on file  Transportation Needs: Not on file  Physical Activity: Not on file  Stress: Not on file  Social Connections: Not on file    Allergies:  Allergies  Allergen Reactions   Sulfa Antibiotics Anaphylaxis, Swelling and Rash    Throat swelling/ rashes    Metabolic Disorder Labs: No results found for: "HGBA1C", "MPG" No results found for: "PROLACTIN" No results found for: "CHOL", "TRIG", "HDL", "CHOLHDL", "VLDL", "LDLCALC" Lab Results  Component Value Date   TSH 1.90 04/11/2012    Therapeutic Level Labs: No results found for: "LITHIUM" No results found for: "VALPROATE" No results found for: "CBMZ"  Current Medications: Current Outpatient Medications  Medication Sig Dispense Refill   atomoxetine (STRATTERA) 40 MG  capsule Take 1 capsule (40 mg total) by mouth daily. 30 capsule 3   acetaminophen (TYLENOL) 500 MG tablet Take 1,000 mg by mouth every 6 (six) hours as needed for fever.     cephALEXin (KEFLEX) 500 MG capsule Take 1 capsule (500 mg total) by mouth 3 (three) times daily for 7 days. 21 capsule 0   diclofenac (VOLTAREN) 75 MG EC tablet Take 75 mg by mouth 2 (two) times daily.     DULoxetine HCl 40 MG CPEP Take 80 mg by mouth daily. 60 capsule 3   hydrOXYzine (ATARAX) 25 MG tablet Take 1 tablet (25 mg total) by mouth 3 (three) times daily. 90 tablet 3   metFORMIN (GLUMETZA) 500 MG (MOD) 24 hr tablet Take 500 mg by mouth daily with breakfast.     methocarbamol (ROBAXIN) 500 MG tablet Take 2 tablets (1,000 mg total) by mouth 2 (two) times daily. 20 tablet 0   metoprolol tartrate (LOPRESSOR) 25 MG tablet Take 25 mg by mouth 2 (two) times daily.     misoprostol (CYTOTEC) 200 MCG tablet Take 200 mcg by mouth 2 (two) times daily as needed (stomach ulcers). (Patient not taking: Reported on 10/13/2021)     omeprazole (PRILOSEC) 40 MG capsule  TAKE 1 CAPSULE BY MOUTH DAILY 90 capsule 3   ondansetron (ZOFRAN ODT) 4 MG disintegrating tablet Take 1 tablet (4 mg total) by mouth every 8 (eight) hours as needed for nausea or vomiting. 8 tablet 0   spironolactone (ALDACTONE) 25 MG tablet Take 25 mg by mouth daily.     traZODone (DESYREL) 100 MG tablet Take 1-1.5 tablets (100-150 mg total) by mouth at bedtime as needed for sleep. 45 tablet 3   Turmeric (QC TUMERIC COMPLEX PO) Take 1 tablet by mouth daily.     No current facility-administered medications for this visit.     Musculoskeletal: Strength & Muscle Tone:  Unable to assess due to telephone visit Gait & Station:  Unable to assess due to telephone visit Patient leans: N/A  Psychiatric Specialty Exam: Review of Systems  Last menstrual period 06/29/2013.There is no height or weight on file to calculate BMI.  General Appearance:  Unable to assess due to telephone visit  Eye Contact:   Unable to assess due to telephone visit  Speech:  Clear and Coherent and Normal Rate  Volume:  Normal  Mood:  Anxious and Depressed   Affect:  Congruent  Thought Process:  Coherent, Goal Directed and Linear  Orientation:  Full (Time, Place, and Person)  Thought Content: WDL and Logical   Suicidal Thoughts:  No  Homicidal Thoughts:  No  Memory:  Immediate;   Good Recent;   Good Remote;   Good  Judgement:  Good  Insight:  Good  Psychomotor Activity:  Normal  Concentration:  Concentration: Fair and Attention Span: Fair  Recall:  Good  Fund of Knowledge: Good  Language: Good  Akathisia:  No  Handed:  Right  AIMS (if indicated): Not done  Assets:  Communication Skills Desire for Improvement Financial Resources/Insurance Housing Intimacy Social Support  ADL's:  Intact  Cognition: WNL  Sleep:  Good   Screenings: GAD-7    Flowsheet Row Video Visit from 11/04/2021 in [facility] Counselor from 10/05/2021 in [facility] Video  Visit from 05/22/2021 in [facility] Video Visit from 02/19/2021 in [facility] Video Visit from 11/20/2020 in [facility]  Total GAD-7  Score 15 9 13 10 7       PHQ2-9    Flowsheet Row [facility]  PHQ-2 Total Score 4 4 3 2 4   PHQ-9 Total Score 18 19 14 12 15       Flowsheet Row [facility] from 11/03/2021 in [facility] Counselor [facility]  C-SSRS RISK CATEGORY No Risk No Risk No Risk        Assessment and Plan: Patient endorses symptoms of anxiety, depression, and ADHD.  She is agreeable to increasing hydroxyzine 25 mg nightly to 25 mg 3 times daily to help manage anxiety.  She will start Strattera 40 mg daily to help manage symptoms of ADHD.  She will continue all other medications as prescribed.    1. Generalized anxiety disorder  Increase- hydrOXYzine (ATARAX) 25 MG tablet; Take 1 tablet (25 mg total) by mouth 3 (three) times daily.  Dispense: 90 tablet; Refill: 3  2. Mild episode of recurrent major depressive disorder (HCC)  Continue- traZODone (DESYREL) 100 MG tablet; Take 1-1.5 tablets (100-150 mg total) by mouth at bedtime as needed for sleep.  Dispense: 45 tablet; Refill: 3 Continue- DULoxetine HCl 40 MG CPEP; Take 80 mg by mouth daily.  Dispense: 60 capsule; Refill: 3  3. Attention deficit hyperactivity disorder (ADHD), predominantly inattentive type  Start- atomoxetine (STRATTERA) 40 MG capsule; Take 1 capsule (40 mg  total) by mouth daily.  Dispense: 30 capsule; Refill: 3   Follow-up in 3 months Follow-up with therapy   Shanna CiscoBrittney E Jesslynn Kruck, NP 11/04/2021, 3:49 PM

## 2021-11-05 ENCOUNTER — Ambulatory Visit (HOSPITAL_COMMUNITY): Payer: No Payment, Other

## 2021-11-06 ENCOUNTER — Ambulatory Visit (HOSPITAL_COMMUNITY): Payer: No Payment, Other

## 2021-11-11 ENCOUNTER — Ambulatory Visit (INDEPENDENT_AMBULATORY_CARE_PROVIDER_SITE_OTHER): Payer: No Payment, Other | Admitting: Licensed Clinical Social Worker

## 2021-11-11 ENCOUNTER — Ambulatory Visit (HOSPITAL_COMMUNITY): Payer: No Payment, Other | Admitting: Licensed Clinical Social Worker

## 2021-11-11 DIAGNOSIS — F331 Major depressive disorder, recurrent, moderate: Secondary | ICD-10-CM

## 2021-11-11 NOTE — Progress Notes (Signed)
Virtual Visit via Video Note  I connected with Catherine Koch on 11/11/21 at  4:00 PM EDT by a video enabled telemedicine application and verified that I am speaking with the correct person using two identifiers.  Location: Patient: pt's home Provider: clinical office   I discussed the limitations of evaluation and management by telemedicine and the availability of in person appointments. The patient expressed understanding and agreed to proceed.  I discussed the assessment and treatment plan with the patient. The patient was provided an opportunity to ask questions and all were answered. The patient agreed with the plan and demonstrated an understanding of the instructions.   The patient was advised to call back or seek an in-person evaluation if the symptoms worsen or if the condition fails to improve as anticipated.  I provided 33 minutes minutes of non-face-to-face time during this encounter.   Wyvonnia Lora, LCSWA    THERAPIST PROGRESS NOTE  Session Time: 33 minutes  Participation Level: Active  Behavioral Response: CasualDrowsyDepressed  Type of Therapy: Individual Therapy  Treatment Goals addressed: coping  ProgressTowards Goals: Progressing  Interventions: Supportive  Summary: Catherine Koch is a 46 y.o. female who presents for f/u with this cln after d/c from [facility]. She signs onto session on time and her eyes remain closed for about half the session. She appears to be lying in bed. Reports she is feeling tired and states her pain has been better, but now her gastritis is flaring up due to not being on meds. Reports her adopted daughter moved back to [place] and since then, conflict with her husband has been easier. "I love my husband, but he is bad with money." Reports she has brought up divorce with him due to some of his behaviors. She details how he recently lied about mortgage payments and her parents had to "bail [place] out" to avoid foreclosure. She reports she has not  been streaming lately because of mental health and combating cellulitis in one of her feet. She reports she recently started ADHD meds two days ago. "I do think the therapy helped me figure some things out." She also reports her best friend recently told her she is not acting like herself, and pt states she noticed the change in herself since being with her husband, and adds she also has OCD and when living with parents everything was in place. She reports she knows he cares about her, and that he has been making some effort, but she wonders if it's enough. She states he repeatedly puts off psychiatric evaluations. She reports feeling overwhelmed with the amount of cleaning/organizing that needs to be done and she states she doesn't know where to start. She continues to express that she is reevaluating whether she wants to continue her marriage and states her parents are financially supportive, but she is not willing to live with them again. She states they are "running out of food" and clarifies that they do not have enough easy meals that she could make on her own without increased pain, but states they have enough food in their refrigerator. She states her husband has been cooking and cleaning more. She reiterates her frustration with her home and is receptive to feedback from cln. She denies adverse side effects from medication. Pt agreeable to ending session after 33 minutes due to feeling tired.  Suicidal/Homicidal: Nowithout intent/plan  Therapist Response: Cln assessed for current stressors, symptoms, and safety since last session. Cln utilized active listening and validation to assist  with processing. Cln voiced support of continuing to reflect before making big decisions and also recommended exploring trial separation before discussing divorce. Cln also tasked pt with identifying the room in which she spends the most time and making a list of tasks to complete without assigning a timeframe to decrease  the likelihood of her feeling pressured. Cln verbalized strengths and scheduled follow-up appointments and confirmed pt's availability and preferred method of service delivery (virtual). Cln also encouraged pt to call front office to inquire about last-minute cancellations or no-shows should she feel the need to engage in therapy sooner.  Plan: Return again in 8 weeks (next available appt).  Diagnosis: Major depressive disorder, recurrent episode, moderate (HCC)  Collaboration of Care: Other none required for this visit.  Patient/Guardian was advised Release of Information must be obtained prior to any record release in order to collaborate their care with an outside provider. Patient/Guardian was advised if they have not already done so to contact the registration department to sign all necessary forms in order for [place] to release information regarding their care.   Consent: Patient/Guardian gives verbal consent for treatment and assignment of benefits for services provided during this visit. Patient/Guardian expressed understanding and agreed to proceed.   Wyvonnia Lora, LCSWA 11/11/2021

## 2021-11-13 ENCOUNTER — Telehealth: Payer: Self-pay | Admitting: Gastroenterology

## 2021-11-13 MED ORDER — OMEPRAZOLE 40 MG PO CPDR: 40.0000 mg | DELAYED_RELEASE_CAPSULE | Freq: Every day | ORAL | 3 refills | Status: DC

## 2021-11-13 NOTE — Telephone Encounter (Signed)
Patient called said she still has refill available for the Omeprazole medication but she now joined [employer] and they require for the office to call them at [phone] in order for her to be able to get the medication through them.

## 2022-01-02 ENCOUNTER — Encounter (HOSPITAL_COMMUNITY): Payer: Self-pay | Admitting: Emergency Medicine

## 2022-01-02 ENCOUNTER — Other Ambulatory Visit: Payer: Self-pay

## 2022-01-02 ENCOUNTER — Emergency Department (HOSPITAL_COMMUNITY)
Admission: EM | Admit: 2022-01-02 | Discharge: 2022-01-02 | Disposition: A | Discharge status: Home / Self Care | Payer: Self-pay | Attending: Emergency Medicine | Admitting: Emergency Medicine

## 2022-01-02 DIAGNOSIS — Z79899 Other long term (current) drug therapy: Secondary | ICD-10-CM | POA: Insufficient documentation

## 2022-01-02 DIAGNOSIS — I10 Essential (primary) hypertension: Secondary | ICD-10-CM | POA: Insufficient documentation

## 2022-01-02 DIAGNOSIS — R5383 Other fatigue: Secondary | ICD-10-CM | POA: Insufficient documentation

## 2022-01-02 DIAGNOSIS — Z20822 Contact with and (suspected) exposure to covid-19: Secondary | ICD-10-CM | POA: Insufficient documentation

## 2022-01-02 DIAGNOSIS — R350 Frequency of micturition: Secondary | ICD-10-CM | POA: Insufficient documentation

## 2022-01-02 DIAGNOSIS — M545 Low back pain, unspecified: Secondary | ICD-10-CM | POA: Insufficient documentation

## 2022-01-02 DIAGNOSIS — D72829 Elevated white blood cell count, unspecified: Secondary | ICD-10-CM | POA: Insufficient documentation

## 2022-01-02 LAB — COMPREHENSIVE METABOLIC PANEL
ALT: 59 U/L — ABNORMAL HIGH (ref 0–44)
AST: 36 U/L (ref 15–41)
Albumin: 4.4 g/dL (ref 3.5–5.0)
Alkaline Phosphatase: 64 U/L (ref 38–126)
Anion gap: 9 (ref 5–15)
BUN: 13 mg/dL (ref 6–20)
CO2: 25 mmol/L (ref 22–32)
Calcium: 9.8 mg/dL (ref 8.9–10.3)
Chloride: 105 mmol/L (ref 98–111)
Creatinine, Ser: 1.01 mg/dL — ABNORMAL HIGH (ref 0.44–1.00)
GFR, Estimated: >60 mL/min (ref >60)
Glucose, Bld: 104 mg/dL — ABNORMAL HIGH (ref 70–99)
Potassium: 4.1 mmol/L (ref 3.5–5.1)
Sodium: 139 mmol/L (ref 135–145)
Total Bilirubin: 0.3 mg/dL (ref 0.3–1.2)
Total Protein: 8 g/dL (ref 6.5–8.1)

## 2022-01-02 LAB — URINALYSIS, ROUTINE W REFLEX MICROSCOPIC
Bacteria, UA: NONE SEEN
Bilirubin Urine: NEGATIVE
Glucose, UA: NEGATIVE mg/dL
Hgb urine dipstick: NEGATIVE
Ketones, ur: NEGATIVE mg/dL
Nitrite: NEGATIVE
Protein, ur: NEGATIVE mg/dL
Specific Gravity, Urine: 1.027 (ref 1.005–1.030)
pH: 5 (ref 5.0–8.0)

## 2022-01-02 LAB — CBC WITH DIFFERENTIAL/PLATELET
Abs Immature Granulocytes: 0.02 10*3/uL (ref 0.00–0.07)
Basophils Absolute: 0.1 10*3/uL (ref 0.0–0.1)
Basophils Relative: 1 %
Eosinophils Absolute: 0.2 10*3/uL (ref 0.0–0.5)
Eosinophils Relative: 2 %
HCT: 39.7 % (ref 36.0–46.0)
Hemoglobin: 12.9 g/dL (ref 12.0–15.0)
Immature Granulocytes: 0 %
Lymphocytes Relative: 34 %
Lymphs Abs: 3.6 10*3/uL (ref 0.7–4.0)
MCH: 30.2 pg (ref 26.0–34.0)
MCHC: 32.5 g/dL (ref 30.0–36.0)
MCV: 93 fL (ref 80.0–100.0)
Monocytes Absolute: 0.9 10*3/uL (ref 0.1–1.0)
Monocytes Relative: 8 %
Neutro Abs: 5.8 10*3/uL (ref 1.7–7.7)
Neutrophils Relative %: 55 %
Platelets: 346 10*3/uL (ref 150–400)
RBC: 4.27 MIL/uL (ref 3.87–5.11)
RDW: 13.5 % (ref 11.5–15.5)
WBC: 10.6 10*3/uL — ABNORMAL HIGH (ref 4.0–10.5)
nRBC: 0 % (ref 0.0–0.2)

## 2022-01-02 LAB — RESP PANEL BY RT-PCR (FLU A&B, COVID) ARPGX2
Influenza A by PCR: NEGATIVE
Influenza B by PCR: NEGATIVE
SARS Coronavirus 2 by RT PCR: NEGATIVE

## 2022-01-02 NOTE — Discharge Instructions (Signed)
You were seen today for multiple complaints.  There was no sign of infection on today's work-up.  You fatigue could be multifactorial including medication based or due to your sleep apnea.  I recommend following up with your primary team.  If you develop life-threatening conditions please return for reevaluation.

## 2022-01-02 NOTE — ED Triage Notes (Signed)
Patient reports she has been fatigued, had a headache, waking up sweaty, reports her abdomen felt really heavy. Sometimes it takes a while for her urine to come out. Left sided back pain. Denies dysuria, no dysuria w/ previous UTIs. Feeling of incomplete emptying. Sore throat last week, not currently.   Has not taken any medications today. Right shoulder pain, pulled a muscle. Is sleepy, could be d/t her sleep apnea.

## 2022-01-02 NOTE — ED Provider Notes (Signed)
[facility]-EMERGENCY DEPT Provider Note   CSN: [id number] Arrival date & time: 01/02/22  1028     History  Chief Complaint  Patient presents with   Urinary Frequency   Back Pain   Fatigue    Catherine Koch is a 46 y.o. female.  Patient presents to the hospital complaining of fatigue and urinary frequency.  Patient states that for the past year she has had intermittent fatigue.  She currently has a diagnosis of sleep apnea and uses CPAP to sleep.  She states that a few weeks ago she went through a therapy program where she had a meet with a group daily at 8 AM.  While following the schedule the patient states that she was feeling better and more rested.  After the therapy was over she tried to maintain the schedule but states she felt a return of the fatigue and has a hard time staying awake during the day.  The patient also complains of vague dysuria and urinary frequency.  She states she has had urinary tract infections in the past and is concerned she may have one today.  She also complains of mild low back pain.  She denies shortness of breath, chest pain, abdominal pain, diarrhea, nausea, vomiting.  The patient has past medical history significant for obstructive sleep apnea, depression, morbid obesity, major depressive disorder, generalized anxiety disorder.   HPI     Home Medications Prior to Admission medications   Medication Sig Start Date End Date Taking? Authorizing Provider  acetaminophen (TYLENOL) 500 MG tablet Take 1,000 mg by mouth every 6 (six) hours as needed for fever.    [provider]  atomoxetine (STRATTERA) 40 MG capsule Take 1 capsule (40 mg total) by mouth daily. 11/04/21   Shanna Cisco, NP  diclofenac (VOLTAREN) 75 MG EC tablet Take 75 mg by mouth 2 (two) times daily.    [provider]  DULoxetine HCl 40 MG CPEP Take 80 mg by mouth daily. 11/04/21   Shanna Cisco, NP  hydrOXYzine (ATARAX) 25 MG tablet Take 1  tablet (25 mg total) by mouth 3 (three) times daily. 11/04/21   Shanna Cisco, NP  metFORMIN (GLUMETZA) 500 MG (MOD) 24 hr tablet Take 500 mg by mouth daily with breakfast.    [provider]  methocarbamol (ROBAXIN) 500 MG tablet Take 2 tablets (1,000 mg total) by mouth 2 (two) times daily. 10/02/21   Cardama, Amadeo Garnet, MD  metoprolol tartrate (LOPRESSOR) 25 MG tablet Take 25 mg by mouth 2 (two) times daily.    [provider]  misoprostol (CYTOTEC) 200 MCG tablet Take 200 mcg by mouth 2 (two) times daily as needed (stomach ulcers). Patient not taking: Reported on 10/13/2021    [provider]  omeprazole (PRILOSEC) 40 MG capsule Take 1 capsule (40 mg total) by mouth daily. 11/13/21   Napoleon Form, MD  ondansetron (ZOFRAN ODT) 4 MG disintegrating tablet Take 1 tablet (4 mg total) by mouth every 8 (eight) hours as needed for nausea or vomiting. 11/11/20   Petrucelli, Pleas Koch, PA-C  spironolactone (ALDACTONE) 25 MG tablet Take 25 mg by mouth daily.    [provider]  traZODone (DESYREL) 100 MG tablet Take 1-1.5 tablets (100-150 mg total) by mouth at bedtime as needed for sleep. 11/04/21   Shanna Cisco, NP  Turmeric (QC TUMERIC COMPLEX PO) Take 1 tablet by mouth daily.    [provider]  ranitidine (ZANTAC) 150 MG  tablet Take 1 tablet (150 mg total) by mouth 2 (two) times daily. 10/25/17 08/04/19  Muthersbaugh, Dahlia Client, PA-C      Allergies    Sulfa antibiotics    Review of Systems   Review of Systems  Constitutional:  Positive for fatigue.  Respiratory:  Negative for shortness of breath.   Cardiovascular:  Negative for chest pain.  Gastrointestinal:  Negative for abdominal pain, nausea and vomiting.  Genitourinary:  Positive for dysuria and frequency.  Musculoskeletal:  Positive for back pain.    Physical Exam Updated Vital Signs BP (!) 153/100   Pulse 74   Temp 98.9 F (37.2 C) (Oral)   Resp 16   Ht 5\' 8"  (1.727 m)    Wt (!) 163.3 kg   SpO2 100%   BMI 54.74 kg/m  Physical Exam Vitals and nursing note reviewed.  Constitutional:      General: She is not in acute distress.    Appearance: She is obese.  HENT:     Head: Normocephalic and atraumatic.     Mouth/Throat:     Mouth: Mucous membranes are moist.  Eyes:     Conjunctiva/sclera: Conjunctivae normal.  Cardiovascular:     Rate and Rhythm: Normal rate and regular rhythm.     Pulses: Normal pulses.     Heart sounds: Normal heart sounds.  Pulmonary:     Effort: Pulmonary effort is normal.     Breath sounds: Normal breath sounds.  Abdominal:     Palpations: Abdomen is soft.     Tenderness: There is no abdominal tenderness.  Musculoskeletal:        General: Normal range of motion.     Cervical back: Normal range of motion and neck supple.     Comments: Mild tenderness to palpation of the lumbar region on the left side.  No midline tenderness.  Skin:    General: Skin is warm and dry.     Capillary Refill: Capillary refill takes less than 2 seconds.  Neurological:     Mental Status: She is alert and oriented to person, place, and time.     ED Results / Procedures / Treatments   Labs (all labs ordered are listed, but only abnormal results are displayed) Labs Reviewed  URINALYSIS, ROUTINE W REFLEX MICROSCOPIC - Abnormal; Notable for the following components:      Result Value   APPearance HAZY (*)    Leukocytes,Ua MODERATE (*)    All other components within normal limits  COMPREHENSIVE METABOLIC PANEL - Abnormal; Notable for the following components:   Glucose, Bld 104 (*)    Creatinine, Ser 1.01 (*)    ALT 59 (*)    All other components within normal limits  CBC WITH DIFFERENTIAL/PLATELET - Abnormal; Notable for the following components:   WBC 10.6 (*)    All other components within normal limits  RESP PANEL BY RT-PCR (FLU A&B, COVID) [id number]    EKG None  Radiology No results found.  Procedures Procedures    Medications  Ordered in ED Medications - No data to display  ED Course/ Medical Decision Making/ A&P                           Medical Decision Making Amount and/or Complexity of Data Reviewed Labs: ordered.   This patient presents to the ED for concern of urinary symptoms, fatigue back pain, this involves an extensive number of treatment options, and is a complaint that carries  with it a high risk of complications and morbidity.    Differential for the urinary symptoms include UTI, STI, and others  Differential for the fatigue includes medication issues, sleep apnea, thyroid issues, and others  Differential for the back pain includes fracture, dislocation, lumbar strain, and others  Co morbidities that complicate the patient evaluation  Obesity, history of UTI, depression, anxiety, gastritis   Additional history obtained:   External records from outside source obtained and reviewed including visits with gastroenterology and behavioral health for medication management visits for major depressive disorder   Lab Tests:  I Ordered, and personally interpreted labs.  The pertinent results include: WBC 10.6, moderate leukocytes on urinalysis, negative respiratory panel, creatinine 1.01   Test / Admission - Considered:  The patient has no concern over STI.  Urine showed no sign of infection at this time.  No sign of respiratory infection.  The patient does not appear to have a urinary tract infection.  No hemoglobin on urinalysis.  Creatinine 1.01 which is consistent with patient's baseline, last available value in our system was from 1 year ago but was 0.95.  The patient's lethargy could be multifactorial.  She is on multiple mental health medications and also has sleep apnea.  I see no emergent cause of her symptoms today.  Recommend she follow-up with her primary team  The patient's back pain is likely a lumbar strain.  No red flag symptoms to suggest imaging such as trauma, urinary  incontinence or retention, fecal incontinence, saddle anesthesia.  I did consider imaging but see no utility at this time.  There is no indication at this time for admission.  The patient's complaints are chronic.  There were no worrisome findings in the patient's laboratory work-up today.  She does have elevated blood pressure but vitals are otherwise normal.  She did not take her blood pressure medicines this morning.  Discharge home        Final Clinical Impression(s) / ED Diagnoses Final diagnoses:  Acute low back pain, unspecified back pain laterality, unspecified whether sciatica present  Urinary frequency  Other fatigue  Hypertension, unspecified type    Rx / DC Orders ED Discharge Orders     None         Pamala Duffel 01/02/22 1502    Lonell Grandchild, MD 01/02/22 [phone]

## 2022-01-02 NOTE — ED Provider Triage Note (Signed)
Emergency Medicine Provider Triage Evaluation Note  Catherine Koch , a 46 y.o. female  was evaluated in triage.  Pt complains of feeling of lethargy, bloating, urinary discomfort, and subjective fever.  Patient states that she has had trouble sleeping for a long period of time.  She currently uses CPAP but feels that it may not be working effectively anymore.  She states she is tired when she wakes up in the morning.  This has been ongoing for months.  She states that she was in group therapy recently for approximate 3 weeks and had a set schedule and was doing better.  She states as the therapy ended she still had a set schedule but she was feeling super tired and falling asleep during the day.  She states that for the past few days she has noticed some urinary discomfort and a feeling of bloating that improved after urination.  She denies chest pain, shortness of breath  Review of Systems  Positive: Nausea, lethargy, dysuria Negative: Chest pain, shortness of breath  Physical Exam  BP (!) 183/101 (BP Location: Right Wrist)   Pulse 80   Temp 99.4 F (37.4 C) (Oral)   Resp 18   Ht 5\' 8"  (1.727 m)   Wt (!) 163.3 kg   SpO2 99%   BMI 54.74 kg/m  Gen:   Awake, no distress   Resp:  Normal effort  MSK:   Moves extremities without difficulty  Other:    Medical Decision Making  Medically screening exam initiated at 10:50 AM.  Appropriate orders placed.  SUHANA WILNER was informed that the remainder of the evaluation will be completed by another provider, this initial triage assessment does not replace that evaluation, and the importance of remaining in the ED until their evaluation is complete.     [name], PA-C 01/02/22 1051

## 2022-01-06 ENCOUNTER — Ambulatory Visit (INDEPENDENT_AMBULATORY_CARE_PROVIDER_SITE_OTHER): Payer: No Payment, Other | Admitting: Mental Health

## 2022-01-06 ENCOUNTER — Ambulatory Visit (HOSPITAL_COMMUNITY): Payer: No Payment, Other | Admitting: Licensed Clinical Social Worker

## 2022-01-06 DIAGNOSIS — F331 Major depressive disorder, recurrent, moderate: Secondary | ICD-10-CM

## 2022-01-06 DIAGNOSIS — F411 Generalized anxiety disorder: Secondary | ICD-10-CM

## 2022-01-07 ENCOUNTER — Encounter (HOSPITAL_COMMUNITY): Payer: Self-pay

## 2022-01-07 NOTE — Progress Notes (Signed)
THERAPIST PROGRESS NOTE     Virtual Visit via Video Note  I connected with [name] on 01/07/22 at  3:00 PM EDT by a video enabled telemedicine application and verified that I am speaking with the correct person using two identifiers.  Location: Patient: home- address on file Provider: Office    I discussed the limitations of evaluation and management by telemedicine and the availability of in person appointments. The patient expressed understanding and agreed to proceed.  I discussed the assessment and treatment plan with the patient. The patient was provided an opportunity to ask questions and all were answered. The patient agreed with the plan and demonstrated an understanding of the instructions.   The patient was advised to call back or seek an in-person evaluation if the symptoms worsen or if the condition fails to improve as anticipated.    Session Time: 3:08pm (54 minutes)  Participation Level: Active  Behavioral Response: DisheveledAlertAnxious and Depressed  Type of Therapy: Individual Therapy  Treatment Goals addressed:  LTG: Reduce frequency, intensity, and duration of depression symptoms as evidenced by score of 5 or less on PHQ-9  STG: Albena decrease sxs of depression AEB development of x 3 effective coping skills with ability to reframe maladaptive thinking patterns 7/7 days or the next x 6 months  LTG: Patient will score less than 5 on the Generalized Anxiety Disorder 7 Scale (GAD-7) STG: Anaiza will increase management of stress and anxiety AEB development of problem solving skills with use as needed and utilization of x 3 distress tolerance skills within the next x 6 months STG: Essica will decrease sxs of anxiety AEB engagement and practice in relaxation and grounding coping skills 7/7 days for the next 6 months.   ProgressTowards Goals: Initial  Interventions: Motivational Interviewing and Supportive  Summary: HADASAH BRUGGER is a 46 y.o.  female who presents with dx of major depression recurrent and generalized anxiety disorder. Transfer from previous therapist. Mykell share history of seeing several therapist. Shella Maxim current stressors to be financial, state of home and discord with husband. Shares presence of anxiety and depressive sxs to include low mood, increased irritability, difficulty sleeping, reduced energy, difficulty concentrating sharing previously loving to read however currently lacking the focus to be able to engage. Shares increased restlessness, excessive worry and difficulty controlling the worry. Shares to currently lack steady income and working with lawyers to obtain disability income. Shares to be marriage however has had thoughts of divorce in the past. Shares husband can make unkind comments towards her as well as lie concerning state of finances. Shares for home to hold mold in it and concerned for her health.Shares husband can become irritable and angry which triggers feelings of depression. Shares minimal engagement outside of the home and socially. 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 X 4 pets provide support. 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 Denies SI/HI/AVH.   Suicidal/Homicidal: Nowithout intent/plan  Therapist Response: Therapist engaged Adelie in tele-therapy session. Confirmed location and ability to hold confidential session. Completed check in and assessed for current level of functioning, sxs management and level of functioning. Engaged Tasmine in open-ended questions to explore severity and intensity of sxs. Engaged in report building; providing support and encouragement; validated feelings. Provided safe space for Tayona to share thoughts and concerns regarding stressors. Reviewed previous engagement in therapy and areas of  progress and areas of continued  need for improvement. Engaged in treatment planning and explored what progress would look like. Encouraged ongoing following up with lawyers for disability case. Reviewed informed consent and bounds of confidentiality. Completion of GAD, PHQ, CSSRS and nutrition. Assessed for safety concerns.   GAD: 20 PHQ: 23  Plan: Return again in x1 weeks.  Diagnosis: Major depressive disorder, recurrent episode, moderate (HCC)  Generalized anxiety disorder  Collaboration of Care: Other None  Patient/Guardian was advised Release of Information must be obtained prior to any record release in order to collaborate their care with an outside provider. Patient/Guardian was advised if they have not already done so to contact the registration department to sign all necessary forms in order for [place] to release information regarding their care.   Consent: Patient/Guardian gives verbal consent for treatment and assignment of benefits for services provided during this visit. Patient/Guardian expressed understanding and agreed to proceed.   I provided 54 minutes of non-face-to-face time during this encounter.  [name] [place], [facility] 01/07/2022

## 2022-01-07 NOTE — Plan of Care (Signed)
  Problem: Depression CCP Problem  1 Coping with Depression Goal: LTG: Reduce frequency, intensity, and duration of depression symptoms as evidenced by score of 5 or less on PHQ-9 Outcome: Initial Goal: STG: Catherine Koch decrease sxs of depression AEB development of x 3 effective coping skills with ability to reframe maladaptive thinking patterns 7/7 days or the next x 6 months  Outcome: Initial   Problem: Anxiety Disorder CCP Problem  1 GAD Goal: LTG: Patient will score less than 5 on the Generalized Anxiety Disorder 7 Scale (GAD-7) Outcome: Initial Goal: STG: Catherine Koch will increase management of stress and anxiety AEB development of problem solving skills with use as needed and utilization of x 3 distress tolerance skills within the next x 6 months  Outcome: Initial Goal: STG: Catherine Koch will decrease sxs of anxiety AEB engagement and practice in relaxation and grounding coping skills 7/7 days for the next 6 months.  Outcome: Initial

## 2022-01-11 ENCOUNTER — Ambulatory Visit (HOSPITAL_COMMUNITY): Payer: No Payment, Other | Admitting: Mental Health

## 2022-01-11 ENCOUNTER — Ambulatory Visit (HOSPITAL_COMMUNITY): Payer: Self-pay | Admitting: Licensed Clinical Social Worker

## 2022-01-11 ENCOUNTER — Encounter (HOSPITAL_COMMUNITY): Payer: Self-pay

## 2022-01-11 NOTE — Progress Notes (Signed)
Therapist sent link for video session x 2. Contacted pt. Cell phone. No answer. Left message to reschedule therapy appointment.

## 2022-01-15 ENCOUNTER — Ambulatory Visit (INDEPENDENT_AMBULATORY_CARE_PROVIDER_SITE_OTHER): Payer: Self-pay | Admitting: Adult Health

## 2022-01-15 ENCOUNTER — Encounter: Payer: Self-pay | Admitting: Adult Health

## 2022-01-15 DIAGNOSIS — G4733 Obstructive sleep apnea (adult) (pediatric): Secondary | ICD-10-CM

## 2022-01-15 NOTE — Progress Notes (Signed)
@Patient  ID: , female    DOB: Feb 27, 1976, 46 y.o.   MRN: [id number]  Chief Complaint  Patient presents with   Follow-up    Referring provider: [id number], Maretta Bees  HPI: 46 year old female followed for sleep apnea  TEST/EVENTS : NPSG 01/24/2012-severe obstructive sleep apnea. AHI 62 per hour. CPAP titration to 10 CWP/AHI 1.4 per hour   01/15/2022 Follow up ; OSA  Patient presents for a follow-up visit for sleep apnea.  Last seen October 2021.  Patient has underlying severe sleep apnea.  Patient says that she wears her CPAP every single night.  Usually gets about 6  to 8 hours of sleep each night.  Says can not sleep without her CPAP . Feels rested and feels that she benefits from CPAP.  Patient says she did buy a new CPAP machine online 2 years ago.   She did bring in her SD card in today.  Unfortunately does not have any recorded data. Uses full face mask. Needs new supplies, buys from [place]. Has no insurance . Applying for disability.  Still has some daytime sleepiness.  Follows with Psych, recently started Strattera to help ADD.  Use [place] med assist to help with med cost.    Allergies  Allergen Reactions   Sulfa Antibiotics Anaphylaxis, Swelling and Rash    Throat swelling/ rashes    Immunization History  Administered Date(s) Administered   Influenza Split 02/24/2012   Influenza,inj,Quad PF,6+ Mos 05/25/2013, 02/26/2020   PFIZER(Purple Top)SARS-COV-2 Vaccination 08/18/2019, 09/08/2019    Past Medical History:  Diagnosis Date   Anxiety    Arthritis    pt. states in knees   Chronic gastritis    Chronic sinus infection    Closed fracture of right distal fibula 11/19/2013   Complication of anesthesia    pt. states difficult to wake up   Depression    GERD (gastroesophageal reflux disease)    Headache    pt. states random migraines   Hemorrhoids    High cholesterol    History of bronchitis    History of degenerative disc disease    Hypertension     Ovarian failure    PTSD (post-traumatic stress disorder)    Shortness of breath    exertion from crutches   Sleep apnea    cpap   UTI (lower urinary tract infection)    Wears glasses     Tobacco History: Social History   Tobacco Use  Smoking Status Former   Types: Cigarettes   Quit date: 05/24/2010   Years since quitting: 11.6   Passive exposure: Never  Smokeless Tobacco Never  Tobacco Comments   Pt. states she used to be a social smoker   Counseling given: Not Answered Tobacco comments: Pt. states she used to be a social smoker   Outpatient Medications Prior to Visit  Medication Sig Dispense Refill   acetaminophen (TYLENOL) 500 MG tablet Take 1,000 mg by mouth every 6 (six) hours as needed for fever.     atomoxetine (STRATTERA) 40 MG capsule Take 1 capsule (40 mg total) by mouth daily. 30 capsule 3   diclofenac (VOLTAREN) 75 MG EC tablet Take 75 mg by mouth 2 (two) times daily.     DULoxetine HCl 40 MG CPEP Take 80 mg by mouth daily. 60 capsule 3   hydrOXYzine (ATARAX) 25 MG tablet Take 1 tablet (25 mg total) by mouth 3 (three) times daily. 90 tablet 3   metFORMIN (GLUMETZA) 500 MG (MOD) 24 hr  tablet Take 500 mg by mouth daily with breakfast.     metoprolol tartrate (LOPRESSOR) 25 MG tablet Take 25 mg by mouth 2 (two) times daily.     omeprazole (PRILOSEC) 40 MG capsule Take 1 capsule (40 mg total) by mouth daily. 90 capsule 3   spironolactone (ALDACTONE) 25 MG tablet Take 25 mg by mouth daily.     traZODone (DESYREL) 100 MG tablet Take 1-1.5 tablets (100-150 mg total) by mouth at bedtime as needed for sleep. 45 tablet 3   Turmeric (QC TUMERIC COMPLEX PO) Take 1 tablet by mouth daily.     methocarbamol (ROBAXIN) 500 MG tablet Take 2 tablets (1,000 mg total) by mouth 2 (two) times daily. (Patient not taking: Reported on 01/15/2022) 20 tablet 0   misoprostol (CYTOTEC) 200 MCG tablet Take 200 mcg by mouth 2 (two) times daily as needed (stomach ulcers). (Patient not taking:  Reported on 10/13/2021)     ondansetron (ZOFRAN ODT) 4 MG disintegrating tablet Take 1 tablet (4 mg total) by mouth every 8 (eight) hours as needed for nausea or vomiting. (Patient not taking: Reported on 01/15/2022) 8 tablet 0   No facility-administered medications prior to visit.     Review of Systems:   Constitutional:   No  weight loss, night sweats,  Fevers, chills, + fatigue, or  lassitude.  HEENT:   No headaches,  Difficulty swallowing,  Tooth/dental problems, or  Sore throat,                No sneezing, itching, ear ache, nasal congestion, post nasal drip,   CV:  No chest pain,  Orthopnea, PND, swelling in lower extremities, anasarca, dizziness, palpitations, syncope.   GI  No heartburn, indigestion, abdominal pain, nausea, vomiting, diarrhea, change in bowel habits, loss of appetite, bloody stools.   Resp: No shortness of breath with exertion or at rest.  No excess mucus, no productive cough,  No non-productive cough,  No coughing up of blood.  No change in color of mucus.  No wheezing.  No chest wall deformity  Skin: no rash or lesions.  GU: no dysuria, change in color of urine, no urgency or frequency.  No flank pain, no hematuria   MS:  No joint pain or swelling.  No decreased range of motion.  No back pain.    Physical Exam  BP 132/80 (BP Location: Left Arm, Cuff Size: Normal)   Pulse 69   Ht 5\' 8"  (1.727 m)   Wt (!) 364 lb 6.4 oz (165.3 kg)   SpO2 98%   BMI 55.41 kg/m   GEN: A/Ox3; pleasant , NAD, well nourished    HEENT:  [place]/AT,  NOSE-clear, THROAT-clear, no lesions, no postnasal drip or exudate noted.  Class II-III MP airway  NECK:  Supple w/ fair ROM; no JVD; normal carotid impulses w/o bruits; no thyromegaly or nodules palpated; no lymphadenopathy.    RESP  Clear  P & A; w/o, wheezes/ rales/ or rhonchi. no accessory muscle use, no dullness to percussion  CARD:  RRR, no m/r/g, no peripheral edema, pulses intact, no cyanosis or clubbing.  GI:   Soft &  nt; nml bowel sounds; no organomegaly or masses detected.   Musco: Warm bil, no deformities or joint swelling noted.   Neuro: alert, no focal deficits noted.    Skin: Warm, no lesions or rashes    Lab Results:    BMET   BNP No results found for: "BNP"  ProBNP No results found for: "  PROBNP"  Imaging: No results found.        No data to display          No results found for: "NITRICOXIDE"      Assessment & Plan:   Obstructive sleep apnea Severe obstructive sleep apnea.  Patient is on a CPAP machine.  Unfortunately were unable to get a CPAP download.  Would like to see how her control is she says she has excellent compliance.  Have advised her to see if her CPAP machine will link with her phone with a CPAP app.  Also advised her to get another SD card to see if we can do a download in 30 days. She does not have insurance and is not connected to a local DME company unable to download her machine She is been followed by psychiatry for ADD and hypersomnia.  Unclear what is causing her hypersomnia, with to get a CPAP download.  Have advised her continue follow-up with psychiatry and may continue on Strattera to see if this helps with her symptom burden.  - discussed how weight can impact sleep and risk for sleep disordered breathing - discussed options to assist with weight loss: combination of diet modification, cardiovascular and strength training exercises   - had an extensive discussion regarding the adverse health consequences related to untreated sleep disordered breathing - specifically discussed the risks for hypertension, coronary artery disease, cardiac dysrhythmias, cerebrovascular disease, and diabetes - lifestyle modification discussed   - discussed how sleep disruption can increase risk of accidents, particularly when driving - safe driving practices were discussed   Plan  Patient Instructions  Try to link CPAP to your phone to see if we can get  download this way, try new SD card .  Bring new SD card in 1 month for download.  Continue on CPAP At bedtime  , wear all night long and with naps Healthy sleep regimen  Do not drive if sleepy  Work on healthy weight loss.  Follow up in 4-6 months and As needed         Rubye Oaks, NP 01/15/2022

## 2022-01-15 NOTE — Patient Instructions (Addendum)
Try to link CPAP to your phone to see if we can get download this way, try new SD card .  Bring new SD card in 1 month for download.  Continue on CPAP At bedtime  , wear all night long and with naps Healthy sleep regimen  Do not drive if sleepy  Work on healthy weight loss.  Follow up in 4-6 months with Dr. Vassie Loll  and As needed

## 2022-01-15 NOTE — Assessment & Plan Note (Addendum)
Severe obstructive sleep apnea.  Patient is on a CPAP machine.  Unfortunately were unable to get a CPAP download.  Would like to see how her control is she says she has excellent compliance.  Have advised her to see if her CPAP machine will link with her phone with a CPAP app.  Also advised her to get another SD card to see if we can do a download in 30 days. She does not have insurance and is not connected to a local DME company unable to download her machine She is been followed by psychiatry for ADD and hypersomnia.  Unclear what is causing her hypersomnia, with to get a CPAP download.  Have advised her continue follow-up with psychiatry and may continue on Strattera to see if this helps with her symptom burden.  - discussed how weight can impact sleep and risk for sleep disordered breathing - discussed options to assist with weight loss: combination of diet modification, cardiovascular and strength training exercises   - had an extensive discussion regarding the adverse health consequences related to untreated sleep disordered breathing - specifically discussed the risks for hypertension, coronary artery disease, cardiac dysrhythmias, cerebrovascular disease, and diabetes - lifestyle modification discussed   - discussed how sleep disruption can increase risk of accidents, particularly when driving - safe driving practices were discussed   Plan  Patient Instructions  Try to link CPAP to your phone to see if we can get download this way, try new SD card .  Bring new SD card in 1 month for download.  Continue on CPAP At bedtime  , wear all night long and with naps Healthy sleep regimen  Do not drive if sleepy  Work on healthy weight loss.  Follow up in 4-6 months with Dr. Vassie Loll  and As needed

## 2022-01-27 ENCOUNTER — Ambulatory Visit (HOSPITAL_COMMUNITY): Payer: Self-pay | Admitting: Licensed Clinical Social Worker

## 2022-01-27 ENCOUNTER — Ambulatory Visit (INDEPENDENT_AMBULATORY_CARE_PROVIDER_SITE_OTHER): Payer: No Payment, Other | Admitting: Mental Health

## 2022-01-27 DIAGNOSIS — F331 Major depressive disorder, recurrent, moderate: Secondary | ICD-10-CM | POA: Diagnosis not present

## 2022-01-27 NOTE — Progress Notes (Signed)
THERAPIST PROGRESS NOTE Virtual Visit via Video Note  I connected with Catherine Koch on 01/27/22 at 10:00 AM EDT by a video enabled telemedicine application and verified that I am speaking with the correct person using two identifiers.  Location: Patient: home address on file Provider: office    I discussed the limitations of evaluation and management by telemedicine and the availability of in person appointments. The patient expressed understanding and agreed to proceed.  I discussed the assessment and treatment plan with the patient. The patient was provided an opportunity to ask questions and all were answered. The patient agreed with the plan and demonstrated an understanding of the instructions.   The patient was advised to call back or seek an in-person evaluation if the symptoms worsen or if the condition fails to improve as anticipated.  I provided 45 minutes of non-face-to-face time during this encounter.   [name], [facility]   Session Time: 10:06am ( 45 minutes)  Participation Level: Active  Behavioral Response: CasualAlertDepressed  Type of Therapy: Individual Therapy  Treatment Goals addressed: [name] decrease sxs of depression AEB development of x 3 effective coping skills with ability to reframe maladaptive thinking patterns 7/7 days or the next x 6 months  ProgressTowards Goals: Progressing  Interventions: CBT and Supportive  Summary: Catherine Koch is a 46 y.o. female who presents with dx of depression and anxiety. Catherine Koch presents alert and oriented; mood and affect low. Engaged and receptive to interventions. Catherine Koch shares ongoing low mood with worry for her health, finances and recent visit with mother and sister. Shares history of sister and mother to have always been close but was not able to build close bond with mother. Shares for mother to have called her names and failed to be supportive of her and making comments she would not amount to be  anything. Shares mother feels Catherine Koch should work and does not feel medical concerns are disabling. Catherine Koch shares to easily get winded and has day time drowsiness along with other medical concerns. Shares history of being able to engage in in routine and able to process working to set small goals to return to increase in daily routine as means of coping with depression. Shares cats also support in coping with depression and managing sxs. Shares for husband to encouraged her to get out the house more but shares can't walk far and going out can be draining and difficulty with motivation to get up and get dressed. ABle to process ways in which engaging outside and in community can support with decreasing depressive sxs and increasing quality of life. Agrees to explore increased routine for ADLs and engaging in community at high rate.   Suicidal/Homicidal: Nowithout intent/plan  Therapist Response: Therapist engaged Catherine Koch in tele-therapy session. Confirmed location and ability to hold confidential session. Completed check in and assessed for current level of functioning, sxs management and level of functioning. Engaged Catherine Koch in exploring feelings of depression and factors that contribute. Supported in processing thoughts and feelings related to relationship with mother. Engaged in guided discovery to explore alternative thoughts vs focusing on things in which she can not do to reframe negative thinking patterns aligned with black and white thinking.  Explored working to increase routine and engaging in community at higher rate and processed ways this can increase quality of life and behavioral activation for increase level of functioning.   Plan: Return again in  x 2 weeks.  Diagnosis: Major depressive disorder, recurrent episode, moderate (HCC)  Collaboration of Care: Other None  Patient/Guardian was advised Release of Information must be obtained prior to any record release in order to collaborate their  care with an outside provider. Patient/Guardian was advised if they have not already done so to contact the registration department to sign all necessary forms in order for [place] to release information regarding their care.   Consent: Patient/Guardian gives verbal consent for treatment and assignment of benefits for services provided during this visit. Patient/Guardian expressed understanding and agreed to proceed.   [name] [place], [facility] 01/27/2022

## 2022-01-27 NOTE — Plan of Care (Signed)
  Problem: Depression CCP Problem  1 Coping with Depression Goal: STG: Stephanne decrease sxs of depression AEB development of x 3 effective coping skills with ability to reframe maladaptive thinking patterns 7/7 days or the next x 6 months  Outcome: Progressing   Problem: Depression CCP Problem  1 Coping with Depression Goal: LTG: Reduce frequency, intensity, and duration of depression symptoms as evidenced by score of 5 or less on PHQ-9 Outcome: Not Progressing   Problem: Anxiety Disorder CCP Problem  1 GAD Goal: LTG: Patient will score less than 5 on the Generalized Anxiety Disorder 7 Scale (GAD-7) Outcome: Not Progressing Goal: STG: Laelle will increase management of stress and anxiety AEB development of problem solving skills with use as needed and utilization of x 3 distress tolerance skills within the next x 6 months  Outcome: Not Progressing Goal: STG: Antoniette will decrease sxs of anxiety AEB engagement and practice in relaxation and grounding coping skills 7/7 days for the next 6 months.  Outcome: Not Progressing   

## 2022-02-02 ENCOUNTER — Encounter (HOSPITAL_COMMUNITY): Payer: Self-pay | Admitting: Psychiatry

## 2022-02-02 ENCOUNTER — Telehealth (INDEPENDENT_AMBULATORY_CARE_PROVIDER_SITE_OTHER): Payer: No Payment, Other | Admitting: Psychiatry

## 2022-02-02 DIAGNOSIS — F900 Attention-deficit hyperactivity disorder, predominantly inattentive type: Secondary | ICD-10-CM | POA: Diagnosis not present

## 2022-02-02 DIAGNOSIS — F330 Major depressive disorder, recurrent, mild: Secondary | ICD-10-CM

## 2022-02-02 DIAGNOSIS — F411 Generalized anxiety disorder: Secondary | ICD-10-CM | POA: Diagnosis not present

## 2022-02-02 MED ORDER — ATOMOXETINE HCL 40 MG PO CAPS: 40.0000 mg | ORAL_CAPSULE | Freq: Every day | ORAL | 3 refills | Status: DC

## 2022-02-02 MED ORDER — DULOXETINE HCL 40 MG PO CPEP: 80.0000 mg | ORAL_CAPSULE | Freq: Every day | ORAL | 3 refills | Status: DC

## 2022-02-02 MED ORDER — TRAZODONE HCL 100 MG PO TABS: 100.0000 mg | ORAL_TABLET | Freq: Every evening | ORAL | 3 refills | Status: DC | PRN

## 2022-02-02 MED ORDER — HYDROXYZINE HCL 25 MG PO TABS: 25.0000 mg | ORAL_TABLET | Freq: Three times a day (TID) | ORAL | 3 refills | Status: DC

## 2022-02-02 NOTE — Progress Notes (Signed)
BH MD/PA/NP OP Progress Note Virtual Visit via Video Note  I connected with Catherine Koch on 02/02/22 at  2:30 PM EDT by a video enabled telemedicine application and verified that I am speaking with the correct person using two identifiers.  Location: Patient: Home Provider: Clinic   I discussed the limitations of evaluation and management by telemedicine and the availability of in person appointments. The patient expressed understanding and agreed to proceed.  I provided 30 minutes of non-face-to-face time during this encounter.    02/02/2022 3:06 PM Catherine Koch  MRN:  [id number]  Chief Complaint: "I have good and bad day"   HPI: 46 year old female seen today  for follow-up psychiatric evaluation. She has a history of auditory processing disorder (as a child). depression, PTSD,  and anxiety, and is currently managed on Hydroxyzine 25mg  three times daily as needed, Strattera 40 mg Cymbalta 80 mg daily, and Trazodone 150 mg at bedtime. She informed that she discontinued Strattera due to interactions she believed it had with metformin.    Today she was well groomed, pleasant, cooperative, and engaged in conversation. She notes that she has good days and bad days. She reports her physical comorbidities are exacerbating her mental health. She notes at she has had increasing pain in her back and neck. She also notes that she has issues sleeping due to her pain. She also has sleep apnea and reports that she has been waking up with a headache.   Patient informed Clinical research associate that she discontinued Strattera because she felt that it in combination with metformin was causing side effects. She reports that she continue to have symptoms of ADHD noting that she has been distractible, forgetful, inattentive to mentally taxing task, and often is disorganized.  Patient informed Clinical research associate that she would like to Clinical research associate as she is no longer on metformin.  Patient informed writer that the above  stressors exacerbate her anxiety and depression.  Provider conducted a GAD-7 to be scored an 18, at her last visit she scored a 15.  Provider also conducted PHQ-9 and patient scored an 21, at her last visit she scored a 18.  She endorses fluctuations in appetite and sleep.  Patient notes that she sleeps 8 to 9 hours but notes that she naps in the day.  Today she denies SI/HI/VAH, mania, paranoia.  Today patient is agreeable to restarting Strattera 40 mg to help manage symptoms of ADHD.  At this time she does not want other medications adjusted as she feels that her physical comorbidities is causing her mental issues.  She will continue all other medications as prescribed.  No other concerns at this time.     Visit Diagnosis:    ICD-10-CM   1. Mild episode of recurrent major depressive disorder (HCC)  F33.0 traZODone (DESYREL) 100 MG tablet    DULoxetine HCl 40 MG CPEP    2. Generalized anxiety disorder  F41.1 hydrOXYzine (ATARAX) 25 MG tablet    3. Attention deficit hyperactivity disorder (ADHD), predominantly inattentive type  F90.0 atomoxetine (STRATTERA) 40 MG capsule      Past Psychiatric History: Auditory processing disorder (as a child), PTSD, anxiety, and depression  Past Medical History:  Past Medical History:  Diagnosis Date   Anxiety    Arthritis    pt. states in knees   Chronic gastritis    Chronic sinus infection    Closed fracture of right distal fibula 11/19/2013   Complication of anesthesia    pt.  states difficult to wake up   Depression    GERD (gastroesophageal reflux disease)    Headache    pt. states random migraines   Hemorrhoids    High cholesterol    History of bronchitis    History of degenerative disc disease    Hypertension    Ovarian failure    PTSD (post-traumatic stress disorder)    Shortness of breath    exertion from crutches   Sleep apnea    cpap   UTI (lower urinary tract infection)    Wears glasses     Past Surgical History:  Procedure  Laterality Date   BIOPSY  12/10/2019   Procedure: BIOPSY;  Surgeon: Napoleon Form, MD;  Location: [facility];  Service: Endoscopy;;   CHOLECYSTECTOMY  2010   CHOLECYSTECTOMY     COLONOSCOPY WITH PROPOFOL N/A 12/10/2019   Procedure: COLONOSCOPY WITH PROPOFOL;  Surgeon: Napoleon Form, MD;  [facility];  Service: Endoscopy;  Laterality: N/A;   ESOPHAGOGASTRODUODENOSCOPY     ESOPHAGOGASTRODUODENOSCOPY (EGD) WITH PROPOFOL N/A 12/10/2019   Procedure: ESOPHAGOGASTRODUODENOSCOPY (EGD) WITH PROPOFOL;  Surgeon: Napoleon Form, MD;  [facility];  Service: Endoscopy;  Laterality: N/A;   NASAL SEPTOPLASTY W/ TURBINOPLASTY Bilateral 12/11/2014   Procedure: NASAL SEPTOPLASTY AND BILATERAL INFERIOR TURBINATE REDUCTION;  Surgeon: Osborn Coho, MD;  Location: [facility];  Service: ENT;  Laterality: Bilateral;   ORIF FIBULA FRACTURE Right 11/19/2013   Procedure: OPEN REDUCTION INTERNAL FIXATION (ORIF) RIGHT FIBULA FRACTURE;  Surgeon: Eulas Post, MD;  Location: [facility] OR;  Service: Orthopedics;  Laterality: Right;   WISDOM TOOTH EXTRACTION      Family Psychiatric History: Sister anxiety and depression. Notes mother has untreated mental health conditions  Family History:  Family History  Problem Relation Age of Onset   Depression Sister    Allergies Sister    Cancer Paternal Grandfather    Heart disease Paternal Grandmother    Cancer Paternal Grandmother        BREAST CANCER    Social History:  Social History   Socioeconomic History   Marital status: Married    Spouse name: Not on file   Number of children: 0   Years of education: Not on file   Highest education level: Some college, no degree  Occupational History    Comment: UNEMPOLYEED   Tobacco Use   Smoking status: Former    Types: Cigarettes    Quit date: 05/24/2010    Years since quitting: 11.7    Passive exposure: Never   Smokeless tobacco: Never   Tobacco comments:    Pt. states she used to be a  social smoker  Vaping Use   Vaping Use: Never used  Substance and Sexual Activity   Alcohol use: Yes   Drug use: No   Sexual activity: Yes    Birth control/protection: None  Other Topics Concern   Not on file  Social History Narrative   Not on file   Social Determinants of Health   Financial Resource Strain: High Risk (01/06/2022)   Overall Financial Resource Strain (CARDIA)    Difficulty of Paying Living Expenses: Hard  Food Insecurity: Food Insecurity Present (01/06/2022)   Hunger Vital Sign    Worried About Running Out of Food in the Last Year: Sometimes true    Ran Out of Food in the Last Year: Sometimes true  Transportation Needs: No Transportation Needs (01/06/2022)   PRAPARE - Administrator, Civil Service (Medical): No  Lack of Transportation (Non-Medical): No  Physical Activity: Inactive (01/06/2022)   Exercise Vital Sign    Days of Exercise per Week: 0 days    Minutes of Exercise per Session: 0 min  Stress: Stress Concern Present (01/06/2022)   [employer] of Occupational Health - Occupational Stress Questionnaire    Feeling of Stress : Very much  Social Connections: Socially Isolated (01/06/2022)   Social Connection and Isolation Panel [NHANES]    Frequency of Communication with Friends and Family: Once a week    Frequency of Social Gatherings with Friends and Family: Never    Attends Religious Services: Never    Database administrator or Organizations: No    Attends Banker Meetings: Never    Marital Status: Married    Allergies:  Allergies  Allergen Reactions   Sulfa Antibiotics Anaphylaxis, Swelling and Rash    Throat swelling/ rashes    Metabolic Disorder Labs: No results found for: "HGBA1C", "MPG" No results found for: "PROLACTIN" No results found for: "CHOL", "TRIG", "HDL", "CHOLHDL", "VLDL", "LDLCALC" Lab Results  Component Value Date   TSH 1.90 04/11/2012    Therapeutic Level Labs: No results found for:  "LITHIUM" No results found for: "VALPROATE" No results found for: "CBMZ"  Current Medications: Current Outpatient Medications  Medication Sig Dispense Refill   acetaminophen (TYLENOL) 500 MG tablet Take 1,000 mg by mouth every 6 (six) hours as needed for fever.     atomoxetine (STRATTERA) 40 MG capsule Take 1 capsule (40 mg total) by mouth daily. 30 capsule 3   diclofenac (VOLTAREN) 75 MG EC tablet Take 75 mg by mouth 2 (two) times daily.     DULoxetine HCl 40 MG CPEP Take 80 mg by mouth daily. 60 capsule 3   hydrOXYzine (ATARAX) 25 MG tablet Take 1 tablet (25 mg total) by mouth 3 (three) times daily. 90 tablet 3   metFORMIN (GLUMETZA) 500 MG (MOD) 24 hr tablet Take 500 mg by mouth daily with breakfast.     methocarbamol (ROBAXIN) 500 MG tablet Take 2 tablets (1,000 mg total) by mouth 2 (two) times daily. (Patient not taking: Reported on 01/15/2022) 20 tablet 0   metoprolol tartrate (LOPRESSOR) 25 MG tablet Take 25 mg by mouth 2 (two) times daily.     misoprostol (CYTOTEC) 200 MCG tablet Take 200 mcg by mouth 2 (two) times daily as needed (stomach ulcers). (Patient not taking: Reported on 10/13/2021)     omeprazole (PRILOSEC) 40 MG capsule Take 1 capsule (40 mg total) by mouth daily. 90 capsule 3   ondansetron (ZOFRAN ODT) 4 MG disintegrating tablet Take 1 tablet (4 mg total) by mouth every 8 (eight) hours as needed for nausea or vomiting. (Patient not taking: Reported on 01/15/2022) 8 tablet 0   spironolactone (ALDACTONE) 25 MG tablet Take 25 mg by mouth daily.     traZODone (DESYREL) 100 MG tablet Take 1-1.5 tablets (100-150 mg total) by mouth at bedtime as needed for sleep. 45 tablet 3   Turmeric (QC TUMERIC COMPLEX PO) Take 1 tablet by mouth daily.     No current facility-administered medications for this visit.     Musculoskeletal: Strength & Muscle Tone: within normal limits and telehealth visit Gait & Station: normal, telehealth visit Patient leans: N/A  Psychiatric Specialty  Exam: Review of Systems  There were no vitals taken for this visit.There is no height or weight on file to calculate BMI.  General Appearance: Well Groomed  Eye Contact:  Good  Speech:  Clear and Coherent and Normal Rate  Volume:  Normal  Mood:  Anxious and Depressed   Affect:  Congruent  Thought Process:  Coherent, Goal Directed and Linear  Orientation:  Full (Time, Place, and Person)  Thought Content: WDL and Logical   Suicidal Thoughts:  No  Homicidal Thoughts:  No  Memory:  Immediate;   Good Recent;   Good Remote;   Good  Judgement:  Good  Insight:  Good  Psychomotor Activity:  Normal  Concentration:  Concentration: Fair and Attention Span: Fair  Recall:  Good  Fund of Knowledge: Good  Language: Good  Akathisia:  No  Handed:  Right  AIMS (if indicated): Not done  Assets:  Communication Skills Desire for Improvement Financial Resources/Insurance Housing Intimacy Social Support  ADL's:  Intact  Cognition: WNL  Sleep:  Good   Screenings: GAD-7    Flowsheet Row Video Visit from 02/02/2022 in [facility] Counselor from 01/06/2022 in [facility] Video Visit from 11/04/2021 in [facility] Counselor from 10/05/2021 in [facility] Video Visit from 05/22/2021 in [facility]  Total GAD-7 Score 18 20 15 9 13          Flowsheet Row Video Visit from 02/02/2022 in [facility] Counselor from 01/06/2022 in [facility] Video Visit from 11/04/2021 in [facility] Counselor from 10/13/2021 in [facility] Counselor from 10/05/2021 in [facility]  PHQ-2 Total Score 5 5 4 4 3   PHQ-9 Total Score 21 23 18 19 14       Flowsheet Row Video Visit from 02/02/2022 in [facility]  Counselor from 01/06/2022 in [facility] ED from 01/02/2022 in [facility]  C-SSRS RISK CATEGORY No Risk Low Risk No Risk        Assessment and Plan: Patient endorses symptoms of anxiety, depression, and ADHD.  Today patient is agreeable to restarting Strattera 40 mg to help manage symptoms of ADHD.  At this time she does not want other medications adjusted as she feels that her physical comorbidities is causing her mental issues.  She will continue all other medications as prescribed. 1. Generalized anxiety disorder  Continue- hydrOXYzine (ATARAX) 25 MG tablet; Take 1 tablet (25 mg total) by mouth 3 (three) times daily.  Dispense: 90 tablet; Refill: 3  2. Mild episode of recurrent major depressive disorder (HCC)  Continue- traZODone (DESYREL) 100 MG tablet; Take 1-1.5 tablets (100-150 mg total) by mouth at bedtime as needed for sleep.  Dispense: 45 tablet; Refill: 3 Continue- DULoxetine HCl 40 MG CPEP; Take 80 mg by mouth daily.  Dispense: 60 capsule; Refill: 3  3. Attention deficit hyperactivity disorder (ADHD), predominantly inattentive type  Retart- atomoxetine (STRATTERA) 40 MG capsule; Take 1 capsule (40 mg total) by mouth daily.  Dispense: 30 capsule; Refill: 3   Follow-up in 3 months Follow-up with therapy   01/08/2022, NP 02/02/2022, 3:06 PM

## 2022-02-05 ENCOUNTER — Telehealth (HOSPITAL_COMMUNITY): Payer: Self-pay | Admitting: *Deleted

## 2022-02-05 NOTE — Telephone Encounter (Signed)
Fax received from [facility] on this patient stating they have made several unsuccessful attempts to reach her and make and appt. Asking this clinic?Dr Doyne Keel to reach out and ask patient to call [facility] to schedule an appt. Will forward this message to Toy Cookey DNP to be made aware.

## 2022-02-09 ENCOUNTER — Encounter: Payer: Self-pay | Admitting: Gastroenterology

## 2022-02-09 ENCOUNTER — Ambulatory Visit (INDEPENDENT_AMBULATORY_CARE_PROVIDER_SITE_OTHER): Payer: Self-pay | Admitting: Gastroenterology

## 2022-02-09 VITALS — BP 130/72 | HR 87 | Ht 68.0 in | Wt 362.4 lb

## 2022-02-09 DIAGNOSIS — K219 Gastro-esophageal reflux disease without esophagitis: Secondary | ICD-10-CM

## 2022-02-09 DIAGNOSIS — Z9049 Acquired absence of other specified parts of digestive tract: Secondary | ICD-10-CM

## 2022-02-09 DIAGNOSIS — K581 Irritable bowel syndrome with constipation: Secondary | ICD-10-CM

## 2022-02-09 DIAGNOSIS — K760 Fatty (change of) liver, not elsewhere classified: Secondary | ICD-10-CM

## 2022-02-09 MED ORDER — COLESEVELAM HCL 625 MG PO TABS: 625.0000 mg | ORAL_TABLET | Freq: Every day | ORAL | 3 refills | Status: AC

## 2022-02-09 MED ORDER — OMEPRAZOLE 40 MG PO CPDR: 40.0000 mg | DELAYED_RELEASE_CAPSULE | Freq: Every day | ORAL | 3 refills | Status: DC
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
## 2022-02-09 NOTE — Patient Instructions (Signed)
We have sent the following medications to your pharmacy for you to pick up at your convenience: Omeprazole and Welchol.   _______________________________________________________  If you are age 44 or older, your body mass index should be between 23-30. Your Body mass index is 55.1 kg/m. If this is out of the aforementioned range listed, please consider follow up with your Primary Care Provider.  If you are age 26 or younger, your body mass index should be between 19-25. Your Body mass index is 55.1 kg/m. If this is out of the aformentioned range listed, please consider follow up with your Primary Care Provider.   ________________________________________________________  The [facility] providers would like to encourage you to use [facility] to communicate with providers for non-urgent requests or questions.  Due to long hold times on the telephone, sending your provider a message by [facility] may be a faster and more efficient way to get a response.  Please allow 48 business hours for a response.  Please remember that this is for non-urgent requests.  _______________________________________________________

## 2022-02-09 NOTE — Telephone Encounter (Signed)
Provider called patient however received her voicemail.  Provider left message for patient to call [facility] [phone] to schedule an appointment.

## 2022-02-10 ENCOUNTER — Ambulatory Visit (INDEPENDENT_AMBULATORY_CARE_PROVIDER_SITE_OTHER): Payer: No Payment, Other | Admitting: Mental Health

## 2022-02-10 DIAGNOSIS — F411 Generalized anxiety disorder: Secondary | ICD-10-CM

## 2022-02-10 DIAGNOSIS — F331 Major depressive disorder, recurrent, moderate: Secondary | ICD-10-CM | POA: Diagnosis not present

## 2022-02-10 NOTE — Plan of Care (Signed)
  Problem: Depression CCP Problem  1 Coping with Depression Goal: STG: Catherine Koch decrease sxs of depression AEB development of x 3 effective coping skills with ability to reframe maladaptive thinking patterns 7/7 days or the next x 6 months  Outcome: Progressing   Problem: Depression CCP Problem  1 Coping with Depression Goal: LTG: Reduce frequency, intensity, and duration of depression symptoms as evidenced by score of 5 or less on PHQ-9 Outcome: Not Progressing   Problem: Anxiety Disorder CCP Problem  1 GAD Goal: LTG: Patient will score less than 5 on the Generalized Anxiety Disorder 7 Scale (GAD-7) Outcome: Not Progressing Goal: STG: Catherine Koch will increase management of stress and anxiety AEB development of problem solving skills with use as needed and utilization of x 3 distress tolerance skills within the next x 6 months  Outcome: Not Progressing Goal: STG: Catherine Koch will decrease sxs of anxiety AEB engagement and practice in relaxation and grounding coping skills 7/7 days for the next 6 months.  Outcome: Not Progressing

## 2022-02-10 NOTE — Progress Notes (Signed)
THERAPIST PROGRESS NOTE Virtual Visit via Video Note  I connected with Catherine Koch on 02/10/22 at  1:00 PM EDT by a video enabled telemedicine application and verified that I am speaking with the correct person using two identifiers.  Location: Patient: home address on file Provider: office    I discussed the limitations of evaluation and management by telemedicine and the availability of in person appointments. The patient expressed understanding and agreed to proceed.  I discussed the assessment and treatment plan with the patient. The patient was provided an opportunity to ask questions and all were answered. The patient agreed with the plan and demonstrated an understanding of the instructions.   The patient was advised to call back or seek an in-person evaluation if the symptoms worsen or if the condition fails to improve as anticipated.  I provided 53 minutes of non-face-to-face time during this encounter.   Catherine Koch, [facility]   Session Time: 1:05pm (53 minutes)  Participation Level: Active  Behavioral Response: CasualLethargicDysphoric  Type of Therapy: Individual Therapy  Treatment Goals addressed: Catherine Koch decrease sxs of depression AEB development of x 3 effective coping skills with ability to reframe maladaptive thinking patterns 7/7 days or the next x 6 months   ProgressTowards Goals: Progressing  Interventions: CBT and Supportive  Summary:   Catherine Koch is a 46 y.o. female who presents with dx of depression and anxiety. Catherine Koch presents alert and oriented; mood and affect low. Engaged and receptive to interventions. Catherine Koch reports mood low secondary to increased pain from arthritis and degenerative disk and shares to be experiencing an episode. Catherine Koch presents laying down in bed. Reports for pain to be effecting her mood. States to have 'good and bad days' with thoughts of bad days to be more frequent lately. Shares frustration with inability to be  seen by pain management due to lack of funds and insurance. Reports dogs to be supportive in coping as well as online friendships and gaming. Shares desire to work but unable to with high degree of pain. Shares thought " I can't do anything." Able to engage with clinician and work to reframe thinking patterns and pulling in all evidence. Reports at times poor coping with food but has been working to make better food choices to support in loosing weight. No safety concerns reported.   Suicidal/Homicidal: Nowithout intent/plan  Therapist Response: Therapist engaged Catherine Koch in Catherine Koch session. Confirmed location and ability to hold confidential session. Completed check in and assessed for current level of functioning, sxs management and level of functioning. 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 Explored ability to cope and take care of medical needs. Supported in identifying distorted thinking and working explore alternative balanced thought. Encouraged use of coping skills to distract from pain and working to have good hours or moments within difficult pain days. Reviewed session, assessed for safety and provided follow up appointment.   Plan: Return again in  x2 weeks.  Diagnosis: Major depressive disorder, recurrent episode, moderate (HCC)  Generalized anxiety disorder  Collaboration of Care: Other None  Patient/Guardian was advised Release of Information must be obtained prior to any record release in order to collaborate their care with an outside provider. Patient/Guardian was advised if they have not already done so to contact the registration department to sign all necessary forms in order for [place] to release information regarding their care.   Consent: Patient/Guardian gives verbal consent for treatment and assignment of benefits for services provided  during this visit. Patient/Guardian expressed understanding and agreed to proceed.   [name]  [place], [facility] 02/10/2022

## 2022-02-19 NOTE — Psych (Signed)
Virtual Visit via Video Note  I connected with Catherine Koch on 10/22/21 at  9:00 AM EDT by a video enabled telemedicine application and verified that I am speaking with the correct person using two identifiers.  Location: Patient: patient home Provider: clinical home office   I discussed the limitations of evaluation and management by telemedicine and the availability of in person appointments. The patient expressed understanding and agreed to proceed.  I discussed the assessment and treatment plan with the patient. The patient was provided an opportunity to ask questions and all were answered. The patient agreed with the plan and demonstrated an understanding of the instructions.   The patient was advised to call back or seek an in-person evaluation if the symptoms worsen or if the condition fails to improve as anticipated.  Pt was provided 240 minutes of non-face-to-face time during this encounter.   Donia Guiles, LCSW    [facility] PHP THERAPIST PROGRESS NOTE  Catherine Koch [id number]  Session Time: 9:00 - 10:00  Participation Level: Active  Behavioral Response: CasualAlertDepressed  Type of Therapy: Group Therapy  Treatment Goals addressed: Coping  Progress Towards Goals: Progressing  Interventions: CBT, DBT, Supportive, and Reframing  Summary: Clinician led check-in regarding current stressors and situation, and review of patient completed daily inventory. Clinician utilized active listening and empathetic response and validated patient emotions. Clinician facilitated processing group on pertinent issues.?    Summary: Catherine Koch is a 46 y.o. female who presents with depression and anxiety symptoms. Patient arrived within time allowed.  Patient rates her mood at a 5 on a scale of 1-10 with 10 being best. Pt states she feels "tired." Pt reports physical health symptoms of back and head pain that interfered with her sleep. Pt reports attempting self care tasks.  Patient able to process. Patient engaged in discussion.        Session Time: 10:00 am - 11:00 am   Participation Level: Active   Behavioral Response: CasualAlertDepressed   Type of Therapy: Group Therapy   Treatment Goals addressed: Coping   Progress Towards Goals: Progressing   Interventions: CBT, DBT, Solution Focused, Strength-based, Supportive, and Reframing   Therapist Response: Cln led discussion on the 5 stages of grief and the way loss affects [place]. Cln encouraged pt to consider grief as a journey to accepting a new future. Cln created space for pt to process grief concerns and validated pt's experiences.    Therapist Response:  Pt engaged in discussion and was able to identify areas of loss and process.            Session Time: 11:00 -12:00   Participation Level: Active   Behavioral Response: CasualAlertDepressed   Type of Therapy: Group Therapy, Occupational Therapy   Treatment Goals addressed: Coping   Progress Towards Goals: Progressing   Interventions: Supportive, Education   Summary:  Occupational Therapy group led by cln E. Hollan.   Therapist Response: Pt participated         Session Time: 12:00 -1:00   Participation Level: Active   Behavioral Response: CasualAlertDepressed   Type of Therapy: Group therapy   Treatment Goals addressed: Coping   Progress Towards Goals: Progressing   Interventions: CBT; Solution focused; Supportive; Reframing   Summary: 12:00 - 12:50: Cln led discussion on "why" and "what now" in terms of how we focus on our problems. Group members shared how focus on "why" has impacted them and barriers to focusing on "what now." Group able to process. Cln  encouraged pt's to consider what "why" accomplishes.  12:50 -1:00 Clinician led check-out. Clinician assessed for immediate needs, medication compliance and efficacy, and safety concerns.   Therapist Response: 12:00 - 12:50: Pt engaged in discussion. 12:50 - 1:00 pm: At  check-out, patient reports no immediate concerns. Patient demonstrates progress as evidenced by increase in self care. Patient denies SI/HI/self-harm thoughts at the end of group.    Suicidal/Homicidal: Nowithout intent/plan  Plan: Pt will continue in PHP while working to decrease depression and anxiety symptoms, increase ability to manage stressors, and increase ability to manage symptoms in a healthy manner.   Collaboration of Care: Medication Management AEB T Lewis  Patient/Guardian was advised Release of Information must be obtained prior to any record release in order to collaborate their care with an outside provider. Patient/Guardian was advised if they have not already done so to contact the registration department to sign all necessary forms in order for [place] to release information regarding their care.   Consent: Patient/Guardian gives verbal consent for treatment and assignment of benefits for services provided during this visit. Patient/Guardian expressed understanding and agreed to proceed.   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 Generalized anxiety disorder      Lorin Glass, LCSW

## 2022-02-19 NOTE — Psych (Signed)
Virtual Visit via Video Note  I connected with [name] on 10/21/21 at  9:00 AM EDT by a video enabled telemedicine application and verified that I am speaking with the correct person using two identifiers.  Location: Patient: patient home Provider: clinical home office   I discussed the limitations of evaluation and management by telemedicine and the availability of in person appointments. The patient expressed understanding and agreed to proceed.  I discussed the assessment and treatment plan with the patient. The patient was provided an opportunity to ask questions and all were answered. The patient agreed with the plan and demonstrated an understanding of the instructions.   The patient was advised to call back or seek an in-person evaluation if the symptoms worsen or if the condition fails to improve as anticipated.  Pt was provided 240 minutes of non-face-to-face time during this encounter.   Lorin Glass, LCSW    [facility] PHP THERAPIST PROGRESS NOTE  Catherine Koch [id number]  Session Time: 9:00 - 10:00  Participation Level: Active  Behavioral Response: CasualAlertDepressed  Type of Therapy: Group Therapy  Treatment Goals addressed: Coping  Progress Towards Goals: Progressing  Interventions: CBT, DBT, Supportive, and Reframing  Summary: Clinician led check-in regarding current stressors and situation, and review of patient completed daily inventory. Clinician utilized active listening and empathetic response and validated patient emotions. Clinician facilitated processing group on pertinent issues.?    Summary: Catherine Koch is a 46 y.o. female who presents with depression and anxiety symptoms.  Patient arrived within time allowed. Patient rates her mood at a 5 on a scale of 1-10 with 10 being best. Pt states she feels "overwhelmed." Pt reports she will be home alone for the next 2 days and is looking forward to the "peace." Pt reports continued stress re:  her marriage and causing her anxiety. Pt reports interrupted sleep. Pt repeats stories throughout the session with no insight that she has shared them previously in group. Patient able to process. Patient engaged in discussion.        Session Time: 10:00 am - 11:00 am   Participation Level: Active   Behavioral Response: CasualAlertDepressed   Type of Therapy: Group Therapy   Treatment Goals addressed: Coping   Progress Towards Goals: Progressing   Interventions: CBT, DBT, Solution Focused, Strength-based, Supportive, and Reframing   Therapist Response: Cln led processing group for pt's current struggles. Group members shared stressors and provided support and feedback. Cln brought in topics of boundaries, healthy relationships, and unhealthy thought processes to inform discussion.   Therapist Response: Pt able to process and provide support to group.            Session Time: 11:00 -12:00   Participation Level: Active   Behavioral Response: CasualAlertDepressed   Type of Therapy: Group Therapy, Spiritual Care   Treatment Goals addressed: Coping   Progress Towards Goals: Progressing   Interventions: Supportive, Education   Summary:  Catherine Koch, Chaplain, led group.   Therapist Response: Pt participated        Session Time: 12:00 -1:00   Participation Level: Active   Behavioral Response: CasualAlertDepressed   Type of Therapy: Group Therapy, OT   Treatment Goals addressed: Coping   Progress Towards Goals: Progressing   Interventions: Supportive, Education   Summary:  OT, Catherine Koch, led group. 12:50 -1:00 Clinician led check-out. Clinician assessed for immediate needs, medication compliance and efficacy, and safety concerns   Therapist Response: 12:00 - 12:50: Pt participated 12:50 - 1:00  pm: At check-out, patient reports no immediate concerns. Patient demonstrates progress as evidenced by future oriented thinking. Patient denies SI/HI/self-harm  thoughts at the end of group.    Suicidal/Homicidal: Nowithout intent/plan  Plan: Pt will continue in PHP while working to decrease depression and anxiety symptoms, increase ability to manage stressors, and increase ability to manage symptoms in a healthy manner.   Collaboration of Care: Medication Management AEB T Lewis  Patient/Guardian was advised Release of Information must be obtained prior to any record release in order to collaborate their care with an outside provider. Patient/Guardian was advised if they have not already done so to contact the registration department to sign all necessary forms in order for [place] to release information regarding their care.   Consent: Patient/Guardian gives verbal consent for treatment and assignment of benefits for services provided during this visit. Patient/Guardian expressed understanding and agreed to proceed.   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 Generalized anxiety disorder      Lorin Glass, LCSW

## 2022-02-19 NOTE — Psych (Signed)
Virtual Visit via Video Note  I connected with [name] on 10/14/21 at  9:00 AM EDT by a video enabled telemedicine application and verified that I am speaking with the correct person using two identifiers.  Location: Catherine Koch: Catherine Koch home Provider: clinical home office   I discussed the limitations of evaluation and management by telemedicine and the availability of in person appointments. The Catherine Koch expressed understanding and agreed to proceed.  I discussed the assessment and treatment plan with the Catherine Koch. The Catherine Koch was provided an opportunity to ask questions and all were answered. The Catherine Koch agreed with the plan and demonstrated an understanding of the instructions.   The Catherine Koch was advised to call back or seek an in-person evaluation if the symptoms worsen or if the condition fails to improve as anticipated.  Pt was provided 240 minutes of non-face-to-face time during this encounter.   Lorin Glass, LCSW    [facility] PHP THERAPIST PROGRESS NOTE  ROWE YABLONSKI [id number]  Session Time: 9:00 - 10:00  Participation Level: Active  Behavioral Response: CasualAlertDepressed  Type of Therapy: Group Therapy  Treatment Goals addressed: Coping  Progress Towards Goals: Progressing  Interventions: CBT, DBT, Supportive, and Reframing  Summary: Clinician led check-in regarding current stressors and situation, and review of Catherine Koch completed daily inventory. Clinician utilized active listening and empathetic response and validated Catherine Koch emotions. Clinician facilitated processing group on pertinent issues.?    Summary: Catherine Koch is a 46 y.o. female who presents with depression and anxiety symptoms.  Catherine Koch arrived within time allowed. Catherine Koch rates her mood at a 4 on a scale of 1-10 with 10 being best. Pt states she feels "emotionally drained." Pt reports stress in the relationship with her husband. Pt reports rumination and indecision. Pt states allergies  and anxiety disrupted sleep and she is tired. Catherine Koch able to process. Catherine Koch engaged in discussion.        Session Time: 10:00 am - 11:00 am   Participation Level: Active   Behavioral Response: CasualAlertDepressed   Type of Therapy: Group Therapy   Treatment Goals addressed: Coping   Progress Towards Goals: Progressing   Interventions: CBT, DBT, Solution Focused, Strength-based, Supportive, and Reframing   Therapist Response: Cln led processing group for pt's current struggles. Group members shared stressors and provided support and feedback. Cln brought in topics of boundaries, healthy relationships, and unhealthy thought processes to inform discussion.   Therapist Response: Pt able to process and provide support to group.            Session Time: 11:00 -12:00   Participation Level: Active   Behavioral Response: CasualAlertDepressed   Type of Therapy: Group Therapy, Spiritual Care   Treatment Goals addressed: Coping   Progress Towards Goals: Progressing   Interventions: Supportive, Education   Summary:  [name], Chaplain, led group.   Therapist Response: Pt participated        Session Time: 12:00 -1:00   Participation Level: Active   Behavioral Response: CasualAlertDepressed   Type of Therapy: Group Therapy, OT   Treatment Goals addressed: Coping   Progress Towards Goals: Progressing   Interventions: Supportive, Education   Summary:  OT, [name], led group. 12:50 -1:00 Clinician led check-out. Clinician assessed for immediate needs, medication compliance and efficacy, and safety concerns   Therapist Response: 12:00 - 12:50: Pt participated 12:50 - 1:00 pm: At check-out, Catherine Koch reports no immediate concerns. Catherine Koch demonstrates progress as evidenced by responding to group support. Catherine Koch denies SI/HI/self-harm thoughts at the end  of group.    Suicidal/Homicidal: Nowithout intent/plan  Plan: Pt will continue in PHP while working  to decrease depression and anxiety symptoms, increase ability to manage stressors, and increase ability to manage symptoms in a healthy manner.   Collaboration of Care: Medication Management AEB T Lewis  Catherine Koch/Guardian was advised Release of Information must be obtained prior to any record release in order to collaborate their care with an outside provider. Catherine Koch/Guardian was advised if they have not already done so to contact the registration department to sign all necessary forms in order for Catherine Koch to release information regarding their care.   Consent: Catherine Koch/Guardian gives verbal consent for treatment and assignment of benefits for services provided during this visit. Catherine Koch/Guardian expressed understanding and agreed to proceed.   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 Generalized anxiety disorder      Lorin Glass, LCSW

## 2022-02-19 NOTE — Psych (Signed)
Virtual Visit via Video Note  I connected with Catherine Koch on 10/13/21 at  9:00 AM EDT by a video enabled telemedicine application and verified that I am speaking with the correct person using two identifiers.  Location: Patient: patient home Provider: clinical home office   I discussed the limitations of evaluation and management by telemedicine and the availability of in person appointments. The patient expressed understanding and agreed to proceed.  I discussed the assessment and treatment plan with the patient. The patient was provided an opportunity to ask questions and all were answered. The patient agreed with the plan and demonstrated an understanding of the instructions.   The patient was advised to call back or seek an in-person evaluation if the symptoms worsen or if the condition fails to improve as anticipated.  Pt was provided 240 minutes of non-face-to-face time during this encounter.   Catherine Glass, LCSW    [facility] PHP THERAPIST PROGRESS NOTE  Catherine Koch [id number]  Session Time: 9:00 - 10:00  Participation Level: Active  Behavioral Response: CasualAlertDepressed  Type of Therapy: Group Therapy  Treatment Goals addressed: Coping  Progress Towards Goals: Initial  Interventions: CBT, DBT, Supportive, and Reframing  Summary: Clinician led check-in regarding current stressors and situation, and review of patient completed daily inventory. Clinician utilized active listening and empathetic response and validated patient emotions. Clinician facilitated processing group on pertinent issues.?    Summary: Catherine Koch is a 46 y.o. female who presents with depression and anxiety symptoms.  Patient arrived within time allowed. Patient rates her mood at a 3 on a scale of 1-10 with 10 being best. Pt states she feels "bad." Pt reports she found out they are 3 months away from foreclosure on their house. Pt reports finding this out late brought up issues from  her marriage and past. Pt reports ruminating and catastrophic thinking. Patient able to process. Patient engaged in discussion.        Session Time: 10:00 am - 11:00 am   Participation Level: Active   Behavioral Response: CasualAlertDepressed   Type of Therapy: Group Therapy   Treatment Goals addressed: Coping   Progress Towards Goals: Progressing   Interventions: CBT, DBT, Solution Focused, Strength-based, Supportive, and Reframing   Therapist Response: Cln continued topic of boundaries. Cln discussed emotional and intellectual boundaries including they way they present and difficulties with both. Group members discussed the ways in which emotional and intellectual boundaries is a struggle for them.    Therapist Response: Pt engaged in discussion and is able to identify ways in which emotional and intellectual boundaries affect them.           Session Time: 11:00 -12:00   Participation Level: Active   Behavioral Response: CasualAlertDepressed   Type of Therapy: Group Therapy, Occupational Therapy   Treatment Goals addressed: Coping   Progress Towards Goals: Progressing   Interventions: Supportive, Education   Summary:  Occupational Therapy group led by cln E. Hollan.   Therapist Response: Pt participated         Session Time: 12:00 -1:00   Participation Level: Active   Behavioral Response: CasualAlertDepressed   Type of Therapy: Group therapy   Treatment Goals addressed: Coping   Progress Towards Goals: Progressing   Interventions: CBT; Solution focused; Supportive; Reframing   Summary: 12:00 - 12:50: Cln continued topic of boundaries. Cln discussed material and time boundaries including they way they present and difficulties with both. Group members discussed the ways in which material and  time boundaries is a struggle for them.  12:50 -1:00 Clinician led check-out. Clinician assessed for immediate needs, medication compliance and efficacy, and safety  concerns.   Therapist Response: 12:00 - 12:50: Pt engaged in discussion and is able to identify ways in which material and time boundaries affect them. 12:50 - 1:00 pm: At check-out, patient reports no immediate concerns. Patient demonstrates progress as evidenced by increased openness. Patient denies SI/HI/self-harm thoughts at the end of group.     Suicidal/Homicidal: Nowithout intent/plan  Plan: Pt will continue in PHP while working to decrease depression and anxiety symptoms, increase ability to manage stressors, and increase ability to manage symptoms in a healthy manner.   Collaboration of Care: Medication Management AEB T Lewis  Patient/Guardian was advised Release of Information must be obtained prior to any record release in order to collaborate their care with an outside provider. Patient/Guardian was advised if they have not already done so to contact the registration department to sign all necessary forms in order for [place] to release information regarding their care.   Consent: Patient/Guardian gives verbal consent for treatment and assignment of benefits for services provided during this visit. Patient/Guardian expressed understanding and agreed to proceed.   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 Generalized anxiety disorder      Catherine Glass, LCSW

## 2022-02-19 NOTE — Psych (Signed)
Virtual Visit via Video Note  I connected with Catherine Koch on 10/26/21 at  9:00 AM EDT by a video enabled telemedicine application and verified that I am speaking with the correct person using two identifiers.  Location: Patient: patient home Provider: clinical home office   I discussed the limitations of evaluation and management by telemedicine and the availability of in person appointments. The patient expressed understanding and agreed to proceed.  I discussed the assessment and treatment plan with the patient. The patient was provided an opportunity to ask questions and all were answered. The patient agreed with the plan and demonstrated an understanding of the instructions.   The patient was advised to call back or seek an in-person evaluation if the symptoms worsen or if the condition fails to improve as anticipated.  Pt was provided 240 minutes of non-face-to-face time during this encounter.   Donia Guiles, LCSW    [facility] PHP THERAPIST PROGRESS NOTE  Catherine Koch [id number]  Session Time: 9:00 - 10:00  Participation Level: Active  Behavioral Response: CasualAlertDepressed  Type of Therapy: Group Therapy  Treatment Goals addressed: Coping  Progress Towards Goals: Progressing  Interventions: CBT, DBT, Supportive, and Reframing  Summary: Clinician led check-in regarding current stressors and situation, and review of patient completed daily inventory. Clinician utilized active listening and empathetic response and validated patient emotions. Clinician facilitated processing group on pertinent issues.?    Summary: Catherine Koch is a 46 y.o. female who presents with depression and anxiety symptoms. Patient arrived within time allowed.  Patient rates her mood at a 7.5 on a scale of 1-10 with 10 being best. Pt states she feels "okay." Pt reports feeling drowsy and that she may have an issue with her CPap machine. Pt states she will reach out to her provider. Pt  reports weekend was ok except for an issue with her online game friends. Pt reports feeling "attacked" and having trouble moving past the conflict.  Patient able to process. Patient engaged in discussion.        Session Time: 10:00 am - 11:00 am   Participation Level: Active   Behavioral Response: CasualAlertDepressed   Type of Therapy: Group Therapy   Treatment Goals addressed: Coping   Progress Towards Goals: Progressing   Interventions: CBT, DBT, Solution Focused, Strength-based, Supportive, and Reframing   Therapist Response: Cln led discussion on rumination: and how it can affect [place] negatively. Group members share topics, situations, and times in which rumination is most problem problematic for them. Cln encouraged pt's to consider DBT distraction and STOP skills or CBT thought challenging to manage rumination.    Therapist Response:  Pt engaged in discussion.           Session Time: 11:00 -12:00   Participation Level: Active   Behavioral Response: CasualAlertDepressed   Type of Therapy: Group Therapy, Occupational Therapy   Treatment Goals addressed: Coping   Progress Towards Goals: Progressing   Interventions: Supportive, Education   Summary:  Occupational Therapy group led by cln E. Hollan.   Therapist Response: Pt participated         Session Time: 12:00 -1:00   Participation Level: Active   Behavioral Response: CasualAlertDepressed   Type of Therapy: Group therapy   Treatment Goals addressed: Coping   Progress Towards Goals: Progressing   Interventions: CBT; Solution focused; Supportive; Reframing   Summary: 12:00 - 12:50: Cln continued topic of DBT distress tolerance skills. Cln introduced TIPP skills to use during cases of extreme  emotion. Group practiced deep breathing and progressive muscle relaxation together. Group discussed which situations they can apply TIPP skills.  12:50 -1:00 Clinician led check-out. Clinician assessed for immediate  needs, medication compliance and efficacy, and safety concerns.   Therapist Response: 12:00 - 12:50: Pt engaged in discussion and practiced with group.  12:50 - 1:00 pm: At check-out, patient reports no immediate concerns. Patient demonstrates progress as evidenced by reports of feeling clearer. Patient denies SI/HI/self-harm thoughts at the end of group.    Suicidal/Homicidal: Nowithout intent/plan  Plan: Pt will continue in PHP while working to decrease depression and anxiety symptoms, increase ability to manage stressors, and increase ability to manage symptoms in a healthy manner.   Collaboration of Care: Medication Management AEB T Lewis  Patient/Guardian was advised Release of Information must be obtained prior to any record release in order to collaborate their care with an outside provider. Patient/Guardian was advised if they have not already done so to contact the registration department to sign all necessary forms in order for [place] to release information regarding their care.   Consent: Patient/Guardian gives verbal consent for treatment and assignment of benefits for services provided during this visit. Patient/Guardian expressed understanding and agreed to proceed.   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 Generalized anxiety disorder      Lorin Glass, LCSW

## 2022-02-19 NOTE — Psych (Signed)
Virtual Visit via Video Note  I connected with [name] on 10/23/21 at  9:00 AM EDT by a video enabled telemedicine application and verified that I am speaking with the correct person using two identifiers.  Location: Patient: patient home Provider: clinical home office   I discussed the limitations of evaluation and management by telemedicine and the availability of in person appointments. The patient expressed understanding and agreed to proceed.  I discussed the assessment and treatment plan with the patient. The patient was provided an opportunity to ask questions and all were answered. The patient agreed with the plan and demonstrated an understanding of the instructions.   The patient was advised to call back or seek an in-person evaluation if the symptoms worsen or if the condition fails to improve as anticipated.  Pt was provided 240 minutes of non-face-to-face time during this encounter.   Donia Guiles, LCSW    [facility] PHP THERAPIST PROGRESS NOTE  Catherine Koch [id number]  Session Time: 9:00 - 10:00  Participation Level: Active  Behavioral Response: CasualAlertDepressed  Type of Therapy: Group Therapy  Treatment Goals addressed: Coping  Progress Towards Goals: Progressing  Interventions: CBT, DBT, Supportive, and Reframing  Summary: Clinician led check-in regarding current stressors and situation, and review of patient completed daily inventory. Clinician utilized active listening and empathetic response and validated patient emotions. Clinician facilitated processing group on pertinent issues.?    Summary: Catherine Koch is a 46 y.o. female who presents with depression and anxiety symptoms. Patient arrived within time allowed.  Patient rates her mood at a 6 on a scale of 1-10 with 10 being best. Pt states she feels "okay." Pt reports feeling better with the house to herself. Pt states she is not as anxious today and feels more motivated since she has  been in group. Pt continues to struggle with hygiene tasks and said she hopes to shower tomorrow. Patient able to process. Patient engaged in discussion.        Session Time: 10:00 am - 11:00 am   Participation Level: Active   Behavioral Response: CasualAlertDepressed   Type of Therapy: Group Therapy   Treatment Goals addressed: Coping   Progress Towards Goals: Progressing   Interventions: CBT, DBT, Solution Focused, Strength-based, Supportive, and Reframing   Therapist Response: Cln led discussion on planning ahead as a way to mitigate anxiety. Group members shared worries about keeping up good habits when returning to "normal" life post-treatment. Group able to brainstorm ways to build habits now and how they can fit into their life after treatment.   Therapist Response:  Pt engaged in discussion and identified ways to plan ahead for anxieties.            Session Time: 11:00 -12:00   Participation Level: Active   Behavioral Response: CasualAlertDepressed   Type of Therapy: Group Therapy, Occupational Therapy   Treatment Goals addressed: Coping   Progress Towards Goals: Progressing   Interventions: Supportive, Education   Summary:  Occupational Therapy group led by cln E. Hollan.   Therapist Response: Pt participated         Session Time: 12:00 -1:00   Participation Level: Active   Behavioral Response: CasualAlertDepressed   Type of Therapy: Group therapy   Treatment Goals addressed: Coping   Progress Towards Goals: Progressing   Interventions: CBT; Solution focused; Supportive; Reframing   Summary: 12:00 - 12:50: Cln led discussion on saying "no." Group members shared struggles they have with saying no and how it  affects them. Cln utilized boundaries, communication, and self-esteem tenets to inform discussion.  12:50 -1:00 Clinician led check-out. Clinician assessed for immediate needs, medication compliance and efficacy, and safety concerns.    Therapist Response: 12:00 - 12:50: Pt engaged in discussion and reports increased understanding of how to say "no."  12:50 - 1:00 pm: At check-out, patient reports no immediate concerns. Patient demonstrates progress as evidenced by decrease in anxiety. Patient denies SI/HI/self-harm thoughts at the end of group.    Suicidal/Homicidal: Nowithout intent/plan  Plan: Pt will continue in PHP while working to decrease depression and anxiety symptoms, increase ability to manage stressors, and increase ability to manage symptoms in a healthy manner.   Collaboration of Care: Medication Management AEB T Lewis  Patient/Guardian was advised Release of Information must be obtained prior to any record release in order to collaborate their care with an outside provider. Patient/Guardian was advised if they have not already done so to contact the registration department to sign all necessary forms in order for [place] to release information regarding their care.   Consent: Patient/Guardian gives verbal consent for treatment and assignment of benefits for services provided during this visit. Patient/Guardian expressed understanding and agreed to proceed.   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 Generalized anxiety disorder      Catherine Glass, LCSW

## 2022-02-19 NOTE — Psych (Signed)
Virtual Visit via Video Note  I connected with Catherine Koch on 10/15/21 at  9:00 AM EDT by a video enabled telemedicine application and verified that I am speaking with the correct person using two identifiers.  Location: Patient: patient home Provider: clinical home office   I discussed the limitations of evaluation and management by telemedicine and the availability of in person appointments. The patient expressed understanding and agreed to proceed.  I discussed the assessment and treatment plan with the patient. The patient was provided an opportunity to ask questions and all were answered. The patient agreed with the plan and demonstrated an understanding of the instructions.   The patient was advised to call back or seek an in-person evaluation if the symptoms worsen or if the condition fails to improve as anticipated.  Pt was provided 240 minutes of non-face-to-face time during this encounter.   Lorin Glass, LCSW    [facility] PHP THERAPIST PROGRESS NOTE  Catherine Koch [id number]  Session Time: 9:00 - 10:00  Participation Level: Active  Behavioral Response: CasualAlertDepressed  Type of Therapy: Group Therapy  Treatment Goals addressed: Coping  Progress Towards Goals: Initial  Interventions: CBT, DBT, Supportive, and Reframing  Summary: Clinician led check-in regarding current stressors and situation, and review of patient completed daily inventory. Clinician utilized active listening and empathetic response and validated patient emotions. Clinician facilitated processing group on pertinent issues.?    Summary: Catherine Koch is a 46 y.o. female who presents with depression and anxiety symptoms. Patient arrived within time allowed. Patient rates her mood at a 4 on a scale of 1-10 with 10 being best. Pt states she feels "annoyed." Pt reports continued stress with husband. Pt states feeling "stuck." Pt reports sleep impairment due to continued allergies. Pt  struggles with insight. Patient able to process. Patient engaged in discussion.        Session Time: 10:00 am - 11:00 am   Participation Level: Active   Behavioral Response: CasualAlertDepressed   Type of Therapy: Group Therapy   Treatment Goals addressed: Coping   Progress Towards Goals: Progressing   Interventions: CBT, DBT, Solution Focused, Strength-based, Supportive, and Reframing   Therapist Response: Cln led discussion on accountability and the balance between taking responsibility and not beating ourselves up. Group members shared current consequences they are dealing with and how they are processing them. Cln encouraged pt's to utilize the Best Friend Test to make it easier to offer themselves kindness.    Therapist Response: Pt engaged in discussion and is able to process.            Session Time: 11:00 -12:00   Participation Level: Active   Behavioral Response: CasualAlertDepressed   Type of Therapy: Group Therapy, Occupational Therapy   Treatment Goals addressed: Coping   Progress Towards Goals: Progressing   Interventions: Supportive, Education   Summary:  Occupational Therapy group led by cln E. Hollan.   Therapist Response: Pt participated         Session Time: 12:00 -1:00   Participation Level: Active   Behavioral Response: CasualAlertDepressed   Type of Therapy: Group therapy   Treatment Goals addressed: Coping   Progress Towards Goals: Progressing   Interventions: CBT; Solution focused; Supportive; Reframing   Summary: 12:00 - 12:50: Cln continued topic of boundaries and led a "boundary workshop" in which group members brought current boundary issues and group worked together to apply boundary concepts to help address the concern. Cln helped shape conversation to maintain fidelity.  12:50 -1:00 Clinician led check-out. Clinician assessed for immediate needs, medication compliance and efficacy, and safety concerns.   Therapist  Response: 12:00 - 12:50: Pt engaged in discussion and shared a current boundary issue and reports gaining insight.  12:50 - 1:00 pm: At check-out, patient reports no immediate concerns. Patient demonstrates progress as evidenced by openness to feedback. Patient denies SI/HI/self-harm thoughts at the end of group.     Suicidal/Homicidal: Nowithout intent/plan  Plan: Pt will continue in PHP while working to decrease depression and anxiety symptoms, increase ability to manage stressors, and increase ability to manage symptoms in a healthy manner.   Collaboration of Care: Medication Management AEB T Lewis  Patient/Guardian was advised Release of Information must be obtained prior to any record release in order to collaborate their care with an outside provider. Patient/Guardian was advised if they have not already done so to contact the registration department to sign all necessary forms in order for [place] to release information regarding their care.   Consent: Patient/Guardian gives verbal consent for treatment and assignment of benefits for services provided during this visit. Patient/Guardian expressed understanding and agreed to proceed.   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 Generalized anxiety disorder      Lorin Glass, LCSW

## 2022-02-19 NOTE — Psych (Signed)
Virtual Visit via Video Note  I connected with Catherine Koch on 10/20/21 at  9:00 AM EDT by a video enabled telemedicine application and verified that I am speaking with the correct person using two identifiers.  Location: Patient: patient home Provider: clinical home office   I discussed the limitations of evaluation and management by telemedicine and the availability of in person appointments. The patient expressed understanding and agreed to proceed.  I discussed the assessment and treatment plan with the patient. The patient was provided an opportunity to ask questions and all were answered. The patient agreed with the plan and demonstrated an understanding of the instructions.   The patient was advised to call back or seek an in-person evaluation if the symptoms worsen or if the condition fails to improve as anticipated.  Pt was provided 240 minutes of non-face-to-face time during this encounter.   Lorin Glass, LCSW    [facility] PHP THERAPIST PROGRESS NOTE  Catherine Koch [id number]  Session Time: 9:00 - 10:00  Participation Level: Active  Behavioral Response: CasualAlertDepressed  Type of Therapy: Group Therapy  Treatment Goals addressed: Coping  Progress Towards Goals: Progressing  Interventions: CBT, DBT, Supportive, and Reframing  Summary: Clinician led check-in regarding current stressors and situation, and review of patient completed daily inventory. Clinician utilized active listening and empathetic response and validated patient emotions. Clinician facilitated processing group on pertinent issues.?    Summary: Catherine Koch is a 46 y.o. female who presents with depression and anxiety symptoms. Patient arrived within time allowed.  Patient rates her mood at a 5 on a scale of 1-10 with 10 being best. Pt states she feels "not great." Pt reports was looking forward to a weekend to herself, but husband's plans changed. Pt reports increased frustration all  weekend due to these unmet expectations. Pt states she slept approximately 4 hours a night. Pt reports difficulty in managing her frustrations. Patient able to process. Patient engaged in discussion.        Session Time: 10:00 am - 11:00 am   Participation Level: Active   Behavioral Response: CasualAlertDepressed   Type of Therapy: Group Therapy   Treatment Goals addressed: Coping   Progress Towards Goals: Progressing   Interventions: CBT, DBT, Solution Focused, Strength-based, Supportive, and Reframing   Therapist Response: Cln led discussion on setting boundaries as a way to increase self-care. Group members discussed things that are stumbling blocks to them engaging in self-care and worked to determine what boundary could address that stumbling block. Group worked together to determine how to address the boundary. Cln brought in topics of boundaries, assertiveness, thought challenging, and self-care.    Therapist Response: Pt engaged in discussion and identifies a boundary that can improve their self-care.            Session Time: 11:00 -12:00   Participation Level: Active   Behavioral Response: CasualAlertDepressed   Type of Therapy: Group Therapy, Occupational Therapy   Treatment Goals addressed: Coping   Progress Towards Goals: Progressing   Interventions: Supportive, Education   Summary:  Occupational Therapy group led by cln E. Hollan.   Therapist Response: Pt participated         Session Time: 12:00 -1:00   Participation Level: Active   Behavioral Response: CasualAlertDepressed   Type of Therapy: Group therapy   Treatment Goals addressed: Coping   Progress Towards Goals: Progressing   Interventions: CBT; Solution focused; Supportive; Reframing   Summary: 12:00 - 12:50: Cln introduced grounding techniques as  a coping strategy. Cln utilized handout "Detaching from emotional pain" from [employer]. Group reviewed grounding strategies and how  they can apply them to their every day life and in which situations.  12:50 -1:00 Clinician led check-out. Clinician assessed for immediate needs, medication compliance and efficacy, and safety concerns.   Therapist Response: 12:00 - 12:50: Pt engaged in discussion and is able to identify ways to utilize the techniques.  12:50 - 1:00 pm: At check-out, patient reports no immediate concerns. Patient demonstrates progress as evidenced by having difficult conversations. Patient denies SI/HI/self-harm thoughts at the end of group.     Suicidal/Homicidal: Nowithout intent/plan  Plan: Pt will continue in PHP while working to decrease depression and anxiety symptoms, increase ability to manage stressors, and increase ability to manage symptoms in a healthy manner.   Collaboration of Care: Medication Management AEB T Lewis  Patient/Guardian was advised Release of Information must be obtained prior to any record release in order to collaborate their care with an outside provider. Patient/Guardian was advised if they have not already done so to contact the registration department to sign all necessary forms in order for [place] to release information regarding their care.   Consent: Patient/Guardian gives verbal consent for treatment and assignment of benefits for services provided during this visit. Patient/Guardian expressed understanding and agreed to proceed.   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 Generalized anxiety disorder      Lorin Glass, LCSW

## 2022-02-19 NOTE — Psych (Signed)
Virtual Visit via Video Note  I connected with [name] on 10/12/21 at  9:00 AM EDT by a video enabled telemedicine application and verified that I am speaking with the correct person using two identifiers.  Location: Patient: patient home Provider: clinical home office   I discussed the limitations of evaluation and management by telemedicine and the availability of in person appointments. The patient expressed understanding and agreed to proceed.  I discussed the assessment and treatment plan with the patient. The patient was provided an opportunity to ask questions and all were answered. The patient agreed with the plan and demonstrated an understanding of the instructions.   The patient was advised to call back or seek an in-person evaluation if the symptoms worsen or if the condition fails to improve as anticipated.  Pt was provided 240 minutes of non-face-to-face time during this encounter.   Lorin Glass, LCSW    [facility] PHP THERAPIST PROGRESS NOTE  SHRESHTA MEDLEY [id number]  Session Time: 9:00 - 10:00  Participation Level: Active  Behavioral Response: CasualAlertDepressed  Type of Therapy: Group Therapy  Treatment Goals addressed: Coping  Progress Towards Goals: Initial  Interventions: CBT, DBT, Supportive, and Reframing  Summary: Clinician led check-in regarding current stressors and situation, and review of patient completed daily inventory. Clinician utilized active listening and empathetic response and validated patient emotions. Clinician facilitated processing group on pertinent issues.?    Summary: KENNADEE WALTHOUR is a 46 y.o. female who presents with depression and anxiety symptoms.  Patient arrived within time allowed. Patient rates her mood at a 4.5 on a scale of 1-10 with 10 being best. Pt states she feels "not great." Pt reports she is "going through a lot" mentally and physically. Pt states she left the house for the first "real time" on  Sunday. Pt states husband was with her which helped, but it upset pt because of how much she struggled with a "normal" task. Pt reports difficulty completing ADLs. Patient able to process. Patient engaged in discussion.         Session Time: 10:00 am - 11:00 am   Participation Level: Active   Behavioral Response: CasualAlertDepressed   Type of Therapy: Group Therapy   Treatment Goals addressed: Coping   Progress Towards Goals: Progressing   Interventions: CBT, DBT, Solution Focused, Strength-based, Supportive, and Reframing   Therapist Response:  Cln introduced topic of stress management and the model of the "4 A's of stress management:" avoid, alter, accept, and adapt. Group members worked through Advice worker and discussed barriers to utilizing the 4 A's for stressors.    Therapist Response: Pt engaged in discussion and reports understanding of how to utilize the 4 A's.            Session Time: 11:00 -12:00   Participation Level: Active   Behavioral Response: CasualAlertDepressed   Type of Therapy: Group Therapy, Occupational Therapy   Treatment Goals addressed: Coping   Progress Towards Goals: Progressing   Interventions: Supportive, Education   Summary:  Occupational Therapy group led by cln E. Hollan.   Therapist Response: Pt participated         Session Time: 12:00 -1:00   Participation Level: Active   Behavioral Response: CasualAlertDepressed   Type of Therapy: Group therapy   Treatment Goals addressed: Coping   Progress Towards Goals: Progressing   Interventions: CBT; Solution focused; Supportive; Reframing   Summary: 12:00 - 12:50: Cln introduced topic of boundaries. Cln discussed how boundaries inform our relationships and  affect self-esteem and personal agency. Group discussed the three types of boundaries: rigid, porous, and healthy and when each type is most helpful/harmful.    12:50 -1:00 Clinician led check-out. Clinician assessed for  immediate needs, medication compliance and efficacy, and safety concerns.   Therapist Response: 12:00 - 12:50: Pt engaged in discussion and identifies situations in which they've been in different boundary states. 12:50 - 1:00 pm: At check-out, patient reports no immediate concerns. Patient demonstrates progress as evidenced by participation in first group session. Patient denies SI/HI/self-harm thoughts at the end of group.     Suicidal/Homicidal: Nowithout intent/plan  Plan: Pt will continue in PHP while working to decrease depression and anxiety symptoms, increase ability to manage stressors, and increase ability to manage symptoms in a healthy manner.   Collaboration of Care: Medication Management AEB T Lewis  Patient/Guardian was advised Release of Information must be obtained prior to any record release in order to collaborate their care with an outside provider. Patient/Guardian was advised if they have not already done so to contact the registration department to sign all necessary forms in order for [place] to release information regarding their care.   Consent: Patient/Guardian gives verbal consent for treatment and assignment of benefits for services provided during this visit. Patient/Guardian expressed understanding and agreed to proceed.   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 Generalized anxiety disorder      Donia Guiles, LCSW

## 2022-02-19 NOTE — Psych (Signed)
Virtual Visit via Video Note  I connected with Catherine Koch on 10/16/21 at  9:00 AM EDT by a video enabled telemedicine application and verified that I am speaking with the correct person using two identifiers.  Location: Patient: patient home Provider: clinical home office   I discussed the limitations of evaluation and management by telemedicine and the availability of in person appointments. The patient expressed understanding and agreed to proceed.  I discussed the assessment and treatment plan with the patient. The patient was provided an opportunity to ask questions and all were answered. The patient agreed with the plan and demonstrated an understanding of the instructions.   The patient was advised to call back or seek an in-person evaluation if the symptoms worsen or if the condition fails to improve as anticipated.  Pt was provided 240 minutes of non-face-to-face time during this encounter.   Catherine Guiles, LCSW    [facility] PHP THERAPIST PROGRESS NOTE  Catherine Koch [id number]  Session Time: 9:00 - 10:00  Participation Level: Active  Behavioral Response: CasualAlertDepressed  Type of Therapy: Group Therapy  Treatment Goals addressed: Coping  Progress Towards Goals: Progressing  Interventions: CBT, DBT, Supportive, and Reframing  Summary: Clinician led check-in regarding current stressors and situation, and review of patient completed daily inventory. Clinician utilized active listening and empathetic response and validated patient emotions. Clinician facilitated processing group on pertinent issues.?    Summary: Catherine Koch is a 46 y.o. female who presents with depression and anxiety symptoms. Patient arrived within time allowed. Patient rates her mood at a 5 on a scale of 1-10 with 10 being best. Pt states she feels "okay." Pt reports she slept better yesterday as allergies are improving. Pt states she did self-care activities of watching a show and  playing video games which improved her mood. Pt reports continued stress in her marriage. Patient able to process. Patient engaged in discussion.        Session Time: 10:00 am - 11:00 am   Participation Level: Active   Behavioral Response: CasualAlertDepressed   Type of Therapy: Group Therapy   Treatment Goals addressed: Coping   Progress Towards Goals: Progressing   Interventions: CBT, DBT, Solution Focused, Strength-based, Supportive, and Reframing   Therapist Response: Cln led discussion on the ways our phones can cause barriers to our mental health. Group discussed struggles they have with their phones and the role it can play in unhealthy thought patterns and behaviors. Group worked to problem solve ways to cut down on negative behaviors.    Therapist Response: Pt engaged in discussion.        Session Time: 11:00 -12:00   Participation Level: Active   Behavioral Response: CasualAlertDepressed   Type of Therapy: Group Therapy   Treatment Goals addressed: Coping   Progress Towards Goals: Progressing   Interventions: CBT, DBT, Solution Focused, Strength-based, Supportive, and Reframing   Summary:  Cln led discussion on the connection between fear and anxiety. Cln contextualized anxiety and fear as both being future oriented and fed by the unknown. Group members shared ways in which fear interacts with their anxiety and the problems it creates. Cln encouraged CBT thought challenging and reframing as ways to address problematic fears and anxieties.    Therapist Response:  Pt engaged in discussion and is able to make connections between fear and anxiety.          Session Time: 12:00 -1:00   Participation Level: Active   Behavioral Response: CasualAlertDepressed  Type of Therapy: Group therapy   Treatment Goals addressed: Coping   Progress Towards Goals: Progressing   Interventions: CBT; Solution focused; Supportive; Reframing   Summary: 12:00 - 12:50: Cln  led discussion on decision making and how to apply logic to fears. Group viewed TED talk "Why you should define your fears not your goals" to aid discussion. Group discussed ways to consider how we can address fears and make them manageable.  12:50 - 1:00 pm: At check-out, patient reports no immediate concerns. Patient demonstrates progress as evidenced by applying self care strategies. Patient denies SI/HI/self-harm thoughts at the end of group.     Suicidal/Homicidal: Nowithout intent/plan  Plan: Pt will continue in PHP while working to decrease depression and anxiety symptoms, increase ability to manage stressors, and increase ability to manage symptoms in a healthy manner.   Collaboration of Care: Medication Management AEB T Lewis  Patient/Guardian was advised Release of Information must be obtained prior to any record release in order to collaborate their care with an outside provider. Patient/Guardian was advised if they have not already done so to contact the registration department to sign all necessary forms in order for [place] to release information regarding their care.   Consent: Patient/Guardian gives verbal consent for treatment and assignment of benefits for services provided during this visit. Patient/Guardian expressed understanding and agreed to proceed.   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 Generalized anxiety disorder      Lorin Glass, LCSW

## 2022-02-20 NOTE — Psych (Signed)
Virtual Visit via Video Note  I connected with [name] on 10/29/21 at  9:00 AM EDT by a video enabled telemedicine application and verified that I am speaking with the correct person using two identifiers.  Location: Patient: patient home Provider: clinical home office   I discussed the limitations of evaluation and management by telemedicine and the availability of in person appointments. The patient expressed understanding and agreed to proceed.  I discussed the assessment and treatment plan with the patient. The patient was provided an opportunity to ask questions and all were answered. The patient agreed with the plan and demonstrated an understanding of the instructions.   The patient was advised to call back or seek an in-person evaluation if the symptoms worsen or if the condition fails to improve as anticipated.  Pt was provided 240 minutes of non-face-to-face time during this encounter.   Donia Guiles, LCSW    [facility] PHP THERAPIST PROGRESS NOTE  RAYA MCKINSTRY [id number]  Session Time: 9:00 - 10:00  Participation Level: Active  Behavioral Response: CasualAlertDepressed  Type of Therapy: Group Therapy  Treatment Goals addressed: Coping  Progress Towards Goals: Progressing  Interventions: CBT, DBT, Supportive, and Reframing  Summary: Clinician led check-in regarding current stressors and situation, and review of patient completed daily inventory. Clinician utilized active listening and empathetic response and validated patient emotions. Clinician facilitated processing group on pertinent issues.?    Summary: LAVONNE KINDERMAN is a 46 y.o. female who presents with depression and anxiety symptoms. Patient arrived within time allowed.  Patient rates her mood at a 8 on a scale of 1-10 with 10 being best. Pt states she feels "okay." Pt reports feeling physically drained and lists congestion, and muscle fatigue as issues. Pt reports positive time with husband  yesterday. Pt presents as scattered today. Pt struggling with filling her day.  Patient able to process. Patient engaged in discussion.        Session Time: 10:00 am - 11:00 am   Participation Level: Active   Behavioral Response: CasualAlertDepressed   Type of Therapy: Group Therapy   Treatment Goals addressed: Coping   Progress Towards Goals: Progressing   Interventions: CBT, DBT, Solution Focused, Strength-based, Supportive, and Reframing   Therapist Response: Cln led discussion on feelings and the role they play for our lives. Cln contextualized feelings as a warning system that brings attention to areas of our lives that need focus. Group members discussed how to pay attention to what the feeling is telling us versus reacting to unpleasant aspects of a feeling.    Therapist Response:  Pt engaged in discussion and reports understanding.            Session Time: 11:00 -12:00   Participation Level: Active   Behavioral Response: CasualAlertDepressed   Type of Therapy: Group Therapy, Occupational Therapy   Treatment Goals addressed: Coping   Progress Towards Goals: Progressing   Interventions: Supportive, Education   Summary:  Occupational Therapy group led by cln E. Hollan.   Therapist Response: Pt participated         Session Time: 12:00 -1:00   Participation Level: Active   Behavioral Response: CasualAlertDepressed   Type of Therapy: Group therapy   Treatment Goals addressed: Coping   Progress Towards Goals: Progressing   Interventions: CBT; Solution focused; Supportive; Reframing   Summary: 12:00 - 12:50: Cln introduced CBT and the way in which it can provide context for addressing stumbling blocks. Group discussed "the problem is not the problem,  the problem is how we're thinking about the problem" and tried to change perspective on current struggles.  12:50 -1:00 Clinician led check-out. Clinician assessed for immediate needs, medication compliance  and efficacy, and safety concerns.   Therapist Response: 12:00 - 12:50: Pt engaged in discussion and is able to attempt reframing using CBT.  12:50 - 1:00 pm: At check-out, patient reports no immediate concerns. Patient demonstrates progress as evidenced by looking forward. Patient denies SI/HI/self-harm thoughts at the end of group.    Suicidal/Homicidal: Nowithout intent/plan  Plan: Pt will continue in PHP while working to decrease depression and anxiety symptoms, increase ability to manage stressors, and increase ability to manage symptoms in a healthy manner.   Collaboration of Care: Medication Management AEB T Lewis  Patient/Guardian was advised Release of Information must be obtained prior to any record release in order to collaborate their care with an outside provider. Patient/Guardian was advised if they have not already done so to contact the registration department to sign all necessary forms in order for [place] to release information regarding their care.   Consent: Patient/Guardian gives verbal consent for treatment and assignment of benefits for services provided during this visit. Patient/Guardian expressed understanding and agreed to proceed.   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 Generalized anxiety disorder      Lorin Glass, LCSW

## 2022-02-20 NOTE — Psych (Signed)
Virtual Visit via Video Note  I connected with [name] on 10/30/21 at  9:00 AM EDT by a video enabled telemedicine application and verified that I am speaking with the correct person using two identifiers.  Location: Patient: patient home Provider: clinical home office   I discussed the limitations of evaluation and management by telemedicine and the availability of in person appointments. The patient expressed understanding and agreed to proceed.  I discussed the assessment and treatment plan with the patient. The patient was provided an opportunity to ask questions and all were answered. The patient agreed with the plan and demonstrated an understanding of the instructions.   The patient was advised to call back or seek an in-person evaluation if the symptoms worsen or if the condition fails to improve as anticipated.  Pt was provided 240 minutes of non-face-to-face time during this encounter.   Lorin Glass, LCSW    [facility] PHP THERAPIST PROGRESS NOTE  IREM AUTERY [id number]  Session Time: 9:00 - 10:00  Participation Level: Active  Behavioral Response: CasualAlertDepressed  Type of Therapy: Group Therapy  Treatment Goals addressed: Coping  Progress Towards Goals: Progressing  Interventions: CBT, DBT, Supportive, and Reframing  Summary: Clinician led check-in regarding current stressors and situation, and review of patient completed daily inventory. Clinician utilized active listening and empathetic response and validated patient emotions. Clinician facilitated processing group on pertinent issues.?    Summary: Catherine Koch is a 46 y.o. female who presents with depression and anxiety symptoms. Patient arrived within time allowed.  Patient rates her mood at a 8 on a scale of 1-10 with 10 being best. Pt states she feels "good." Pt reports she slept well last night and credits it with doing breathing exercises before bed. Pt reports she is working on  sticking with a routine. Patient able to process. Patient engaged in discussion.        Session Time: 10:00 am - 11:00 am   Participation Level: Active   Behavioral Response: CasualAlertDepressed   Type of Therapy: Group Therapy   Treatment Goals addressed: Coping   Progress Towards Goals: Progressing   Interventions: CBT, DBT, Solution Focused, Strength-based, Supportive, and Reframing   Therapist Response: Cln led discussion on family dynamics and the way in which they impact [place]. Group members shared struggles with their families and the way the patterns of behaviors have negatively impacted them. Cln provided space to process and validated pt's experiences.     Therapist Response:  Pt engaged in discussion and is able to process.         Session Time: 11:00 -12:00   Participation Level: Active   Behavioral Response: CasualAlertDepressed   Type of Therapy: Group Therapy, Occupational Therapy   Treatment Goals addressed: Coping   Progress Towards Goals: Progressing   Interventions: Supportive, Education   Summary:  Occupational Therapy group led by cln E. Hollan.   Therapist Response: Pt participated         Session Time: 12:00 -1:00   Participation Level: Active   Behavioral Response: CasualAlertDepressed   Type of Therapy: Group therapy   Treatment Goals addressed: Coping   Progress Towards Goals: Progressing   Interventions: CBT; Solution focused; Supportive; Reframing   Summary: 12:00 - 12:50: Cln introduced DBT concept of radical acceptance. Cln discussed radical acceptance as a strategy to decrease distress and to manage situations outside of their control. Group volunteered struggles they are experiencing with control and cln helped group apply radical acceptance.  12:50 -  1:00 Clinician led check-out. Clinician assessed for immediate needs, medication compliance and efficacy, and safety concerns.   Therapist Response: 12:00 - 12:50: Pt engaged  in discussion and identified ways they can apply radical acceptance in their life.  12:50 - 1:00 pm: At check-out, patient reports no immediate concerns. Patient demonstrates progress as evidenced by increased sleep and stability. Patient denies SI/HI/self-harm thoughts at the end of group.    Suicidal/Homicidal: Nowithout intent/plan  Plan: Pt will discharge from PHP due to meeting treatment goals of decreased depression and anxiety symptoms, increased ability to manage stressors, and increased ability to manage symptoms in a healthy manner. Pt will step down to OP therapy and psychiatry within this agency. Pt has follow up appointments for psychiatry with Zigmund Gottron on 6/14 and for therapy with C Hooker on 6/21. Pt and provider are aligned with discharge. Pt denies SI/HI at time of discharge.   Collaboration of Care: Medication Management AEB T Lewis  Patient/Guardian was advised Release of Information must be obtained prior to any record release in order to collaborate their care with an outside provider. Patient/Guardian was advised if they have not already done so to contact the registration department to sign all necessary forms in order for [place] to release information regarding their care.   Consent: Patient/Guardian gives verbal consent for treatment and assignment of benefits for services provided during this visit. Patient/Guardian expressed understanding and agreed to proceed.   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 Generalized anxiety disorder      Lorin Glass, LCSW

## 2022-02-20 NOTE — Psych (Signed)
Virtual Visit via Video Note  I connected with Catherine Koch on 10/28/21 at  9:00 AM EDT by a video enabled telemedicine application and verified that I am speaking with the correct person using two identifiers.  Location: Patient: patient home Provider: clinical home office   I discussed the limitations of evaluation and management by telemedicine and the availability of in person appointments. The patient expressed understanding and agreed to proceed.  I discussed the assessment and treatment plan with the patient. The patient was provided an opportunity to ask questions and all were answered. The patient agreed with the plan and demonstrated an understanding of the instructions.   The patient was advised to call back or seek an in-person evaluation if the symptoms worsen or if the condition fails to improve as anticipated.  Pt was provided 240 minutes of non-face-to-face time during this encounter.   Lorin Glass, LCSW    [facility] PHP THERAPIST PROGRESS NOTE  Catherine Koch [id number]  Session Time: 9:00 - 10:00  Participation Level: Active  Behavioral Response: CasualAlertDepressed  Type of Therapy: Group Therapy  Treatment Goals addressed: Coping  Progress Towards Goals: Progressing  Interventions: CBT, DBT, Supportive, and Reframing  Summary: Clinician led check-in regarding current stressors and situation, and review of patient completed daily inventory. Clinician utilized active listening and empathetic response and validated patient emotions. Clinician facilitated processing group on pertinent issues.?    Summary: Catherine Koch is a 46 y.o. female who presents with depression and anxiety symptoms. Patient arrived within time allowed.  Patient rates her mood at a 8 on a scale of 1-10 with 10 being best. Pt states she feels "okay." Pt reports continued issues with sleep and that she slept most of yesterday and has overslept her alarm the past two days. Pt  reports sleeping but not feeling rested. Pt reports some improvement in relationship with husband. Pt struggles with consistency. Patient able to process. Patient engaged in discussion.        Session Time: 10:00 am - 11:00 am   Participation Level: Active   Behavioral Response: CasualAlertDepressed   Type of Therapy: Group Therapy   Treatment Goals addressed: Coping   Progress Towards Goals: Progressing   Interventions: CBT, DBT, Solution Focused, Strength-based, Supportive, and Reframing   Therapist Response: Cln led processing group for pt's current struggles. Group members shared stressors and provided support and feedback. Cln brought in topics of boundaries, healthy relationships, and unhealthy thought processes to inform discussion.   Therapist Response: Pt able to process and provide support to group.            Session Time: 11:00 -12:00   Participation Level: Active   Behavioral Response: CasualAlertDepressed   Type of Therapy: Group Therapy, Spiritual Care   Treatment Goals addressed: Coping   Progress Towards Goals: Progressing   Interventions: Supportive, Education   Summary:  [name], Chaplain, led group.   Therapist Response: Pt participated        Session Time: 12:00 -1:00   Participation Level: Active   Behavioral Response: CasualAlertDepressed   Type of Therapy: Group Therapy   Treatment Goals addressed: Coping   Progress Towards Goals: Progressing   Interventions: CBT, DBT, Solution Focused, Strength-based, Supportive, and Reframing   Summary: Cln introduced topic of DBT Self-Soothe skills. Group discussed ways they can utilize the five senses to soothe themselves when struggling.  12:50 -1:00 Clinician led check-out. Clinician assessed for immediate needs, medication compliance and efficacy, and safety concerns  Therapist Response: 12:00 - 12:50: Pt engaged in discussion and is able to determine ways to utilize each of the five  senses. 12:50 - 1:00 pm: At check-out, patient reports no immediate concerns. Patient demonstrates progress as evidenced by improving relationships. Patient denies SI/HI/self-harm thoughts at the end of group.    Suicidal/Homicidal: Nowithout intent/plan  Plan: Pt will continue in PHP while working to decrease depression and anxiety symptoms, increase ability to manage stressors, and increase ability to manage symptoms in a healthy manner.   Collaboration of Care: Medication Management AEB T Lewis  Patient/Guardian was advised Release of Information must be obtained prior to any record release in order to collaborate their care with an outside provider. Patient/Guardian was advised if they have not already done so to contact the registration department to sign all necessary forms in order for [place] to release information regarding their care.   Consent: Patient/Guardian gives verbal consent for treatment and assignment of benefits for services provided during this visit. Patient/Guardian expressed understanding and agreed to proceed.   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 Generalized anxiety disorder      Donia Guiles, LCSW

## 2022-02-25 ENCOUNTER — Ambulatory Visit (INDEPENDENT_AMBULATORY_CARE_PROVIDER_SITE_OTHER): Payer: No Payment, Other | Admitting: Mental Health

## 2022-02-25 DIAGNOSIS — F411 Generalized anxiety disorder: Secondary | ICD-10-CM | POA: Diagnosis not present

## 2022-02-25 DIAGNOSIS — F0632 Mood disorder due to known physiological condition with major depressive-like episode: Secondary | ICD-10-CM

## 2022-02-25 NOTE — Progress Notes (Signed)
THERAPIST PROGRESS NOTE Virtual Visit via Video Note  I connected with [name] on 02/25/22 at 12:30 PM EDT by a video enabled telemedicine application and verified that I am speaking with the correct person using two identifiers.  Location: Patient: address on file Provider: office   I discussed the limitations of evaluation and management by telemedicine and the availability of in person appointments. The patient expressed understanding and agreed to proceed.  I discussed the assessment and treatment plan with the patient. The patient was provided an opportunity to ask questions and all were answered. The patient agreed with the plan and demonstrated an understanding of the instructions.   The patient was advised to call back or seek an in-person evaluation if the symptoms worsen or if the condition fails to improve as anticipated.  I provided 50 minutes of non-face-to-face time during this encounter.   [name], [facility]   Session Time: 12:35pm (50 min)  Participation Level: Active  Behavioral Response: CasualAlertDysphoric  Type of Therapy: Individual Therapy  Treatment Goals addressed: Francene decrease sxs of depression AEB development of x 3 effective coping skills with ability to reframe maladaptive thinking patterns 7/7 days or the next x 6 months   ProgressTowards Goals: Progressing  Interventions: CBT and Supportive  Summary: KABRIA HETZER is a 46 y.o. female who presents with dx of depression and anxiety. Nakira presents lethargic and oriented; mood and affect low. Engaged and receptive to interventions. Shakirah reports mood low secondary to feelings of pain. Shares thoughts of desire to engae in community more and reports various plans to attend events. Shares thoughts of marriage and unsure at times inf she would like to remain in marriage. Shares for working to explore options for healthy foods however difficulty with husband brining food items  in the home in which she does not want to eat. States ongoing difficulty with attending to personal hygiene due to pain and able to explore ability to engage in hygiene and explore ways she can be creative and make adaptable changes to support in completion. Engaged with therapist in exploration of taking advantage of days in which pain in tolerable and working to increase quality of life.  Engagement in coping skill of playing video games working to incorporate abilty to challenge thoughts to reframe maladaptive thinking.   Suicidal/Homicidal: Nowithout intent/plan  Therapist Response: Therapist engaged Leita in tele-therapy session. Confirmed location and ability to hold confidential session. Completed check in and assessed for current level of functioning, sxs management and level of functioning. Supported Music therapist in processing thoughts and feelings related to low mood and factors that contribute. Explored ability to use coping skills and engaging in activities in which she enjoys as she is able. Encouraged daily medication compliance, healthy diet and working to increase attendance to community as able. Supported in processing thoughts and feelings related to marriage and supported in processing thoughts in balanced framework and ability to reframe thoughts as needed to reduce feelings and manage depression. Reviewed session and provided follow up appointment.   Plan: Return again in  x2 weeks.  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 Patient/Guardian was advised if they have not already done so to contact the registration department to sign all necessary forms in order  for [place] to release information regarding their  care.   Consent: Patient/Guardian gives verbal consent for treatment and assignment of benefits for services provided during this visit. Patient/Guardian expressed understanding and agreed to proceed.   Rockey Situ [place], [facility] 02/25/2022

## 2022-02-26 NOTE — Plan of Care (Signed)
  Problem: Depression CCP Problem  1 Coping with Depression Goal: STG: Catherine Koch decrease sxs of depression AEB development of x 3 effective coping skills with ability to reframe maladaptive thinking patterns 7/7 days or the next x 6 months  Outcome: Progressing   Problem: Depression CCP Problem  1 Coping with Depression Goal: LTG: Reduce frequency, intensity, and duration of depression symptoms as evidenced by score of 5 or less on PHQ-9 Outcome: Not Applicable   Problem: Anxiety Disorder CCP Problem  1 GAD Goal: LTG: Patient will score less than 5 on the Generalized Anxiety Disorder 7 Scale (GAD-7) Outcome: Not Applicable Goal: STG: Catherine Koch will increase management of stress and anxiety AEB development of problem solving skills with use as needed and utilization of x 3 distress tolerance skills within the next x 6 months  Outcome: Not Applicable Goal: STG: Catherine Koch will decrease sxs of anxiety AEB engagement and practice in relaxation and grounding coping skills 7/7 days for the next 6 months.  Outcome: Not Applicable

## 2022-03-12 ENCOUNTER — Ambulatory Visit (INDEPENDENT_AMBULATORY_CARE_PROVIDER_SITE_OTHER): Payer: No Payment, Other | Admitting: Mental Health

## 2022-03-12 DIAGNOSIS — F331 Major depressive disorder, recurrent, moderate: Secondary | ICD-10-CM

## 2022-03-12 NOTE — Plan of Care (Signed)
  Problem: Depression CCP Problem  1 Coping with Depression Goal: STG: Ruhani decrease sxs of depression AEB development of x 3 effective coping skills with ability to reframe maladaptive thinking patterns 7/7 days or the next x 6 months  Outcome: Progressing   

## 2022-03-12 NOTE — Progress Notes (Signed)
THERAPIST PROGRESS NOTE Virtual Visit via Video Note  I connected with Catherine Koch on 03/12/22 at 10:00 AM EDT by a video enabled telemedicine application and verified that I am speaking with the correct person using two identifiers.  Location: Patient: address on file Provider: office    I discussed the limitations of evaluation and management by telemedicine and the availability of in person appointments. The patient expressed understanding and agreed to proceed.  I discussed the assessment and treatment plan with the patient. The patient was provided an opportunity to ask questions and all were answered. The patient agreed with the plan and demonstrated an understanding of the instructions.   The patient was advised to call back or seek an in-person evaluation if the symptoms worsen or if the condition fails to improve as anticipated.  I provided 39 minutes of non-face-to-face time during this encounter.   [name] [place], [facility]   Session Time: 10: 06 am ( 39 minutes)   Participation Level: Active  Behavioral Response: CasualDrowsyDysphoric  Type of Therapy: Individual Therapy  Treatment Goals addressed: STG: [name] will decrease sxs of anxiety AEB engagement and practice in relaxation and grounding coping skills 7/7 days for the next 6 months.   STG: [name] decrease sxs of depression AEB development of x 3 effective coping skills with ability to reframe maladaptive thinking patterns 7/7 days or the next x 6 months  ProgressTowards Goals: Progressing  Interventions: CBT and Supportive  Summary: Catherine Koch is a 46 y.o. female who presents with dx of depression and anxiety. [name] presents lethargic and oriented; mood and affect low. Engaged with some resistance to interventions. Shares to have had difficulty with sleep previous night and feeling drowsy. Shares physical pain and planning to see her PCP today and will discuss ability to take additional  medications for pain management. Shares to have attended a dinner with family for her and her husband's birthdays. Shares some anxiousness surrounding event due to concern for parents to ask about finances and her ability to obtain employment. Shares for parents to help them out financially at times. Reports for them to not understand her medical concerns and degree of pain. Shares to have not worked since 2020. Shares attending a Ghost hunt for x 2 hours and reports levels of pain. Shares to spend large amount of time in bed or on computer gaming in hopes of making money from gaming. Resist therapist suggestions to work to decrease amount of time in bed and spending some time outdoors to support in increased energy levels. Shares pain on some days to be severe and unable to get out of bed or does not want to go outside at times. Shares concerns for husband to have not showered in x 6 months and she showers x 1 weekly siting husband's concern for finances and shares her pain. Denies SI/HI.  Shares relaxes and copes with video games; coping with depression  Suicidal/Homicidal: Nowithout intent/plan  Therapist Response:  Therapist engaged [name] in tele-therapy session. Confirmed location and ability to hold confidential session. Completed check in and assessed for current level of functioning, sxs management and level of functioning. Supported Music therapist in processing concerns for pain and encouraged follow up with provider to express concerns for pain and limiting quality of life. Educated being in bed with little activity does not support in adequate energy levels and suggested working to reduce amount of time she is in the bed. Discussed ability to increase amount of days in which  she takes showers and educated on benefits. Encouraged working to build daily routine to support in Mudlogger of anxiety and depression. Reviewed session and provided follow up.    Plan: Return again in  x2 weeks.  Diagnosis: Major  depressive disorder, recurrent episode, moderate ([place])  Collaboration of Care: Other None  Patient/Guardian was advised Release of Information must be obtained prior to any record release in order to collaborate their care with an outside provider. Patient/Guardian was advised if they have not already done so to contact the registration department to sign all necessary forms in order for [place] to release information regarding their care.   Consent: Patient/Guardian gives verbal consent for treatment and assignment of benefits for services provided during this visit. Patient/Guardian expressed understanding and agreed to proceed.   Rockey Situ [place], [facility] 03/12/2022

## 2022-03-29 ENCOUNTER — Ambulatory Visit (INDEPENDENT_AMBULATORY_CARE_PROVIDER_SITE_OTHER): Payer: No Payment, Other | Admitting: Mental Health

## 2022-03-29 DIAGNOSIS — F331 Major depressive disorder, recurrent, moderate: Secondary | ICD-10-CM | POA: Diagnosis not present

## 2022-03-29 DIAGNOSIS — F411 Generalized anxiety disorder: Secondary | ICD-10-CM

## 2022-03-29 NOTE — Plan of Care (Signed)
  Problem: Depression CCP Problem  1 Coping with Depression Goal: STG: Roux decrease sxs of depression AEB development of x 3 effective coping skills with ability to reframe maladaptive thinking patterns 7/7 days or the next x 6 months  Outcome: Progressing

## 2022-03-29 NOTE — Progress Notes (Unsigned)
THERAPIST PROGRESS NOTE Virtual Visit via Video Note  I connected with Catherine Koch on 03/29/22 at  1:00 PM EST by a video enabled telemedicine application and verified that I am speaking with the correct person using two identifiers.  Location: Patient: home address Provider: office    I discussed the limitations of evaluation and management by telemedicine and the availability of in person appointments. The patient expressed understanding and agreed to proceed.  I discussed the assessment and treatment plan with the patient. The patient was provided an opportunity to ask questions and all were answered. The patient agreed with the plan and demonstrated an understanding of the instructions.   The patient was advised to call back or seek an in-person evaluation if the symptoms worsen or if the condition fails to improve as anticipated.  I provided 47 minutes of non-face-to-face time during this encounter.   [name], [facility]   Session Time: 1: 04pm (47 minutes)  Participation Level: Active  Behavioral Response: CasualAlertDysphoric  Type of Therapy: Individual Therapy  Treatment Goals addressed: STG: Catherine Koch will decrease sxs of anxiety AEB engagement and practice in relaxation and grounding coping skills 7/7 days for the next 6 months.    STG: Catherine Koch decrease sxs of depression AEB development of x 3 effective coping skills with ability to reframe maladaptive thinking patterns 7/7 days or the next x 6 months   ProgressTowards Goals: Progressing  Interventions: CBT and Supportive  Summary: Catherine Koch is a 46 y.o. female who presents with dx of depression and anxiety. Kadesia presents lethargic and oriented; mood and affect low. Engaged with some resistance to interventions. Shares ongoing pain in which continues to prohibit her from engagign in day to day activities. Shares better sleep with use of CBD gummies given to her by her sister but shares this has  not supported In increase energy levels throughout the day and continues to have insomnia. Shares stress related to finances and house being in disarray and reports unable to work due to high degree of pain. Shares for husband to continue to not shower which prohibts her desire to engage in sexual activity. Denies ability to shower daily due to pain and shares at times can shower with use of shower chair. Shares feelings of not having control. Denies abiltiy to engage in minimal working out due to pain and resisient to clinciain suggestion of chair work outs and shares unable to lift leg. Shares with presentation to community most recently was able to push self to walk around [place]. Shares plans to attend convention. Shares video games serves as means of coping and allows her to escape. Shares cooking has been an enjoyable past time but shares is unable to complete that as well due to pain. Shares to have received denial from disability but lawyer is continuing to pursue. Plans to engage in pain management when able to obtain full medicaid vs. [place] planning. Denies safety concerns.  Shares some ability to manage depression and anxiety with coping skills of vide games. Resistant to other behavioral changes to support in incease management of moods reporting unable to engage due to levels of apin.    Suicidal/Homicidal: Nowithout intent/plan  Therapist Response: Therapist engaged [place] session. Confirmed location and ability to hold confidential session. Completed check in and assessed for current level of functioning, sxs management and level of functioning. Supported [name] in processing concerns for  presence of pain. Provided support and encouragement; validated feelings. Provided supportive feedback.  Explored with Taylinn ability to increase activity during the day and discouraged staying in bed large duration of the day. Encouraged working to take a shower daily and engage in level of  activity that she feels she is able to bear and with ability to take breaks in between.Explored ability to use ability to manage through outings in which she would like to attend to explore that motivation to complete small daily activities within the home. Provided support and encouragement. Provided support in working through concerns for marriage and lack of intimacy. Encouraged ongoing communication. Reviewed session and provided follow up. Assessed for safety.   Plan: Return again in  x2  weeks.  Diagnosis: Major depressive disorder, recurrent episode, moderate (HCC)  Generalized anxiety disorder  Collaboration of Care: Other None  Patient/Guardian was advised Release of Information must be obtained prior to any record release in order to collaborate their care with an outside provider. Patient/Guardian was advised if they have not already done so to contact the registration department to sign all necessary forms in order for [place] to release information regarding their care.   Consent: Patient/Guardian gives verbal consent for treatment and assignment of benefits for services provided during this visit. Patient/Guardian expressed understanding and agreed to proceed.   [name] [place], [facility] 03/29/2022

## 2022-04-12 ENCOUNTER — Telehealth (HOSPITAL_COMMUNITY): Payer: Self-pay | Admitting: Mental Health

## 2022-04-12 ENCOUNTER — Ambulatory Visit (INDEPENDENT_AMBULATORY_CARE_PROVIDER_SITE_OTHER): Payer: No Payment, Other | Admitting: Mental Health

## 2022-04-12 DIAGNOSIS — F411 Generalized anxiety disorder: Secondary | ICD-10-CM

## 2022-04-12 DIAGNOSIS — F331 Major depressive disorder, recurrent, moderate: Secondary | ICD-10-CM

## 2022-04-12 NOTE — Plan of Care (Signed)
  Problem: Depression CCP Problem  1 Coping with Depression Goal: STG: Dessa decrease sxs of depression AEB development of x 3 effective coping skills with ability to reframe maladaptive thinking patterns 7/7 days or the next x 6 months  Outcome: Progressing

## 2022-04-12 NOTE — Telephone Encounter (Signed)
Therapist sent link to connect to [facility] for vitual therapy session. No response after x 10 minutes. Therapist contacted pt via telephone. No answer; left message to reschedule.

## 2022-04-12 NOTE — Progress Notes (Signed)
THERAPIST PROGRESS NOTE Virtual Visit via Video Note  I connected with Catherine Koch on 04/12/22 at  1:00 PM EST by a video enabled telemedicine application and verified that I am speaking with the correct person using two identifiers.  Location: Patient: home address on file Provider: home office    I discussed the limitations of evaluation and management by telemedicine and the availability of in person appointments. The patient expressed understanding and agreed to proceed.  I discussed the assessment and treatment plan with the patient. The patient was provided an opportunity to ask questions and all were answered. The patient agreed with the plan and demonstrated an understanding of the instructions.   The patient was advised to call back or seek an in-person evaluation if the symptoms worsen or if the condition fails to improve as anticipated.  I provided 26 minutes of non-face-to-face time during this encounter.   [name], [facility]   Session Time: 1:32pm (26 minutes)  Participation Level: Active  Behavioral Response: CasualAlertAdequate  Type of Therapy: Individual Therapy  Treatment Goals addressed: STG: Avo will decrease sxs of anxiety AEB engagement and practice in relaxation and grounding coping skills 7/7 days for the next 6 months.    STG: Tuesday decrease sxs of depression AEB development of x 3 effective coping skills with ability to reframe maladaptive thinking patterns 7/7 days or the next x 6 months   ProgressTowards Goals: Progressing  Interventions: CBT and Supportive  Summary: Catherine Koch is a 46 y.o. female who presents with dx of depression and anxiety. Catherine Koch presents alert and oriented; mood and affect adequate. Speech clear and coherent at normal rate and tone. Engaged with therapy session. Shares to continue to have difficulty managing depression and shares to have presented to outing for convention and to have felt increased  fatigue as a result when she is usually able to tolerate engagement. Shares for medication for focus to have been helpful but causing concerns with sleeping patterns. Shares ongoing difficulty engaging in ADL's and performs infrequently due to pain although reports to have shower chair. Resistant to clinician suggestion of showering to be useful in behavioral tailoring. Shares looking forward to ability to engage in pain management with medicaid insurance to start December 1.Shares hopeful of ability to increase quality of life. Ongoing concerns for husband, his lakc of management of fiances and refusing to shower for what she reports is over a year. Ongoing coping with playing video games reported for depression and anxiety. Difficulty engaging in new behaviors for increased level of functioning. Denies SI/HI   Suicidal/Homicidal: Nowithout intent/plan  Therapist Response: Therapist engaged Catherine Koch in tele-therapy session. Confirmed location and ability to hold confidential session. Completed check in and assessed for current level of functioning, sxs management and level of functioning. Supported Music therapist in processing concerns for  presence of pain. Provided support and encouragement and encouraged use of showering and engaging in small activities during the day to increase management of depression. Educated on behavioral tailoring supporting in leading to additional changes. Encouraged ability to follow up with needed appointments as first of month continues. Supported in processing concerns for marriage. Reviewed session and provided follow up.   Plan: Return again in  x 2 weeks.  Diagnosis: Major depressive disorder, recurrent episode, moderate (HCC)  Generalized anxiety disorder  Collaboration of Care: Other None  Patient/Guardian was advised Release of Information must be obtained prior to any record release in order to collaborate their care with an outside  provider. Patient/Guardian was advised  if they have not already done so to contact the registration department to sign all necessary forms in order for [place] to release information regarding their care.   Consent: Patient/Guardian gives verbal consent for treatment and assignment of benefits for services provided during this visit. Patient/Guardian expressed understanding and agreed to proceed.   [name] [place], [facility] 04/12/2022

## 2022-04-27 ENCOUNTER — Ambulatory Visit (INDEPENDENT_AMBULATORY_CARE_PROVIDER_SITE_OTHER): Payer: Medicaid Other | Admitting: Mental Health

## 2022-04-27 DIAGNOSIS — F331 Major depressive disorder, recurrent, moderate: Secondary | ICD-10-CM | POA: Diagnosis not present

## 2022-04-27 DIAGNOSIS — F411 Generalized anxiety disorder: Secondary | ICD-10-CM

## 2022-04-27 NOTE — Progress Notes (Signed)
THERAPIST PROGRESS NOTE Virtual Visit via Video Note  I connected with Catherine Koch on 04/27/22 at  3:30 PM EST by a video enabled telemedicine application and verified that I am speaking with the correct person using two identifiers.  Location: Patient: home address on file Provider: home office   I discussed the limitations of evaluation and management by telemedicine and the availability of in person appointments. The patient expressed understanding and agreed to proceed.  I discussed the assessment and treatment plan with the patient. The patient was provided an opportunity to ask questions and all were answered. The patient agreed with the plan and demonstrated an understanding of the instructions.   The patient was advised to call back or seek an in-person evaluation if the symptoms worsen or if the condition fails to improve as anticipated.  I provided 53 minutes of non-face-to-face time during this encounter.   Catherine Koch, [facility]   Session Time: 3:35pm (53 minutes)   Participation Level: Active  Behavioral Response: CasualAlertDysphoric  Type of Therapy: Individual Therapy  Treatment Goals addressed: STG: Alline will decrease sxs of anxiety AEB engagement and practice in relaxation and grounding coping skills 7/7 days for the next 6 months.    STG: Catherine Koch decrease sxs of depression AEB development of x 3 effective coping skills with ability to reframe maladaptive thinking patterns 7/7 days or the next x 6 months   ProgressTowards Goals: Progressing  Interventions: CBT and Supportive  Summary:Catherine Koch is a 46 y.o. female who presents with dx of depression and anxiety. Catherine Koch presents alert and oriented; mood and affect adequate. Speech clear and coherent at normal rate and tone. Engaged with therapy session. Shares working to manage moods secondary to feelings of pain in back and body. Shares to be happy to be able to have Medicaid and ability to  obtain needed doctors appointments for needed treatment to manage pain and obtain needed testing. Shares has been under the weather with upper respiratory infection and not able to engage in playing video games as usual. 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 Shares desire to work to clean home and able to engage with therapist and explore ability to make small goals and identify areas to start to support in increasing cleanliness of home and increased satisfaction in daily life. Shares to also have plans to spend time with sisters to take pictures and have lunch together. Explored working to increase engagement with sisters and exploration of ability to increase attendance in community. Shares ongoing thoughts of concerns for marriage. Denies SI/HI Working to Sport and exercise psychologist of anxiety and shares use of coping skills cognitive coping, playing with pets, video games  Working to reframe maladaptive thoughts as needed and engage in balanced thinking Suicidal/Homicidal: Nowithout intent/plan  Therapist Response:  Therapist engaged Catherine Koch in tele-therapy session. Confirmed location and ability to hold confidential session. Completed check in and assessed for current level of functioning, sxs management and level of functioning. Supported Music therapist in processing thoughts and feelings. Supported in processing thoughts of ability to obtain needed appointments and explored working to increase level of functioning. Discussed working to engage in one small task for behavioral tailing and working to identify one small tasks in one area of home and working to complete. Explored  working within her physical limitations but working to engage in at least x 1 activity daily. Encouraged ongoing engagement with sister and ability to normalize  lunch dates with sisters for improvement in quality of life and decreasing feelings of depression. Reviewed session; provided follow up appointment and assessed for safety concerns.   Plan: Return again in  x 2 weeks.  Diagnosis: Major depressive disorder, recurrent episode, moderate (HCC)  Generalized anxiety disorder  Collaboration of Care: Other None  Patient/Guardian was advised Release of Information must be obtained prior to any record release in order to collaborate their care with an outside provider. Patient/Guardian was advised if they have not already done so to contact the registration department to sign all necessary forms in order for [place] to release information regarding their care.   Consent: Patient/Guardian gives verbal consent for treatment and assignment of benefits for services provided during this visit. Patient/Guardian expressed understanding and agreed to proceed.   Catherine Koch [place], [facility] 04/27/2022

## 2022-05-03 ENCOUNTER — Encounter (HOSPITAL_COMMUNITY): Payer: Self-pay | Admitting: Psychiatry

## 2022-05-03 ENCOUNTER — Telehealth (INDEPENDENT_AMBULATORY_CARE_PROVIDER_SITE_OTHER): Payer: Medicaid Other | Admitting: Psychiatry

## 2022-05-03 DIAGNOSIS — F330 Major depressive disorder, recurrent, mild: Secondary | ICD-10-CM | POA: Diagnosis not present

## 2022-05-03 DIAGNOSIS — F411 Generalized anxiety disorder: Secondary | ICD-10-CM | POA: Diagnosis not present

## 2022-05-03 MED ORDER — HYDROXYZINE HCL 25 MG PO TABS: 25.0000 mg | ORAL_TABLET | Freq: Three times a day (TID) | ORAL | 3 refills | Status: DC

## 2022-05-03 MED ORDER — TRAZODONE HCL 100 MG PO TABS: 100.0000 mg | ORAL_TABLET | Freq: Every evening | ORAL | 3 refills | Status: DC | PRN

## 2022-05-03 MED ORDER — DULOXETINE HCL 40 MG PO CPEP: 80.0000 mg | ORAL_CAPSULE | Freq: Every day | ORAL | 3 refills | Status: DC

## 2022-05-03 NOTE — Progress Notes (Signed)
BH MD/PA/NP OP Progress Note Virtual Visit via Video Note  I connected with Catherine Koch on 05/03/22 at 11:30 AM EST by a video enabled telemedicine application and verified that I am speaking with the correct person using two identifiers.  Location: Patient: Home Provider: Clinic   I discussed the limitations of evaluation and management by telemedicine and the availability of in person appointments. The patient expressed understanding and agreed to proceed.  I provided 30 minutes of non-face-to-face time during this encounter.    05/03/2022 12:04 PM Catherine Koch  MRN:  [id number]  Chief Complaint: "I have been going with the flow"   HPI: 46 year old female seen today  for follow-up psychiatric evaluation. She has a history of auditory processing disorder (as a child), depression, PTSD,  and anxiety, and is currently managed on Hydroxyzine 25mg  three times daily as needed, Strattera 40 mg Cymbalta 80 mg daily, and Trazodone 150 mg at bedtime. She informed that she discontinued Strattera due headaches and poor sleep.    Today she was well groomed, pleasant, cooperative, and engaged in conversation. She notes that she has been going with the flow. She notes that some days she is okay and other days she is not. At times she reports that her anxiety is high. She notes that she worries about her/her husbands health, finances, and her home. Provider conducted a GAD 7 and patient scored a  13, at her last visit she scored an 18. Provider also conducted a PHQ 9 and patient scored a 15, at her last visit she scored a 21. She endorses fluctuations in appetite and sleep. Patient notes that some nights she has had problems falling asleep but reports that when she does sleep it is sound. Today she denies SI/HI/VAH, mania, or paranoia.  Patient informed Clinical research associate that she discontinued Strattera because she felt that it caused poor sleep and headaches at past visit patient notes that she felt  that Strattera negatively affected metformin. She reports that she continue to have symptoms of ADHD noting that she has been distractible, forgetful, inattentive to mentally taxing task, and often is disorganized.  Patient informed Clinical research associate that she feels that her auditory processing disorder contributes to the above. Provider asked patient if she followed back up with [facility] and she reports that she missed their call but was planning to call back to schedule an appointment. At this time patient does not wish to start a medication for ADHD. Strattera discontinued today.She reports that she has not had her antidepressants in a few days as she is waiting on a renewal from [facility].  Patient agreeable to continue medications as  prescribed. No other concerns noted at this time.       Visit Diagnosis:    ICD-10-CM   1. Mild episode of recurrent major depressive disorder (HCC)  F33.0 DULoxetine HCl 40 MG CPEP    traZODone (DESYREL) 100 MG tablet    2. Generalized anxiety disorder  F41.1 hydrOXYzine (ATARAX) 25 MG tablet      Past Psychiatric History: Auditory processing disorder (as a child), PTSD, anxiety, and depression  Past Medical History:  Past Medical History:  Diagnosis Date   ADHD    Anxiety    Arthritis    pt. states in knees   Chronic gastritis    Chronic sinus infection    Closed fracture of right distal fibula 11/19/2013   Complication of anesthesia    pt. states difficult to wake up   Depression  GERD (gastroesophageal reflux disease)    Headache    pt. states random migraines   Hemorrhoids    High cholesterol    History of bronchitis    History of degenerative disc disease    Hypertension    Ovarian failure    PTSD (post-traumatic stress disorder)    Shortness of breath    exertion from crutches   Sleep apnea    cpap   UTI (lower urinary tract infection)    Wears glasses     Past Surgical History:  Procedure Laterality Date   BIOPSY  12/10/2019   Procedure:  BIOPSY;  Surgeon: Napoleon Form, MD;  Location: [facility];  Service: Endoscopy;;   CHOLECYSTECTOMY  2010   CHOLECYSTECTOMY     COLONOSCOPY WITH PROPOFOL N/A 12/10/2019   Procedure: COLONOSCOPY WITH PROPOFOL;  Surgeon: Napoleon Form, MD;  Location: [facility];  Service: Endoscopy;  Laterality: N/A;   ESOPHAGOGASTRODUODENOSCOPY     ESOPHAGOGASTRODUODENOSCOPY (EGD) WITH PROPOFOL N/A 12/10/2019   Procedure: ESOPHAGOGASTRODUODENOSCOPY (EGD) WITH PROPOFOL;  Surgeon: Napoleon Form, MD;  Location: [facility];  Service: Endoscopy;  Laterality: N/A;   NASAL SEPTOPLASTY W/ TURBINOPLASTY Bilateral 12/11/2014   Procedure: NASAL SEPTOPLASTY AND BILATERAL INFERIOR TURBINATE REDUCTION;  Surgeon: Osborn Coho, MD;  Location: [facility] OR;  Service: ENT;  Laterality: Bilateral;   ORIF FIBULA FRACTURE Right 11/19/2013   Procedure: OPEN REDUCTION INTERNAL FIXATION (ORIF) RIGHT FIBULA FRACTURE;  Surgeon: Eulas Post, MD;  Location: [facility] OR;  Service: Orthopedics;  Laterality: Right;   WISDOM TOOTH EXTRACTION      Family Psychiatric History: Sister anxiety and depression. Notes mother has untreated mental health conditions  Family History:  Family History  Problem Relation Age of Onset   Depression Sister    Allergies Sister    Cancer Paternal Grandfather    Heart disease Paternal Grandmother    Cancer Paternal Grandmother        BREAST CANCER    Social History:  Social History   Socioeconomic History   Marital status: Married    Spouse name: Not on file   Number of children: 0   Years of education: Not on file   Highest education level: Some college, no degree  Occupational History    Comment: UNEMPOLYEED   Tobacco Use   Smoking status: Former    Types: Cigarettes    Quit date: 05/24/2010    Years since quitting: 11.9    Passive exposure: Never   Smokeless tobacco: Never   Tobacco comments:    Pt. states she used to be a social smoker  Vaping Use   Vaping Use: Never used   Substance and Sexual Activity   Alcohol use: Yes   Drug use: No   Sexual activity: Yes    Birth control/protection: None  Other Topics Concern   Not on file  Social History Narrative   Not on file   Social Determinants of Health   Financial Resource Strain: High Risk (01/06/2022)   Overall Financial Resource Strain (CARDIA)    Difficulty of Paying Living Expenses: Hard  Food Insecurity: Food Insecurity Present (01/06/2022)   Hunger Vital Sign    Worried About Running Out of Food in the Last Year: Sometimes true    Ran Out of Food in the Last Year: Sometimes true  Transportation Needs: No Transportation Needs (01/06/2022)   PRAPARE - Administrator, Civil Service (Medical): No    Lack of Transportation (Non-Medical): No  Physical Activity: Inactive (  01/06/2022)   Exercise Vital Sign    Days of Exercise per Week: 0 days    Minutes of Exercise per Session: 0 min  Stress: Stress Concern Present (01/06/2022)   [employer] of Occupational Health - Occupational Stress Questionnaire    Feeling of Stress : Very much  Social Connections: Socially Isolated (01/06/2022)   Social Connection and Isolation Panel [NHANES]    Frequency of Communication with Friends and Family: Once a week    Frequency of Social Gatherings with Friends and Family: Never    Attends Religious Services: Never    Database administrator or Organizations: No    Attends Banker Meetings: Never    Marital Status: Married    Allergies:  Allergies  Allergen Reactions   Bee Pollen Anaphylaxis, Swelling, Hives and Itching    Other reaction(s): eye redness   Sulfa Antibiotics Anaphylaxis, Swelling and Rash    Throat swelling/ rashes    Metabolic Disorder Labs: No results found for: "HGBA1C", "MPG" No results found for: "PROLACTIN" No results found for: "CHOL", "TRIG", "HDL", "CHOLHDL", "VLDL", "LDLCALC" Lab Results  Component Value Date   TSH 1.90 04/11/2012    Therapeutic Level  Labs: No results found for: "LITHIUM" No results found for: "VALPROATE" No results found for: "CBMZ"  Current Medications: Current Outpatient Medications  Medication Sig Dispense Refill   acetaminophen (TYLENOL) 500 MG tablet Take 1,000 mg by mouth every 6 (six) hours as needed for fever.     colesevelam (WELCHOL) 625 MG tablet Take 1 tablet (625 mg total) by mouth daily with breakfast. 90 tablet 3   diclofenac (VOLTAREN) 75 MG EC tablet Take 75 mg by mouth 2 (two) times daily. (Patient not taking: Reported on 02/09/2022)     DULoxetine HCl 40 MG CPEP Take 80 mg by mouth daily. 60 capsule 3   hydrOXYzine (ATARAX) 25 MG tablet Take 1 tablet (25 mg total) by mouth 3 (three) times daily. 90 tablet 3   IBU 600 MG tablet Take 600 mg by mouth 3 (three) times daily.     metFORMIN (GLUMETZA) 500 MG (MOD) 24 hr tablet Take 500 mg by mouth daily with breakfast.     methocarbamol (ROBAXIN) 500 MG tablet Take 2 tablets (1,000 mg total) by mouth 2 (two) times daily. 20 tablet 0   metoprolol tartrate (LOPRESSOR) 25 MG tablet Take 25 mg by mouth 2 (two) times daily.     misoprostol (CYTOTEC) 200 MCG tablet Take 200 mcg by mouth 2 (two) times daily as needed (stomach ulcers).     omeprazole (PRILOSEC) 40 MG capsule Take 1 capsule (40 mg total) by mouth daily. 90 capsule 3   ondansetron (ZOFRAN ODT) 4 MG disintegrating tablet Take 1 tablet (4 mg total) by mouth every 8 (eight) hours as needed for nausea or vomiting. 8 tablet 0   spironolactone (ALDACTONE) 25 MG tablet Take 25 mg by mouth daily.     traZODone (DESYREL) 100 MG tablet Take 1-1.5 tablets (100-150 mg total) by mouth at bedtime as needed for sleep. 45 tablet 3   Turmeric (QC TUMERIC COMPLEX PO) Take 1 tablet by mouth daily.     No current facility-administered medications for this visit.     Musculoskeletal: Strength & Muscle Tone: within normal limits and telehealth visit Gait & Station: normal, telehealth visit Patient leans:  N/A  Psychiatric Specialty Exam: Review of Systems  There were no vitals taken for this visit.There is no height or weight on file to  calculate BMI.  General Appearance: Well Groomed  Eye Contact:  Good  Speech:  Clear and Coherent and Normal Rate  Volume:  Normal  Mood:  Anxious and Depressed, improving  Affect:  Congruent  Thought Process:  Coherent, Goal Directed and Linear  Orientation:  Full (Time, Place, and Person)  Thought Content: WDL and Logical   Suicidal Thoughts:  No  Homicidal Thoughts:  No  Memory:  Immediate;   Good Recent;   Good Remote;   Good  Judgement:  Good  Insight:  Good  Psychomotor Activity:  Normal  Concentration:  Concentration: Fair and Attention Span: Fair  Recall:  Good  Fund of Knowledge: Good  Language: Good  Akathisia:  No  Handed:  Right  AIMS (if indicated): Not done  Assets:  Communication Skills Desire for Improvement Financial Resources/Insurance Housing Intimacy Social Support  ADL's:  Intact  Cognition: WNL  Sleep:  Good   Screenings: GAD-7    Flowsheet Row [facility] Counsel[facility]20[facility]  Total GAD-7 Score 13 18 20 15 9       PHQ2-9    Flowsheet Row [facility] from [facility]2021 [facility]  PHQ-2 Total Score 3 5 5 4 4   PHQ-9 Total Score 15 21 23 18 19       Flowsheet Row Video [facility]2022 [facility] from [facility]2022 [facility] from 01/02/2022 in [facility]  C-SSRS RISK CATEGORY No Risk Low Risk No Risk        Assessment and Plan: Patient reports that her anxiety and depression has somewhat improved. She does have situational anxiety and depression but is able to cope. Patient notes that her ADHD continue to be problematic but would like to be assessed at [facility] prior to medication adjustments. Athis time patient wishes to discontinue Strattera. She reports that she has not had her antidepressants in a few days as she is waiting on a renewal from [facility]. Patient agreeable to continue medications as  prescribed.  1. Generalized anxiety disorder  Continue- hydrOXYzine (ATARAX) 25 MG tablet; Take 1 tablet (25 mg total) by mouth 3 (three) times daily.  Dispense: 90 tablet; Refill: 3  2. Mild episode of recurrent major depressive disorder (HCC)  Continue- traZODone (DESYREL) 100 MG tablet; Take 1-1.5 tablets (100-150 mg total) by mouth at bedtime as needed for sleep.  Dispense: 45 tablet; Refill: 3 Continue- DULoxetine HCl 40 MG CPEP; Take 80 mg by mouth daily.  Dispense: 60 capsule; Refill: 3     Follow-up in 3 months Follow-up with therapy   Shanna CiscoBrittney E Skylen Spiering, NP 05/03/2022, 12:04 PM

## 2022-05-26 ENCOUNTER — Ambulatory Visit (INDEPENDENT_AMBULATORY_CARE_PROVIDER_SITE_OTHER): Payer: Medicaid Other | Admitting: Mental Health

## 2022-05-26 DIAGNOSIS — F331 Major depressive disorder, recurrent, moderate: Secondary | ICD-10-CM

## 2022-05-26 DIAGNOSIS — F411 Generalized anxiety disorder: Secondary | ICD-10-CM

## 2022-05-26 NOTE — Progress Notes (Signed)
THERAPIST PROGRESS NOTE Virtual Visit via Video Note  I connected with Catherine Koch on 05/26/22 at 11:00 AM EST by a video enabled telemedicine application and verified that I am speaking with the correct person using two identifiers.  Location: Patient: home address on file Provider: office    I discussed the limitations of evaluation and management by telemedicine and the availability of in person appointments. The patient expressed understanding and agreed to proceed.  I discussed the assessment and treatment plan with the patient. The patient was provided an opportunity to ask questions and all were answered. The patient agreed with the plan and demonstrated an understanding of the instructions.   The patient was advised to call back or seek an in-person evaluation if the symptoms worsen or if the condition fails to improve as anticipated.  I provided 53 minutes of non-face-to-face time during this encounter.   Catherine Koch, [facility]   Session Time: 11:05am (53 minute)  Participation Level: Active  Behavioral Response: CasualAlertDysphoric  Type of Therapy: Individual Therapy  Treatment Goals addressed: STG: Catherine Koch will decrease sxs of anxiety AEB engagement and practice in relaxation and grounding coping skills 7/7 days for the next 6 months.    STG: Catherine Koch decrease sxs of depression AEB development of x 3 effective coping skills with ability to reframe maladaptive thinking patterns 7/7 days or the next x 6 months   ProgressTowards Goals: Progressing  Interventions: Supportive   Summary: Catherine Koch is a 47 y.o. female who presents with dx of depression and anxiety. Catherine Koch presents alert and oriented; mood and affect adequate. Speech clear and coherent at normal rate and tone. Engaged with therapy session. Shares working to manage moods adequately through use of video games and pets. Shares increased sxs of anxiety with husband stating need to make cut  backs financially with only cutting back things she wants and needs and not his wants or needs. 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 Shares difficulty with attending to needs around the house due to pain in body. Shares some feelings of hope with having been provided a date for her disability hearing. Shares to be working with lawyers to prepare. States working to obtain needed appointments and test with now [name]. Use of grounding skills x 3 days weekly; coping skills x 1 daily inconsistency in reframing maladaptive thinking. Maintaining in sxs; no regression; maintaining progress on goals. No safety concerns reported.   Suicidal/Homicidal: Nowithout intent/plan  Therapist Response: Therapist engaged Catherine Koch in [place] session. Confirmed location and ability to hold confidential session. Completed check in and assessed for current level of functioning, sxs management and level of functioning. Supported Catherine Koch in processing thoughts and feelings related to having to cut back in finances. Explored with Catherine Koch areas of progress she has been able to make with behavioral activation, use of coping skills and cognitive coping. Explored with Catherine Koch effects she believes obtaining disability payments will have on current quality of life and level of functioning. Encouraged Catherine Koch to continue to utilize coping skills and engaging in activities as she is able provided pain. Reviewed session and provided follow up   Plan: Return again in  x 3 weeks.  Diagnosis: Major depressive disorder, recurrent episode, moderate (HCC)  Generalized anxiety disorder  Collaboration of Care: Other None  Patient/Guardian was advised Release of Information must be obtained prior to any record release in order to  collaborate their care with an outside provider. Patient/Guardian was advised if they have not already done so to contact  the registration department to sign all necessary forms in order for [place] to release information regarding their care.   Consent: Patient/Guardian gives verbal consent for treatment and assignment of benefits for services provided during this visit. Patient/Guardian expressed understanding and agreed to proceed.   Catherine Koch, [facility] 05/26/2022

## 2022-05-28 ENCOUNTER — Telehealth (HOSPITAL_COMMUNITY): Payer: Self-pay | Admitting: *Deleted

## 2022-05-28 NOTE — Telephone Encounter (Signed)
Patient called stated there is a quaintly limitation on the DULoxetine HCl 40 MG CPEP  DULoxetine HCl 40 MG CPEP; Take 80 mg by mouth daily                           && Asked to go back on the DULoxetine HCl 40 MG  2 x daily

## 2022-05-31 ENCOUNTER — Other Ambulatory Visit (HOSPITAL_COMMUNITY): Payer: Self-pay | Admitting: Psychiatry

## 2022-05-31 ENCOUNTER — Telehealth (HOSPITAL_COMMUNITY): Payer: Self-pay | Admitting: Psychiatry

## 2022-05-31 DIAGNOSIS — F330 Major depressive disorder, recurrent, mild: Secondary | ICD-10-CM

## 2022-05-31 MED ORDER — DULOXETINE HCL 40 MG PO CPEP: 80.0000 mg | ORAL_CAPSULE | Freq: Every day | ORAL | 3 refills | Status: DC

## 2022-05-31 NOTE — Telephone Encounter (Signed)
Patient informed Probation officer that she was prescribed duloxetine 20 mg 4 times daily (total 80mg ).  Provider informed patient that at her last refill duloxetine was filled 80 mg (two 40 mg tablets).  She informed Probation officer that it was not filled as well and request a refill.  Provider was agreeable to this and ordered duloxetine 80 mg (two 40 mg tablets) to be taken daily.  Medication sent to [facility].  No other concerns noted at this time.

## 2022-06-02 ENCOUNTER — Telehealth (HOSPITAL_COMMUNITY): Payer: Self-pay | Admitting: Psychiatry

## 2022-06-02 ENCOUNTER — Other Ambulatory Visit (HOSPITAL_COMMUNITY): Payer: Self-pay | Admitting: Psychiatry

## 2022-06-02 DIAGNOSIS — F411 Generalized anxiety disorder: Secondary | ICD-10-CM

## 2022-06-02 DIAGNOSIS — F330 Major depressive disorder, recurrent, mild: Secondary | ICD-10-CM

## 2022-06-02 NOTE — Telephone Encounter (Signed)
Patient informed by [place] that Cymbalta needs a prior authorization.  Patient is currently taking Cymbalta 80 mg ( two 40 mg tablets). She reports that the pharmacy does not have 40 mg tablets and needs a prior authorization for the 20 mg tablets to be taken.  Provider instructed patient to have pharmacy send over prior authorization request.  She endorsed understanding and agreed.  Patient informed writer that her medications could be sent to another pharmacy if she prefers.  At this time she notes that she would like to continue at [facility].

## 2022-06-18 ENCOUNTER — Ambulatory Visit (INDEPENDENT_AMBULATORY_CARE_PROVIDER_SITE_OTHER): Payer: Medicaid Other | Admitting: Mental Health

## 2022-06-18 DIAGNOSIS — F331 Major depressive disorder, recurrent, moderate: Secondary | ICD-10-CM

## 2022-06-18 DIAGNOSIS — F411 Generalized anxiety disorder: Secondary | ICD-10-CM

## 2022-06-18 NOTE — Progress Notes (Unsigned)
THERAPIST PROGRESS NOTE Virtual Visit via Video Note  I connected with Catherine Koch on 06/18/22 at 11:00 AM EST by a video enabled telemedicine application and verified that I am speaking with the correct person using two identifiers.  Location: Patient: home address Provider: office   I discussed the limitations of evaluation and management by telemedicine and the availability of in person appointments. The patient expressed understanding and agreed to proceed.  I discussed the assessment and treatment plan with the patient. The patient was provided an opportunity to ask questions and all were answered. The patient agreed with the plan and demonstrated an understanding of the instructions.   The patient was advised to call back or seek an in-person evaluation if the symptoms worsen or if the condition fails to improve as anticipated.  I provided 55 minutes of non-face-to-face time during this encounter.   [name], [facility]   Session Time: 11:01 am ( 55 minutes)  Participation Level: Active  Behavioral Response: CasualAlertDepressed  Type of Therapy: Individual Therapy  Treatment Goals addressed: STG: Catherine Koch will decrease sxs of anxiety AEB engagement and practice in relaxation and grounding coping skills 7/7 days for the next 6 months.    STG: Catherine Koch decrease sxs of depression AEB development of x 3 effective coping skills with ability to reframe maladaptive thinking patterns 7/7 days or the next x 6 months   ProgressTowards Goals: Progressing  Interventions: CBT and Supportive  Summary:Catherine Koch is a 47 y.o. female who presents with dx of depression and anxiety. Catherine Koch presents alert and oriented; mood and affect adequate. Speech clear and coherent at normal rate and tone. Engaged with therapy session. Shares working to manage moods  and shares difficulty at times due to pain. Shares new diagnoses of Fibromyalgia and now working to manage that as well  as learned she is anemic. Shares conflict with mother over finances and having to [place] and husband with her inability to work. Shares difficulty sitting to long and difficulty standing too long. Reports ongoing engagement with provided to work to towards managing physical health sxs and providers plan to connect her to specialist. [place] working to balance emotions and feelings with stressors present secondary to health concerns but reports "more bad days than good". Shares a friend is due to move in which should support with alleviating some financial stressors. Shares need to clean home however low motivation and energy. Difficulty completing ADL's lacking needed showers. Shares upcoming hearing with disability due to high degree of medical concerns. Shares therapy to be beneficial, some ability to cope however sxs unchanged.    Suicidal/Homicidal: Nowithout intent/plan  Therapist Response: Therapist engaged Catherine Koch in [place] session. Confirmed location and ability to hold confidential session. Completed check in and assessed for current level of functioning, sxs management and level of functioning. Supported [name] in processing thoughts and feelings related to medical concerns and management of appointments. Provided supportive feedback and encouraged to obtain calendar book to assist in organization appointments and med box to support in daily medication compliance. Explored use of coping skills and severity of depression and anxiety sxs. Encouraged Miria to continue to follow up with providers and engagement in enjoyable activities and self-care as able. Encouraged to identify one small tasks daily and setting small goals. Reviewed session and  provided follow up.   Plan: Return again in  x 4 weeks.  Diagnosis: Major depressive disorder, recurrent episode, moderate (HCC)  Generalized anxiety disorder  Collaboration of Care: Other  None  Patient/Guardian was advised Release of  Information must be obtained prior to any record release in order to collaborate their care with an outside provider. Patient/Guardian was advised if they have not already done so to contact the registration department to sign all necessary forms in order for [place] to release information regarding their care.   Consent: Patient/Guardian gives verbal consent for treatment and assignment of benefits for services provided during this visit. Patient/Guardian expressed understanding and agreed to proceed.   Rockey Situ [place], [facility] 06/18/2022

## 2022-07-16 ENCOUNTER — Ambulatory Visit: Payer: Medicaid Other | Admitting: Adult Health

## 2022-07-19 ENCOUNTER — Encounter (HOSPITAL_COMMUNITY): Payer: Self-pay

## 2022-07-19 ENCOUNTER — Ambulatory Visit (HOSPITAL_COMMUNITY): Payer: Medicaid Other | Admitting: Mental Health

## 2022-07-19 ENCOUNTER — Ambulatory Visit: Payer: Medicaid Other | Admitting: Adult Health

## 2022-07-19 ENCOUNTER — Ambulatory Visit (INDEPENDENT_AMBULATORY_CARE_PROVIDER_SITE_OTHER): Payer: Medicaid Other | Admitting: Mental Health

## 2022-07-19 VITALS — BP 108/80 | HR 78

## 2022-07-19 DIAGNOSIS — F331 Major depressive disorder, recurrent, moderate: Secondary | ICD-10-CM | POA: Diagnosis not present

## 2022-07-19 DIAGNOSIS — F411 Generalized anxiety disorder: Secondary | ICD-10-CM

## 2022-07-19 DIAGNOSIS — G4733 Obstructive sleep apnea (adult) (pediatric): Secondary | ICD-10-CM

## 2022-07-19 DIAGNOSIS — F900 Attention-deficit hyperactivity disorder, predominantly inattentive type: Secondary | ICD-10-CM

## 2022-07-19 NOTE — Addendum Note (Signed)
Addended by: Vanessa Barbara on: 07/19/2022 02:14 PM   Modules accepted: Orders

## 2022-07-19 NOTE — Assessment & Plan Note (Signed)
History of severe obstructive sleep apnea.  Patient needs new CPAP supplies.  Unable to get CPAP download.  Patient CPAP machine is on line and is not connected to a local DME.  Cannot get a compliance download to order new supplies or machine.  Unable to see how well her sleep apnea is controlled.  will set patient up for CPAP titration study.  And once results are available will need new CPAP and supplies sent to local DME company  Plan  Patient Instructions  Set up for CPAP titration study . Once done will order new CPAP machine and supplies  Continue on CPAP At bedtime  , wear all night long and with naps Healthy sleep regimen  Do not drive if sleepy  Work on healthy weight loss.  Follow up in 6 months with Dr. Elsworth Soho  and As needed

## 2022-07-19 NOTE — Assessment & Plan Note (Signed)
Healthy weight loss discussed 

## 2022-07-19 NOTE — Progress Notes (Signed)
THERAPIST PROGRESS NOTE Virtual Visit via Video Note  I connected with Catherine Koch on 07/19/22 at  3:00 PM EST by a video enabled telemedicine application and verified that I am speaking with the correct person using two identifiers.  Location: Patient: home address on file Provider: office   I discussed the limitations of evaluation and management by telemedicine and the availability of in person appointments. The patient expressed understanding and agreed to proceed.  I discussed the assessment and treatment plan with the patient. The patient was provided an opportunity to ask questions and all were answered. The patient agreed with the plan and demonstrated an understanding of the instructions.   The patient was advised to call back or seek an in-person evaluation if the symptoms worsen or if the condition fails to improve as anticipated.  I provided 47 minutes of non-face-to-face time during this encounter.   [name], [facility]   Session Time: 3:01pm ( 47 minutes)   Participation Level: Active  Behavioral Response: CasualAlertAdequate  Type of Therapy: Individual Therapy  Treatment Goals addressed: STG: Asaiah will decrease sxs of anxiety AEB engagement and practice in relaxation and grounding coping skills 7/7 days for the next 6 months.    STG: Nefertiti decrease sxs of depression AEB development of x 3 effective coping skills with ability to reframe maladaptive thinking patterns 7/7 days or the next x 6 months   ProgressTowards Goals: Progressing  Interventions: CBT and Supportive  Summary: [name] is a 47 y.o. female who presents with dx of depression and anxiety. Schronda presents alert and oriented; mood and affect adequate. Speech clear and coherent at normal rate and tone. Engaged with therapy session. Shares working to manage moods "best I can" shares use of her animals, video games as means of coping. Shares minimal concerns for anxiety.  Difficulty sleeping. Shares to continue to have "good days and bad." Presents for session, laying down in bed. Notes to have had an appointment earlier in the day and then presented to out to eat for breakfast and notes for this level of activity to have been tiresome for her. Shares to have received dx of fibromyalgia and processing having levels of pain for the duration of her life. Shares difficulty attending to daily needs of showering, not able to organize or clean the home; not able to engage In exercise. Shares would like to have weight loss surgery. Able to explore working to make small changes and engaging in activity needed for short duration of time. Explored working to engage in needed and wanted tasks and exploring ways to modify activities so that she may be able to complete. Shares to be hopeful of getting disability with upcoming hearing in April. Shares ongoing financial difficulties causing discord with her parents. Denies safety concerns. Shares progress in goals with decreased anxiety and depression, use of coping skills. Sxs stable at this time.   Suicidal/Homicidal: Nowithout intent/plan  Therapist Response:  Therapist engaged Bliss in tele-therapy session. Confirmed location and ability to hold confidential session. Completed check in and assessed for current level of functioning, sxs management and level of functioning. Supported Music therapist in processing thoughts and feelings related to medical concerns and working to get engaged with needed providers. Encouraged [name] to explore ability to write down tasks needed to be completed daily and small goals. Explored working to engage in behavioral changes to support in increased quality of life and psychosocial rehabilitation. Explored working to engage in setting small daily goals  and ways to engage in showering to see if makes tasks easier on the body. Explored adjusting duration of times I which she engages in activity and setting small goals  of cleaning/organizing one area of the home. Provided education on behavioral activation. Reviewed session and provided follow up. No safety concerns noted.   Plan: Return again in x4 weeks.  Diagnosis: Major depressive disorder, recurrent episode, moderate (HCC)  Generalized anxiety disorder  Attention deficit hyperactivity disorder (ADHD), predominantly inattentive type  Collaboration of Care: Other None  Patient/Guardian was advised Release of Information must be obtained prior to any record release in order to collaborate their care with an outside provider. Patient/Guardian was advised if they have not already done so to contact the registration department to sign all necessary forms in order for [place] to release information regarding their care.   Consent: Patient/Guardian gives verbal consent for treatment and assignment of benefits for services provided during this visit. Patient/Guardian expressed understanding and agreed to proceed.   [name] [place], [facility] 07/19/2022

## 2022-07-19 NOTE — Progress Notes (Signed)
$'@Patient'y$  ID: Catherine Koch, female    DOB: [birth year], 47 y.o.   MRN: [id number]  Chief Complaint  Patient presents with   Follow-up    Referring provider: Chaney Malling, [place]  HPI: 47 year old female followed for obstructive sleep apnea  TEST/EVENTS :  NPSG 01/24/2012-severe obstructive sleep apnea. AHI 62 per hour. CPAP titration to 10 CWP/AHI 1.4 per hour  07/19/2022 Follow up : OSA  Patient presents for a 86-monthfollow-up.  Patient is on nocturnal CPAP for severe sleep apnea.  Patient says that she wears her CPAP every single night.  Cannot sleep without it.  She has gotten a new SD card but unfortunately is unable to download today.  Patient has been getting supplies online due to lack of insurance.  She has recently gotten new insurance.  Patient says she does feel tired on and off.  But is not sure if that is due to underlying fibromyalgia and chronic pain. She uses a fullface mask.  But needs new supplies.  We have been unable to get supplies without a compliance report of her CPAP.  She does not have a local DME as she got her CPAP machine online.   Allergies  Allergen Reactions   Bee Pollen Anaphylaxis, Swelling, Hives and Itching    Other reaction(s): eye redness   Sulfa Antibiotics Anaphylaxis, Swelling and Rash    Throat swelling/ rashes    Immunization History  Administered Date(s) Administered   Influenza Split 02/24/2012   Influenza,inj,Quad PF,6+ Mos 05/25/2013, 02/26/2020   PFIZER(Purple Top)SARS-COV-2 Vaccination 08/18/2019, 09/08/2019   Unspecified SARS-COV-2 Vaccination 08/13/2019, 09/08/2019    Past Medical History:  Diagnosis Date   ADHD    Anxiety    Arthritis    pt. states in knees   Chronic gastritis    Chronic sinus infection    Closed fracture of right distal fibula [id number]  Complication of anesthesia    pt. states difficult to wake up   Depression    GERD (gastroesophageal reflux disease)    Headache    pt. states random  migraines   Hemorrhoids    High cholesterol    History of bronchitis    History of degenerative disc disease    Hypertension    Ovarian failure    PTSD (post-traumatic stress disorder)    Shortness of breath    exertion from crutches   Sleep apnea    cpap   UTI (lower urinary tract infection)    Wears glasses     Tobacco History: Social History   Tobacco Use  Smoking Status Former   Types: Cigarettes   Quit date: 05/24/2010   Years since quitting: 12.1   Passive exposure: Never  Smokeless Tobacco Never  Tobacco Comments   Pt. states she used to be a social smoker   Counseling given: Not Answered Tobacco comments: Pt. states she used to be a social smoker   Outpatient Medications Prior to Visit  Medication Sig Dispense Refill   colesevelam (WELCHOL) 625 MG tablet Take 1 tablet (625 mg total) by mouth daily with breakfast. 90 tablet 3   DULoxetine HCl 40 MG CPEP Take 80 mg by mouth daily. 60 capsule 3   hydrOXYzine (ATARAX) 25 MG tablet Take 1 tablet (25 mg total) by mouth 3 (three) times daily. 90 tablet 3   IBU 600 MG tablet Take 600 mg by mouth 3 (three) times daily.     metFORMIN (GLUMETZA) 500 MG (MOD) 24 hr tablet Take 500  mg by mouth daily with breakfast.     metoprolol tartrate (LOPRESSOR) 25 MG tablet Take 25 mg by mouth 2 (two) times daily.     misoprostol (CYTOTEC) 200 MCG tablet Take 200 mcg by mouth 2 (two) times daily as needed (stomach ulcers).     omeprazole (PRILOSEC) 40 MG capsule Take 1 capsule (40 mg total) by mouth daily. 90 capsule 3   pregabalin (LYRICA) 50 MG capsule Take 50 mg by mouth 2 (two) times daily.     spironolactone (ALDACTONE) 25 MG tablet Take 25 mg by mouth daily.     Turmeric (QC TUMERIC COMPLEX PO) Take 1 tablet by mouth daily.     acetaminophen (TYLENOL) 500 MG tablet Take 1,000 mg by mouth every 6 (six) hours as needed for fever. (Patient not taking: Reported on 07/19/2022)     diclofenac (VOLTAREN) 75 MG EC tablet Take 75 mg by mouth  2 (two) times daily. (Patient not taking: Reported on 02/09/2022)     methocarbamol (ROBAXIN) 500 MG tablet Take 2 tablets (1,000 mg total) by mouth 2 (two) times daily. (Patient not taking: Reported on 07/19/2022) 20 tablet 0   ondansetron (ZOFRAN ODT) 4 MG disintegrating tablet Take 1 tablet (4 mg total) by mouth every 8 (eight) hours as needed for nausea or vomiting. (Patient not taking: Reported on 07/19/2022) 8 tablet 0   traZODone (DESYREL) 100 MG tablet Take 1-1.5 tablets (100-150 mg total) by mouth at bedtime as needed for sleep. (Patient not taking: Reported on 07/19/2022) 45 tablet 3   No facility-administered medications prior to visit.     Review of Systems:   Constitutional:   No  weight loss, night sweats,  Fevers, chills,  +fatigue, or  lassitude.  HEENT:   No headaches,  Difficulty swallowing,  Tooth/dental problems, or  Sore throat,                No sneezing, itching, ear ache, nasal congestion, post nasal drip,   CV:  No chest pain,  Orthopnea, PND, swelling in lower extremities, anasarca, dizziness, palpitations, syncope.   GI  No heartburn, indigestion, abdominal pain, nausea, vomiting, diarrhea, change in bowel habits, loss of appetite, bloody stools.   Resp: No shortness of breath with exertion or at rest.  No excess mucus, no productive cough,  No non-productive cough,  No coughing up of blood.  No change in color of mucus.  No wheezing.  No chest wall deformity  Skin: no rash or lesions.  GU: no dysuria, change in color of urine, no urgency or frequency.  No flank pain, no hematuria   MS:   Joint pain+   Physical Exam  BP 108/80 (BP Location: Left Wrist, Cuff Size: Normal)   Pulse 78   SpO2 97%   GEN: A/Ox3; pleasant , NAD, well nourished    HEENT:  [place]/AT,  NOSE-clear, THROAT-clear, no lesions, no postnasal drip or exudate noted.  Class III MP airway  NECK:  Supple w/ fair ROM; no JVD; normal carotid impulses w/o bruits; no thyromegaly or nodules  palpated; no lymphadenopathy.    RESP  Clear  P & A; w/o, wheezes/ rales/ or rhonchi. no accessory muscle use, no dullness to percussion  CARD:  RRR, no m/r/g, no peripheral edema, pulses intact, no cyanosis or clubbing.  GI:   Soft & nt; nml bowel sounds; no organomegaly or masses detected.   Musco: Warm bil, no deformities or joint swelling noted.   Neuro: alert, no focal deficits  noted.    Skin: Warm, no lesions or rashes    Lab Results:  CBC    BNP No results found for: "BNP"  ProBNP No results found for: "PROBNP"  Imaging: No results found.        No data to display          No results found for: "NITRICOXIDE"      Assessment & Plan:   Obstructive sleep apnea History of severe obstructive sleep apnea.  Patient needs new CPAP supplies.  Unable to get CPAP download.  Patient CPAP machine is on line and is not connected to a local DME.  Cannot get a compliance download to order new supplies or machine.  Unable to see how well her sleep apnea is controlled.  will set patient up for CPAP titration study.  And once results are available will need new CPAP and supplies sent to local DME company  Plan  Patient Instructions  Set up for CPAP titration study . Once done will order new CPAP machine and supplies  Continue on CPAP At bedtime  , wear all night long and with naps Healthy sleep regimen  Do not drive if sleepy  Work on healthy weight loss.  Follow up in 6 months with Dr. Elsworth Soho  and As needed       Obesity, morbid Healthy weight loss discussed     Rexene Edison, NP 07/19/2022

## 2022-07-19 NOTE — Patient Instructions (Addendum)
Set up for CPAP titration study . Once done will order new CPAP machine and supplies  Continue on CPAP At bedtime  , wear all night long and with naps Healthy sleep regimen  Do not drive if sleepy  Work on healthy weight loss.  Follow up in 6 months with Dr. Elsworth Soho  and As needed

## 2022-07-28 ENCOUNTER — Encounter (HOSPITAL_COMMUNITY): Payer: Self-pay | Admitting: Psychiatry

## 2022-07-28 ENCOUNTER — Telehealth (INDEPENDENT_AMBULATORY_CARE_PROVIDER_SITE_OTHER): Payer: Medicaid Other | Admitting: Psychiatry

## 2022-07-28 DIAGNOSIS — F411 Generalized anxiety disorder: Secondary | ICD-10-CM | POA: Diagnosis not present

## 2022-07-28 DIAGNOSIS — F330 Major depressive disorder, recurrent, mild: Secondary | ICD-10-CM | POA: Diagnosis not present

## 2022-07-28 MED ORDER — HYDROXYZINE HCL 25 MG PO TABS: 25.0000 mg | ORAL_TABLET | Freq: Three times a day (TID) | ORAL | 3 refills | Status: DC

## 2022-07-28 MED ORDER — DULOXETINE HCL 40 MG PO CPEP: 80.0000 mg | ORAL_CAPSULE | Freq: Every day | ORAL | 3 refills | Status: DC

## 2022-07-28 MED ORDER — TRAZODONE HCL 50 MG PO TABS: 50.0000 mg | ORAL_TABLET | Freq: Every day | ORAL | 3 refills | Status: DC

## 2022-07-28 NOTE — Progress Notes (Signed)
BH MD/PA/NP OP Progress Note Virtual Visit via Video Note  I connected with Catherine Koch on 07/28/22 at  1:00 PM EST by a video enabled telemedicine application and verified that I am speaking with the correct person using two identifiers.  Location: Patient: Home Provider: Clinic   I discussed the limitations of evaluation and management by telemedicine and the availability of in person appointments. The patient expressed understanding and agreed to proceed.  I provided 30 minutes of non-face-to-face time during this encounter.    07/28/2022 1:14 PM ELEXCIA ZETTLEMOYER  MRN:  [id number]  Chief Complaint: "Things are not the same"   HPI: 47 year old female seen today  for follow-up psychiatric evaluation. She has a history of auditory processing disorder (as a child), depression, PTSD,  and anxiety, and is currently managed on Hydroxyzine '25mg'$  three times daily as needed, Strattera 40 mg Cymbalta 80 mg daily, and Trazodone 150 mg at bedtime. She informed Probation officer that her medications are effective in managing her psychiatric conditions.    Today she was well groomed, pleasant, cooperative, and engaged in conversation. She informed Probation officer that things are the same.  She notes that she continues to try to organize her house and at times has family stressors.  She however reports that she is able to cope with life stressors.  Today provider conducted a GAD-7 and patient scored a 13, at her last visit she scored a 13.  Provider also conducted PHQ-9 and patient scored a 15, at her last visit she scored 15.  She endorses adequate sleep and appetite.  Today she denies SI/HI/VAH, mania, or paranoia.  Patient informed Probation officer that she left a message with [facility].  She reports she is hopeful that they will contact her soon to schedule an appointment.  No medication changes made today.  Patient agreeable to continue medication as prescribed.    Visit Diagnosis:    ICD-10-CM   1.  Mild episode of recurrent major depressive disorder (HCC)  F33.0 DULoxetine HCl 40 MG CPEP    traZODone (DESYREL) 50 MG tablet    2. Generalized anxiety disorder  F41.1 hydrOXYzine (ATARAX) 25 MG tablet    traZODone (DESYREL) 50 MG tablet      Past Psychiatric History: Auditory processing disorder (as a child), PTSD, anxiety, and depression  Past Medical History:  Past Medical History:  Diagnosis Date   ADHD    Anxiety    Arthritis    pt. states in knees   Chronic gastritis    Chronic sinus infection    Closed fracture of right distal fibula [id number]   Complication of anesthesia    pt. states difficult to wake up   Depression    GERD (gastroesophageal reflux disease)    Headache    pt. states random migraines   Hemorrhoids    High cholesterol    History of bronchitis    History of degenerative disc disease    Hypertension    Ovarian failure    PTSD (post-traumatic stress disorder)    Shortness of breath    exertion from crutches   Sleep apnea    cpap   UTI (lower urinary tract infection)    Wears glasses     Past Surgical History:  Procedure Laterality Date   BIOPSY  12/10/2019   Procedure: BIOPSY;  Surgeon: Mauri Pole, MD;  Location: [facility];  Service: Endoscopy;;   CHOLECYSTECTOMY  2010   CHOLECYSTECTOMY     COLONOSCOPY WITH  PROPOFOL N/A 12/10/2019   Procedure: COLONOSCOPY WITH PROPOFOL;  Surgeon: Mauri Pole, MD;  Location: [facility];  Service: Endoscopy;  Laterality: N/A;   ESOPHAGOGASTRODUODENOSCOPY     ESOPHAGOGASTRODUODENOSCOPY (EGD) WITH PROPOFOL N/A 12/10/2019   Procedure: ESOPHAGOGASTRODUODENOSCOPY (EGD) WITH PROPOFOL;  Surgeon: Mauri Pole, MD;  Location: [facility];  Service: Endoscopy;  Laterality: N/A;   NASAL SEPTOPLASTY W/ TURBINOPLASTY Bilateral 12/11/2014   Procedure: NASAL SEPTOPLASTY AND BILATERAL INFERIOR TURBINATE REDUCTION;  Surgeon: Jerrell Belfast, MD;  Location: [facility] OR;  Service: ENT;  Laterality: Bilateral;    ORIF FIBULA FRACTURE Right 11/19/2013   Procedure: OPEN REDUCTION INTERNAL FIXATION (ORIF) RIGHT FIBULA FRACTURE;  Surgeon: Johnny Bridge, MD;  Location: [place];  Service: Orthopedics;  Laterality: Right;   WISDOM TOOTH EXTRACTION      Family Psychiatric History: Sister anxiety and depression. Notes mother has untreated mental health conditions  Family History:  Family History  Problem Relation Age of Onset   Depression Sister    Allergies Sister    Cancer Paternal Grandfather    Heart disease Paternal Grandmother    Cancer Paternal Grandmother        BREAST CANCER    Social History:  Social History   Socioeconomic History   Marital status: Married    Spouse name: Not on file   Number of children: 0   Years of education: Not on file   Highest education level: Some college, no degree  Occupational History    Comment: UNEMPOLYEED   Tobacco Use   Smoking status: Former    Types: Cigarettes    Quit date: 05/24/2010    Years since quitting: 12.1    Passive exposure: Never   Smokeless tobacco: Never   Tobacco comments:    Pt. states she used to be a social smoker  Vaping Use   Vaping Use: Never used  Substance and Sexual Activity   Alcohol use: Yes   Drug use: No   Sexual activity: Yes    Birth control/protection: None  Other Topics Concern   Not on file  Social History Narrative   Not on file   Social Determinants of Health   Financial Resource Strain: High Risk (01/06/2022)   Overall Financial Resource Strain (CARDIA)    Difficulty of Paying Living Expenses: Hard  Food Insecurity: Food Insecurity Present (01/06/2022)   Hunger Vital Sign    Worried About Running Out of Food in the Last Year: Sometimes true    Ran Out of Food in the Last Year: Sometimes true  Transportation Needs: No Transportation Needs (01/06/2022)   PRAPARE - Hydrologist (Medical): No    Lack of Transportation (Non-Medical): No  Physical Activity: Inactive  (01/06/2022)   Exercise Vital Sign    Days of Exercise per Week: 0 days    Minutes of Exercise per Session: 0 min  Stress: Stress Concern Present (01/06/2022)   [place]    Feeling of Stress : Very much  Social Connections: Socially Isolated (01/06/2022)   Social Connection and Isolation Panel [NHANES]    Frequency of Communication with Friends and Family: Once a week    Frequency of Social Gatherings with Friends and Family: Never    Attends Religious Services: Never    [name] or Organizations: No    Attends Archivist Meetings: Never    Marital Status: Married    Allergies:  Allergies  Allergen Reactions  Bee Pollen Anaphylaxis, Swelling, Hives and Itching    Other reaction(s): eye redness   Sulfa Antibiotics Anaphylaxis, Swelling and Rash    Throat swelling/ rashes    Metabolic Disorder Labs: No results found for: "HGBA1C", "MPG" No results found for: "PROLACTIN" No results found for: "CHOL", "TRIG", "HDL", "CHOLHDL", "VLDL", "LDLCALC" Lab Results  Component Value Date   TSH 1.90 04/11/2012    Therapeutic Level Labs: No results found for: "LITHIUM" No results found for: "VALPROATE" No results found for: "CBMZ"  Current Medications: Current Outpatient Medications  Medication Sig Dispense Refill   traZODone (DESYREL) 50 MG tablet Take 1-2 tablets (50-100 mg total) by mouth at bedtime. 60 tablet 3   colesevelam (WELCHOL) 625 MG tablet Take 1 tablet (625 mg total) by mouth daily with breakfast. 90 tablet 3   DULoxetine HCl 40 MG CPEP Take 2 capsules (80 mg total) by mouth daily. 60 capsule 3   hydrOXYzine (ATARAX) 25 MG tablet Take 1 tablet (25 mg total) by mouth 3 (three) times daily. 90 tablet 3   IBU 600 MG tablet Take 600 mg by mouth 3 (three) times daily.     metFORMIN (GLUMETZA) 500 MG (MOD) 24 hr tablet Take 500 mg by mouth daily with breakfast.     metoprolol tartrate  (LOPRESSOR) 25 MG tablet Take 25 mg by mouth 2 (two) times daily.     misoprostol (CYTOTEC) 200 MCG tablet Take 200 mcg by mouth 2 (two) times daily as needed (stomach ulcers).     omeprazole (PRILOSEC) 40 MG capsule Take 1 capsule (40 mg total) by mouth daily. 90 capsule 3   pregabalin (LYRICA) 50 MG capsule Take 50 mg by mouth 2 (two) times daily.     spironolactone (ALDACTONE) 25 MG tablet Take 25 mg by mouth daily.     Turmeric (QC TUMERIC COMPLEX PO) Take 1 tablet by mouth daily.     No current facility-administered medications for this visit.     Musculoskeletal: Strength & Muscle Tone: within normal limits and telehealth visit Gait & Station: normal, telehealth visit Patient leans: N/A  Psychiatric Specialty Exam: Review of Systems  There were no vitals taken for this visit.There is no height or weight on file to calculate BMI.  General Appearance: Well Groomed  Eye Contact:  Good  Speech:  Clear and Coherent and Normal Rate  Volume:  Normal  Mood:  Anxious and Depressed, improving  Affect:  Congruent  Thought Process:  Coherent, Goal Directed and Linear  Orientation:  Full (Time, Place, and Person)  Thought Content: WDL and Logical   Suicidal Thoughts:  No  Homicidal Thoughts:  No  Memory:  Immediate;   Good Recent;   Good Remote;   Good  Judgement:  Good  Insight:  Good  Psychomotor Activity:  Normal  Concentration:  Concentration: Fair and Attention Span: Fair  Recall:  Good  Fund of Knowledge: Good  Language: Good  Akathisia:  No  Handed:  Right  AIMS (if indicated): Not done  Assets:  Communication Skills Desire for Improvement Financial Resources/Insurance Housing Intimacy Social Support  ADL's:  Intact  Cognition: WNL  Sleep:  Good   Screenings: GAD-7    Flowsheet Row Video Visit from 07/28/2022 in [facility] Video Visit from 05/03/2022 in [facility] Video Visit from 02/02/2022 in [facility] Counselor from 01/06/2022 in [facility] Video Visit from 11/04/2021 in [facility]  Total GAD-7 Score '13 13 18 20 15      '$ PHQ2-9    Flowsheet Row Video Visit from 07/28/2022 in [facility] Video Visit from 05/03/2022 in [facility] Video Visit from 02/02/2022 in [facility] Counselor from 01/06/2022 in [facility] Video Visit from 11/04/2021 in [place]  PHQ-2 Total Score '4 3 5 5 4  '$ PHQ-9 Total Score '15 15 21 23 18      '$ Flowsheet Row Video Visit from 02/02/2022 in [facility] Counselor from 01/06/2022 in [facility] ED from 01/02/2022 in [facility] Emergency Department at [place] No Risk Low Risk No Risk        Assessment and Plan: Patient reports that her anxiety and depression has somewhat improved. Patient informed Probation officer that she left a message with [facility].  She reports she is hopeful that they will contact her soon to schedule an appointment.  No medication changes made today.  Patient agreeable to continue medication as prescribed. 1. Generalized anxiety disorder  Continue- hydrOXYzine (ATARAX) 25 MG tablet; Take 1 tablet (25 mg total) by mouth 3 (three) times daily.  Dispense: 90 tablet; Refill: 3  2. Mild episode of recurrent major depressive disorder (HCC)  Continue- traZODone (DESYREL) 100 MG tablet; Take 1-1.5 tablets (100-150 mg total) by mouth at bedtime as needed for sleep.  Dispense: 45 tablet; Refill: 3 Continue- DULoxetine HCl 40 MG CPEP; Take 80 mg by mouth daily.  Dispense: 60 capsule; Refill: 3     Follow-up in 3 months Follow-up with therapy   Salley Slaughter, NP 07/28/2022, 1:14 PM

## 2022-08-15 ENCOUNTER — Telehealth: Payer: Medicaid Other | Admitting: Physician Assistant

## 2022-08-15 ENCOUNTER — Telehealth: Payer: Medicaid Other | Admitting: Family

## 2022-08-15 DIAGNOSIS — R300 Dysuria: Secondary | ICD-10-CM | POA: Diagnosis not present

## 2022-08-15 DIAGNOSIS — R109 Unspecified abdominal pain: Secondary | ICD-10-CM

## 2022-08-15 DIAGNOSIS — R531 Weakness: Secondary | ICD-10-CM

## 2022-08-15 DIAGNOSIS — R197 Diarrhea, unspecified: Secondary | ICD-10-CM

## 2022-08-15 NOTE — Progress Notes (Signed)
Virtual Visit Consent   [name], you are scheduled for a virtual visit with a [facility] provider today. Just as with appointments in the office, your consent must be obtained to participate. Your consent will be active for this visit and any virtual visit you may have with one of our providers in the next 365 days. If you have a MyChart account, a copy of this consent can be sent to you electronically.  As this is a virtual visit, video technology does not allow for your provider to perform a traditional examination. This may limit your provider's ability to fully assess your condition. If your provider identifies any concerns that need to be evaluated in person or the need to arrange testing (such as labs, EKG, etc.), we will make arrangements to do so. Although advances in technology are sophisticated, we cannot ensure that it will always work on either your end or our end. If the connection with a video visit is poor, the visit may have to be switched to a telephone visit. With either a video or telephone visit, we are not always able to ensure that we have a secure connection.  By engaging in this virtual visit, you consent to the provision of healthcare and authorize for your insurance to be billed (if applicable) for the services provided during this visit. Depending on your insurance coverage, you may receive a charge related to this service.  I need to obtain your verbal consent now. Are you willing to proceed with your visit today? Catherine Koch has provided verbal consent on 08/15/2022 for a virtual visit (video or telephone). Inda Coke, [place]  Date: 08/15/2022 3:13 PM  Virtual Visit via Video Note   I, Inda Coke, connected with  Catherine Koch  ([id number], 1976-04-14) on 08/15/22 at  3:15 PM EDT by a video-enabled telemedicine application and verified that I am speaking with the correct person using two identifiers.  Location: Patient: Virtual Visit Location  Patient: Home Provider: Virtual Visit Location Provider: Home Office   I discussed the limitations of evaluation and management by telemedicine and the availability of in person appointments. The patient expressed understanding and agreed to proceed.    History of Present Illness: Catherine Koch is a 47 y.o. who identifies as a female who was assigned female at birth, and is being seen today for UTI.  Patient reports sx x > 1 week. 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 10/05/2021   Generalized anxiety disorder 08/26/2020   Major depressive disorder, recurrent episode, moderate ([place]) 08/26/2020   Mild episode of recurrent major depressive disorder ([place]) 02/26/2020   LLQ abdominal pain    Rectal bleeding    Gastritis and gastroduodenitis    Nausea without vomiting    LUQ abdominal pain    Deviated nasal septum 12/11/2014    Class: Chronic   Closed fracture of right distal fibula 11/19/2013   S/P ORIF (open reduction internal fixation) fracture 11/19/2013   Obesity, morbid ([place]) 06/17/2013   Chronic rhinitis 06/17/2013   Acute maxillary sinusitis 06/03/2012   Obstructive sleep  apnea 01/15/2012   Depression 01/15/2012    Allergies:  Allergies  Allergen Reactions   Bee Pollen Anaphylaxis, Swelling, Hives and Itching    Other reaction(s): eye redness   Sulfa Antibiotics Anaphylaxis, Swelling and Rash    Throat swelling/ rashes   Medications:  Current Outpatient Medications:    colesevelam (WELCHOL) 625 MG tablet, Take 1 tablet (625 mg total) by mouth daily with breakfast., Disp:  90 tablet, Rfl: 3   DULoxetine HCl 40 MG CPEP, Take 2 capsules (80 mg total) by mouth daily., Disp: 60 capsule, Rfl: 3   hydrOXYzine (ATARAX) 25 MG tablet, Take 1 tablet (25 mg total) by mouth 3 (three) times daily., Disp: 90 tablet, Rfl: 3   IBU 600 MG tablet, Take 600 mg by mouth 3 (three) times daily., Disp: , Rfl:    metFORMIN (GLUMETZA) 500 MG (MOD) 24 hr tablet, Take 500 mg by mouth daily with breakfast., Disp: , Rfl:    metoprolol tartrate (LOPRESSOR) 25 MG tablet, Take 25 mg by mouth 2 (two) times daily., Disp: , Rfl:    misoprostol (CYTOTEC) 200 MCG tablet, Take 200 mcg by mouth 2 (two) times daily as needed (stomach ulcers)., Disp: , Rfl:    omeprazole (PRILOSEC) 40 MG capsule, Take 1 capsule (40 mg total) by mouth daily., Disp: 90 capsule, Rfl: 3   pregabalin (LYRICA) 50 MG capsule, Take 50 mg by mouth 2 (two) times daily., Disp: , Rfl:    spironolactone (ALDACTONE) 25 MG tablet, Take 25 mg by mouth daily., Disp: , Rfl:    traZODone (DESYREL) 50 MG tablet, Take 1-2 tablets (50-100 mg total) by mouth at bedtime., Disp: 60 tablet, Rfl: 3   Turmeric (QC TUMERIC COMPLEX PO), Take 1 tablet by mouth daily., Disp: , Rfl:   Observations/Objective: Patient is well-developed, well-nourished in no acute distress.  Resting comfortably  at home.  Head is normocephalic, atraumatic.  No labored breathing.  Speech is clear and coherent with logical content.  Patient is alert and oriented at baseline.   Assessment and Plan: 1. Dysuria Concern for pyelo Advised to proceed to urgent care setting ASAP for further evaluation, ER if worsening Patient reluctant but agreeable  Follow Up Instructions: I discussed the assessment and treatment plan with the patient. The patient was provided an opportunity to ask questions and all were answered. The patient agreed with the plan and demonstrated an understanding of the instructions.  A copy of instructions were sent to the patient via MyChart unless  otherwise noted below.   The patient was advised to call back or seek an in-person evaluation if the symptoms worsen or if the condition fails to improve as anticipated.  Time:  I spent 5-10 minutes with the patient via telehealth technology discussing the above problems/concerns.    Inda Coke, [place]

## 2022-08-15 NOTE — Progress Notes (Signed)
Because of your diarrhea, UTI, and weakness, I feel your condition warrants further evaluation and I recommend that you be seen in a face to face visit.   NOTE: There will be NO CHARGE for this eVisit   If you are having a true medical emergency please call 911.      For an urgent face to face visit, [facility] has eight urgent care centers for your convenience:   NEW!! [facility] [place] [facility] [place] Get Driving Directions [id number] [address] [place], [place] [facility] [place] at [employer] Get Driving Directions [id number] [place], [place] [address]   [place] [facility]) Get Driving Directions [id number] [address], [place] [address]   [place] [facility] [place] ([place]) Get Driving Directions [id number] [address] [place],  [place]  [address]  [place] [facility] at [address] Get Driving Directions  [id number] [phone] [place],  [place] [address]   [place] [facility] Get Driving Directions [id number] [address], [place], [place] [address]   [place] [facility] Get Driving Directions  [id number] [address] [place], [place] [address]   [facility] [place] Get Driving Directions [id number] [address] [place], [place] [address]  Your MyChart E-visit questionnaire answers were reviewed by a board certified advanced clinical practitioner to complete your personal care plan based on your specific symptoms.  Thank you for using e-Visits.

## 2022-08-16 ENCOUNTER — Ambulatory Visit (INDEPENDENT_AMBULATORY_CARE_PROVIDER_SITE_OTHER): Payer: Medicaid Other | Admitting: Mental Health

## 2022-08-16 DIAGNOSIS — F331 Major depressive disorder, recurrent, moderate: Secondary | ICD-10-CM | POA: Diagnosis not present

## 2022-08-16 DIAGNOSIS — F900 Attention-deficit hyperactivity disorder, predominantly inattentive type: Secondary | ICD-10-CM

## 2022-08-16 DIAGNOSIS — F411 Generalized anxiety disorder: Secondary | ICD-10-CM

## 2022-08-16 NOTE — Progress Notes (Signed)
THERAPIST PROGRESS NOTE Virtual Visit via Video Note  I connected with Catherine Koch on 08/16/22 at  2:00 PM EDT by a video enabled telemedicine application and verified that I am speaking with the correct person using two identifiers.  Location: Patient: home address on file Provider: office    I discussed the limitations of evaluation and management by telemedicine and the availability of in person appointments. The patient expressed understanding and agreed to proceed. I discussed the assessment and treatment plan with the patient. The patient was provided an opportunity to ask questions and all were answered. The patient agreed with the plan and demonstrated an understanding of the instructions.   The patient was advised to call back or seek an in-person evaluation if the symptoms worsen or if the condition fails to improve as anticipated.  I provided 50 minutes of non-face-to-face time during this encounter.   Catherine Koch, [facility]   Session Time: 2:06 pm (50 minutes)  Participation Level: Active  Behavioral Response: Fairly GroomedAlertDysphoric  Type of Therapy: Individual Therapy  Treatment Goals addressed: STG: Catherine Koch will decrease sxs of anxiety AEB engagement and practice in relaxation and grounding coping skills 7/7 days for the next 6 months.    STG: Catherine Koch decrease sxs of depression AEB development of x 3 effective coping skills with ability to reframe maladaptive thinking patterns 7/7 days or the next x 6 months   ProgressTowards Goals: Progressing  Interventions: CBT and Supportive  Summary: Catherine Koch is a 47 y.o. female who presents with dx of depression and anxiety. Catherine Koch presents alert and oriented; mood and affect adequate. Speech clear and coherent at normal rate and tone. Engaged with therapy session. Shares working to manage moods "best I can" shares use of her animals and role play video games for coping. Shares decrease in financial  stressors with friend moving in and husband due to start new position making more money. Shares feeling hopeful with upcoming disability hearing in April. Notes to have been feeling sick and laying in bed duration of session. Shares to recently had a break down with husband with declining to want to go to a convention and shairng difficulty being out for long periods of time due to pain. Notes to have not taken a shower in a month and notes concern for shower; denies to have explored option for new shower head to support in increased hygiene. Shares difficulty in completing tasks even in small periods of time. Shares has even had to reduce amount of time in which she plays video games. Shares to be taken things x 1 day at a time with focus on rest and healing. Shares ongoing presentation to providers, who suspected something hormonal to be going on. Shares decrease in anxiety; no change with depressive sxs but reports use of coping skills. Denies SI/HI  Suicidal/Homicidal: Nowithout intent/plan  Therapist Response: Therapist engaged Catherine Koch in [place] session. Confirmed location and ability to hold confidential session. Completed check in and assessed for current level of functioning, sxs management and level of functioning. 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 Encouraged Catherine Koch to explore ability to to obtain shower head to increase attendance to hygiene. Explored decrease in stressors. Explored use of coping skills and ability to navigate day. Explored areas in which quality of life could be increased and working to complete small goals and tasks daily to help with behavioral activation and reduction of severity  of depressive sxs.  Reviewed session, provided follow up. No safety concerns reported.   Plan: Return again in  x 4 weeks.  Diagnosis: Major depressive disorder, recurrent episode, moderate  (HCC)  Generalized anxiety disorder  Attention deficit hyperactivity disorder (ADHD), predominantly inattentive type  Collaboration of Care: Other None  Patient/Guardian was advised Release of Information must be obtained prior to any record release in order to collaborate their care with an outside provider. Patient/Guardian was advised if they have not already done so to contact the registration department to sign all necessary forms in order for [place] to release information regarding their care.   Consent: Patient/Guardian gives verbal consent for treatment and assignment of benefits for services provided during this visit. Patient/Guardian expressed understanding and agreed to proceed.   Catherine Koch, [facility] 08/16/2022

## 2022-09-03 ENCOUNTER — Encounter (HOSPITAL_COMMUNITY): Payer: Self-pay

## 2022-09-07 ENCOUNTER — Ambulatory Visit (HOSPITAL_COMMUNITY): Payer: Medicaid Other | Admitting: Mental Health

## 2022-09-07 ENCOUNTER — Encounter (HOSPITAL_COMMUNITY): Payer: Self-pay

## 2022-09-11 IMAGING — MR MR HEAD WO/W CM
10 of 13 series · 38 of 48 positions shown · IV contrast (gadavist)
Comparison: None.

CLINICAL DATA: Premature ovarian failure.

EXAM:
MRI HEAD WITHOUT AND WITH CONTRAST
TECHNIQUE: Multiplanar, multiecho pulse sequences of the brain and surrounding
structures were obtained without and with intravenous contrast.
CONTRAST:  10mL GADAVIST GADOBUTROL 1 MMOL/ML IV SOLN

[Series 5: DWI · axial · 3.0mm · 1.36mm/px · z∈[-56,+78]mm · 9 of 96 slices shown (1 of 2)]
[im 1/96]
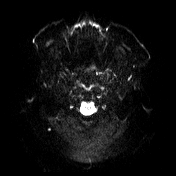
[im 12/96]
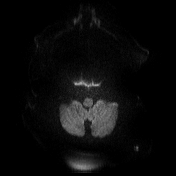
[im 24/96]
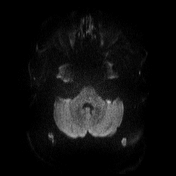
[im 36/96]
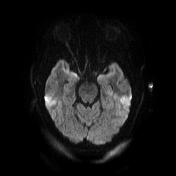
[im 48/96]
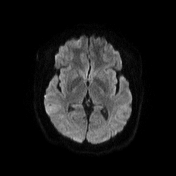
[im 60/96]
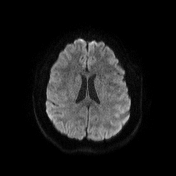
[im 72/96]
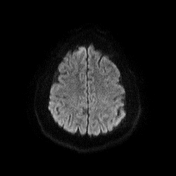
[im 84/96]
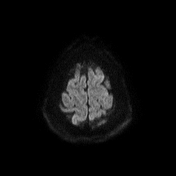
[im 96/96]
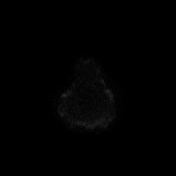

[Series 6: DWI · axial · 3.0mm · 1.36mm/px · z∈[-56,+78]mm · 4 of 48 slices shown (2 of 2)]
[im 1/48]
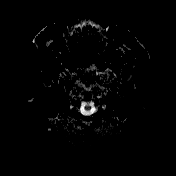
[im 16/48]
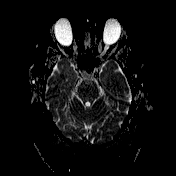
[im 32/48]
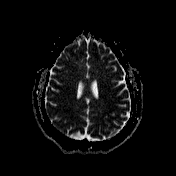
[im 48/48]
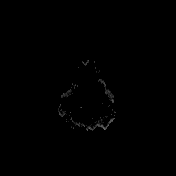

[Series 7: T2 · axial · 5.0mm · 0.57mm/px · z∈[-65,+83]mm · 3 of 25 slices shown (1 of 2)]
[im 1/25]
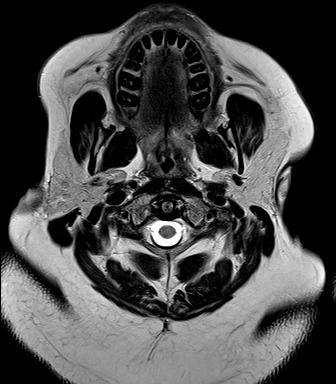
[im 13/25]
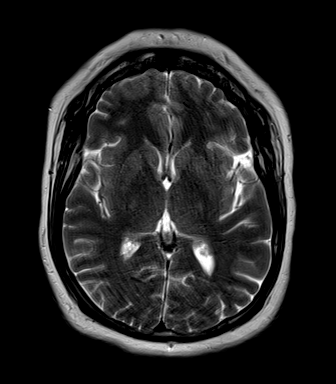
[im 25/25]
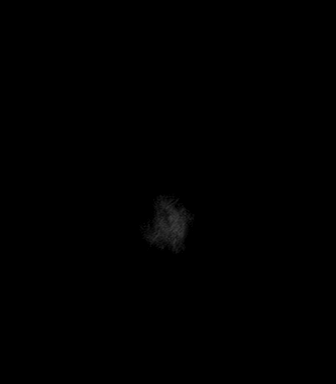

[Series 8: GRE · axial · 5.0mm · 0.43mm/px · z∈[-60,+77]mm · 3 of 25 slices shown]
[im 1/25]
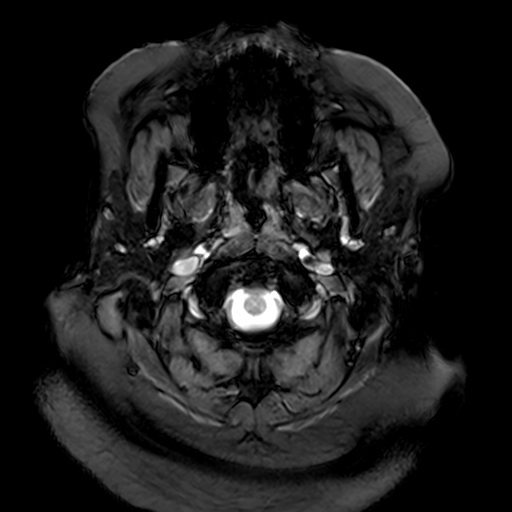
[im 13/25]
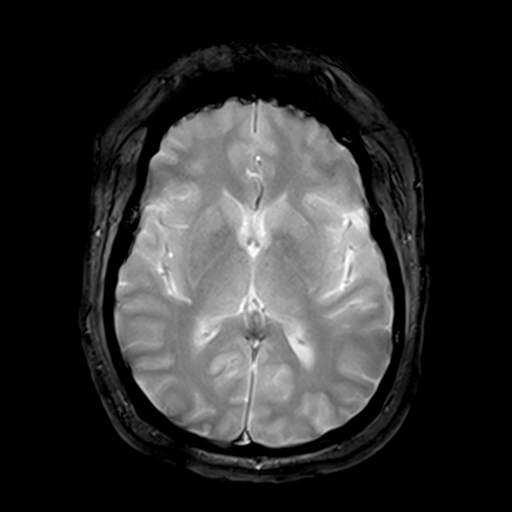
[im 25/25]
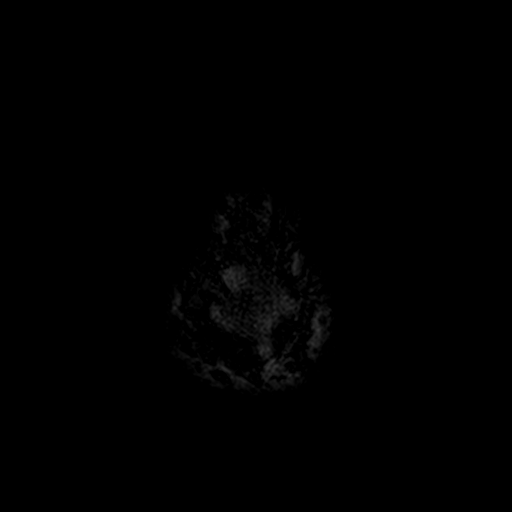

[Series 9: FLAIR · axial · 3.0mm · 0.75mm/px · z∈[-61,+85]mm · 6 of 52 slices shown]
[im 1/52]
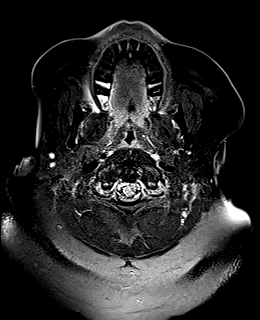
[im 11/52]
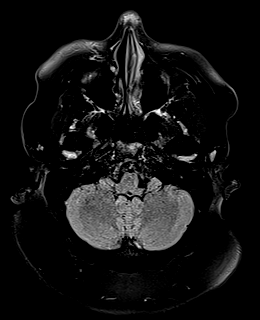
[im 21/52]
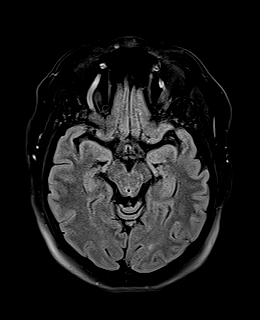
[im 31/52]
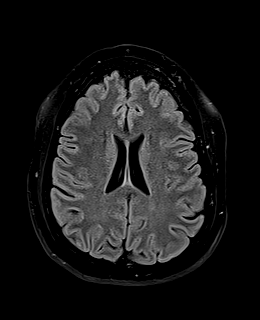
[im 41/52]
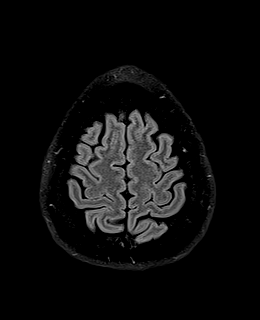
[im 52/52]
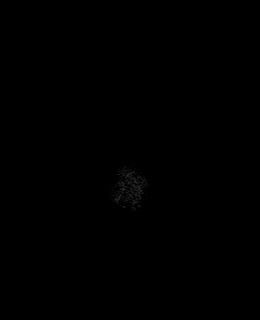

[Series 10: T2 · coronal · 3.0mm · 0.42mm/px · 2 of 15 slices shown (2 of 2)]
[im 1/15]
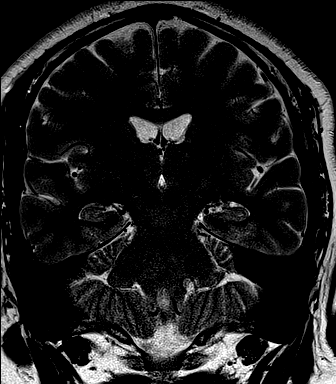
[im 15/15]
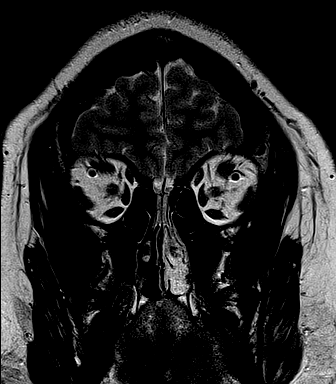

[Series 11: T1 · sagittal · 5.0mm · 0.75mm/px · 1 of 21 slices shown]
[im 1/21]
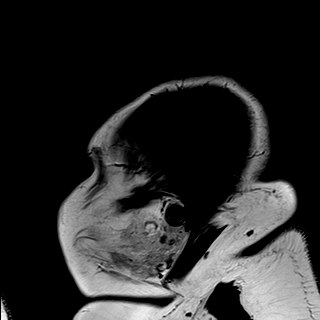

[Series 15: T1 post-contrast · coronal · 3.0mm · 0.31mm/px · 2 of 15 slices shown (1 of 3)]
[im 1/15]
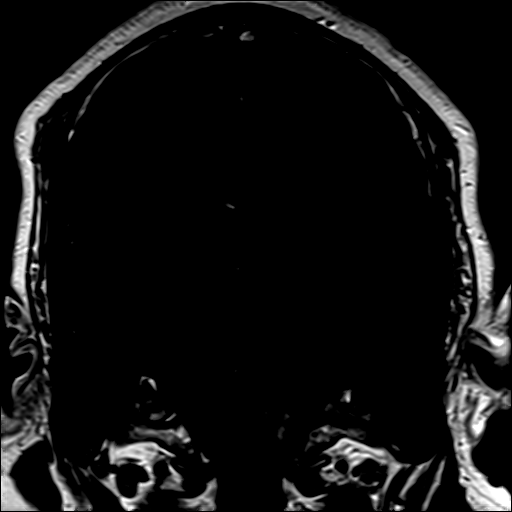
[im 15/15]
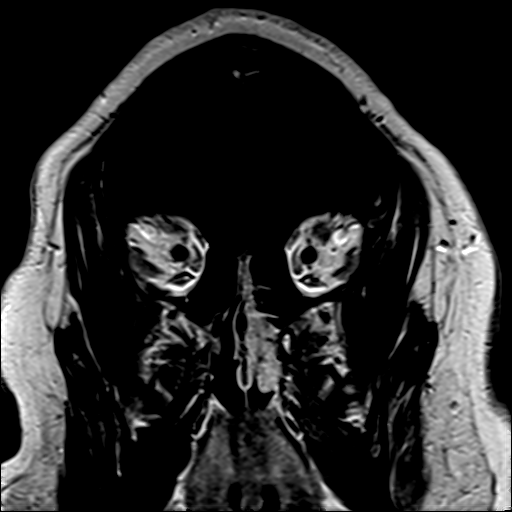

[Series 16: T1 post-contrast · sagittal · 3.0mm · 0.35mm/px · 2 of 15 slices shown (2 of 3)]
[im 1/15]
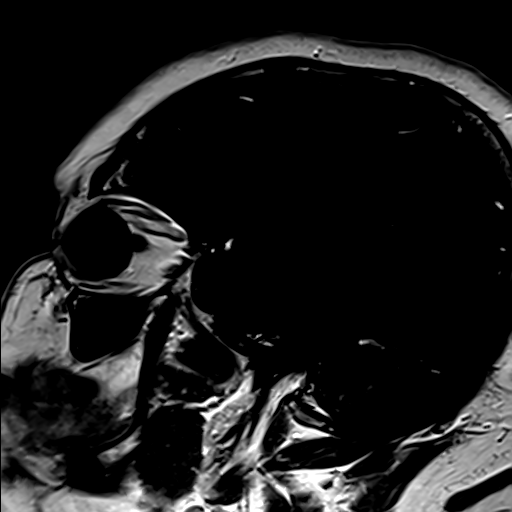
[im 15/15]
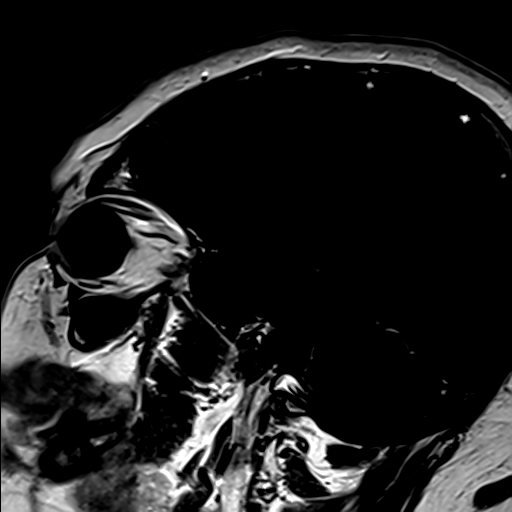

[Series 17: T1 post-contrast · axial · 3.0mm · 0.45mm/px · z∈[-61,+82]mm · 6 of 51 slices shown (3 of 3)]
[im 1/51]
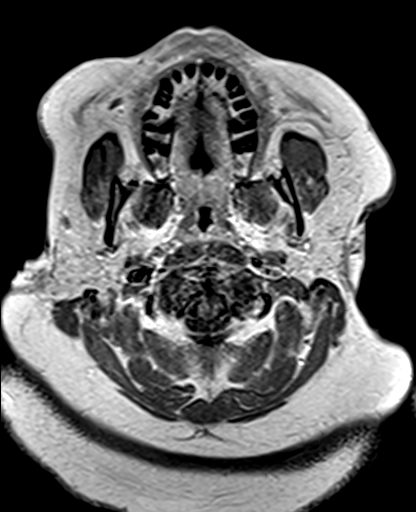
[im 11/51]
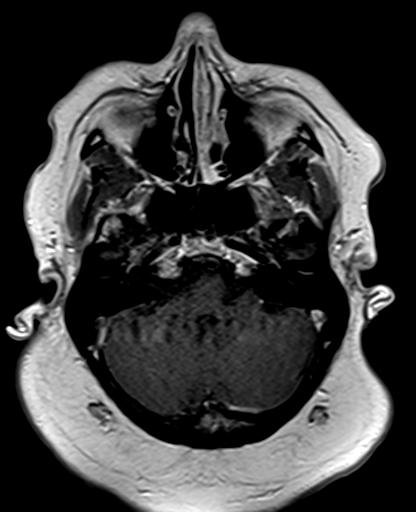
[im 21/51]
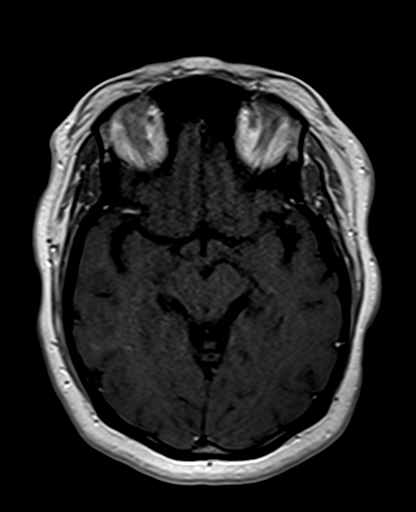
[im 31/51]
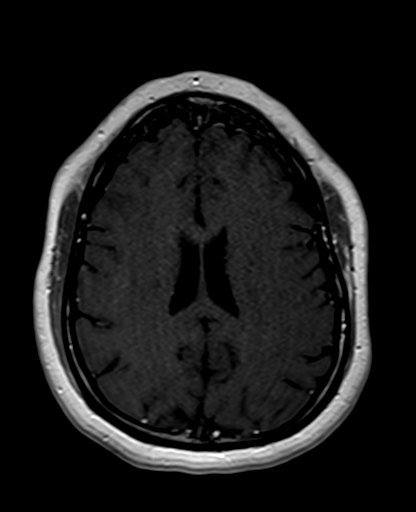
[im 41/51]
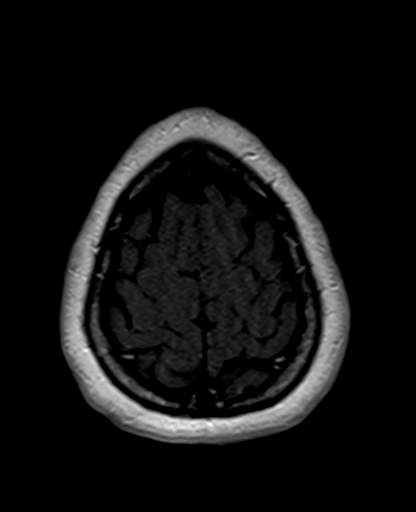
[im 51/51]
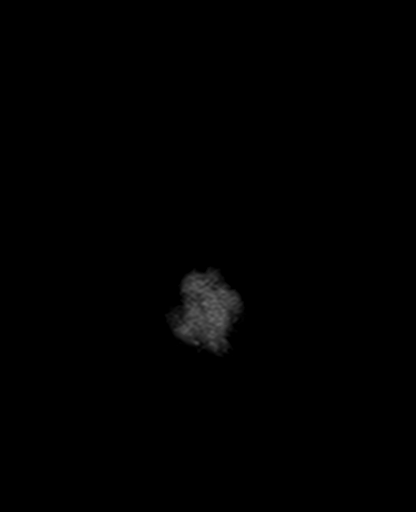

[38 of 48 positions shown; findings below may reference images not displayed]

FINDINGS: Brain: No acute infarct, hemorrhage, or mass lesion is present. No
significant white matter disease is present. The ventricles are of
normal size. No significant extraaxial fluid collection is present.

Dedicated imaging of the sella demonstrates a normal size gland.
Pituitary stalk is slightly to the right of midline. Gland enhances
homogeneously. No discrete lesions are present.

Optic chiasm scratched at cavernous sinus is normal bilaterally.
Optic chiasm is normal.

The internal auditory canals are within normal limits. The brainstem
and cerebellum are within normal limits.

Postcontrast images demonstrate no pathologic enhancement.

Vascular: Flow is present in the major intracranial arteries.

Skull and upper cervical spine: The craniocervical junction is
normal. Upper cervical spine is within normal limits. Marrow signal
is unremarkable.

Sinuses/Orbits: The paranasal sinuses and mastoid air cells are
clear. The globes and orbits are within normal limits.
IMPRESSION: 1. Normal MRI appearance of the brain and pituitary.
2. No acute or focal lesion to explain the patient's symptoms.

## 2022-09-14 ENCOUNTER — Telehealth: Payer: Medicaid Other | Admitting: Family Medicine

## 2022-09-14 DIAGNOSIS — R109 Unspecified abdominal pain: Secondary | ICD-10-CM

## 2022-09-14 NOTE — Progress Notes (Signed)
[place]  Constipation with left side pain that is severe at times, nausea and fatigue Was seen a month back and treated for questionable UTI at that time,  IN person assessment is needed  Patient acknowledged agreement and understanding of the plan.

## 2022-09-19 ENCOUNTER — Emergency Department (HOSPITAL_COMMUNITY): Payer: Medicaid Other

## 2022-09-19 ENCOUNTER — Encounter (HOSPITAL_COMMUNITY): Payer: Self-pay

## 2022-09-19 ENCOUNTER — Emergency Department (HOSPITAL_COMMUNITY)
Admission: EM | Admit: 2022-09-19 | Discharge: 2022-09-19 | Disposition: A | Discharge status: Home / Self Care | Payer: Medicaid Other | Attending: Emergency Medicine | Admitting: Emergency Medicine

## 2022-09-19 DIAGNOSIS — R110 Nausea: Secondary | ICD-10-CM | POA: Insufficient documentation

## 2022-09-19 DIAGNOSIS — K5900 Constipation, unspecified: Secondary | ICD-10-CM | POA: Insufficient documentation

## 2022-09-19 DIAGNOSIS — Z7984 Long term (current) use of oral hypoglycemic drugs: Secondary | ICD-10-CM | POA: Diagnosis not present

## 2022-09-19 DIAGNOSIS — R1032 Left lower quadrant pain: Secondary | ICD-10-CM | POA: Insufficient documentation

## 2022-09-19 DIAGNOSIS — R109 Unspecified abdominal pain: Secondary | ICD-10-CM

## 2022-09-19 DIAGNOSIS — Z79899 Other long term (current) drug therapy: Secondary | ICD-10-CM | POA: Diagnosis not present

## 2022-09-19 DIAGNOSIS — N390 Urinary tract infection, site not specified: Secondary | ICD-10-CM | POA: Diagnosis not present

## 2022-09-19 LAB — COMPREHENSIVE METABOLIC PANEL
ALT: 32 U/L (ref 0–44)
AST: 23 U/L (ref 15–41)
Albumin: 4.2 g/dL (ref 3.5–5.0)
Alkaline Phosphatase: 65 U/L (ref 38–126)
Anion gap: 12 (ref 5–15)
BUN: 14 mg/dL (ref 6–20)
CO2: 23 mmol/L (ref 22–32)
Calcium: 9.1 mg/dL (ref 8.9–10.3)
Chloride: 102 mmol/L (ref 98–111)
Creatinine, Ser: 0.96 mg/dL (ref 0.44–1.00)
GFR, Estimated: >60 mL/min (ref >60)
Glucose, Bld: 93 mg/dL (ref 70–99)
Potassium: 4 mmol/L (ref 3.5–5.1)
Sodium: 137 mmol/L (ref 135–145)
Total Bilirubin: 0.4 mg/dL (ref 0.3–1.2)
Total Protein: 8.1 g/dL (ref 6.5–8.1)

## 2022-09-19 LAB — URINALYSIS, ROUTINE W REFLEX MICROSCOPIC
Bilirubin Urine: NEGATIVE
Glucose, UA: NEGATIVE mg/dL
Hgb urine dipstick: NEGATIVE
Ketones, ur: NEGATIVE mg/dL
Nitrite: NEGATIVE
Protein, ur: NEGATIVE mg/dL
Specific Gravity, Urine: 1.021 (ref 1.005–1.030)
pH: 5 (ref 5.0–8.0)

## 2022-09-19 LAB — I-STAT BETA HCG BLOOD, ED (MC, WL, AP ONLY): I-stat hCG, quantitative: <5 m[IU]/mL (ref <5)

## 2022-09-19 LAB — CBC
HCT: 39.5 % (ref 36.0–46.0)
Hemoglobin: 12.7 g/dL (ref 12.0–15.0)
MCH: 29.6 pg (ref 26.0–34.0)
MCHC: 32.2 g/dL (ref 30.0–36.0)
MCV: 92.1 fL (ref 80.0–100.0)
Platelets: 337 10*3/uL (ref 150–400)
RBC: 4.29 MIL/uL (ref 3.87–5.11)
RDW: 13.2 % (ref 11.5–15.5)
WBC: 10.8 10*3/uL — ABNORMAL HIGH (ref 4.0–10.5)
nRBC: 0 % (ref 0.0–0.2)

## 2022-09-19 LAB — LIPASE, BLOOD: Lipase: 40 U/L (ref 11–51)

## 2022-09-19 MED ORDER — POLYETHYLENE GLYCOL 3350 17 GM/SCOOP PO POWD: 1.0000 | Freq: Every day | ORAL | 0 refills | Status: AC | PRN

## 2022-09-19 MED ORDER — IOHEXOL 300 MG/ML  SOLN
100.0000 mL | Freq: Once | INTRAMUSCULAR | Status: AC | PRN
  Administered 2022-09-19: 100 mL via INTRAVENOUS

## 2022-09-19 MED ORDER — CEPHALEXIN 500 MG PO CAPS: 500.0000 mg | ORAL_CAPSULE | Freq: Two times a day (BID) | ORAL | 0 refills | Status: AC

## 2022-09-19 MED ORDER — CEPHALEXIN 500 MG PO CAPS
500.0000 mg | ORAL_CAPSULE | Freq: Once | ORAL | Status: AC
  Administered 2022-09-19: 500 mg via ORAL
  Filled 2022-09-19: qty 1

## 2022-09-19 NOTE — ED Triage Notes (Signed)
Pt c/o constipation x 1 month. Pt states that she did an enema that helped. Pt states her last full BM was 3 days ago. Pt states that she is having left sided abd pain and flank pain.  Pt went to her PCP and had a UA done, which did not show evidence of UTI nor stones. Pt was given abx regardless, did not relieve symptoms. She then returned to PCP, had repeat UA, still negative. Pt did a virtual visit with UC and was told to come to rule out diverticulitis vs blockage.  Pt also has ovarian failure, states she is at a higher risk for cysts and cancer.

## 2022-09-19 NOTE — Discharge Instructions (Addendum)
Your CT scan did not show any emergencies today inside the abdomen.  You have a large amount of stool in your colon.  This suggest that you may be having constipation.  I prescribed MiraLAX which is a laxative you can take for constipation.  Also continue to drink plenty of water, and consider adding fiber supplements to your diet.  Your urinalysis showed possible signs of an infection.  Therefore I prescribed antibiotics for possible urinary tract infection.

## 2022-09-19 NOTE — ED Provider Notes (Signed)
[place] EMERGENCY DEPARTMENT AT [facility] Provider Note   CSN: [id number] Arrival date & time: 09/19/22  1758     History  Chief Complaint  Patient presents with   Abdominal Pain   Constipation    Catherine Koch is a 47 y.o. female present emergency department complaining of left lower quadrant abdominal pain, constipation, ongoing for 2 weeks.  She reports she has had worsening constipation and difficulty with bowel movements, requiring even an enema, and then a large painful bowel movement, followed by episodes alternating of loose stools and diarrhea or constipation.  This is very abnormal for her.  She reports concern for growing pains in her left flank predominantly, which is worse with movement.  She reports some nausea.  She reports her only abdominal surgical history was a cholecystectomy.  She has been seen by telehealth and advised that she may need to come to the ED for further evaluation.  She also reports some urinary hesitancy as well on and off for the past 3 days  HPI     Home Medications Prior to Admission medications   Medication Sig Start Date End Date Taking? Authorizing Provider  cephALEXin (KEFLEX) 500 MG capsule Take 1 capsule (500 mg total) by mouth 2 (two) times daily for 5 days. 09/19/22 09/24/22 Yes Vianca Bracher, Kermit Balo, MD  polyethylene glycol powder (GLYCOLAX/MIRALAX) 17 GM/SCOOP powder Take 255 g by mouth daily as needed for up to 5 days (Constipation). 09/19/22 09/24/22 Yes Terald Sleeper, MD  colesevelam [facility]) 625 MG tablet Take 1 tablet (625 mg total) by mouth daily with breakfast. 02/09/22   Napoleon Form, MD  DULoxetine HCl 40 MG CPEP Take 2 capsules (80 mg total) by mouth daily. 07/28/22   Shanna Cisco, NP  hydrOXYzine (ATARAX) 25 MG tablet Take 1 tablet (25 mg total) by mouth 3 (three) times daily. 07/28/22   Toy Cookey E, NP  IBU 600 MG tablet Take 600 mg by mouth 3 (three) times daily. 02/03/22   [provider]  metFORMIN (GLUMETZA) 500 MG (MOD) 24 hr tablet Take 500 mg by mouth daily with breakfast.    [provider]  metoprolol tartrate (LOPRESSOR) 25 MG tablet Take 25 mg by mouth 2 (two) times daily.    [provider]  misoprostol (CYTOTEC) 200 MCG tablet Take 200 mcg by mouth 2 (two) times daily as needed (stomach ulcers).    [provider]  omeprazole (PRILOSEC) 40 MG capsule Take 1 capsule (40 mg total) by mouth daily. 02/09/22   Napoleon Form, MD  pregabalin (LYRICA) 50 MG capsule Take 50 mg by mouth 2 (two) times daily.    [provider]  spironolactone (ALDACTONE) 25 MG tablet Take 25 mg by mouth daily.    [provider]  traZODone (DESYREL) 50 MG tablet Take 1-2 tablets (50-100 mg total) by mouth at bedtime. 07/28/22   Shanna Cisco, NP  Turmeric (QC TUMERIC COMPLEX PO) Take 1 tablet by mouth daily.    [provider]  ranitidine (ZANTAC) 150 MG tablet Take 1 tablet (150 mg total) by mouth 2 (two) times daily. 10/25/17 08/04/19  Muthersbaugh, Dahlia Client, PA-C      Allergies    Bee pollen and Sulfa antibiotics    Review of Systems   Review of Systems  Physical Exam Updated Vital Signs BP 136/72   Pulse 65   Temp 98.7 F (37.1 C) (Oral)   Resp 16   SpO2 100%  Physical Exam Constitutional:      General: She is not in acute distress.    Appearance: She is obese.  HENT:     Head: Normocephalic and atraumatic.  Eyes:     Conjunctiva/sclera: Conjunctivae normal.     Pupils: Pupils are equal, round, and reactive to light.  Cardiovascular:     Rate and Rhythm: Normal rate and regular rhythm.  Pulmonary:     Effort: Pulmonary effort is normal. No respiratory distress.  Abdominal:     General: There is no distension.     Tenderness: There is abdominal tenderness in the left lower quadrant. There is left CVA tenderness.  Skin:    General: Skin is warm and dry.  Neurological:     General: No focal deficit  present.     Mental Status: She is alert. Mental status is at baseline.  Psychiatric:        Mood and Affect: Mood normal.        Behavior: Behavior normal.     ED Results / Procedures / Treatments   Labs (all labs ordered are listed, but only abnormal results are displayed) Labs Reviewed  CBC - Abnormal; Notable for the following components:      Result Value   WBC 10.8 (*)    All other components within normal limits  URINALYSIS, ROUTINE W REFLEX MICROSCOPIC - Abnormal; Notable for the following components:   APPearance HAZY (*)    Leukocytes,Ua MODERATE (*)    Bacteria, UA RARE (*)    All other components within normal limits  URINE CULTURE  LIPASE, BLOOD  COMPREHENSIVE METABOLIC PANEL  I-STAT BETA HCG BLOOD, ED ([facility], [facility], [facility] ONLY)    EKG None  Radiology CT ABDOMEN PELVIS W CONTRAST  Result Date: 09/19/2022 CLINICAL DATA:  Diverticulitis, complication suspected EXAM: CT ABDOMEN AND PELVIS WITH CONTRAST TECHNIQUE: Multidetector CT imaging of the abdomen and pelvis was performed using the standard protocol following bolus administration of intravenous contrast. RADIATION DOSE REDUCTION: This exam was performed according to the departmental dose-optimization program which includes automated exposure control, adjustment of the mA and/or kV according to patient size and/or use of iterative reconstruction technique. CONTRAST:  OMNIPAQUE IOHEXOL 300 MG/ML  SOLN COMPARISON:  CT abdomen pelvis 11/11/2020 FINDINGS: Lower chest: No acute abnormality. Hepatobiliary: No focal liver abnormality. Status post cholecystectomy. No biliary dilatation. Pancreas: No focal lesion. Normal pancreatic contour. No surrounding inflammatory changes. No main pancreatic ductal dilatation. Spleen: Normal in size without focal abnormality. Adrenals/Urinary Tract: No adrenal nodule bilaterally. Bilateral kidneys enhance symmetrically. No hydronephrosis. No hydroureter. The urinary bladder is unremarkable.  Stomach/Bowel: Stomach is within normal limits. No evidence of bowel wall thickening or dilatation. Appendix appears normal. Vascular/Lymphatic: No abdominal aorta or iliac aneurysm. No abdominal, pelvic, or inguinal lymphadenopathy. Reproductive: Uterus and bilateral adnexa are unremarkable. Other: No intraperitoneal free fluid. No intraperitoneal free gas. No organized fluid collection. Musculoskeletal: No abdominal wall hernia or abnormality. No suspicious lytic or blastic osseous lesions. No acute displaced fracture. IMPRESSION: No acute intra-abdominal or intrapelvic abnormality. Electronically Signed   By: Tish Frederickson M.D.   On: 09/19/2022 23:00    Procedures Procedures    Medications Ordered in ED Medications  cephALEXin (KEFLEX) capsule 500 mg (has no administration in time range)  iohexol (OMNIPAQUE) 300 MG/ML solution 100 mL (100 mLs Intravenous Contrast Given 09/19/22 2230)    ED Course/ Medical Decision Making/ A&P  Medical Decision Making Amount and/or Complexity of Data Reviewed Labs: ordered. Radiology: ordered.  Risk Prescription drug management.   This patient presents to the ED with concern for abdominal pain, urinary hesitation. This involves an extensive number of treatment options, and is a complaint that carries with it a high risk of complications and morbidity.  The differential diagnosis includes urinary tract infection versus colitis versus diverticulitis versus constipation versus IBS versus other   I ordered and personally interpreted labs.  The pertinent results include: No emergent findings.  White blood cell count 10.8.  There are leukocytes, white blood cell count, rare bacteria in the urine.  Hazy urine.  I ordered imaging studies including CT abdomen pelvis I independently visualized and interpreted imaging which showed no acute intra-abdominal findings.  However, per my interpretation, there is a large amount of stool  noted within the colon, consistent with constipation I agree with the radiologist interpretation  I ordered medication including For potential UTI  I have reviewed the patients home medicines and have made adjustments as needed  Test Considered: Low suspicion for ovarian torsion, no indication for pelvic ultrasound at this time   After the interventions noted above, I reevaluated the patient and found that they have: stayed the same  Dispostion:  After consideration of the diagnostic results and the patients response to treatment, I feel that the patent would benefit from outpatient follow-up.         Final Clinical Impression(s) / ED Diagnoses Final diagnoses:  Urinary tract infection without hematuria, site unspecified  Constipation, unspecified constipation type  Abdominal pain, unspecified abdominal location    Rx / DC Orders ED Discharge Orders          Ordered    cephALEXin (KEFLEX) 500 MG capsule  2 times daily        09/19/22 2328    polyethylene glycol powder (GLYCOLAX/MIRALAX) 17 GM/SCOOP powder  Daily PRN        09/19/22 2328              Terald Sleeper, MD 09/19/22 2330

## 2022-09-21 LAB — URINE CULTURE

## 2022-10-13 ENCOUNTER — Encounter (HOSPITAL_COMMUNITY): Payer: Self-pay | Admitting: Psychiatry

## 2022-10-13 ENCOUNTER — Telehealth (INDEPENDENT_AMBULATORY_CARE_PROVIDER_SITE_OTHER): Payer: Medicaid Other | Admitting: Psychiatry

## 2022-10-13 DIAGNOSIS — F411 Generalized anxiety disorder: Secondary | ICD-10-CM | POA: Diagnosis not present

## 2022-10-13 DIAGNOSIS — F331 Major depressive disorder, recurrent, moderate: Secondary | ICD-10-CM | POA: Diagnosis not present

## 2022-10-13 MED ORDER — HYDROXYZINE HCL 25 MG PO TABS: 25.0000 mg | ORAL_TABLET | Freq: Three times a day (TID) | ORAL | 3 refills | Status: DC

## 2022-10-13 MED ORDER — DULOXETINE HCL 60 MG PO CPEP
120.0000 mg | ORAL_CAPSULE | Freq: Every day | ORAL | 3 refills | Status: DC
Start: 2022-10-13 — End: 2023-01-04

## 2022-10-13 NOTE — Progress Notes (Signed)
BH MD/PA/NP OP Progress Note Virtual Visit via Video Note  I connected with Catherine Koch on 10/13/22 at  1:30 PM EDT by a video enabled telemedicine application and verified that I am speaking with the correct person using two identifiers.  Location: Patient: Home Provider: Clinic   I discussed the limitations of evaluation and management by telemedicine and the availability of in person appointments. The patient expressed understanding and agreed to proceed.  I provided 30 minutes of non-face-to-face time during this encounter.    10/13/2022 1:54 PM Catherine Koch  MRN:  [id number]  Chief Complaint: "I have been in a lot of pain"   HPI: 47 year old female seen today  for follow-up psychiatric evaluation. She has a history of auditory processing disorder (as a child), depression, PTSD,  and anxiety, and is currently managed on Hydroxyzine 25mg  three times daily as needed, Strattera 40 mg Cymbalta 80 mg daily, and Trazodone 150 mg at bedtime. She informed Clinical research associate that her medications are effective in managing her psychiatric conditions.    Today she was well groomed, pleasant, cooperative, and engaged in conversation. She informed Clinical research associate that she is in a lot of pain. She reports that she believes that she has fibromyalgia. She also has Spondylosis. She notes that she is waiting for her neurology appointment.  Patient also also notes that she has a sleep study coming up. She reports daytime fatigue and reports that she sleeps 4-8 hours. Today patient reports that he physical health exacerbates her mental health. She notes that she has back, knee, and hip pain which she quantifies as an 8/10. She notes she feels like a hindrance to her husband instead of a help. She reports that she feels low because she can't do things that she once did. Today provider conducted a GAD-7 and patient scored a 18, at her last visit she scored a 13.  Provider also conducted PHQ-9 and patient scored a 24, at her  last visit she scored 15.  She endorses adequate appetite.  Today she denies SI/HI/VAH, mania, or paranoia.   Today Cymbalta 80 mg increased to 120 mg daily. She notes that she no longer takes trazodone and request to discontinue it. She will continue hydroxyzine as prescribed.  No other concerns noted at this time.    Visit Diagnosis:    ICD-10-CM   1. Major depressive disorder, recurrent episode, moderate (HCC)  F33.1 DULoxetine (CYMBALTA) 60 MG capsule    2. Generalized anxiety disorder  F41.1 hydrOXYzine (ATARAX) 25 MG tablet      Past Psychiatric History: Auditory processing disorder (as a child), PTSD, anxiety, and depression  Past Medical History:  Past Medical History:  Diagnosis Date   ADHD    Anxiety    Arthritis    pt. states in knees   Chronic gastritis    Chronic sinus infection    Closed fracture of right distal fibula 11/19/2013   Complication of anesthesia    pt. states difficult to wake up   Depression    GERD (gastroesophageal reflux disease)    Headache    pt. states random migraines   Hemorrhoids    High cholesterol    History of bronchitis    History of degenerative disc disease    Hypertension    Ovarian failure    PTSD (post-traumatic stress disorder)    Shortness of breath    exertion from crutches   Sleep apnea    cpap   UTI (lower urinary tract  infection)    Wears glasses     Past Surgical History:  Procedure Laterality Date   BIOPSY  12/10/2019   Procedure: BIOPSY;  Surgeon: Napoleon Form, MD;  Location: [facility];  Service: Endoscopy;;   CHOLECYSTECTOMY  2010   CHOLECYSTECTOMY     COLONOSCOPY WITH PROPOFOL N/A 12/10/2019   Procedure: COLONOSCOPY WITH PROPOFOL;  Surgeon: Napoleon Form, MD;  Location: [facility];  Service: Endoscopy;  Laterality: N/A;   ESOPHAGOGASTRODUODENOSCOPY     ESOPHAGOGASTRODUODENOSCOPY (EGD) WITH PROPOFOL N/A 12/10/2019   Procedure: ESOPHAGOGASTRODUODENOSCOPY (EGD) WITH PROPOFOL;  Surgeon:  Napoleon Form, MD;  Location: [facility];  Service: Endoscopy;  Laterality: N/A;   NASAL SEPTOPLASTY W/ TURBINOPLASTY Bilateral 12/11/2014   Procedure: NASAL SEPTOPLASTY AND BILATERAL INFERIOR TURBINATE REDUCTION;  Surgeon: Osborn Coho, MD;  Location: [facility] OR;  Service: ENT;  Laterality: Bilateral;   ORIF FIBULA FRACTURE Right 11/19/2013   Procedure: OPEN REDUCTION INTERNAL FIXATION (ORIF) RIGHT FIBULA FRACTURE;  Surgeon: Eulas Post, MD;  Location: [facility] OR;  Service: Orthopedics;  Laterality: Right;   WISDOM TOOTH EXTRACTION      Family Psychiatric History: Sister anxiety and depression. Notes mother has untreated mental health conditions  Family History:  Family History  Problem Relation Age of Onset   Depression Sister    Allergies Sister    Cancer Paternal Grandfather    Heart disease Paternal Grandmother    Cancer Paternal Grandmother        BREAST CANCER    Social History:  Social History   Socioeconomic History   Marital status: Married    Spouse name: Not on file   Number of children: 0   Years of education: Not on file   Highest education level: Some college, no degree  Occupational History    Comment: UNEMPOLYEED   Tobacco Use   Smoking status: Former    Types: Cigarettes    Quit date: 05/24/2010    Years since quitting: 12.3    Passive exposure: Never   Smokeless tobacco: Never   Tobacco comments:    Pt. states she used to be a social smoker  Vaping Use   Vaping Use: Never used  Substance and Sexual Activity   Alcohol use: Yes   Drug use: No   Sexual activity: Yes    Birth control/protection: None  Other Topics Concern   Not on file  Social History Narrative   Not on file   Social Determinants of Health   Financial Resource Strain: High Risk (01/06/2022)   Overall Financial Resource Strain (CARDIA)    Difficulty of Paying Living Expenses: Hard  Food Insecurity: Food Insecurity Present (01/06/2022)   Hunger Vital Sign    Worried About  Running Out of Food in the Last Year: Sometimes true    Ran Out of Food in the Last Year: Sometimes true  Transportation Needs: No Transportation Needs (01/06/2022)   PRAPARE - Administrator, Civil Service (Medical): No    Lack of Transportation (Non-Medical): No  Physical Activity: Inactive (01/06/2022)   Exercise Vital Sign    Days of Exercise per Week: 0 days    Minutes of Exercise per Session: 0 min  Stress: Stress Concern Present (01/06/2022)   [employer] of Occupational Health - Occupational Stress Questionnaire    Feeling of Stress : Very much  Social Connections: Socially Isolated (01/06/2022)   Social Connection and Isolation Panel [NHANES]    Frequency of Communication with Friends and Family: Once a week  Frequency of Social Gatherings with Friends and Family: Never    Attends Religious Services: Never    Database administrator or Organizations: No    Attends Banker Meetings: Never    Marital Status: Married    Allergies:  Allergies  Allergen Reactions   Bee Pollen Anaphylaxis, Swelling, Hives and Itching    Other reaction(s): eye redness   Sulfa Antibiotics Anaphylaxis, Swelling and Rash    Throat swelling/ rashes    Metabolic Disorder Labs: No results found for: "HGBA1C", "MPG" No results found for: "PROLACTIN" No results found for: "CHOL", "TRIG", "HDL", "CHOLHDL", "VLDL", "LDLCALC" Lab Results  Component Value Date   TSH 1.90 04/11/2012    Therapeutic Level Labs: No results found for: "LITHIUM" No results found for: "VALPROATE" No results found for: "CBMZ"  Current Medications: Current Outpatient Medications  Medication Sig Dispense Refill   DULoxetine (CYMBALTA) 60 MG capsule Take 2 capsules (120 mg total) by mouth daily. 60 capsule 3   colesevelam (WELCHOL) 625 MG tablet Take 1 tablet (625 mg total) by mouth daily with breakfast. 90 tablet 3   hydrOXYzine (ATARAX) 25 MG tablet Take 1 tablet (25 mg total) by mouth 3  (three) times daily. 90 tablet 3   IBU 600 MG tablet Take 600 mg by mouth 3 (three) times daily.     metFORMIN (GLUMETZA) 500 MG (MOD) 24 hr tablet Take 500 mg by mouth daily with breakfast.     metoprolol tartrate (LOPRESSOR) 25 MG tablet Take 25 mg by mouth 2 (two) times daily.     misoprostol (CYTOTEC) 200 MCG tablet Take 200 mcg by mouth 2 (two) times daily as needed (stomach ulcers).     omeprazole (PRILOSEC) 40 MG capsule Take 1 capsule (40 mg total) by mouth daily. 90 capsule 3   pregabalin (LYRICA) 50 MG capsule Take 50 mg by mouth 2 (two) times daily.     spironolactone (ALDACTONE) 25 MG tablet Take 25 mg by mouth daily.     traZODone (DESYREL) 50 MG tablet Take 1-2 tablets (50-100 mg total) by mouth at bedtime. 60 tablet 3   Turmeric (QC TUMERIC COMPLEX PO) Take 1 tablet by mouth daily.     No current facility-administered medications for this visit.     Musculoskeletal: Strength & Muscle Tone: within normal limits and telehealth visit Gait & Station: normal, telehealth visit Patient leans: N/A  Psychiatric Specialty Exam: Review of Systems  There were no vitals taken for this visit.There is no height or weight on file to calculate BMI.  General Appearance: Well Groomed  Eye Contact:  Good  Speech:  Clear and Coherent and Normal Rate  Volume:  Normal  Mood:  Anxious and Depressed, improving  Affect:  Congruent  Thought Process:  Coherent, Goal Directed and Linear  Orientation:  Full (Time, Place, and Person)  Thought Content: WDL and Logical   Suicidal Thoughts:  No  Homicidal Thoughts:  No  Memory:  Immediate;   Good Recent;   Good Remote;   Good  Judgement:  Good  Insight:  Good  Psychomotor Activity:  Normal  Concentration:  Concentration: Fair and Attention Span: Fair  Recall:  Good  Fund of Knowledge: Good  Language: Good  Akathisia:  No  Handed:  Right  AIMS (if indicated): Not done  Assets:  Communication Skills Desire for Improvement Financial  Resources/Insurance Housing Intimacy Social Support  ADL's:  Intact  Cognition: WNL  Sleep:  Good   Screenings: GAD-7  Flowsheet Row Video Visit from 10/13/2022 in [facility] Video Visit from 07/28/2022 in [facility] Video Visit from 05/03/2022 in [facility] Video Visit from 02/02/2022 in [facility] Counselor from 01/06/2022 in [facility]  Total GAD-7 Score 18 13 13 18 20       PHQ2-9    Flowsheet Row Video Visit from 10/13/2022 in [facility] Video Visit from 07/28/2022 in [facility] Video Visit from 05/03/2022 in [facility] Video Visit from 02/02/2022 in [facility] Counselor from 01/06/2022 in [facility]  PHQ-2 Total Score 6 4 3 5 5   PHQ-9 Total Score 24 15 15 21 23       Flowsheet Row ED from 09/19/2022 in [facility] Video Visit from 02/02/2022 in [facility] Counselor from 01/06/2022 in [facility]  C-SSRS RISK CATEGORY No Risk No Risk Low Risk        Assessment and Plan: Patient reports that her anxiety and depression continue to be problematic. She notes that he physical health exacerbates her mental health. Today Cymbalta 80 mg increased to 120 mg daily. She notes that she no longer takes trazodone and request to discontinue it. She will continue hydroxyzine as prescribed.   1. Generalized anxiety disorder  Continue- hydrOXYzine (ATARAX) 25 MG tablet; Take 1 tablet (25 mg total) by mouth 3 (three) times daily.  Dispense: 90 tablet; Refill: 3  2. Major depressive disorder, recurrent episode, moderate (HCC)  Increased- DULoxetine (CYMBALTA) 60 MG capsule; Take 2 capsules (120 mg total) by  mouth daily.  Dispense: 60 capsule; Refill: 3      Follow-up in 2.5 months Follow-up with therapy   Shanna Cisco, NP 10/13/2022, 1:54 PM

## 2022-11-05 ENCOUNTER — Ambulatory Visit (HOSPITAL_BASED_OUTPATIENT_CLINIC_OR_DEPARTMENT_OTHER): Payer: Self-pay | Attending: Adult Health | Admitting: Pulmonary Disease

## 2022-11-05 VITALS — Ht 68.0 in | Wt 365.0 lb

## 2022-11-05 DIAGNOSIS — G4733 Obstructive sleep apnea (adult) (pediatric): Secondary | ICD-10-CM | POA: Insufficient documentation

## 2022-11-08 ENCOUNTER — Ambulatory Visit (INDEPENDENT_AMBULATORY_CARE_PROVIDER_SITE_OTHER): Payer: Self-pay | Admitting: Mental Health

## 2022-11-08 DIAGNOSIS — F331 Major depressive disorder, recurrent, moderate: Secondary | ICD-10-CM

## 2022-11-08 DIAGNOSIS — F411 Generalized anxiety disorder: Secondary | ICD-10-CM

## 2022-11-08 DIAGNOSIS — F900 Attention-deficit hyperactivity disorder, predominantly inattentive type: Secondary | ICD-10-CM

## 2022-11-08 DIAGNOSIS — G4733 Obstructive sleep apnea (adult) (pediatric): Secondary | ICD-10-CM

## 2022-11-08 NOTE — Progress Notes (Signed)
THERAPIST PROGRESS NOTE Virtual Visit via Video Note  I connected with Catherine Koch on 11/08/22 at  2:00 PM EDT by a video enabled telemedicine application and verified that I am speaking with the correct person using two identifiers.  Location: Patient: home address on file Provider: home office   I discussed the limitations of evaluation and management by telemedicine and the availability of in person appointments. The patient expressed understanding and agreed to proceed.  I discussed the assessment and treatment plan with the patient. The patient was provided an opportunity to ask questions and all were answered. The patient agreed with the plan and demonstrated an understanding of the instructions.   The patient was advised to call back or seek an in-person evaluation if the symptoms worsen or if the condition fails to improve as anticipated.  I provided 48 minutes of non-face-to-face time during this encounter.   Catherine Koch, [facility]   Session Time: 2:05 pm (   Participation Level: Active  Behavioral Response: Fairly GroomedAlertDysphoric  Type of Therapy: Individual Therapy  Treatment Goals addressed: STG: Catherine Koch will decrease sxs of anxiety AEB engagement and practice in relaxation and grounding coping skills 7/7 days for the next 6 months.    STG: Catherine Koch decrease sxs of depression AEB development of x 3 effective coping skills with ability to reframe maladaptive thinking patterns 7/7 days or the next x 6 months   ProgressTowards Goals: Progressing  Interventions: CBT and Supportive  Summary: Catherine Koch is a 47 y.o. female who presents with dx of depression and anxiety. Catherine Koch presents alert and oriented; mood and affect low. Drowsy, lethargic. Speech clear and coherent at normal rate and tone. In bed at time of session. Engaged with therapist. Catherine Koch for moods to have been up and down depending on level of pain. Notes difficulty sleeping, tossing and  turning and to have recently just completed a sleep study. Reports some increased attendance in community presenting to family member graduate, best friend in town, however shares will feel "wiped" afterwards. Shares to have been approved for disability and hopeful to to renovate bathroom in order to increase attendance to shower taking. Shares difficulty sleeping previous night and sleepy during session with notable yawning. Notes attempting to cope with depression as she can however has spent high degree of time in bed due to pain and fatigue. Continues to game and spend time with animals as source of coping when needed. Denies SI/HI. Progress with goals; sxs at baseline.  Suicidal/Homicidal: Nowithout intent/plan  Therapist Response: Therapist engaged Catherine Koch in tele-therapy session. Confirmed location and ability to hold confidential session. Completed check in and assessed for current level of stressors and sxs management. Provided space to share thoughts and feelings regarding recent events. Affirmed ability to engage in community and encouraged continued engagement. Provided supportive feedback in navigating family relationships. Assessed for management of moods and feelings of stress. Reviewed session and provided follow up.   Plan: Return again in x 4 weeks.  Diagnosis: Major depressive disorder, recurrent episode, moderate (HCC)  Generalized anxiety disorder  Attention deficit hyperactivity disorder (ADHD), predominantly inattentive type  Collaboration of Care: Other None  Patient/Guardian was advised Release of Information must be obtained prior to any record release in order to collaborate their care with an outside provider. Patient/Guardian was advised if they have not already done so to contact the registration department to sign all necessary forms in order for [place] to release information regarding their care.   Consent:  Patient/Guardian gives verbal consent for treatment and  assignment of benefits for services provided during this visit. Patient/Guardian expressed understanding and agreed to proceed.   [name] [place], [facility] 11/08/2022

## 2022-11-08 NOTE — Procedures (Signed)
Patient Name: Catherine Koch, Catherine Koch Date: 11/05/2022 Gender: Female D.O.B: 1975/09/12 Age (years): 51 Referring Provider: Tammy Parrett Height (inches): 68 Interpreting Physician: Cyril Mourning MD, ABSM Weight (lbs): 365 RPSGT: Cherylann Parr BMI: 55 MRN: [id number] Neck Size: 19.00 <br> <br> CLINICAL INFORMATION The patient is referred for a CPAP titration to treat sleep apnea.    NPSG 01/24/2012-severe obstructive sleep apnea. AHI 62 per hour. CPAP titration to 10 CWP/AHI 1.4 per hour  SLEEP STUDY TECHNIQUE As per the AASM Manual for the Scoring of Sleep and Associated Events v2.3 (April 2016) with a hypopnea requiring 4% desaturations.  The channels recorded and monitored were frontal, central and occipital EEG, electrooculogram (EOG), submentalis EMG (chin), nasal and oral airflow, thoracic and abdominal wall motion, anterior tibialis EMG, snore microphone, electrocardiogram, and pulse oximetry. Continuous positive airway pressure (CPAP) was initiated at the beginning of the study and titrated to treat sleep-disordered breathing.  MEDICATIONS Medications self-administered by patient taken the night of the study : N/A  TECHNICIAN COMMENTS Comments added by technician: Patient had difficulty initiating sleep. Patient was restless all through the night. Comments added by scorer: N/A RESPIRATORY PARAMETERS Optimal PAP Pressure (cm): 17 AHI at Optimal Pressure (/hr): 0 Overall Minimal O2 (%): 87.0 Supine % at Optimal Pressure (%): 100 Minimal O2 at Optimal Pressure (%): 93.0   SLEEP ARCHITECTURE The study was initiated at 10:06:40 PM and ended at 4:23:18 AM.  Sleep onset time was 9.8 minutes and the sleep efficiency was 85.0%. The total sleep time was 320 minutes.  The patient spent 2.8% of the night in stage N1 sleep, 73.8% in stage N2 sleep, 0.0% in stage N3 and 23.4% in REM.Stage REM latency was 291.0 minutes  Wake after sleep onset was 46.8. Alpha intrusion was absent.  Supine sleep was 37.94%.  CARDIAC DATA The 2 lead EKG demonstrated sinus rhythm. The mean heart rate was 63.6 beats per minute. Other EKG findings include: None.   LEG MOVEMENT DATA The total Periodic Limb Movements of Sleep (PLMS) were 0. The PLMS index was 0.0. A PLMS index of <15 is considered normal in adults.  IMPRESSIONS - The optimal PAP pressure was 17 cm of water. - Mild oxygen desaturations were observed during this titration (min O2 = 87.0%). - No snoring was audible during this study. - No cardiac abnormalities were observed during this study. - Clinically significant periodic limb movements were not noted during this study. Arousals associated with PLMs were rare.   DIAGNOSIS - Obstructive Sleep Apnea (G47.33)   RECOMMENDATIONS - Trial of CPAP therapy on 17 cm H2O with a Medium size Fisher&Paykel Full Face Simplus mask and heated humidification. Alternatively autoCPAP 13-17 cm can be used - Avoid alcohol, sedatives and other CNS depressants that may worsen sleep apnea and disrupt normal sleep architecture. - Sleep hygiene should be reviewed to assess factors that may improve sleep quality. - Weight management and regular exercise should be initiated or continued. - Return to [facility] Center for re-evaluation after 4 weeks of therapy  Cyril Mourning MD Board Certified in Sleep medicine

## 2022-11-18 ENCOUNTER — Telehealth: Payer: Self-pay | Admitting: Adult Health

## 2022-11-18 DIAGNOSIS — G4733 Obstructive sleep apnea (adult) (pediatric): Secondary | ICD-10-CM

## 2022-11-18 NOTE — Telephone Encounter (Signed)
Titration study shows optimal CPAP control at 17cmH2O.  Please order new CPAP machine  - 17cmH2o.   Needs ov in 3 months for follow up

## 2022-11-29 NOTE — Telephone Encounter (Signed)
Called and spoke with patient, advised her of results of CPAP titration study and need to order new CPAP machine.  She verbalized understanding.  She will call the office to schedule her f/u after she receives her new CPAP machine.  Nothing further needed.

## 2022-12-13 ENCOUNTER — Ambulatory Visit (INDEPENDENT_AMBULATORY_CARE_PROVIDER_SITE_OTHER): Payer: Self-pay | Admitting: Mental Health

## 2022-12-13 DIAGNOSIS — F900 Attention-deficit hyperactivity disorder, predominantly inattentive type: Secondary | ICD-10-CM

## 2022-12-13 DIAGNOSIS — F411 Generalized anxiety disorder: Secondary | ICD-10-CM

## 2022-12-13 DIAGNOSIS — F331 Major depressive disorder, recurrent, moderate: Secondary | ICD-10-CM

## 2022-12-13 NOTE — Progress Notes (Signed)
THERAPIST PROGRESS NOTE Virtual Visit via Video Note  I connected with Catherine Koch on 12/13/22 at  2:00 PM EDT by a video enabled telemedicine application and verified that I am speaking with the correct person using two identifiers.  Location: Patient: home address on file Provider: home office    I discussed the limitations of evaluation and management by telemedicine and the availability of in person appointments. The patient expressed understanding and agreed to proceed.  I discussed the assessment and treatment plan with the patient. The patient was provided an opportunity to ask questions and all were answered. The patient agreed with the plan and demonstrated an understanding of the instructions.   The patient was advised to call back or seek an in-person evaluation if the symptoms worsen or if the condition fails to improve as anticipated.  I provided 47 minutes of non-face-to-face time during this encounter.   [name], [facility]   Session Time: 2:02 pm( 47 minutes)  Participation Level: Active  Behavioral Response: DisheveledDrowsyDysphoric  Type of Therapy: Individual Therapy  Treatment Goals addressed:  STG: Samaya will decrease sxs of anxiety AEB engagement and practice in relaxation and grounding coping skills 7/7 days for the next 6 months.    STG: Tanicka decrease sxs of depression AEB development of x 3 effective coping skills with ability to reframe maladaptive thinking patterns 7/7 days or the next x 6 months   ProgressTowards Goals: Progressing  Interventions: CBT and Supportive  Summary: Catherine Koch is a 47 y.o. female who presents with dx of depression and anxiety. Catherine Koch presents alert and oriented; mood and affect low. Drowsy, lethargic. Speech clear and coherent at normal rate and tone. In bed at time of session, lights off but able to be seen. Engaged with therapist. Catherine Koch for moods to have continued to be low with presence of high  degree of pain leading to high degree of being in the bed. Notes to have started to receive disability payments and looking forward to back pay and ability to have needed repairs made to home. Reports financial stressor of losing medicaid benefits and needing to sign up for Medicare part D to help cover prescriptions with one prescription costing high degree of money that she does not have. Shares to have good days and bad days and will get out bed move around house and play video games when she is well. Shares unable to shower and attend to hygiene due to pain. Shares for husband to have expressed desire to be intimate however shares to decline to due to pain and his hygiene concerns. Notes attempting to cope best she an with current circumstances and will explore options with medical provided to support in pain management. Denies excessive anxiety. Shares use of coping thoughts, watching T.V and playing video games to cope. progress with goals.    Suicidal/Homicidal: Nowithout intent/plan  Therapist Response: Therapist engaged Catherine Koch in tele-therapy session. Confirmed location and ability to hold confidential session. Completed check in and assessed for current level of stressors and sxs management. Provided safe space for Catherine Koch to emote on concerns. Provided supportive feedback; validated feelings. Provided support and encouragement. Supported Music therapist in processing concerns for medical health and ability to cope. Supported in discussing concerns for marriage and concerns for husband. Encouraged following through with needed support to assist with cleanliness of home and encouraged exploration of personal aide/care attendant if able. Reviewed session and provided follow up. No safety concerns reported.  Plan: Return again in  x 6 weeks.  Diagnosis: Major depressive disorder, recurrent episode, moderate (HCC)  Generalized anxiety disorder  Attention deficit hyperactivity disorder (ADHD), predominantly  inattentive type  Collaboration of Care: Other None  Patient/Guardian was advised Release of Information must be obtained prior to any record release in order to collaborate their care with an outside provider. Patient/Guardian was advised if they have not already done so to contact the registration department to sign all necessary forms in order for [place] to release information regarding their care.   Consent: Patient/Guardian gives verbal consent for treatment and assignment of benefits for services provided during this visit. Patient/Guardian expressed understanding and agreed to proceed.   [name] [place], [facility] 12/13/2022

## 2023-01-04 ENCOUNTER — Encounter (HOSPITAL_COMMUNITY): Payer: Self-pay | Admitting: Psychiatry

## 2023-01-04 ENCOUNTER — Telehealth (INDEPENDENT_AMBULATORY_CARE_PROVIDER_SITE_OTHER): Payer: Self-pay | Admitting: Psychiatry

## 2023-01-04 DIAGNOSIS — F331 Major depressive disorder, recurrent, moderate: Secondary | ICD-10-CM

## 2023-01-04 DIAGNOSIS — F411 Generalized anxiety disorder: Secondary | ICD-10-CM

## 2023-01-04 DIAGNOSIS — F330 Major depressive disorder, recurrent, mild: Secondary | ICD-10-CM

## 2023-01-04 MED ORDER — TRAZODONE HCL 50 MG PO TABS
50.0000 mg | ORAL_TABLET | Freq: Every day | ORAL | 3 refills | Status: DC
Start: 2023-01-04 — End: 2023-03-30

## 2023-01-04 MED ORDER — DULOXETINE HCL 60 MG PO CPEP: 120.0000 mg | ORAL_CAPSULE | Freq: Every day | ORAL | 3 refills | Status: DC

## 2023-01-04 MED ORDER — HYDROXYZINE HCL 50 MG PO TABS: 50.0000 mg | ORAL_TABLET | Freq: Three times a day (TID) | ORAL | 3 refills | Status: DC

## 2023-01-04 NOTE — Progress Notes (Addendum)
BH MD/PA/NP OP Progress Note Virtual Visit via Video Note  I connected with Catherine Koch on 01/11/23 at  3:00 PM EDT by a video enabled telemedicine application and verified that I am speaking with the correct person using two identifiers.  Location: Patient: Home Provider: Clinic   I discussed the limitations of evaluation and management by telemedicine and the availability of in person appointments. The patient expressed understanding and agreed to proceed.  I provided 30 minutes of non-face-to-face time during this encounter.    01/11/2023 1:20 PM DESJA BORO  MRN:  [id number]  Chief Complaint: "I have been approved for disability"   HPI: 47 year old female seen today  for follow-up psychiatric evaluation. She has a history of auditory processing disorder (as a child), depression, PTSD,  and anxiety, and is currently managed on Hydroxyzine 25mg  three times daily as needed, Strattera 40 mg Cymbalta 120 mg daily, and Trazodone 150 mg at bedtime. She informed Clinical research associate that her medications somewhat are effective in managing her psychiatric conditions.    Today she was well groomed, pleasant, cooperative, and engaged in conversation. She informed Clinical research associate that she was recently her disability was approved. Patient notes that she was off her medication for a week but recently restarted it now that she can afford it. Patient notes that mentally she has been up and down.  She notes that her mental health is exacerbated by her physical health.  She informed Clinical research associate that she continues to suffer from fibromyalgia as well as spondylosis.  Patient quantifies her pain today as 8 out of 10.  She notes that Lyrica has been effective but notes that she has been without it because she cannot afford it.  Patient also notes that she has been more worried about finance and notes that she and her husband are 2 months behind on their mortgage.  She notes that she wishes he could better manage funds. She also  reports that she is not able to clean her home because of her pain. She notes that this has been overwhelming. She is also concerned about her health as she is on an antibiotic for a respiratory infection.   Patient notes that the above exacerbates her anxiety and depression.  Today provider conducted a GAD-7 and patient scored a 18, at her last visit she scored a 18.  Provider also conducted PHQ-9 and patient scored a 18, at her last visit she scored 24.  She endorses fluctuation in appetite. She notes that she sleeps 4-8 hours with frequent naps. Today she denies SI/HI/VAH, mania, or paranoia.    Patient reports that she has been smelling things that are not there.  She describes smelling smoke frequently.  She reports that this has been going on for the last month.  Provider asked patient if this occurred after she discontinued her medication needs.  She notes that it did.  Provider informed her that there could be a side effect of discontinuing all of her medications, induced by anxiety, or a chemical imbalance. Provider recommended patient taking her medication again consistently prior to adding antipsychotics.  She endorsed understanding and agreed.  Provider informed patient that if antipsychotic need to be tried Vraylar or Abilify could be a potential.  She endorsed understanding and agreed.  Today hydroxyzine 25 mg 3 times daily increased to 50 mg 3 times daily to help manage anxiety.  She will continue all other medications as prescribed.  No other concerns noted at this time.  Visit Diagnosis:    ICD-10-CM   1. Major depressive disorder, recurrent episode, moderate (HCC)  F33.1 DULoxetine (CYMBALTA) 60 MG capsule    hydrOXYzine (ATARAX) 50 MG tablet    traZODone (DESYREL) 50 MG tablet    2. Generalized anxiety disorder  F41.1 hydrOXYzine (ATARAX) 50 MG tablet    traZODone (DESYREL) 50 MG tablet       Past Psychiatric History: Auditory processing disorder (as a child), PTSD, anxiety,  and depression  Past Medical History:  Past Medical History:  Diagnosis Date   ADHD    Anxiety    Arthritis    pt. states in knees   Chronic gastritis    Chronic sinus infection    Closed fracture of right distal fibula 11/19/2013   Complication of anesthesia    pt. states difficult to wake up   Depression    GERD (gastroesophageal reflux disease)    Headache    pt. states random migraines   Hemorrhoids    High cholesterol    History of bronchitis    History of degenerative disc disease    Hypertension    Ovarian failure    PTSD (post-traumatic stress disorder)    Shortness of breath    exertion from crutches   Sleep apnea    cpap   UTI (lower urinary tract infection)    Wears glasses     Past Surgical History:  Procedure Laterality Date   BIOPSY  12/10/2019   Procedure: BIOPSY;  Surgeon: Napoleon Form, MD;  Location: [facility];  Service: Endoscopy;;   CHOLECYSTECTOMY  2010   CHOLECYSTECTOMY     COLONOSCOPY WITH PROPOFOL N/A 12/10/2019   Procedure: COLONOSCOPY WITH PROPOFOL;  Surgeon: Napoleon Form, MD;  Location: [facility];  Service: Endoscopy;  Laterality: N/A;   ESOPHAGOGASTRODUODENOSCOPY     ESOPHAGOGASTRODUODENOSCOPY (EGD) WITH PROPOFOL N/A 12/10/2019   Procedure: ESOPHAGOGASTRODUODENOSCOPY (EGD) WITH PROPOFOL;  Surgeon: Napoleon Form, MD;  Location: [facility];  Service: Endoscopy;  Laterality: N/A;   NASAL SEPTOPLASTY W/ TURBINOPLASTY Bilateral 12/11/2014   Procedure: NASAL SEPTOPLASTY AND BILATERAL INFERIOR TURBINATE REDUCTION;  Surgeon: Osborn Coho, MD;  Location: [facility] OR;  Service: ENT;  Laterality: Bilateral;   ORIF FIBULA FRACTURE Right 11/19/2013   Procedure: OPEN REDUCTION INTERNAL FIXATION (ORIF) RIGHT FIBULA FRACTURE;  Surgeon: Eulas Post, MD;  Location: [facility] OR;  Service: Orthopedics;  Laterality: Right;   WISDOM TOOTH EXTRACTION      Family Psychiatric History: Sister anxiety and depression. Notes mother has untreated  mental health conditions  Family History:  Family History  Problem Relation Age of Onset   Depression Sister    Allergies Sister    Cancer Paternal Grandfather    Heart disease Paternal Grandmother    Cancer Paternal Grandmother        BREAST CANCER    Social History:  Social History   Socioeconomic History   Marital status: Married    Spouse name: Not on file   Number of children: 0   Years of education: Not on file   Highest education level: Some college, no degree  Occupational History    Comment: UNEMPOLYEED   Tobacco Use   Smoking status: Former    Current packs/day: 0.00    Types: Cigarettes    Quit date: 05/24/2010    Years since quitting: 12.6    Passive exposure: Never   Smokeless tobacco: Never   Tobacco comments:    Pt. states she used to be a social smoker  Vaping Use   Vaping status: Never Used  Substance and Sexual Activity   Alcohol use: Yes   Drug use: No   Sexual activity: Yes    Birth control/protection: None  Other Topics Concern   Not on file  Social History Narrative   Not on file   Social Determinants of Health   Financial Resource Strain: High Risk (01/06/2022)   Overall Financial Resource Strain (CARDIA)    Difficulty of Paying Living Expenses: Hard  Food Insecurity: Food Insecurity Present (01/06/2022)   Hunger Vital Sign    Worried About Running Out of Food in the Last Year: Sometimes true    Ran Out of Food in the Last Year: Sometimes true  Transportation Needs: No Transportation Needs (01/06/2022)   PRAPARE - Administrator, Civil Service (Medical): No    Lack of Transportation (Non-Medical): No  Physical Activity: Inactive (01/06/2022)   Exercise Vital Sign    Days of Exercise per Week: 0 days    Minutes of Exercise per Session: 0 min  Stress: Stress Concern Present (01/06/2022)   [employer] of Occupational Health - Occupational Stress Questionnaire    Feeling of Stress : Very much  Social Connections: Unknown  (02/16/2022)   [facility] from [facility]   Social Network    Social Network: Not on file  Recent Concern: Social Connections - Socially Isolated (01/06/2022)   Social Connection and Isolation Panel [NHANES]    Frequency of Communication with Friends and Family: Once a week    Frequency of Social Gatherings with Friends and Family: Never    Attends Religious Services: Never    Database administrator or Organizations: No    Attends Banker Meetings: Never    Marital Status: Married    Allergies:  Allergies  Allergen Reactions   Bee Pollen Anaphylaxis, Swelling, Hives and Itching    Other reaction(s): eye redness   Sulfa Antibiotics Anaphylaxis, Swelling and Rash    Throat swelling/ rashes    Metabolic Disorder Labs: No results found for: "HGBA1C", "MPG" No results found for: "PROLACTIN" No results found for: "CHOL", "TRIG", "HDL", "CHOLHDL", "VLDL", "LDLCALC" Lab Results  Component Value Date   TSH 1.90 04/11/2012    Therapeutic Level Labs: No results found for: "LITHIUM" No results found for: "VALPROATE" No results found for: "CBMZ"  Current Medications: Current Outpatient Medications  Medication Sig Dispense Refill   colesevelam (WELCHOL) 625 MG tablet Take 1 tablet (625 mg total) by mouth daily with breakfast. 90 tablet 3   DULoxetine (CYMBALTA) 60 MG capsule Take 2 capsules (120 mg total) by mouth daily. 60 capsule 3   hydrOXYzine (ATARAX) 50 MG tablet Take 1 tablet (50 mg total) by mouth 3 (three) times daily. 90 tablet 3   IBU 600 MG tablet Take 600 mg by mouth 3 (three) times daily.     metFORMIN (GLUMETZA) 500 MG (MOD) 24 hr tablet Take 500 mg by mouth daily with breakfast.     metoprolol tartrate (LOPRESSOR) 25 MG tablet Take 25 mg by mouth 2 (two) times daily.     misoprostol (CYTOTEC) 200 MCG tablet Take 200 mcg by mouth 2 (two) times daily as needed (stomach ulcers).     omeprazole (PRILOSEC) 40 MG capsule Take 1 capsule (40 mg  total) by mouth daily. 90 capsule 3   pregabalin (LYRICA) 50 MG capsule Take 50 mg by mouth 2 (two) times daily.     spironolactone (ALDACTONE) 25 MG tablet  Take 25 mg by mouth daily.     traZODone (DESYREL) 50 MG tablet Take 1-2 tablets (50-100 mg total) by mouth at bedtime. 60 tablet 3   Turmeric (QC TUMERIC COMPLEX PO) Take 1 tablet by mouth daily.     No current facility-administered medications for this visit.     Musculoskeletal: Strength & Muscle Tone: within normal limits and telehealth visit Gait & Station: normal, telehealth visit Patient leans: N/A  Psychiatric Specialty Exam: Review of Systems  There were no vitals taken for this visit.There is no height or weight on file to calculate BMI.  General Appearance: Well Groomed  Eye Contact:  Good  Speech:  Clear and Coherent and Normal Rate  Volume:  Normal  Mood:  Anxious and Depressed  Affect:  Congruent  Thought Process:  Coherent, Goal Directed and Linear  Orientation:  Full (Time, Place, and Person)  Thought Content: Logical and Hallucinations: Olfactory   Suicidal Thoughts:  No  Homicidal Thoughts:  No  Memory:  Immediate;   Good Recent;   Good Remote;   Good  Judgement:  Good  Insight:  Good  Psychomotor Activity:  Normal  Concentration:  Concentration: Fair and Attention Span: Fair  Recall:  Good  Fund of Knowledge: Good  Language: Good  Akathisia:  No  Handed:  Right  AIMS (if indicated): Not done  Assets:  Communication Skills Desire for Improvement Financial Resources/Insurance Housing Intimacy Social Support  ADL's:  Intact  Cognition: WNL  Sleep:  Fair   Screenings: GAD-7    Flowsheet Row Video Visit from 01/04/2023 in [facility] Video Visit from 10/13/2022 in [facility] Video Visit from 07/28/2022 in [facility] Video Visit from 05/03/2022 in [facility] Video Visit from  02/02/2022 in [facility]  Total GAD-7 Score 18 18 13 13 18       PHQ2-9    Flowsheet Row Video Visit from 01/04/2023 in [facility] Video Visit from 10/13/2022 in [facility] Video Visit from 07/28/2022 in [facility] Video Visit from 05/03/2022 in [facility] Video Visit from 02/02/2022 in [facility]  PHQ-2 Total Score 4 6 4 3 5   PHQ-9 Total Score 18 24 15 15 21       Flowsheet Row ED from 09/19/2022 in [facility] Emergency Department at [facility] Video Visit from 02/02/2022 in [facility] Counselor from 01/06/2022 in [facility]  C-SSRS RISK CATEGORY No Risk No Risk Low Risk        Assessment and Plan: Patient endorses increased anxiety, depression, poor sleep, and olfactory hallucinations.  Patient reports that she has been smelling things that are not there.  She describes smelling smoke frequently.  She reports that this has been going on for the last month.  Provider asked patient if this occurred after she discontinued her medication needs.  She notes that it did.  Provider informed her that there could be a side effect of discontinuing all of her medications, induced by anxiety, or a chemical imbalance. Provider recommended patient taking her medication again consistently prior to adding antipsychotics.  She endorsed understanding and agreed.  Provider informed patient that if antipsychotic need to be tried Vraylar or Abilify could be a potential.  She endorsed understanding and agreed.  Today hydroxyzine 25 mg 3 times daily increased  to 50 mg 3 times daily to help manage anxiety.  She will continue all other medications as prescribed.  1. Major depressive disorder, recurrent episode, moderate (HCC)  Continue- DULoxetine (CYMBALTA) 60 MG capsule; Take 2  capsules (120 mg total) by mouth daily.  Dispense: 60 capsule; Refill: 3  2. Generalized anxiety disorder  Increased- hydrOXYzine (ATARAX) 50 MG tablet; Take 1 tablet (50 mg total) by mouth 3 (three) times daily.  Dispense: 90 tablet; Refill: 3 Continue- traZODone (DESYREL) 50 MG tablet; Take 1-2 tablets (50-100 mg total) by mouth at bedtime.  Dispense: 60 tablet; Refill: 3     Follow-up in 2.5 months Follow-up with therapy   Shanna Cisco, NP 01/11/2023, 1:20 PM

## 2023-01-05 ENCOUNTER — Telehealth (HOSPITAL_COMMUNITY): Payer: Self-pay | Admitting: Psychiatry

## 2023-01-11 ENCOUNTER — Other Ambulatory Visit: Payer: Self-pay | Admitting: Physician Assistant

## 2023-01-11 DIAGNOSIS — Z1231 Encounter for screening mammogram for malignant neoplasm of breast: Secondary | ICD-10-CM

## 2023-01-31 ENCOUNTER — Ambulatory Visit (INDEPENDENT_AMBULATORY_CARE_PROVIDER_SITE_OTHER): Payer: Self-pay | Admitting: Mental Health

## 2023-01-31 DIAGNOSIS — F411 Generalized anxiety disorder: Secondary | ICD-10-CM

## 2023-01-31 DIAGNOSIS — F900 Attention-deficit hyperactivity disorder, predominantly inattentive type: Secondary | ICD-10-CM

## 2023-01-31 DIAGNOSIS — F331 Major depressive disorder, recurrent, moderate: Secondary | ICD-10-CM

## 2023-01-31 NOTE — Progress Notes (Unsigned)
THERAPIST PROGRESS NOTE Virtual Visit via Video Note  I connected with Catherine Koch on 01/31/23 at  2:00 PM EDT by a video enabled telemedicine application and verified that I am speaking with the correct person using two identifiers.  Location: Patient: home address on file Provider: home office   I discussed the limitations of evaluation and management by telemedicine and the availability of in person appointments. The patient expressed understanding and agreed to proceed.  I discussed the assessment and treatment plan with the patient. The patient was provided an opportunity to ask questions and all were answered. The patient agreed with the plan and demonstrated an understanding of the instructions.   The patient was advised to call back or seek an in-person evaluation if the symptoms worsen or if the condition fails to improve as anticipated.  I provided 50 minutes of non-face-to-face time during this encounter.   Catherine Koch, [facility]   Session Time: 2:03 pm ( 50 minutes)  Participation Level: Active  Behavioral Response: Fairly GroomedAlertDysphoric  Type of Therapy: Individual Therapy  Treatment Goals addressed: STG: Catie will decrease sxs of anxiety AEB engagement and practice in relaxation and grounding coping skills 7/7 days for the next 6 months.    STG: Elyanna decrease sxs of depression AEB development of x 3 effective coping skills with ability to reframe maladaptive thinking patterns 7/7 days or the next x 6 months   ProgressTowards Goals: Progressing  Interventions: Supportive  Summary: Catherine Koch is a 47 y.o. female who presents with dx of depression and anxiety. Echo presents alert and oriented; mood and affect low. Drowsy, lethargic. Speech clear and coherent at normal rate and tone. Shares continues to work to manage low moods provided ongoing stressors of pain, difficulty with mobility and financial stressors. Notes for husband to have  been more irritable and shares for his birthday to be approaching along with the anniversary of his mother's passing. Reports difficulty attending to showers due to pain and bathroom being in disrepair. Shares hopefull with working to have bariatric surgery and hoping this will support with reducing pain and increasing level of energy. Explored with therapist how losing weight may be able to help with mental health and quality of life with ability to increase attendance to showers, increase community engagement and ability to clean and maintain home. Shares plans to follow through with ability to explore ability to secure in home aide. Shares use of video games and her pets as means of coping. Shares for depression and anxiety to be manageable, taking medications as prescribed and working to engage in balanced thoughts; denies maladaptive thinking patterns. Denies SI/HI   Suicidal/Homicidal: Nowithout intent/plan  Therapist Response: Therapist engaged [place] in tele-therapy session. Confirmed location and ability to hold confidential session. Completed check in and assessed for current level of stressors and sxs management. Provided safe space for Konni to shares thoughts and feelings in regard to stressors. Active empathic listening; providing support and encouragement. Encouraged working to continue to follow up with receiving back pay and explored ways in which use of money could support with increasing qualify of life. Encouraged following up with PCP for in home aide to support in attending to things in which mobility limits her. Explored ability to engage in coping skills and maintain cognitive coping and balanced thinking. Reviewed session and provided follow up. No safety concerns reported.   Plan: Return again in  x 6 weeks.  Diagnosis: Major depressive disorder, recurrent episode, moderate (HCC)  Generalized anxiety disorder  Attention deficit hyperactivity disorder (ADHD), predominantly  inattentive type  Collaboration of Care: Other None  Patient/Guardian was advised Release of Information must be obtained prior to any record release in order to collaborate their care with an outside provider. Patient/Guardian was advised if they have not already done so to contact the registration department to sign all necessary forms in order for [place] to release information regarding their care.   Consent: Patient/Guardian gives verbal consent for treatment and assignment of benefits for services provided during this visit. Patient/Guardian expressed understanding and agreed to proceed.   [name] [place], [facility] 01/31/2023

## 2023-02-08 ENCOUNTER — Ambulatory Visit
Admission: RE | Admit: 2023-02-08 | Discharge: 2023-02-08 | Disposition: A | Discharge status: Home / Self Care | Payer: Medicare HMO | Source: Ambulatory Visit | Attending: Physician Assistant | Admitting: Physician Assistant

## 2023-02-08 DIAGNOSIS — Z1231 Encounter for screening mammogram for malignant neoplasm of breast: Secondary | ICD-10-CM

## 2023-03-14 ENCOUNTER — Ambulatory Visit (INDEPENDENT_AMBULATORY_CARE_PROVIDER_SITE_OTHER): Payer: Medicare HMO | Admitting: Mental Health

## 2023-03-14 ENCOUNTER — Telehealth: Payer: Medicare HMO | Admitting: Physician Assistant

## 2023-03-14 DIAGNOSIS — F331 Major depressive disorder, recurrent, moderate: Secondary | ICD-10-CM | POA: Diagnosis not present

## 2023-03-14 DIAGNOSIS — F411 Generalized anxiety disorder: Secondary | ICD-10-CM

## 2023-03-14 DIAGNOSIS — J40 Bronchitis, not specified as acute or chronic: Secondary | ICD-10-CM

## 2023-03-14 DIAGNOSIS — J329 Chronic sinusitis, unspecified: Secondary | ICD-10-CM | POA: Diagnosis not present

## 2023-03-14 MED ORDER — BENZONATATE 100 MG PO CAPS
100.0000 mg | ORAL_CAPSULE | Freq: Three times a day (TID) | ORAL | 0 refills | Status: DC | PRN
Start: 2023-03-14 — End: 2023-12-14

## 2023-03-14 MED ORDER — AMOXICILLIN-POT CLAVULANATE 875-125 MG PO TABS
1.0000 | ORAL_TABLET | Freq: Two times a day (BID) | ORAL | 0 refills | Status: DC
Start: 2023-03-14 — End: 2023-08-22

## 2023-03-14 NOTE — Progress Notes (Signed)
THERAPIST PROGRESS NOTE Virtual Visit via Video Note  I connected with Catherine Koch on 03/14/23 at  2:00 PM EDT by a video enabled telemedicine application and verified that I am speaking with the correct person using two identifiers.  Location: Patient: home address on file Provider: home office   I discussed the limitations of evaluation and management by telemedicine and the availability of in person appointments. The patient expressed understanding and agreed to proceed.  I discussed the assessment and treatment plan with the patient. The patient was provided an opportunity to ask questions and all were answered. The patient agreed with the plan and demonstrated an understanding of the instructions.   The patient was advised to call back or seek an in-person evaluation if the symptoms worsen or if the condition fails to improve as anticipated.  I provided 45 minutes of non-face-to-face time during this encounter.   Catherine Koch, [facility]   Session Time: 2:03 pm ( 45 minutes)  Participation Level: Active  Behavioral Response: CasualAlertAdequate  Type of Therapy: Individual Therapy  Treatment Goals addressed: STG: Catherine Koch will decrease sxs of anxiety AEB engagement and practice in relaxation and grounding coping skills 7/7 days for the next 6 months.    STG: Catherine Koch decrease sxs of depression AEB development of x 3 effective coping skills with ability to reframe maladaptive thinking patterns 7/7 days or the next x 6 months   ProgressTowards Goals: Progressing  Interventions: CBT and Supportive  Summary: Catherine Koch is a 47 y.o. female who presents with dx of depression and anxiety. [name] presents alert and oriented; mood and affect adequate. Drowsy, lethargic. Speech clear and coherent at normal rate and tone. Shares continues to have low mood and shares for moods to largely be dependent on husband, "He has anger issues" Notes for him to have started to see  his therapist again but denies taking medications. Shares some improvement secondary to relief of financial concerns with having ability to receive her disability benefits and was able to start getting work done to the home. Shares ongoing pain and to feel as if she is getting sick. Shares desire to stream and do things however pain can get in the way. Shares ongoing work to [employer] and coping with stress. Ongoing work towards goals; denies safety concerns.    Suicidal/Homicidal: Nowithout intent/plan  Therapist Response:  Therapist engaged Catherine Koch in tele-therapy session. Confirmed location and ability to hold confidential session. Completed check in and assessed for current level of stressors and sxs management. Provided safe space for Catherine Koch to shares thoughts and feelings in regard to stressors. Active empathic listening. Explored husbands desire to engage in services and explored ability to engage in effective communication with husband. Explored for ability to manage moods and anxiety with use of coping and relaxation. Encouraged ongoing attendance to self-care and managing medical health. Reviewed session and provided follow up.    Plan: Return again in  x 5 weeks.  Diagnosis: Major depressive disorder, recurrent episode, moderate (HCC)  Generalized anxiety disorder  Collaboration of Care: Other None  Patient/Guardian was advised Release of Information must be obtained prior to any record release in order to collaborate their care with an outside provider. Patient/Guardian was advised if they have not already done so to contact the registration department to sign all necessary forms in order for [place] to release information regarding their care.   Consent: Patient/Guardian gives verbal consent for treatment and assignment of benefits for services provided during  this visit. Patient/Guardian expressed understanding and agreed to proceed.   Catherine Koch [place], [facility] 03/14/2023

## 2023-03-14 NOTE — Progress Notes (Signed)

## 2023-03-25 ENCOUNTER — Other Ambulatory Visit: Payer: Self-pay | Admitting: Gastroenterology

## 2023-03-30 ENCOUNTER — Encounter (HOSPITAL_COMMUNITY): Payer: Self-pay | Admitting: Psychiatry

## 2023-03-30 ENCOUNTER — Telehealth (INDEPENDENT_AMBULATORY_CARE_PROVIDER_SITE_OTHER): Payer: Medicare HMO | Admitting: Psychiatry

## 2023-03-30 DIAGNOSIS — F411 Generalized anxiety disorder: Secondary | ICD-10-CM | POA: Diagnosis not present

## 2023-03-30 DIAGNOSIS — F331 Major depressive disorder, recurrent, moderate: Secondary | ICD-10-CM | POA: Diagnosis not present

## 2023-03-30 MED ORDER — DULOXETINE HCL 60 MG PO CPEP
120.0000 mg | ORAL_CAPSULE | Freq: Every day | ORAL | 3 refills | Status: DC
Start: 2023-03-30 — End: 2023-06-09

## 2023-03-30 MED ORDER — HYDROXYZINE HCL 50 MG PO TABS: 50.0000 mg | ORAL_TABLET | Freq: Three times a day (TID) | ORAL | 3 refills | Status: DC

## 2023-03-30 NOTE — Progress Notes (Signed)
BH MD/PA/NP OP Progress Note Virtual Visit via Video Note  I connected with Catherine Koch on 03/30/23 at  3:30 PM EST by a video enabled telemedicine application and verified that I am speaking with the correct person using two identifiers.  Location: Patient: Home Provider: Clinic   I discussed the limitations of evaluation and management by telemedicine and the availability of in person appointments. The patient expressed understanding and agreed to proceed.  I provided 30 minutes of non-face-to-face time during this encounter.    03/30/2023 12:25 PM Catherine Koch  MRN:  [id number]  Chief Complaint: "I finally got my c-pap"   HPI: 47 year old female seen today  for follow-up psychiatric evaluation. She has a history of auditory processing disorder (as a child), depression, PTSD,  and anxiety, and is currently managed on Hydroxyzine 25mg  three times daily as needed and Cymbalta 120 mg daily, and She informed writer that her medications  are effective in managing her psychiatric conditions.    Today she was well groomed, pleasant, cooperative, and engaged in conversation. She informed Clinical research associate that she finally got approved for her C-pap and is sleeping better. Patient notes that she continues have issues showering do to physical limitations (fibromyalgia as well as spondylosis) and uses wet wipes to bathe. She reports that her shower is to narrow for her. She notes that recently she made renovations to her showerhead Contractor). Patient also notes that her floors in her home has been renovated.  Since her last visit she notes that she was able to get an appointment with [facility]. She reports that she is excited to get formal testing.   Despite the above stressors patient notes that she finds her medications effective and request that they not be adjusted.  She does continue to endorse symptoms of anxiety and depression.  Today provider conducted a GAD-7 and patient scored a  12, at her last visit she scored a 18.  Provider also conducted PHQ-9 and patient scored a 18, at her last visit she scored 18.  She endorses fluctuation in appetite.  Today she denies SI/HI/VAH, mania, or paranoia.    At this time patient reports that her medications not be adjusted.  No medication changes made today.  Patient agreeable to taking medication as prescribed.  No other concerns noted at this time.    Visit Diagnosis:    ICD-10-CM   1. Major depressive disorder, recurrent episode, moderate (HCC)  F33.1 hydrOXYzine (ATARAX) 50 MG tablet    DULoxetine (CYMBALTA) 60 MG capsule    2. Generalized anxiety disorder  F41.1 hydrOXYzine (ATARAX) 50 MG tablet        Past Psychiatric History: Auditory processing disorder (as a child), PTSD, anxiety, and depression  Past Medical History:  Past Medical History:  Diagnosis Date   ADHD    Anxiety    Arthritis    pt. states in knees   Chronic gastritis    Chronic sinus infection    Closed fracture of right distal fibula 11/19/2013   Complication of anesthesia    pt. states difficult to wake up   Depression    GERD (gastroesophageal reflux disease)    Headache    pt. states random migraines   Hemorrhoids    High cholesterol    History of bronchitis    History of degenerative disc disease    Hypertension    Ovarian failure    PTSD (post-traumatic stress disorder)    Shortness of breath  exertion from crutches   Sleep apnea    cpap   UTI (lower urinary tract infection)    Wears glasses     Past Surgical History:  Procedure Laterality Date   BIOPSY  12/10/2019   Procedure: BIOPSY;  Surgeon: Napoleon Form, MD;  Location: [facility];  Service: Endoscopy;;   CHOLECYSTECTOMY  2010   CHOLECYSTECTOMY     COLONOSCOPY WITH PROPOFOL N/A 12/10/2019   Procedure: COLONOSCOPY WITH PROPOFOL;  Surgeon: Napoleon Form, MD;  Location: [facility];  Service: Endoscopy;  Laterality: N/A;   ESOPHAGOGASTRODUODENOSCOPY      ESOPHAGOGASTRODUODENOSCOPY (EGD) WITH PROPOFOL N/A 12/10/2019   Procedure: ESOPHAGOGASTRODUODENOSCOPY (EGD) WITH PROPOFOL;  Surgeon: Napoleon Form, MD;  Location: [facility];  Service: Endoscopy;  Laterality: N/A;   NASAL SEPTOPLASTY W/ TURBINOPLASTY Bilateral 12/11/2014   Procedure: NASAL SEPTOPLASTY AND BILATERAL INFERIOR TURBINATE REDUCTION;  Surgeon: Osborn Coho, MD;  [facility];  Service: ENT;  Laterality: Bilateral;   ORIF FIBULA FRACTURE Right 11/19/2013   Procedure: OPEN REDUCTION INTERNAL FIXATION (ORIF) RIGHT FIBULA FRACTURE;  Surgeon: Eulas Post, MD;  Location: [facility] OR;  Service: Orthopedics;  Laterality: Right;   WISDOM TOOTH EXTRACTION      Family Psychiatric History: Sister anxiety and depression. Notes mother has untreated mental health conditions  Family History:  Family History  Problem Relation Age of Onset   Depression Sister    Allergies Sister    Cancer Paternal Grandfather    Heart disease Paternal Grandmother    Cancer Paternal Grandmother        BREAST CANCER    Social History:  Social History   Socioeconomic History   Marital status: Married    Spouse name: Not on file   Number of children: 0   Years of education: Not on file   Highest education level: Some college, no degree  Occupational History    Comment: UNEMPOLYEED   Tobacco Use   Smoking status: Former    Current packs/day: 0.00    Types: Cigarettes    Quit date: 05/24/2010    Years since quitting: 12.8    Passive exposure: Never   Smokeless tobacco: Never   Tobacco comments:    Pt. states she used to be a social smoker  Vaping Use   Vaping status: Never Used  Substance and Sexual Activity   Alcohol use: Yes   Drug use: No   Sexual activity: Yes    Birth control/protection: None  Other Topics Concern   Not on file  Social History Narrative   Not on file   Social Determinants of Health   Financial Resource Strain: High Risk (01/06/2022)   Overall Financial Resource  Strain (CARDIA)    Difficulty of Paying Living Expenses: Hard  Food Insecurity: Food Insecurity Present (01/06/2022)   Hunger Vital Sign    Worried About Running Out of Food in the Last Year: Sometimes true    Ran Out of Food in the Last Year: Sometimes true  Transportation Needs: No Transportation Needs (01/06/2022)   PRAPARE - Administrator, Civil Service (Medical): No    Lack of Transportation (Non-Medical): No  Physical Activity: Inactive (01/06/2022)   Exercise Vital Sign    Days of Exercise per Week: 0 days    Minutes of Exercise per Session: 0 min  Stress: Stress Concern Present (01/06/2022)   [employer] of Occupational Health - Occupational Stress Questionnaire    Feeling of Stress : Very much  Social Connections: Unknown (  02/16/2022)   Received from [facility]   Social Network    Social Network: Not on file  Recent Concern: Social Connections - Socially Isolated (01/06/2022)   Social Connection and Isolation Panel [NHANES]    Frequency of Communication with Friends and Family: Once a week    Frequency of Social Gatherings with Friends and Family: Never    Attends Religious Services: Never    Database administrator or Organizations: No    Attends Banker Meetings: Never    Marital Status: Married    Allergies:  Allergies  Allergen Reactions   Bee Pollen Anaphylaxis, Swelling, Hives and Itching    Other reaction(s): eye redness   Sulfa Antibiotics Anaphylaxis, Swelling and Rash    Throat swelling/ rashes    Metabolic Disorder Labs: No results found for: "HGBA1C", "MPG" No results found for: "PROLACTIN" No results found for: "CHOL", "TRIG", "HDL", "CHOLHDL", "VLDL", "LDLCALC" Lab Results  Component Value Date   TSH 1.90 04/11/2012    Therapeutic Level Labs: No results found for: "LITHIUM" No results found for: "VALPROATE" No results found for: "CBMZ"  Current Medications: Current Outpatient Medications   Medication Sig Dispense Refill   amoxicillin-clavulanate (AUGMENTIN) 875-125 MG tablet Take 1 tablet by mouth 2 (two) times daily. 14 tablet 0   benzonatate (TESSALON) 100 MG capsule Take 1-2 capsules (100-200 mg total) by mouth 3 (three) times daily as needed. 30 capsule 0   colesevelam (WELCHOL) 625 MG tablet Take 1 tablet (625 mg total) by mouth daily with breakfast. 90 tablet 3   DULoxetine (CYMBALTA) 60 MG capsule Take 2 capsules (120 mg total) by mouth daily. 60 capsule 3   hydrOXYzine (ATARAX) 50 MG tablet Take 1 tablet (50 mg total) by mouth 3 (three) times daily. 90 tablet 3   IBU 600 MG tablet Take 600 mg by mouth 3 (three) times daily.     metFORMIN (GLUMETZA) 500 MG (MOD) 24 hr tablet Take 500 mg by mouth daily with breakfast.     metoprolol tartrate (LOPRESSOR) 25 MG tablet Take 25 mg by mouth 2 (two) times daily.     misoprostol (CYTOTEC) 200 MCG tablet Take 200 mcg by mouth 2 (two) times daily as needed (stomach ulcers).     omeprazole (PRILOSEC) 40 MG capsule Take 1 capsule by mouth once daily 90 capsule 0   pregabalin (LYRICA) 50 MG capsule Take 50 mg by mouth 2 (two) times daily.     spironolactone (ALDACTONE) 25 MG tablet Take 25 mg by mouth daily.     Turmeric (QC TUMERIC COMPLEX PO) Take 1 tablet by mouth daily.     No current facility-administered medications for this visit.     Musculoskeletal: Strength & Muscle Tone: within normal limits and telehealth visit Gait & Station: normal, telehealth visit Patient leans: N/A  Psychiatric Specialty Exam: Review of Systems  There were no vitals taken for this visit.There is no height or weight on file to calculate BMI.  General Appearance: Well Groomed  Eye Contact:  Good  Speech:  Clear and Coherent and Normal Rate  Volume:  Normal  Mood:  Anxious and Depressed  Affect:  Congruent  Thought Process:  Coherent, Goal Directed and Linear  Orientation:  Full (Time, Place, and Person)  Thought Content: WDL and Logical    Suicidal Thoughts:  No  Homicidal Thoughts:  No  Memory:  Immediate;   Good Recent;   Good Remote;   Good  Judgement:  Good  Insight:  Good  Psychomotor Activity:  Normal  Concentration:  Concentration: Fair and Attention Span: Fair  Recall:  Good  Fund of Knowledge: Good  Language: Good  Akathisia:  No  Handed:  Right  AIMS (if indicated): Not done  Assets:  Communication Skills Desire for Improvement Financial Resources/Insurance Housing Intimacy Social Support  ADL's:  Intact  Cognition: WNL  Sleep:  Fair   Screenings: GAD-7    Flowsheet Row Video Visit from 03/30/2023 in [facility] Video Visit from 01/04/2023 in [facility] Video Visit from 10/13/2022 in [facility] Video Visit from 07/28/2022 in [facility] Video Visit from 05/03/2022 in [facility]  Total GAD-7 Score 12 18 18 13 13       PHQ2-9    Flowsheet Row Video Visit from 03/30/2023 in [facility] Video Visit from 01/04/2023 in [facility] Video Visit from 10/13/2022 in [facility] Video Visit from 07/28/2022 in [facility] Video Visit from 05/03/2022 in [facility]  PHQ-2 Total Score 4 4 6 4 3   PHQ-9 Total Score 18 18 24 15 15       Flowsheet Row ED from 09/19/2022 in [facility] Department [facility] Video Visit from 02/02/2022 in [facility] Counselor from 01/06/2022 in [facility]  C-SSRS RISK CATEGORY No Risk No Risk Low Risk        Assessment and Plan: Patient continues to endorse symptoms of anxiety and depression but notes that she finds her medications are effective.  At this time she reports that her medications are readjusted.  He will continue  all medications as prescribed.  1. Major depressive disorder, recurrent episode, moderate (HCC)  Continue- hydrOXYzine (ATARAX) 50 MG tablet; Take 1 tablet (50 mg total) by mouth 3 (three) times daily.  Dispense: 90 tablet; Refill: 3 Continue- DULoxetine (CYMBALTA) 60 MG capsule; Take 2 capsules (120 mg total) by mouth daily.  Dispense: 60 capsule; Refill: 3  2. Generalized anxiety disorder  Continue- hydrOXYzine (ATARAX) 50 MG tablet; Take 1 tablet (50 mg total) by mouth 3 (three) times daily.  Dispense: 90 tablet; Refill: 3      Follow-up in 2.5 months Follow-up with therapy   Shanna Cisco, NP 03/30/2023, 12:25 PM

## 2023-05-02 ENCOUNTER — Encounter (HOSPITAL_COMMUNITY): Payer: Self-pay

## 2023-05-05 ENCOUNTER — Ambulatory Visit (HOSPITAL_COMMUNITY): Payer: Medicare HMO | Admitting: Mental Health

## 2023-05-05 ENCOUNTER — Encounter (HOSPITAL_COMMUNITY): Payer: Self-pay

## 2023-06-09 ENCOUNTER — Encounter (HOSPITAL_COMMUNITY): Payer: Self-pay | Admitting: Psychiatry

## 2023-06-09 ENCOUNTER — Telehealth (HOSPITAL_COMMUNITY): Payer: Medicare HMO | Admitting: Psychiatry

## 2023-06-09 DIAGNOSIS — F411 Generalized anxiety disorder: Secondary | ICD-10-CM | POA: Diagnosis not present

## 2023-06-09 DIAGNOSIS — F331 Major depressive disorder, recurrent, moderate: Secondary | ICD-10-CM

## 2023-06-09 MED ORDER — HYDROXYZINE HCL 50 MG PO TABS
50.0000 mg | ORAL_TABLET | Freq: Three times a day (TID) | ORAL | 3 refills | Status: DC
Start: 2023-06-09 — End: 2023-08-18

## 2023-06-09 MED ORDER — DULOXETINE HCL 60 MG PO CPEP: 120.0000 mg | ORAL_CAPSULE | Freq: Every day | ORAL | 3 refills | Status: DC

## 2023-06-09 NOTE — Progress Notes (Signed)
BH MD/PA/NP OP Progress Note Virtual Visit via Video Note  I connected with Catherine Koch on 06/09/23 at  4:00 PM EST by a video enabled telemedicine application and verified that I am speaking with the correct person using two identifiers.  Location: Patient: Home Provider: Clinic   I discussed the limitations of evaluation and management by telemedicine and the availability of in person appointments. The patient expressed understanding and agreed to proceed.  I provided 30 minutes of non-face-to-face time during this encounter.    06/09/2023 4:43 PM Catherine Koch  MRN:  [id number]  Chief Complaint: "I am having flare ups for arthritis and fibromyalgia"   HPI: 48 year old female seen today  for follow-up psychiatric evaluation. She has a history of auditory processing disorder (as a child), depression, PTSD,  and anxiety, and is currently managed on Hydroxyzine 25mg  three times daily as needed and Cymbalta 120 mg daily, and She informed writer that her medications  are effective in managing her psychiatric conditions.    Today she was well groomed, pleasant, cooperative, and engaged in conversation. She informed Clinical research associate that she is having am having flare ups for arthritis and fibromyalgia. Today she quantifies her pain as 9/10. She has been taking Cymbalta, pregablin, and tylenol to help manage her pain. Patient notes at times she is tearful. She notes that she worries about finances and reports that her husband is not the best with finances. At times she notes that financially she pays for his mistakes. At times he misuses his credit card. Now she notes that he is not physically doing well and is hospitalized for pain. Mentally she notes that he has been more moody and not taking his medications which is a stressor for her.   Patient notes that the above worsens her anxiety and depression. Today provider conducted a GAD-7 and patient scored a 14, at her last visit she scored a 12.   Provider also conducted PHQ-9 and patient scored a 18, at her last visit she scored 18.  She endorses fluctuation in appetite.  Today she denies SI/HI/VAH, mania, or paranoia.    Patient notes that she was finally able to get into [facility]. She notes that she is waiting for her results. Provider requested patient share her results when she gets them. She endorsed understanding and agreed.    At this time patient request that her medications not be adjusted.  No medication changes made today.  Patient agreeable to taking medication as prescribed.  No other concerns noted at this time.    Visit Diagnosis:    ICD-10-CM   1. Major depressive disorder, recurrent episode, moderate (HCC)  F33.1 hydrOXYzine (ATARAX) 50 MG tablet    DULoxetine (CYMBALTA) 60 MG capsule    2. Generalized anxiety disorder  F41.1 hydrOXYzine (ATARAX) 50 MG tablet         Past Psychiatric History: Auditory processing disorder (as a child), PTSD, anxiety, and depression  Past Medical History:  Past Medical History:  Diagnosis Date   ADHD    Anxiety    Arthritis    pt. states in knees   Chronic gastritis    Chronic sinus infection    Closed fracture of right distal fibula 11/19/2013   Complication of anesthesia    pt. states difficult to wake up   Depression    GERD (gastroesophageal reflux disease)    Headache    pt. states random migraines   Hemorrhoids    High cholesterol  History of bronchitis    History of degenerative disc disease    Hypertension    Ovarian failure    PTSD (post-traumatic stress disorder)    Shortness of breath    exertion from crutches   Sleep apnea    cpap   UTI (lower urinary tract infection)    Wears glasses     Past Surgical History:  Procedure Laterality Date   BIOPSY  12/10/2019   Procedure: BIOPSY;  Surgeon: Napoleon Form, MD;  Location: [facility];  Service: Endoscopy;;   CHOLECYSTECTOMY  2010   CHOLECYSTECTOMY     COLONOSCOPY WITH PROPOFOL N/A  12/10/2019   Procedure: COLONOSCOPY WITH PROPOFOL;  Surgeon: Napoleon Form, MD;  Location: [facility];  Service: Endoscopy;  Laterality: N/A;   ESOPHAGOGASTRODUODENOSCOPY     ESOPHAGOGASTRODUODENOSCOPY (EGD) WITH PROPOFOL N/A 12/10/2019   Procedure: ESOPHAGOGASTRODUODENOSCOPY (EGD) WITH PROPOFOL;  Surgeon: Napoleon Form, MD;  Location: [facility];  Service: Endoscopy;  Laterality: N/A;   NASAL SEPTOPLASTY W/ TURBINOPLASTY Bilateral 12/11/2014   Procedure: NASAL SEPTOPLASTY AND BILATERAL INFERIOR TURBINATE REDUCTION;  Surgeon: Osborn Coho, MD;  Location: [facility] OR;  Service: ENT;  Laterality: Bilateral;   ORIF FIBULA FRACTURE Right 11/19/2013   Procedure: OPEN REDUCTION INTERNAL FIXATION (ORIF) RIGHT FIBULA FRACTURE;  Surgeon: Eulas Post, MD;  Location: [facility] OR;  Service: Orthopedics;  Laterality: Right;   WISDOM TOOTH EXTRACTION      Family Psychiatric History: Sister anxiety and depression. Notes mother has untreated mental health conditions  Family History:  Family History  Problem Relation Age of Onset   Depression Sister    Allergies Sister    Cancer Paternal Grandfather    Heart disease Paternal Grandmother    Cancer Paternal Grandmother        BREAST CANCER    Social History:  Social History   Socioeconomic History   Marital status: Married    Spouse name: Not on file   Number of children: 0   Years of education: Not on file   Highest education level: Some college, no degree  Occupational History    Comment: UNEMPOLYEED   Tobacco Use   Smoking status: Former    Current packs/day: 0.00    Types: Cigarettes    Quit date: 05/24/2010    Years since quitting: 13.0    Passive exposure: Never   Smokeless tobacco: Never   Tobacco comments:    Pt. states she used to be a social smoker  Vaping Use   Vaping status: Never Used  Substance and Sexual Activity   Alcohol use: Yes   Drug use: No   Sexual activity: Yes    Birth control/protection: None  Other  Topics Concern   Not on file  Social History Narrative   Not on file   Social Drivers of Health   Financial Resource Strain: High Risk (01/06/2022)   Overall Financial Resource Strain (CARDIA)    Difficulty of Paying Living Expenses: Hard  Food Insecurity: Food Insecurity Present (01/06/2022)   Hunger Vital Sign    Worried About Running Out of Food in the Last Year: Sometimes true    Ran Out of Food in the Last Year: Sometimes true  Transportation Needs: No Transportation Needs (01/06/2022)   PRAPARE - Administrator, Civil Service (Medical): No    Lack of Transportation (Non-Medical): No  Physical Activity: Inactive (01/06/2022)   Exercise Vital Sign    Days of Exercise per Week: 0 days    Minutes  of Exercise per Session: 0 min  Stress: Stress Concern Present (01/06/2022)   [employer] of Occupational Health - Occupational Stress Questionnaire    Feeling of Stress : Very much  Social Connections: Unknown (02/16/2022)   Received from [facility]   Social Network    Social Network: Not on file  Recent Concern: Social Connections - Socially Isolated (01/06/2022)   Social Connection and Isolation Panel [NHANES]    Frequency of Communication with Friends and Family: Once a week    Frequency of Social Gatherings with Friends and Family: Never    Attends Religious Services: Never    Database administrator or Organizations: No    Attends Banker Meetings: Never    Marital Status: Married    Allergies:  Allergies  Allergen Reactions   Bee Pollen Anaphylaxis, Swelling, Hives and Itching    Other reaction(s): eye redness   Sulfa Antibiotics Anaphylaxis, Swelling and Rash    Throat swelling/ rashes    Metabolic Disorder Labs: No results found for: "HGBA1C", "MPG" No results found for: "PROLACTIN" No results found for: "CHOL", "TRIG", "HDL", "CHOLHDL", "VLDL", "LDLCALC" Lab Results  Component Value Date   TSH 1.90 04/11/2012     Therapeutic Level Labs: No results found for: "LITHIUM" No results found for: "VALPROATE" No results found for: "CBMZ"  Current Medications: Current Outpatient Medications  Medication Sig Dispense Refill   amoxicillin-clavulanate (AUGMENTIN) 875-125 MG tablet Take 1 tablet by mouth 2 (two) times daily. 14 tablet 0   benzonatate (TESSALON) 100 MG capsule Take 1-2 capsules (100-200 mg total) by mouth 3 (three) times daily as needed. 30 capsule 0   colesevelam (WELCHOL) 625 MG tablet Take 1 tablet (625 mg total) by mouth daily with breakfast. 90 tablet 3   DULoxetine (CYMBALTA) 60 MG capsule Take 2 capsules (120 mg total) by mouth daily. 60 capsule 3   hydrOXYzine (ATARAX) 50 MG tablet Take 1 tablet (50 mg total) by mouth 3 (three) times daily. 90 tablet 3   IBU 600 MG tablet Take 600 mg by mouth 3 (three) times daily.     metFORMIN (GLUMETZA) 500 MG (MOD) 24 hr tablet Take 500 mg by mouth daily with breakfast.     metoprolol tartrate (LOPRESSOR) 25 MG tablet Take 25 mg by mouth 2 (two) times daily.     misoprostol (CYTOTEC) 200 MCG tablet Take 200 mcg by mouth 2 (two) times daily as needed (stomach ulcers).     omeprazole (PRILOSEC) 40 MG capsule Take 1 capsule by mouth once daily 90 capsule 0   pregabalin (LYRICA) 50 MG capsule Take 50 mg by mouth 2 (two) times daily.     spironolactone (ALDACTONE) 25 MG tablet Take 25 mg by mouth daily.     Turmeric (QC TUMERIC COMPLEX PO) Take 1 tablet by mouth daily.     No current facility-administered medications for this visit.     Musculoskeletal: Strength & Muscle Tone: within normal limits and telehealth visit Gait & Station: normal, telehealth visit Patient leans: N/A  Psychiatric Specialty Exam: Review of Systems  There were no vitals taken for this visit.There is no height or weight on file to calculate BMI.  General Appearance: Well Groomed  Eye Contact:  Good  Speech:  Clear and Coherent and Normal Rate  Volume:  Normal  Mood:   Anxious and Depressed  Affect:  Congruent  Thought Process:  Coherent, Goal Directed and Linear  Orientation:  Full (Time, Place, and Person)  Thought Content: WDL and Logical   Suicidal Thoughts:  No  Homicidal Thoughts:  No  Memory:  Immediate;   Good Recent;   Good Remote;   Good  Judgement:  Good  Insight:  Good  Psychomotor Activity:  Normal  Concentration:  Concentration: Fair and Attention Span: Fair  Recall:  Good  Fund of Knowledge: Good  Language: Good  Akathisia:  No  Handed:  Right  AIMS (if indicated): Not done  Assets:  Communication Skills Desire for Improvement Financial Resources/Insurance Housing Intimacy Social Support  ADL's:  Intact  Cognition: WNL  Sleep:  Fair   Screenings: GAD-7    Flowsheet Row Video Visit from 06/09/2023 in [facility] Video Visit from 03/30/2023 in [facility] Video Visit from 01/04/2023 in [name] Video Visit from 10/13/2022 in [facility] Video Visit from 07/28/2022 in [facility]  Total GAD-7 Score 14 12 18 18 13       PHQ2-9    Flowsheet Row Video Visit from 06/09/2023 in [facility] Video Visit from 03/30/2023 in [facility] Video Visit from 01/04/2023 in [facility] Video Visit from 10/13/2022 in [facility] Video Visit from 07/28/2022 in [facility]  PHQ-2 Total Score 5 4 4 6 4   PHQ-9 Total Score 18 18 18 24 15       Flowsheet Row ED from 09/19/2022 in [facility] Emergency Department at [facility] Video Visit from 02/02/2022 in [facility] Counselor from 01/06/2022 in [facility]  C-SSRS RISK CATEGORY No Risk No Risk Low Risk        Assessment and Plan: Patient continues to  endorse symptoms of anxiety and depression but notes that she finds her medications are effective.  She notes that her anxiety and depression are situational. At this time she request that her medications are readjusted.  He will continue all medications as prescribed.  1. Major depressive disorder, recurrent episode, moderate (HCC)  Continue- hydrOXYzine (ATARAX) 50 MG tablet; Take 1 tablet (50 mg total) by mouth 3 (three) times daily.  Dispense: 90 tablet; Refill: 3 Continue- DULoxetine (CYMBALTA) 60 MG capsule; Take 2 capsules (120 mg total) by mouth daily.  Dispense: 60 capsule; Refill: 3  2. Generalized anxiety disorder  Continue- hydrOXYzine (ATARAX) 50 MG tablet; Take 1 tablet (50 mg total) by mouth 3 (three) times daily.  Dispense: 90 tablet; Refill: 3      Follow-up in 2.5 months Follow-up with therapy   Shanna Cisco, NP 06/09/2023, 4:43 PM

## 2023-07-06 ENCOUNTER — Ambulatory Visit (HOSPITAL_COMMUNITY): Payer: Medicare HMO | Admitting: Mental Health

## 2023-07-06 ENCOUNTER — Encounter (HOSPITAL_COMMUNITY): Payer: Self-pay

## 2023-07-10 ENCOUNTER — Other Ambulatory Visit: Payer: Self-pay | Admitting: Gastroenterology

## 2023-08-18 ENCOUNTER — Encounter (HOSPITAL_COMMUNITY): Payer: Self-pay | Admitting: Psychiatry

## 2023-08-18 ENCOUNTER — Telehealth (HOSPITAL_COMMUNITY): Payer: Medicare HMO | Admitting: Psychiatry

## 2023-08-18 DIAGNOSIS — F411 Generalized anxiety disorder: Secondary | ICD-10-CM | POA: Diagnosis not present

## 2023-08-18 DIAGNOSIS — F331 Major depressive disorder, recurrent, moderate: Secondary | ICD-10-CM | POA: Diagnosis not present

## 2023-08-18 MED ORDER — HYDROXYZINE HCL 50 MG PO TABS: 50.0000 mg | ORAL_TABLET | Freq: Three times a day (TID) | ORAL | 3 refills | Status: DC

## 2023-08-18 MED ORDER — BREXPIPRAZOLE 1 MG PO TABS
1.0000 mg | ORAL_TABLET | Freq: Every day | ORAL | 3 refills | Status: DC
Start: 2023-08-18 — End: 2023-08-23

## 2023-08-18 MED ORDER — DULOXETINE HCL 60 MG PO CPEP: 120.0000 mg | ORAL_CAPSULE | Freq: Every day | ORAL | 3 refills | Status: DC

## 2023-08-18 MED ORDER — HYDROXYZINE HCL 50 MG PO TABS
50.0000 mg | ORAL_TABLET | Freq: Four times a day (QID) | ORAL | 3 refills | Status: DC | PRN
Start: 2023-08-18 — End: 2023-10-18

## 2023-08-18 NOTE — Progress Notes (Signed)
 [facility] MD/PA/NP OP Progress Note Virtual Visit via Video Note  I connected with Catherine Koch on 08/18/23 at  1:30 PM EDT by a video enabled telemedicine application and verified that I am speaking with the correct person using two identifiers.  Location: Patient: Home Provider: Clinic   I discussed the limitations of evaluation and management by telemedicine and the availability of in person appointments. The patient expressed understanding and agreed to proceed.  I provided 30 minutes of non-face-to-face time during this encounter.    08/18/2023 2:00 PM Catherine Koch  MRN:  [id number]  Chief Complaint: "I have been in pain"   HPI: 48 year old female seen today  for follow-up psychiatric evaluation. She has a history of auditory processing disorder (as a child), depression, PTSD,  and anxiety, and is currently managed on Hydroxyzine 50mg  three times daily as needed and Cymbalta 120 mg daily. She informed Clinical research associate that her medications  are somewhat effective in managing her psychiatric conditions.    Today she was well groomed, pleasant, cooperative, and engaged in conversation. She informed Clinical research associate that she is in pain. She notes that she continues to have arthritic and fibromyalgia pain. Today she quantifies her pain as 8/10. She has not been seen by pain management as of yet. She continues to be managed on Cymbalta, pregablin, and tylenol to help manage her pain. Patient notes that her pain affects her sleep. She reports sleeping 4 to 12 hour.   Patient that she continues worries about finances and reports that her husband spends it on food and games ($2000.00). She reports that she wanted to spend her money on flooring and household items such as bed and a shower.  She reports that she is concerned because her PCP will be leaving.\  Mentally she notes that he has been all over the place and notes that the above worsens her anxiety and depression. Today provider conducted a GAD-7 and  patient scored a 18, at her last visit she scored a 14.  Provider also conducted PHQ-9 and patient scored a 20, at her last visit she scored 18.  She endorses fluctuation in appetite.  Today she denies SI/HI/VAH, mania, or paranoia.    Patient notes that she continues to wait for her results from [facility].   Today patient agreeable to start Rexulti 1 mg for a week and then increase to 2 mg. Hydroxyzine 25 mg TID increased to 50 mg four times daily. She will continue all other medications as prescribed. Potential side effects of medication and risks vs benefits of treatment vs non-treatment were explained and discussed. All questions were answered.  No other concerns noted at this time.    Visit Diagnosis:    ICD-10-CM   1. Major depressive disorder, recurrent episode, moderate (HCC)  F33.1 hydrOXYzine (ATARAX) 50 MG tablet    DULoxetine (CYMBALTA) 60 MG capsule    brexpiprazole (REXULTI) 1 MG TABS tablet    2. Generalized anxiety disorder  F41.1 hydrOXYzine (ATARAX) 50 MG tablet          Past Psychiatric History: Auditory processing disorder (as a child), PTSD, anxiety, and depression  Past Medical History:  Past Medical History:  Diagnosis Date   ADHD    Anxiety    Arthritis    pt. states in knees   Chronic gastritis    Chronic sinus infection    Closed fracture of right distal fibula 11/19/2013   Complication of anesthesia    pt. states difficult to wake  up   Depression    GERD (gastroesophageal reflux disease)    Headache    pt. states random migraines   Hemorrhoids    High cholesterol    History of bronchitis    History of degenerative disc disease    Hypertension    Ovarian failure    PTSD (post-traumatic stress disorder)    Shortness of breath    exertion from crutches   Sleep apnea    cpap   UTI (lower urinary tract infection)    Wears glasses     Past Surgical History:  Procedure Laterality Date   BIOPSY  12/10/2019   Procedure: BIOPSY;  Surgeon: Napoleon Form, MD;  Location: [facility];  Service: Endoscopy;;   CHOLECYSTECTOMY  2010   CHOLECYSTECTOMY     COLONOSCOPY WITH PROPOFOL N/A 12/10/2019   Procedure: COLONOSCOPY WITH PROPOFOL;  Surgeon: Napoleon Form, MD;  Location: [facility];  Service: Endoscopy;  Laterality: N/A;   ESOPHAGOGASTRODUODENOSCOPY     ESOPHAGOGASTRODUODENOSCOPY (EGD) WITH PROPOFOL N/A 12/10/2019   Procedure: ESOPHAGOGASTRODUODENOSCOPY (EGD) WITH PROPOFOL;  Surgeon: Napoleon Form, MD;  Location: [facility];  Service: Endoscopy;  Laterality: N/A;   NASAL SEPTOPLASTY W/ TURBINOPLASTY Bilateral 12/11/2014   Procedure: NASAL SEPTOPLASTY AND BILATERAL INFERIOR TURBINATE REDUCTION;  Surgeon: Osborn Coho, MD;  Location[facility];  Service: ENT;  Laterality: Bilateral;   ORIF FIBULA FRACTURE Right 11/19/2013   Procedure: OPEN REDUCTION INTERNAL FIXATION (ORIF) RIGHT FIBULA FRACTURE;  Surgeon: Eulas Post, MD;  Location: [facility] OR;  Service: Orthopedics;  Laterality: Right;   WISDOM TOOTH EXTRACTION      Family Psychiatric History: Sister anxiety and depression. Notes mother has untreated mental health conditions  Family History:  Family History  Problem Relation Age of Onset   Depression Sister    Allergies Sister    Cancer Paternal Grandfather    Heart disease Paternal Grandmother    Cancer Paternal Grandmother        BREAST CANCER    Social History:  Social History   Socioeconomic History   Marital status: Married    Spouse name: Not on file   Number of children: 0   Years of education: Not on file   Highest education level: Some college, no degree  Occupational History    Comment: UNEMPOLYEED   Tobacco Use   Smoking status: Former    Current packs/day: 0.00    Types: Cigarettes    Quit date: 05/24/2010    Years since quitting: 13.2    Passive exposure: Never   Smokeless tobacco: Never   Tobacco comments:    Pt. states she used to be a social smoker  Vaping Use   Vaping status: Never  Used  Substance and Sexual Activity   Alcohol use: Yes   Drug use: No   Sexual activity: Yes    Birth control/protection: None  Other Topics Concern   Not on file  Social History Narrative   Not on file   Social Drivers of Health   Financial Resource Strain: High Risk (01/06/2022)   Overall Financial Resource Strain (CARDIA)    Difficulty of Paying Living Expenses: Hard  Food Insecurity: Food Insecurity Present (01/06/2022)   Hunger Vital Sign    Worried About Running Out of Food in the Last Year: Sometimes true    Ran Out of Food in the Last Year: Sometimes true  Transportation Needs: No Transportation Needs (01/06/2022)   PRAPARE - Administrator, Civil Service (Medical):  No    Lack of Transportation (Non-Medical): No  Physical Activity: Inactive (01/06/2022)   Exercise Vital Sign    Days of Exercise per Week: 0 days    Minutes of Exercise per Session: 0 min  Stress: Stress Concern Present (01/06/2022)   [employer] of Occupational Health - Occupational Stress Questionnaire    Feeling of Stress : Very much  Social Connections: Unknown (02/16/2022)   [facility] from [facility]   Social Network    Social Network: Not on file  Recent Concern: Social Connections - Socially Isolated (01/06/2022)   Social Connection and Isolation Panel [NHANES]    Frequency of Communication with Friends and Family: Once a week    Frequency of Social Gatherings with Friends and Family: Never    Attends Religious Services: Never    Database administrator or Organizations: No    Attends Banker Meetings: Never    Marital Status: Married    Allergies:  Allergies  Allergen Reactions   Bee Pollen Anaphylaxis, Swelling, Hives and Itching    Other reaction(s): eye redness   Sulfa Antibiotics Anaphylaxis, Swelling and Rash    Throat swelling/ rashes    Metabolic Disorder Labs: No results found for: "HGBA1C", "MPG" No results found for: "PROLACTIN" No  results found for: "CHOL", "TRIG", "HDL", "CHOLHDL", "VLDL", "LDLCALC" Lab Results  Component Value Date   TSH 1.90 04/11/2012    Therapeutic Level Labs: No results found for: "LITHIUM" No results found for: "VALPROATE" No results found for: "CBMZ"  Current Medications: Current Outpatient Medications  Medication Sig Dispense Refill   brexpiprazole (REXULTI) 1 MG TABS tablet Take 1 tablet (1 mg total) by mouth daily. Take one tablet for a week and then increase to 2 tablets 60 tablet 3   amoxicillin-clavulanate (AUGMENTIN) 875-125 MG tablet Take 1 tablet by mouth 2 (two) times daily. 14 tablet 0   benzonatate (TESSALON) 100 MG capsule Take 1-2 capsules (100-200 mg total) by mouth 3 (three) times daily as needed. 30 capsule 0   colesevelam (WELCHOL) 625 MG tablet Take 1 tablet (625 mg total) by mouth daily with breakfast. 90 tablet 3   DULoxetine (CYMBALTA) 60 MG capsule Take 2 capsules (120 mg total) by mouth daily. 60 capsule 3   hydrOXYzine (ATARAX) 50 MG tablet Take 1 tablet (50 mg total) by mouth 3 (three) times daily. 90 tablet 3   IBU 600 MG tablet Take 600 mg by mouth 3 (three) times daily.     metFORMIN (GLUMETZA) 500 MG (MOD) 24 hr tablet Take 500 mg by mouth daily with breakfast.     metoprolol tartrate (LOPRESSOR) 25 MG tablet Take 25 mg by mouth 2 (two) times daily.     misoprostol (CYTOTEC) 200 MCG tablet Take 200 mcg by mouth 2 (two) times daily as needed (stomach ulcers).     omeprazole (PRILOSEC) 40 MG capsule Take 1 capsule by mouth once daily 90 capsule 0   pregabalin (LYRICA) 50 MG capsule Take 50 mg by mouth 2 (two) times daily.     spironolactone (ALDACTONE) 25 MG tablet Take 25 mg by mouth daily.     Turmeric (QC TUMERIC COMPLEX PO) Take 1 tablet by mouth daily.     No current facility-administered medications for this visit.     Musculoskeletal: Strength & Muscle Tone: within normal limits and telehealth visit Gait & Station: normal, telehealth visit Patient  leans: N/A  Psychiatric Specialty Exam: Review of Systems  There were no vitals  taken for this visit.There is no height or weight on file to calculate BMI.  General Appearance: Well Groomed  Eye Contact:  Good  Speech:  Clear and Coherent and Normal Rate  Volume:  Normal  Mood:  Anxious and Depressed  Affect:  Congruent  Thought Process:  Coherent, Goal Directed and Linear  Orientation:  Full (Time, Place, and Person)  Thought Content: WDL and Logical   Suicidal Thoughts:  No  Homicidal Thoughts:  No  Memory:  Immediate;   Good Recent;   Good Remote;   Good  Judgement:  Good  Insight:  Good  Psychomotor Activity:  Normal  Concentration:  Concentration: Fair and Attention Span: Fair  Recall:  Good  Fund of Knowledge: Good  Language: Good  Akathisia:  No  Handed:  Right  AIMS (if indicated): Not done  Assets:  Communication Skills Desire for Improvement Financial Resources/Insurance Housing Intimacy Social Support  ADL's:  Intact  Cognition: WNL  Sleep:  Fair   Screenings: GAD-7    Flowsheet Row Video Visit from 08/18/2023 in [facility] Video Visit from 06/09/2023 in [facility] Video Visit from 03/30/2023 in [facility] Video Visit from 01/04/2023 in [facility] Video Visit from 10/13/2022 in [facility]  Total GAD-7 Score 18 14 12 18 18       [id number]    Flowsheet Row Video Visit from 08/18/2023 in [facility] Video Visit from 06/09/2023 in [facility] Video Visit from 03/30/2023 in [facility] Video Visit from 01/04/2023 in [facility] Video Visit from 10/13/2022 in [facility]  PHQ-2 Total Score 4 5 4 4 6   PHQ-9 Total Score 20 18 18 18 24       Flowsheet Row ED from 09/19/2022 in [facility] Video Visit from 02/02/2022 in [facility] Counselor from 01/06/2022 in [facility]  C-SSRS RISK CATEGORY No Risk No Risk Low Risk        Assessment and Plan: Patient continues to endorse symptoms of anxiety and fluctuations in sleep.  Today patient agreeable to start Rexulti 1 mg for a week and then increase to 2 mg. Hydroxyzine 25 mg TID increased to 50 mg four times daily. She will continue all other medications as prescribed.   1. Major depressive disorder, recurrent episode, moderate (HCC)  Continue- DULoxetine (CYMBALTA) 60 MG capsule; Take 2 capsules (120 mg total) by mouth daily.  Dispense: 60 capsule; Refill: 3 Start- brexpiprazole (REXULTI) 1 MG TABS tablet; Take 1 tablet (1 mg total) by mouth daily. Take one tablet for a week and then increase to 2 tablets  Dispense: 60 tablet; Refill: 3 Increased- hydrOXYzine (ATARAX) 50 MG tablet; Take 1 tablet (50 mg total) by mouth every 6 (six) hours as needed.  Dispense: 120 tablet; Refill: 3  2. Generalized anxiety disorder  Increased- hydrOXYzine (ATARAX) 50 MG tablet; Take 1 tablet (50 mg total) by mouth every 6 (six) hours as needed.  Dispense: 120 tablet; Refill: 3    Follow-up in 2 months Follow-up with therapy   Shanna Cisco, NP 08/18/2023, 2:00 PM

## 2023-08-19 ENCOUNTER — Telehealth (HOSPITAL_COMMUNITY): Payer: Self-pay

## 2023-08-19 NOTE — Telephone Encounter (Signed)
 Pt's prior Berkley Harvey has been Started and waiting on Approval or Denial. Submitted on 08/19/2023.   For 1 tablet  Mg for one week, then 2 tablets 2mg  for remainder of Rexulti.    -JNL, CMA

## 2023-08-22 ENCOUNTER — Telehealth: Admitting: Physician Assistant

## 2023-08-22 DIAGNOSIS — B9689 Other specified bacterial agents as the cause of diseases classified elsewhere: Secondary | ICD-10-CM | POA: Diagnosis not present

## 2023-08-22 DIAGNOSIS — J019 Acute sinusitis, unspecified: Secondary | ICD-10-CM

## 2023-08-22 MED ORDER — AMOXICILLIN-POT CLAVULANATE 875-125 MG PO TABS
1.0000 | ORAL_TABLET | Freq: Two times a day (BID) | ORAL | 0 refills | Status: DC
Start: 2023-08-22 — End: 2023-12-14

## 2023-08-22 NOTE — Progress Notes (Signed)

## 2023-08-23 ENCOUNTER — Other Ambulatory Visit (HOSPITAL_COMMUNITY): Payer: Self-pay | Admitting: Psychiatry

## 2023-08-23 ENCOUNTER — Telehealth (HOSPITAL_COMMUNITY): Payer: Self-pay

## 2023-08-23 DIAGNOSIS — F331 Major depressive disorder, recurrent, moderate: Secondary | ICD-10-CM

## 2023-08-23 MED ORDER — BREXPIPRAZOLE 2 MG PO TABS
2.0000 mg | ORAL_TABLET | Freq: Every day | ORAL | 3 refills | Status: DC
Start: 2023-08-23 — End: 2023-08-25

## 2023-08-23 NOTE — Telephone Encounter (Signed)
 Hello Dr. Doyne Keel.    If you could rewrite this patient prescription for 2 mg for 30 days for Rexulti. As it was denied today for a prior Auth for having 60 tablets for 1 month.     Thanks,     JNL

## 2023-08-23 NOTE — Telephone Encounter (Signed)
Medication reordered and sent to preferred pharmacy.

## 2023-08-25 ENCOUNTER — Telehealth (HOSPITAL_COMMUNITY): Payer: Self-pay | Admitting: *Deleted

## 2023-08-25 ENCOUNTER — Other Ambulatory Visit (HOSPITAL_COMMUNITY): Payer: Self-pay | Admitting: Psychiatry

## 2023-08-25 DIAGNOSIS — F331 Major depressive disorder, recurrent, moderate: Secondary | ICD-10-CM

## 2023-08-25 MED ORDER — BREXPIPRAZOLE 2 MG PO TABS
2.0000 mg | ORAL_TABLET | Freq: Every day | ORAL | 3 refills | Status: DC
Start: 2023-08-25 — End: 2023-10-18

## 2023-08-25 NOTE — Telephone Encounter (Signed)
 Fax received for PA of Rexult 2mg . Submitted online with cover my meds. Awaiting decision.

## 2023-08-25 NOTE — Telephone Encounter (Signed)
 Medication dose adjusted to 2 mg daily.

## 2023-08-25 NOTE — Telephone Encounter (Signed)
 Fax received for PA of Rexulti 2mg . Was reviewing chart and noticed that directions for use was not changed when new order for the 2mg  was submitted. It still states Take 1 tablet for 1 week then increase to 2 tablets daily. This was on the original prescription for 1mg . Will clarify with MD before submitting PA. New prescription will need to be sent to pharmacy if patient is not taking 4mg  of Rexulti daily.

## 2023-08-25 NOTE — Telephone Encounter (Signed)
Medication adjusted and sent to preferred pharmacy

## 2023-08-26 ENCOUNTER — Encounter (HOSPITAL_COMMUNITY): Payer: Self-pay

## 2023-08-26 ENCOUNTER — Ambulatory Visit (HOSPITAL_COMMUNITY): Payer: Medicare HMO | Admitting: Mental Health

## 2023-08-26 DIAGNOSIS — F331 Major depressive disorder, recurrent, moderate: Secondary | ICD-10-CM | POA: Diagnosis not present

## 2023-08-26 DIAGNOSIS — F411 Generalized anxiety disorder: Secondary | ICD-10-CM

## 2023-08-26 NOTE — Progress Notes (Signed)
 THERAPIST PROGRESS NOTE Virtual Visit via Video Note  I connected with Catherine Koch on 08/26/23 at  9:00 AM EDT by a video enabled telemedicine application and verified that I am speaking with the correct person using two identifiers.  Location: Patient: home address on file Provider: home office   I discussed the limitations of evaluation and management by telemedicine and the availability of in person appointments. The patient expressed understanding and agreed to proceed.  I discussed the assessment and treatment plan with the patient. The patient was provided an opportunity to ask questions and all were answered. The patient agreed with the plan and demonstrated an understanding of the instructions.   The patient was advised to call back or seek an in-person evaluation if the symptoms worsen or if the condition fails to improve as anticipated.  I provided 50 minutes of non-face-to-face time during this encounter.   Catherine Koch, [facility]   Session Time: 9:03 am (   Participation Level: Active  Behavioral Response: CasualAlertDysphoric  Type of Therapy: Individual Therapy  Treatment Goals addressed: STG: Catherine Koch will increase management of depression AEB engagement in enjoyable activity daily with ability to process through stressors in balanced manner per self report within the next 90 days.  ProgressTowards Goals: Progressing  Interventions: Supportive  Summary:  Catherine Koch is a 48 y.o. female who presents with dx of major depression and generalized anxiety. Catherine Koch presents alert and oriented; mood and affect low. Speech clear and coherent at normal rate and tone. Engaged and cooperative. Notes, " I been going through a lot." Notes thoughs on marriage and contemplating desire to leave marriage. Shares, " He is selfish." Shares scared to leave husband noting she has no where else to live. Shares to have received her back pay from disability however all of the  funds are gone, sharing for husband to have spent a considerable amount on things, and spending money on his video games. Shares for friends to have told her to leave but is unsure of what she would like to do. Notes for husband to still have not showered and does support in taking out trash and shows therapist plethora of trash bags in the home. Shares for this to be stressing her out and unclear of what her next steps are. Agrees to explore options and identify pros/cons; benefits/drawbacks of ending or remaining in relationship and identifying areas of improvement she would like to see in the marriage. Denies safety concerns, agrees to updated/annual treatment plan.    Suicidal/Homicidal: Nowithout intent/plan  Therapist Response: Therapist engaged Catherine Koch in tele-therapy session. Confirmed location and ability to hold confidential session. Completed check in and assessed for current level of stressors and sxs management. Provided safe space for Catherine Koch to shares thoughts and feelings in regard to stressors. Active empathic listening. Explored areas of concern with husband and ability to communicate concerns. Provided support and encouragement; validated feelings. Encouraged to identify benefits and drawbacks and identify changes she would like to see in marriage. Encouraged ability to take time and explore her best next steps. Reviewed session and provided follow up.   Plan: Return again in  x 6 weeks.  Diagnosis: Major depressive disorder, recurrent episode, moderate (HCC)  Generalized anxiety disorder  Collaboration of Care: Other None  Patient/Guardian was advised Release of Information must be obtained prior to any record release in order to collaborate their care with an outside provider. Patient/Guardian was advised if they have not already done so to contact  the registration department to sign all necessary forms in order for [place] to release information regarding their care.   Consent:  Patient/Guardian gives verbal consent for treatment and assignment of benefits for services provided during this visit. Patient/Guardian expressed understanding and agreed to proceed.   Catherine Koch, [facility] 08/26/2023

## 2023-09-07 ENCOUNTER — Other Ambulatory Visit: Payer: Self-pay | Admitting: Medical Genetics

## 2023-09-16 ENCOUNTER — Other Ambulatory Visit (HOSPITAL_COMMUNITY): Payer: Self-pay | Admitting: Psychiatry

## 2023-09-16 DIAGNOSIS — F331 Major depressive disorder, recurrent, moderate: Secondary | ICD-10-CM

## 2023-09-20 ENCOUNTER — Ambulatory Visit (HOSPITAL_COMMUNITY): Admitting: Mental Health

## 2023-09-20 DIAGNOSIS — F331 Major depressive disorder, recurrent, moderate: Secondary | ICD-10-CM | POA: Diagnosis not present

## 2023-09-20 DIAGNOSIS — F411 Generalized anxiety disorder: Secondary | ICD-10-CM | POA: Diagnosis not present

## 2023-09-20 NOTE — Progress Notes (Signed)
 THERAPIST PROGRESS NOTE Virtual Visit via Video Note  I connected with Catherine Koch on 09/20/23 at  3:00 PM EDT by a video enabled telemedicine application and verified that I am speaking with the correct person using two identifiers.  Location: Patient: home address on file Provider: office   I discussed the limitations of evaluation and management by telemedicine and the availability of in person appointments. The patient expressed understanding and agreed to proceed.  I discussed the assessment and treatment plan with the patient. The patient was provided an opportunity to ask questions and all were answered. The patient agreed with the plan and demonstrated an understanding of the instructions.   The patient was advised to call back or seek an in-person evaluation if the symptoms worsen or if the condition fails to improve as anticipated.  I provided 36 minutes of non-face-to-face time during this encounter.   Loman Risk, [facility]   Session Time: 3:20 pm (36 minutes)  Participation Level: Active  Behavioral Response: CasualAlertEuthymic  Type of Therapy: Individual Therapy  Treatment Goals addressed:  STG: [place] will increase management of depression AEB engagement in enjoyable activity daily with ability to process through stressors in balanced manner per self report within the next 90 days.   ProgressTowards Goals: Progressing  Interventions: Supportive  Summary: Catherine Koch is a 48 y.o. female who presents with dx of major depression and generalized anxiety. Catherine Koch presents alert and oriented; mood and affect euthymic. Speech clear and coherent at normal rate and tone. Engaged. Shares to currently be in pain but shars for moods to be ok. Shares to have recently had a visit from her best friend which had her in better spirits. Shares with therapist activities they were able to engage in togedther. Notes to have had a discussion with her husband concerning  their marriage and her thoughts of separating with him sharing will work on things in which she requested; notes to be hopeful but is not sure he will be able to sustain changes that were discussed. States to have received testing with [facility] who reported or her to be on the Autism spectrum, ADHD and PTSD. Shares working t get things done around the house as she is able and engage in things she enjoys provided pain. Denies SI/HI. Progress with goals.    Suicidal/Homicidal: Nowithout intent/plan  Therapist Response: Therapist engaged Catherine Koch in tele-therapy session. Confirmed location and ability to hold confidential session. Completed check in and assessed for current level of stressors and sxs management. Provided safe space for Catherine Koch to shares thoughts and feelings in regard to stressors. Active empathic listening. Supported in processing engaging in things she enjoys with friendship and explored ability to engage in those activities on regular basis. Encouraged ongoing open and honest communication with husband and expressing concerns and areas of progress. Reviewed session.   Plan: Return again in x 5 weeks.  Diagnosis: Major depressive disorder, recurrent episode, moderate (HCC)  Generalized anxiety disorder  Collaboration of Care: Other None   Patient/Guardian was advised Release of Information must be obtained prior to any record release in order to collaborate their care with an outside provider. Patient/Guardian was advised if they have not already done so to contact the registration department to sign all necessary forms in order for us  to release information regarding their care.   Consent: Patient/Guardian gives verbal consent for treatment and assignment of benefits for services provided during this visit. Patient/Guardian expressed understanding and agreed to proceed.  Carmel Chimes [place], [facility] 09/20/2023

## 2023-09-21 ENCOUNTER — Ambulatory Visit (HOSPITAL_COMMUNITY): Admitting: Mental Health

## 2023-09-21 ENCOUNTER — Other Ambulatory Visit (HOSPITAL_COMMUNITY)
Admission: RE | Admit: 2023-09-21 | Discharge: 2023-09-21 | Disposition: A | Discharge status: Home / Self Care | Payer: Self-pay | Source: Ambulatory Visit | Attending: Medical Genetics | Admitting: Medical Genetics

## 2023-09-30 LAB — GENECONNECT MOLECULAR SCREEN: Genetic Analysis Overall Interpretation: NEGATIVE

## 2023-10-18 ENCOUNTER — Encounter (HOSPITAL_COMMUNITY): Payer: Self-pay | Admitting: Psychiatry

## 2023-10-18 ENCOUNTER — Telehealth (INDEPENDENT_AMBULATORY_CARE_PROVIDER_SITE_OTHER): Admitting: Psychiatry

## 2023-10-18 DIAGNOSIS — F411 Generalized anxiety disorder: Secondary | ICD-10-CM | POA: Diagnosis not present

## 2023-10-18 DIAGNOSIS — F331 Major depressive disorder, recurrent, moderate: Secondary | ICD-10-CM | POA: Diagnosis not present

## 2023-10-18 MED ORDER — HYDROXYZINE HCL 50 MG PO TABS: 50.0000 mg | ORAL_TABLET | Freq: Four times a day (QID) | ORAL | 3 refills | Status: DC | PRN

## 2023-10-18 MED ORDER — ARIPIPRAZOLE 5 MG PO TABS
5.0000 mg | ORAL_TABLET | Freq: Every day | ORAL | 3 refills | Status: DC
Start: 2023-10-18 — End: 2024-01-19

## 2023-10-18 MED ORDER — DULOXETINE HCL 60 MG PO CPEP
120.0000 mg | ORAL_CAPSULE | Freq: Every day | ORAL | 3 refills | Status: DC
Start: 2023-10-18 — End: 2024-01-19

## 2023-10-18 NOTE — Progress Notes (Signed)
 [facility] MD/PA/NP OP Progress Note Virtual Visit via Video Note  I connected with Catherine Koch on 10/18/23 at 11:00 AM EDT by a video enabled telemedicine application and verified that I am speaking with the correct person using two identifiers.  Location: Patient: Home Provider: Clinic   I discussed the limitations of evaluation and management by telemedicine and the availability of in person appointments. The patient expressed understanding and agreed to proceed.  I provided 30 minutes of non-face-to-face time during this encounter.    10/18/2023 11:27 AM Catherine Koch  MRN:  [id number]  Chief Complaint: "I can not afford Rexulti "   HPI: 48 year old female seen today  for follow-up psychiatric evaluation. She has a history of auditory processing disorder (as a child), depression, PTSD,  and anxiety, and is currently managed on Rexulti  2 mg daily, Hydroxyzine  50mg  four times daily as needed and Cymbalta  120 mg daily. She informed Clinical research associate that her medications  are somewhat effective in managing her psychiatric conditions.    Today she was well groomed, pleasant, cooperative, and engaged in conversation. She informed Clinical research associate that she was not able to afford Rexulti . She notes that she continues to be depressed. Recently she notes that she has been crying more. Provider suggested trailing Abilify to help manage depressive symptoms.  Patient note that continues to have issues with her marriage. She notes that she opened up to her parents and sisters about her her marital issues and they are attempting to assist. She also notes that financially she and her husband continues to struggle. She notes that he financially spends money out side of the home (paying for coworkers foods). She notes that she their accounts continues to go into the negative.  She reports that her parents have informed her that they will have a her bathroom renovated  to assist her with getting in an out of the shower as she has  pain (arthritic and fibromyalgia pain). Today she quantifies her pain as 6/10. She continues to be managed on Cymbalta , pregablin, and tylenol  to help manage her pain. Patient notes that her pain affects her sleep. She reports sleeping 4 to 12 hour.  Patient reports that she feels that her husband is suffering from depression and is untreated. She notes that the last 3 years has been the worse noting that he does not shower. She notes that he gets increasingly angry and at times she feels that he may hit the animals.   Patient notes that the above worsens her anxiety and depression. Today provider conducted a GAD-7 and patient scored a 20, at her last visit she scored a 18.  Provider also conducted PHQ-9 and patient scored a 23, at her last visit she scored 20.  She endorses fluctuation in appetite.  Today she denies SI/HI/VAH, mania, or paranoia.    At this time Rexulti  discontinued as she can not afford it. She is agreeable to start Abilify 5 mg to help manage depressive state. She will continue all other medications as prescribed. No other concerns noted at this time.    Visit Diagnosis:    ICD-10-CM   1. Major depressive disorder, recurrent episode, moderate (HCC)  F33.1 ARIPiprazole (ABILIFY) 5 MG tablet    hydrOXYzine  (ATARAX ) 50 MG tablet    DULoxetine  (CYMBALTA ) 60 MG capsule    2. Generalized anxiety disorder  F41.1 hydrOXYzine  (ATARAX ) 50 MG tablet           Past Psychiatric History: Auditory processing disorder (as a  child), PTSD, anxiety, and depression  Past Medical History:  Past Medical History:  Diagnosis Date   ADHD    Anxiety    Arthritis    pt. states in knees   Chronic gastritis    Chronic sinus infection    Closed fracture of right distal fibula 11/19/2013   Complication of anesthesia    pt. states difficult to wake up   Depression    GERD (gastroesophageal reflux disease)    Headache    pt. states random migraines   Hemorrhoids    High cholesterol     History of bronchitis    History of degenerative disc disease    Hypertension    Ovarian failure    PTSD (post-traumatic stress disorder)    Shortness of breath    exertion from crutches   Sleep apnea    cpap   UTI (lower urinary tract infection)    Wears glasses     Past Surgical History:  Procedure Laterality Date   BIOPSY  12/10/2019   Procedure: BIOPSY;  Surgeon: Sergio Dandy, MD;  Location: [facility];  Service: Endoscopy;;   CHOLECYSTECTOMY  2010   CHOLECYSTECTOMY     COLONOSCOPY WITH PROPOFOL  N/A 12/10/2019   Procedure: COLONOSCOPY WITH PROPOFOL ;  Surgeon: Sergio Dandy, MD;  Location: [facility];  Service: Endoscopy;  Laterality: N/A;   ESOPHAGOGASTRODUODENOSCOPY     ESOPHAGOGASTRODUODENOSCOPY (EGD) WITH PROPOFOL  N/A 12/10/2019   Procedure: ESOPHAGOGASTRODUODENOSCOPY (EGD) WITH PROPOFOL ;  Surgeon: Sergio Dandy, MD;  Location: [facility];  Service: Endoscopy;  Laterality: N/A;   NASAL SEPTOPLASTY W/ TURBINOPLASTY Bilateral 12/11/2014   Procedure: NASAL SEPTOPLASTY AND BILATERAL INFERIOR TURBINATE REDUCTION;  Surgeon: Ammon Bales, MD;  [facility] OR;  Service: ENT;  Laterality: Bilateral;   ORIF FIBULA FRACTURE Right 11/19/2013   Procedure: OPEN REDUCTION INTERNAL FIXATION (ORIF) RIGHT FIBULA FRACTURE;  Surgeon: Neville Barbone, MD;  Location: [facility] OR;  Service: Orthopedics;  Laterality: Right;   WISDOM TOOTH EXTRACTION      Family Psychiatric History: Sister anxiety and depression. Notes mother has untreated mental health conditions  Family History:  Family History  Problem Relation Age of Onset   Depression Sister    Allergies Sister    Cancer Paternal Grandfather    Heart disease Paternal Grandmother    Cancer Paternal Grandmother        BREAST CANCER    Social History:  Social History   Socioeconomic History   Marital status: Married    Spouse name: Not on file   Number of children: 0   Years of education: Not on file   Highest  education level: Some college, no degree  Occupational History    Comment: UNEMPOLYEED   Tobacco Use   Smoking status: Former    Current packs/day: 0.00    Types: Cigarettes    Quit date: 05/24/2010    Years since quitting: 13.4    Passive exposure: Never   Smokeless tobacco: Never   Tobacco comments:    Pt. states she used to be a social smoker  Vaping Use   Vaping status: Never Used  Substance and Sexual Activity   Alcohol use: Yes   Drug use: No   Sexual activity: Yes    Birth control/protection: None  Other Topics Concern   Not on file  Social History Narrative   Not on file   Social Drivers of Health   Financial Resource Strain: High Risk (01/06/2022)   Overall Financial Resource Strain (CARDIA)  Difficulty of Paying Living Expenses: Hard  Food Insecurity: Food Insecurity Present (01/06/2022)   Hunger Vital Sign    Worried About Running Out of Food in the Last Year: Sometimes true    Ran Out of Food in the Last Year: Sometimes true  Transportation Needs: No Transportation Needs (01/06/2022)   PRAPARE - Administrator, Civil Service (Medical): No    Lack of Transportation (Non-Medical): No  Physical Activity: Inactive (01/06/2022)   Exercise Vital Sign    Days of Exercise per Week: 0 days    Minutes of Exercise per Session: 0 min  Stress: Stress Concern Present (01/06/2022)   [employer] of Occupational Health - Occupational Stress Questionnaire    Feeling of Stress : Very much  Social Connections: Moderately Isolated (09/20/2023)   Social Connection and Isolation Panel [NHANES]    Frequency of Communication with Friends and Family: Three times a week    Frequency of Social Gatherings with Friends and Family: Never    Attends Religious Services: Never    Database administrator or Organizations: No    Attends Banker Meetings: Never    Marital Status: Married    Allergies:  Allergies  Allergen Reactions   Bee Pollen Anaphylaxis,  Swelling, Hives and Itching    Other reaction(s): eye redness   Sulfa Antibiotics Anaphylaxis, Swelling and Rash    Throat swelling/ rashes    Metabolic Disorder Labs: No results found for: "HGBA1C", "MPG" No results found for: "PROLACTIN" No results found for: "CHOL", "TRIG", "HDL", "CHOLHDL", "VLDL", "LDLCALC" Lab Results  Component Value Date   TSH 1.90 04/11/2012    Therapeutic Level Labs: No results found for: "LITHIUM" No results found for: "VALPROATE" No results found for: "CBMZ"  Current Medications: Current Outpatient Medications  Medication Sig Dispense Refill   ARIPiprazole (ABILIFY) 5 MG tablet Take 1 tablet (5 mg total) by mouth daily. 30 tablet 3   amoxicillin -clavulanate (AUGMENTIN ) 875-125 MG tablet Take 1 tablet by mouth 2 (two) times daily. 14 tablet 0   benzonatate  (TESSALON ) 100 MG capsule Take 1-2 capsules (100-200 mg total) by mouth 3 (three) times daily as needed. 30 capsule 0   colesevelam  (WELCHOL ) 625 MG tablet Take 1 tablet (625 mg total) by mouth daily with breakfast. 90 tablet 3   DULoxetine  (CYMBALTA ) 60 MG capsule Take 2 capsules (120 mg total) by mouth daily. 60 capsule 3   hydrOXYzine  (ATARAX ) 50 MG tablet Take 1 tablet (50 mg total) by mouth every 6 (six) hours as needed. 120 tablet 3   IBU 600 MG tablet Take 600 mg by mouth 3 (three) times daily.     metFORMIN (GLUMETZA) 500 MG (MOD) 24 hr tablet Take 500 mg by mouth daily with breakfast.     metoprolol tartrate (LOPRESSOR) 25 MG tablet Take 25 mg by mouth 2 (two) times daily.     misoprostol (CYTOTEC) 200 MCG tablet Take 200 mcg by mouth 2 (two) times daily as needed (stomach ulcers).     omeprazole  (PRILOSEC) 40 MG capsule Take 1 capsule by mouth once daily 90 capsule 0   pregabalin (LYRICA) 50 MG capsule Take 50 mg by mouth 2 (two) times daily.     spironolactone (ALDACTONE) 25 MG tablet Take 25 mg by mouth daily.     Turmeric (QC TUMERIC COMPLEX PO) Take 1 tablet by mouth daily.     No  current facility-administered medications for this visit.     Musculoskeletal: Strength & Muscle Tone:  within normal limits and telehealth visit Gait & Station: normal, telehealth visit Patient leans: N/A  Psychiatric Specialty Exam: Review of Systems  There were no vitals taken for this visit.There is no height or weight on file to calculate BMI.  General Appearance: Well Groomed  Eye Contact:  Good  Speech:  Clear and Coherent and Normal Rate  Volume:  Normal  Mood:  Anxious and Depressed  Affect:  Congruent  Thought Process:  Coherent, Goal Directed and Linear  Orientation:  Full (Time, Place, and Person)  Thought Content: WDL and Logical   Suicidal Thoughts:  No  Homicidal Thoughts:  No  Memory:  Immediate;   Good Recent;   Good Remote;   Good  Judgement:  Good  Insight:  Good  Psychomotor Activity:  Normal  Concentration:  Concentration: Fair and Attention Span: Fair  Recall:  Good  Fund of Knowledge: Good  Language: Good  Akathisia:  No  Handed:  Right  AIMS (if indicated): Not done  Assets:  Communication Skills Desire for Improvement Financial Resources/Insurance Housing Intimacy Social Support  ADL's:  Intact  Cognition: WNL  Sleep:  Fair   Screenings: GAD-7    Flowsheet Row Video Visit from 08/18/2023 in [facility] Video Visit from 06/09/2023 in [facility] Video Visit from 03/30/2023 in [facility] Video Visit from 01/04/2023 in [facility] Video Visit from 10/13/2022 in [facility]  Total GAD-7 Score 18 14 12 18 18       PHQ2-9    Flowsheet Row Video Visit from 08/18/2023 in [facility] Video Visit from 06/09/2023 in [facility] Video Visit from 03/30/2023 in [facility] Video Visit from 01/04/2023 in [facility] Video Visit from 10/13/2022 in [facility]  PHQ-2 Total Score 4 5 4 4 6   PHQ-9 Total Score 20 18 18 18 24       Flowsheet Row ED from 09/19/2022 in [facility] Video Visit from 02/02/2022 in [facility] Counselor from 01/06/2022 in [facility]  C-SSRS RISK CATEGORY No Risk No Risk Low Risk        Assessment and Plan: Patient continues to endorse symptoms of anxiety and fluctuations in sleep. At this time Rexulti  discontinued as she can not afford it. She is agreeable to start Abilify 5 mg to help manage depressive state. She will continue all other medications as prescribed.  1. Major depressive disorder, recurrent episode, moderate (HCC)  Start- ARIPiprazole (ABILIFY) 5 MG tablet; Take 1 tablet (5 mg total) by mouth daily.  Dispense: 30 tablet; Refill: 3 Continue- hydrOXYzine  (ATARAX ) 50 MG tablet; Take 1 tablet (50 mg total) by mouth every 6 (six) hours as needed.  Dispense: 120 tablet; Refill: 3 Continue- DULoxetine  (CYMBALTA ) 60 MG capsule; Take 2 capsules (120 mg total) by mouth daily.  Dispense: 60 capsule; Refill: 3  2. Generalized anxiety disorder  Continue- hydrOXYzine  (ATARAX ) 50 MG tablet; Take 1 tablet (50 mg total) by mouth every 6 (six) hours as needed.  Dispense: 120 tablet; Refill: 3  Follow-up in 2 months Follow-up with therapy   Arlyne Bering, NP 10/18/2023, 11:27 AM

## 2023-10-19 IMAGING — US US EXTREM LOW VENOUS*L*
1 series · 14 of 24 positions shown · non-contrast
Comparison: None Available.

CLINICAL DATA: Foot pain and anterior erythema for 1 week.

EXAM:
LEFT LOWER EXTREMITY VENOUS DOPPLER ULTRASOUND
TECHNIQUE: Gray-scale sonography with compression, as well as color and duplex
ultrasound, were performed to evaluate the deep venous system(s)
from the level of the common femoral vein through the popliteal and
proximal calf veins.

[Series 1: us venous img lower uni left (dvt) · portal-venous · 14 of 44 slices shown]
[im 1/44]
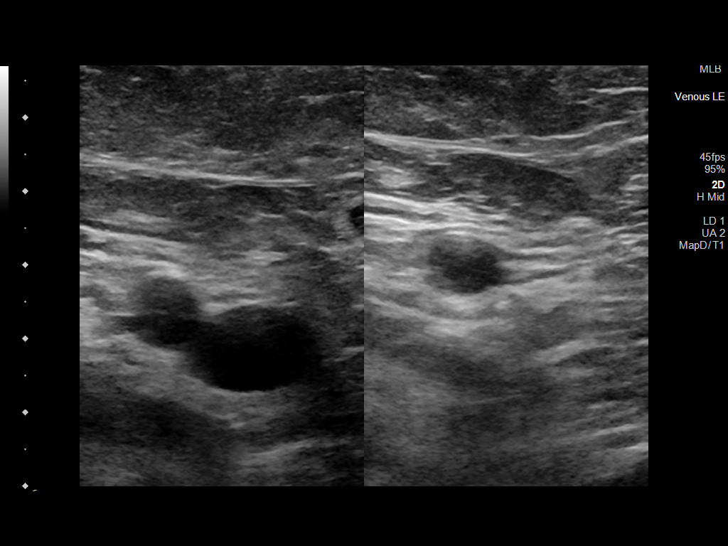
[im 4/44]
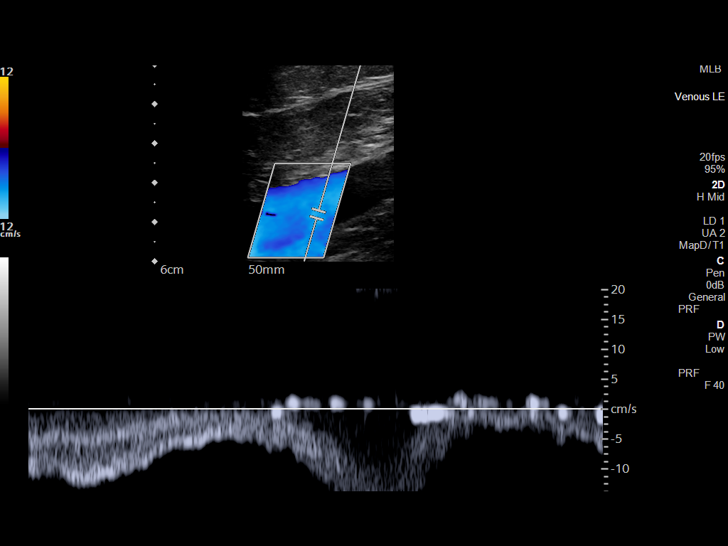
[im 8/44]
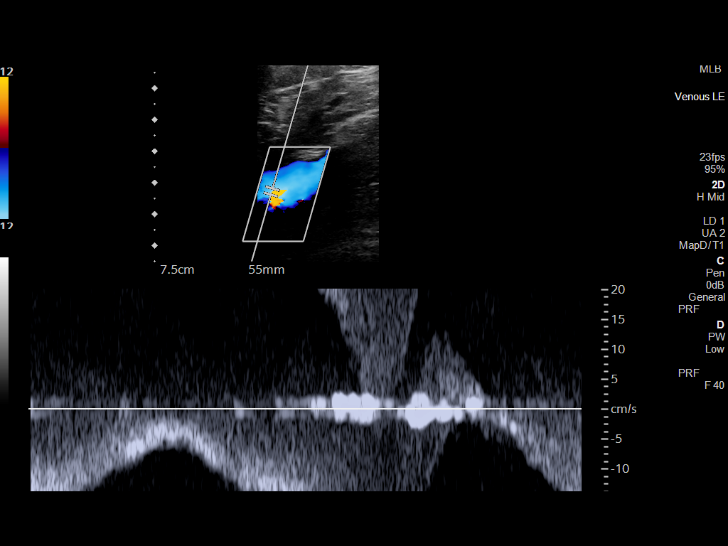
[im 12/44]
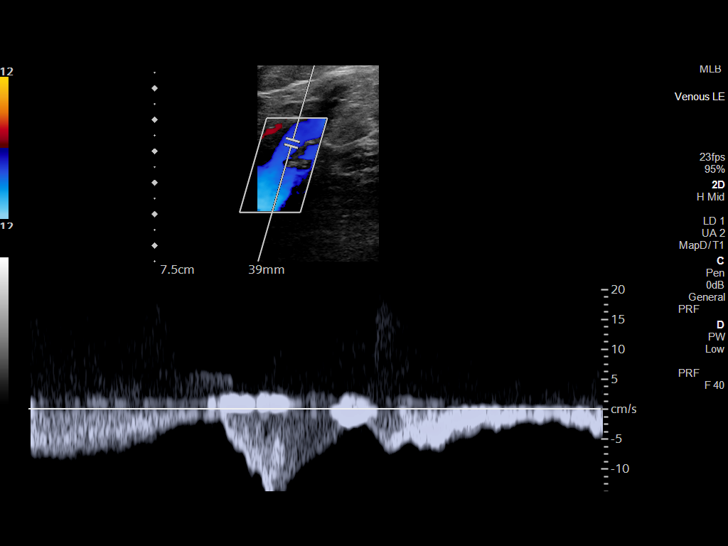
[im 14/44]
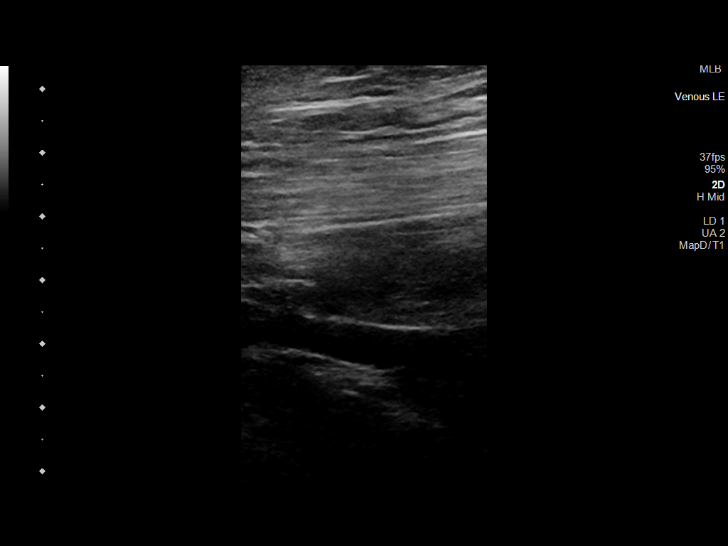
[im 17/44]
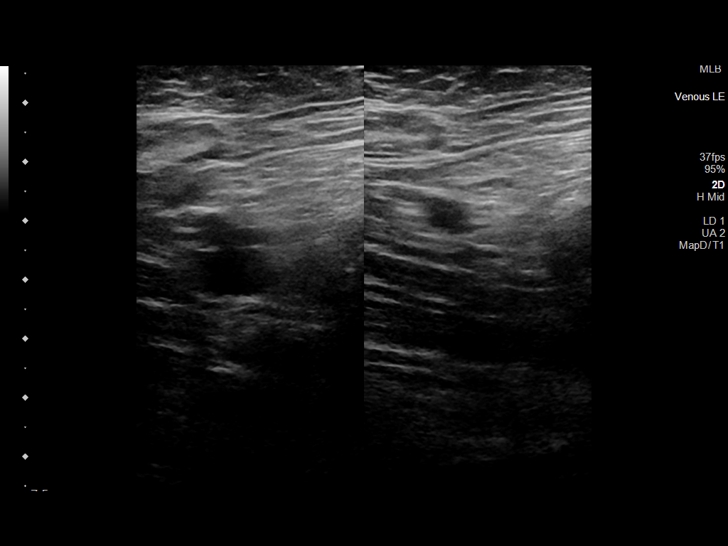
[im 21/44]
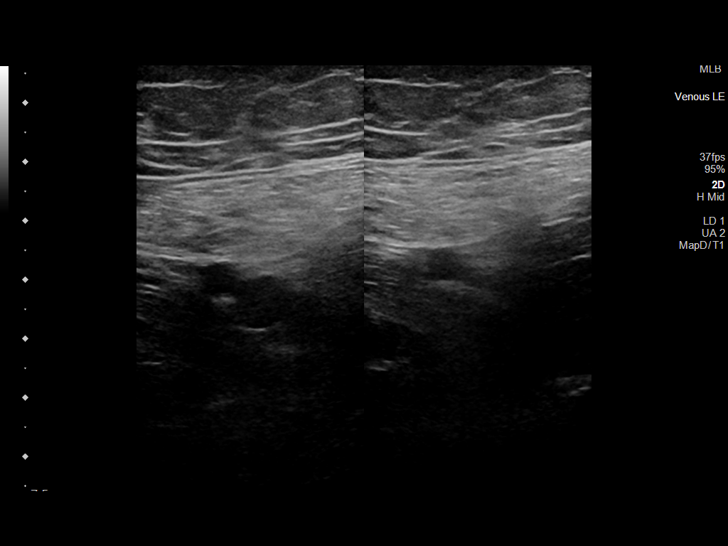
[im 23/44]
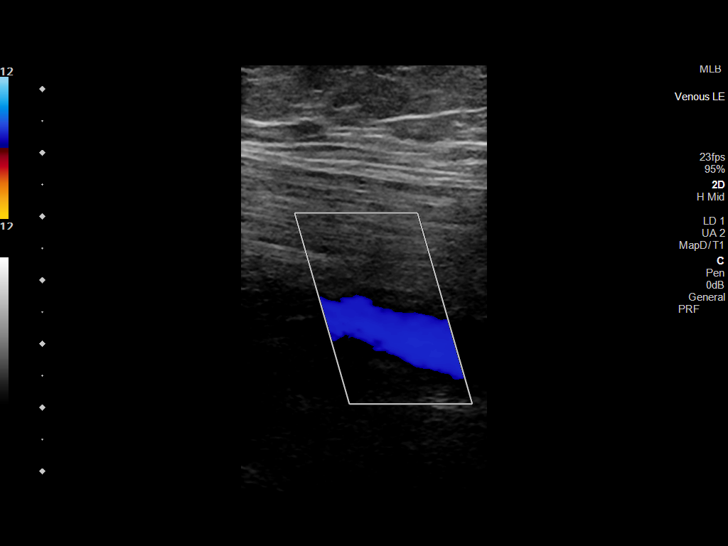
[im 27/44]
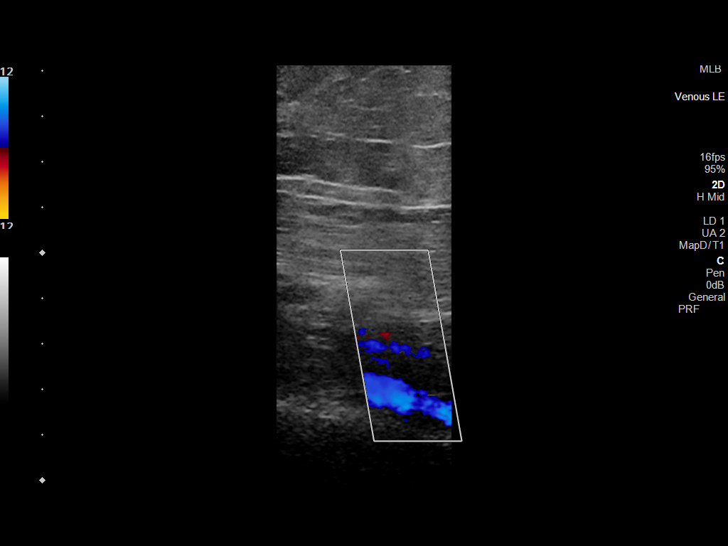
[im 30/44]
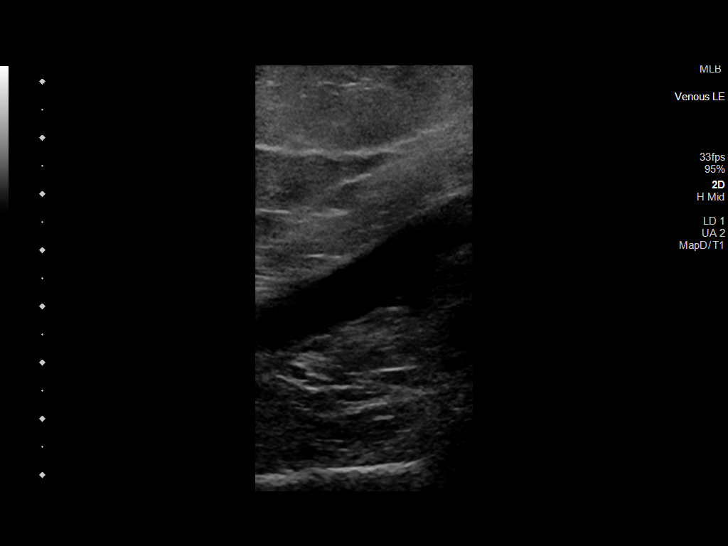
[im 34/44]
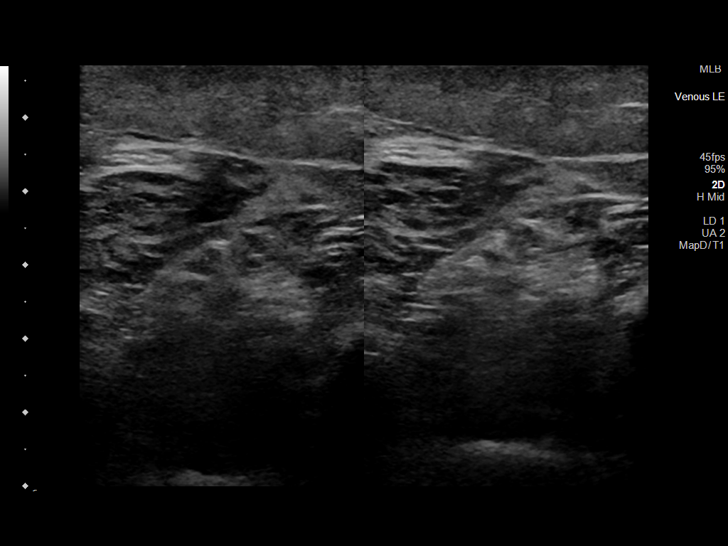
[im 36/44]
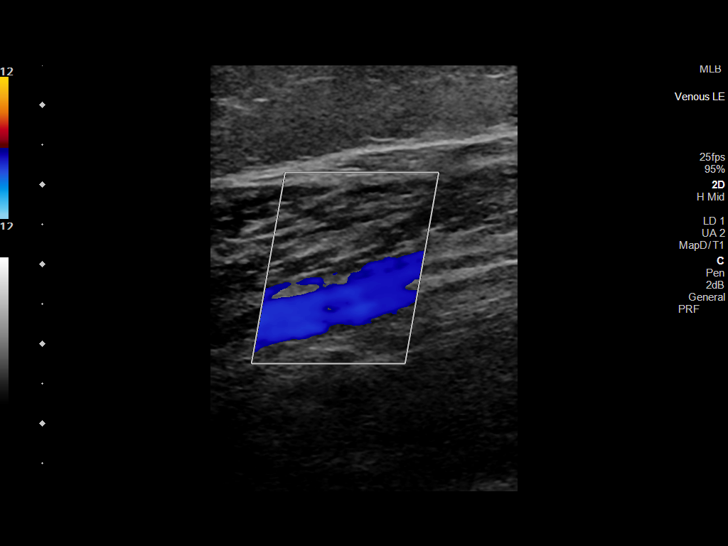
[im 40/44]
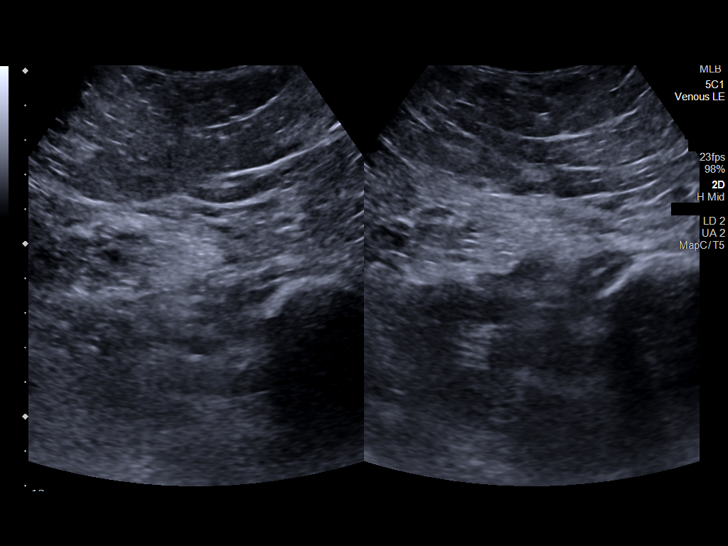
[im 44/44]
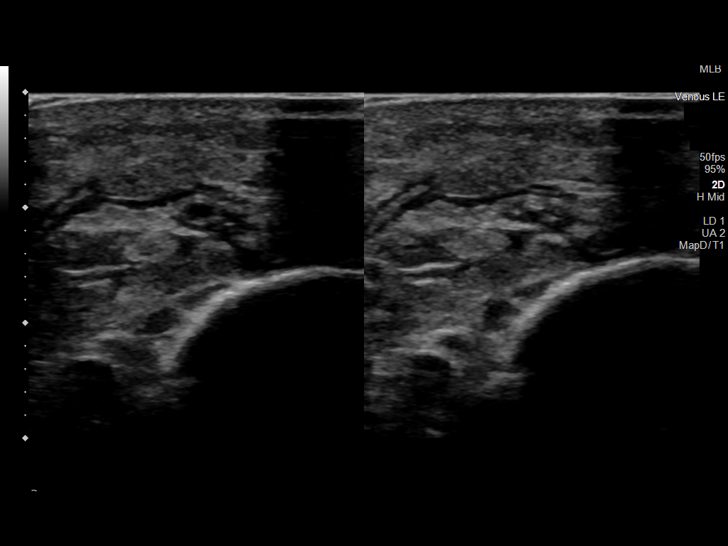

[14 of 24 positions shown; findings below may reference images not displayed]

FINDINGS: VENOUS

Normal compressibility of the common femoral, superficial femoral,
and popliteal veins, as well as the visualized calf veins.
Visualized portions of profunda femoral vein and great saphenous
vein unremarkable. No filling defects to suggest DVT on grayscale or
color Doppler imaging. Doppler waveforms show normal direction of
venous flow, normal respiratory plasticity and response to
augmentation.

Limited views of the contralateral common femoral vein are
unremarkable.

OTHER

None.

Limitations: none
IMPRESSION: No evidence of deep venous thrombosis in the left lower extremity.

## 2023-10-25 ENCOUNTER — Ambulatory Visit (HOSPITAL_COMMUNITY): Admitting: Mental Health

## 2023-10-25 DIAGNOSIS — F331 Major depressive disorder, recurrent, moderate: Secondary | ICD-10-CM

## 2023-10-26 NOTE — Progress Notes (Signed)
 Non-bill: Pt connected to tele-therapy platform at 4:20pm, upon therapist connection, pt was in riding in car stating to be leaving from another appointment. Pt understood need to reschedule due to being in moving vehicle and not in confidential space

## 2023-11-01 ENCOUNTER — Emergency Department (HOSPITAL_BASED_OUTPATIENT_CLINIC_OR_DEPARTMENT_OTHER): Admitting: Radiology

## 2023-11-01 ENCOUNTER — Emergency Department (HOSPITAL_BASED_OUTPATIENT_CLINIC_OR_DEPARTMENT_OTHER)
Admission: EM | Admit: 2023-11-01 | Discharge: 2023-11-01 | Disposition: A | Discharge status: Home / Self Care | Attending: Emergency Medicine | Admitting: Emergency Medicine

## 2023-11-01 ENCOUNTER — Other Ambulatory Visit: Payer: Self-pay

## 2023-11-01 ENCOUNTER — Encounter (HOSPITAL_BASED_OUTPATIENT_CLINIC_OR_DEPARTMENT_OTHER): Payer: Self-pay | Admitting: Emergency Medicine

## 2023-11-01 ENCOUNTER — Emergency Department (HOSPITAL_BASED_OUTPATIENT_CLINIC_OR_DEPARTMENT_OTHER)

## 2023-11-01 ENCOUNTER — Telehealth: Admitting: Physician Assistant

## 2023-11-01 DIAGNOSIS — T07XXXA Unspecified multiple injuries, initial encounter: Secondary | ICD-10-CM

## 2023-11-01 DIAGNOSIS — M25552 Pain in left hip: Secondary | ICD-10-CM | POA: Insufficient documentation

## 2023-11-01 DIAGNOSIS — Y92009 Unspecified place in unspecified non-institutional (private) residence as the place of occurrence of the external cause: Secondary | ICD-10-CM | POA: Insufficient documentation

## 2023-11-01 DIAGNOSIS — N390 Urinary tract infection, site not specified: Secondary | ICD-10-CM

## 2023-11-01 DIAGNOSIS — M79675 Pain in left toe(s): Secondary | ICD-10-CM | POA: Insufficient documentation

## 2023-11-01 DIAGNOSIS — M542 Cervicalgia: Secondary | ICD-10-CM | POA: Insufficient documentation

## 2023-11-01 DIAGNOSIS — R0781 Pleurodynia: Secondary | ICD-10-CM | POA: Insufficient documentation

## 2023-11-01 DIAGNOSIS — R519 Headache, unspecified: Secondary | ICD-10-CM | POA: Diagnosis not present

## 2023-11-01 DIAGNOSIS — W19XXXA Unspecified fall, initial encounter: Secondary | ICD-10-CM | POA: Insufficient documentation

## 2023-11-01 LAB — COMPREHENSIVE METABOLIC PANEL WITH GFR
ALT: 19 U/L (ref 0–44)
AST: 19 U/L (ref 15–41)
Albumin: 4.3 g/dL (ref 3.5–5.0)
Alkaline Phosphatase: 80 U/L (ref 38–126)
Anion gap: 15 (ref 5–15)
BUN: 12 mg/dL (ref 6–20)
CO2: 24 mmol/L (ref 22–32)
Calcium: 9.9 mg/dL (ref 8.9–10.3)
Chloride: 100 mmol/L (ref 98–111)
Creatinine, Ser: 1.06 mg/dL — ABNORMAL HIGH (ref 0.44–1.00)
GFR, Estimated: >60 mL/min (ref >60)
Glucose, Bld: 88 mg/dL (ref 70–99)
Potassium: 4.2 mmol/L (ref 3.5–5.1)
Sodium: 138 mmol/L (ref 135–145)
Total Bilirubin: 0.3 mg/dL (ref 0.0–1.2)
Total Protein: 7.7 g/dL (ref 6.5–8.1)

## 2023-11-01 LAB — CBC WITH DIFFERENTIAL/PLATELET
Abs Immature Granulocytes: 0.03 10*3/uL (ref 0.00–0.07)
Basophils Absolute: 0.1 10*3/uL (ref 0.0–0.1)
Basophils Relative: 1 %
Eosinophils Absolute: 0.2 10*3/uL (ref 0.0–0.5)
Eosinophils Relative: 2 %
HCT: 37.5 % (ref 36.0–46.0)
Hemoglobin: 12 g/dL (ref 12.0–15.0)
Immature Granulocytes: 0 %
Lymphocytes Relative: 37 %
Lymphs Abs: 3.8 10*3/uL (ref 0.7–4.0)
MCH: 29.2 pg (ref 26.0–34.0)
MCHC: 32 g/dL (ref 30.0–36.0)
MCV: 91.2 fL (ref 80.0–100.0)
Monocytes Absolute: 0.9 10*3/uL (ref 0.1–1.0)
Monocytes Relative: 9 %
Neutro Abs: 5.3 10*3/uL (ref 1.7–7.7)
Neutrophils Relative %: 51 %
Platelets: 338 10*3/uL (ref 150–400)
RBC: 4.11 MIL/uL (ref 3.87–5.11)
RDW: 14.6 % (ref 11.5–15.5)
WBC: 10.4 10*3/uL (ref 4.0–10.5)
nRBC: 0 % (ref 0.0–0.2)

## 2023-11-01 LAB — URINALYSIS, ROUTINE W REFLEX MICROSCOPIC
Bilirubin Urine: NEGATIVE
Glucose, UA: NEGATIVE mg/dL
Hgb urine dipstick: NEGATIVE
Nitrite: NEGATIVE
Protein, ur: 30 mg/dL — AB
Specific Gravity, Urine: 1.039 — ABNORMAL HIGH (ref 1.005–1.030)
pH: 5.5 (ref 5.0–8.0)

## 2023-11-01 MED ORDER — IBUPROFEN 800 MG PO TABS: 800.0000 mg | ORAL_TABLET | Freq: Four times a day (QID) | ORAL | 0 refills | Status: AC | PRN

## 2023-11-01 MED ORDER — METHOCARBAMOL 500 MG PO TABS: 500.0000 mg | ORAL_TABLET | Freq: Three times a day (TID) | ORAL | 0 refills | Status: DC | PRN

## 2023-11-01 NOTE — Progress Notes (Signed)
  Because of persistent/recurrent symptoms and need for urine culture to determine if infection still present/what antibiotics are most appropriate, I feel your condition warrants further evaluation and I recommend that you be seen in a face-to-face visit.   NOTE: There will be NO CHARGE for this E-Visit   If you are having a true medical emergency, please call 911.     For an urgent face to face visit, [facility] has multiple urgent care centers for your convenience.  Click the link below for the full list of locations and hours, walk-in wait times, appointment scheduling options and driving directions:  Urgent Care - [place], [place], [place], [place], [place], [place]  [place]     Your MyChart E-visit questionnaire answers were reviewed by a board certified advanced clinical practitioner to complete your personal care plan based on your specific symptoms.    Thank you for using e-Visits.

## 2023-11-01 NOTE — ED Provider Notes (Signed)
 [facility] AT [facility] Provider Note   CSN: [id number] Arrival date & time: 11/01/23  5638     History  Chief Complaint  Patient presents with   [place]    Catherine Koch is a 48 y.o. female.  Presents to the emergency department stating that she fell 2 nights ago and is having pain on the left side of her body.  She complains of headache, neck pain, left rib pain and pain in the toes of the left foot.       Home Medications Prior to Admission medications   Medication Sig Start Date End Date Taking? Authorizing Provider  ibuprofen  (ADVIL ) 800 MG tablet Take 1 tablet (800 mg total) by mouth every 6 (six) hours as needed for moderate pain (pain score 4-6). 11/01/23  Yes Tomasz Steeves, Marine Sia, MD  methocarbamol  (ROBAXIN ) 500 MG tablet Take 1 tablet (500 mg total) by mouth every 8 (eight) hours as needed for muscle spasms. 11/01/23  Yes Amir Fick, Marine Sia, MD  amoxicillin -clavulanate (AUGMENTIN ) 875-125 MG tablet Take 1 tablet by mouth 2 (two) times daily. 08/22/23   Angelia Kelp, PA-C  ARIPiprazole  (ABILIFY ) 5 MG tablet Take 1 tablet (5 mg total) by mouth daily. 10/18/23   Arlyne Bering, NP  benzonatate  (TESSALON ) 100 MG capsule Take 1-2 capsules (100-200 mg total) by mouth 3 (three) times daily as needed. 03/14/23   Angelia Kelp, PA-C  colesevelam  (WELCHOL ) 625 MG tablet Take 1 tablet (625 mg total) by mouth daily with breakfast. 02/09/22   Nandigam, Kavitha V, MD  DULoxetine  (CYMBALTA ) 60 MG capsule Take 2 capsules (120 mg total) by mouth daily. 10/18/23   Arlyne Bering, NP  hydrOXYzine  (ATARAX ) 50 MG tablet Take 1 tablet (50 mg total) by mouth every 6 (six) hours as needed. 10/18/23   Parsons, Brittney E, NP  metFORMIN (GLUMETZA) 500 MG (MOD) 24 hr tablet Take 500 mg by mouth daily with breakfast.    [provider]  metoprolol tartrate (LOPRESSOR) 25 MG tablet Take 25 mg by mouth 2 (two) times daily.    [provider]  misoprostol (CYTOTEC) 200 MCG tablet Take 200 mcg by mouth 2 (two) times daily as needed (stomach ulcers).    [provider]  omeprazole  (PRILOSEC) 40 MG capsule Take 1 capsule by mouth once daily 07/12/23   Nandigam, Kavitha V, MD  pregabalin (LYRICA) 50 MG capsule Take 50 mg by mouth 2 (two) times daily.    [provider]  spironolactone (ALDACTONE) 25 MG tablet Take 25 mg by mouth daily.    [provider]  Turmeric (QC TUMERIC COMPLEX PO) Take 1 tablet by mouth daily.    [provider]  ranitidine  (ZANTAC ) 150 MG tablet Take 1 tablet (150 mg total) by mouth 2 (two) times daily. 10/25/17 08/04/19  Muthersbaugh, Alisa App, PA-C      Allergies    Bee pollen and Sulfa antibiotics    Review of Systems   Review of Systems  Physical Exam Updated Vital Signs BP 108/73   Pulse 100   Temp 98.1 F (36.7 C) (Oral)   Resp 18   Ht 5\' 8"  (1.727 m)   Wt (!) 163.3 kg   SpO2 100%   BMI 54.74 kg/m  Physical Exam Vitals and nursing note reviewed.  Constitutional:      General: She is not in acute distress.    Appearance: She is well-developed.  HENT:     Head: Normocephalic and  atraumatic.     Mouth/Throat:     Mouth: Mucous membranes are moist.  Eyes:     General: Vision grossly intact. Gaze aligned appropriately.     Extraocular Movements: Extraocular movements intact.     Conjunctiva/sclera: Conjunctivae normal.  Cardiovascular:     Rate and Rhythm: Normal rate and regular rhythm.     Pulses: Normal pulses.     Heart sounds: Normal heart sounds, S1 normal and S2 normal. No murmur heard.    No friction rub. No gallop.  Pulmonary:     Effort: Pulmonary effort is normal. No respiratory distress.     Breath sounds: Normal breath sounds.  Chest:     Chest wall: Tenderness (left) present. No deformity.  Abdominal:     General: Bowel sounds are normal.     Palpations: Abdomen is soft.     Tenderness: There is no abdominal tenderness.  There is no guarding or rebound.     Hernia: No hernia is present.  Musculoskeletal:        General: No swelling.     Cervical back: Full passive range of motion without pain, normal range of motion and neck supple. No spinous process tenderness or muscular tenderness. Normal range of motion.     Right lower leg: No edema.     Left lower leg: No edema.     Left foot: Swelling (5th toe) present.  Skin:    General: Skin is warm and dry.     Capillary Refill: Capillary refill takes less than 2 seconds.     Findings: No ecchymosis, erythema, rash or wound.  Neurological:     General: No focal deficit present.     Mental Status: She is alert and oriented to person, place, and time.     GCS: GCS eye subscore is 4. GCS verbal subscore is 5. GCS motor subscore is 6.     Cranial Nerves: Cranial nerves 2-12 are intact.     Sensory: Sensation is intact.     Motor: Motor function is intact.     Coordination: Coordination is intact.  Psychiatric:        Attention and Perception: Attention normal.        Mood and Affect: Mood normal.        Speech: Speech normal.        Behavior: Behavior normal.     ED Results / Procedures / Treatments   Labs (all labs ordered are listed, but only abnormal results are displayed) Labs Reviewed  COMPREHENSIVE METABOLIC PANEL WITH GFR - Abnormal; Notable for the following components:      Result Value   Creatinine, Ser 1.06 (*)    All other components within normal limits  URINALYSIS, ROUTINE W REFLEX MICROSCOPIC - Abnormal; Notable for the following components:   APPearance HAZY (*)    Specific Gravity, Urine 1.039 (*)    Ketones, ur TRACE (*)    Protein, ur 30 (*)    Leukocytes,Ua LARGE (*)    All other components within normal limits  CBC WITH DIFFERENTIAL/PLATELET    EKG None  Radiology CT HEAD WO CONTRAST ( ) Result Date: 11/01/2023 CLINICAL DATA:  Moderate to severe head trauma with headache and neck pain. Fell Saturday. EXAM: CT HEAD  WITHOUT CONTRAST CT CERVICAL SPINE WITHOUT CONTRAST TECHNIQUE: Multidetector CT imaging of the head and cervical spine was performed following the standard protocol without intravenous contrast. Multiplanar CT image reconstructions of the cervical spine were also generated. RADIATION DOSE REDUCTION:  This exam was performed according to the departmental dose-optimization program which includes automated exposure control, adjustment of the mA and/or kV according to patient size and/or use of iterative reconstruction technique. COMPARISON:  MRI brain 09/26/2020. No prior cervical spine films or cross-sectional studies. FINDINGS: CT HEAD FINDINGS Brain: No evidence of acute infarction, hemorrhage, hydrocephalus, extra-axial collection or mass lesion/mass effect. Benign dural calcifications are scattered along the falx, as before. Vascular: No hyperdense vessel or unexpected calcification. Skull: Negative for fractures or focal lesions. No visible scalp hematoma. Sinuses/Orbits: Clear sinuses and mastoid air cells. Midline nasal septum. Negative orbits. Other: None. CT CERVICAL SPINE FINDINGS Alignment: There is a reversed cervical lordosis centered at C5, without AP listhesis. There is narrowing and spurring at the anterior atlantodental joint. Skull base and vertebrae: No acute fracture is evident. No primary bone lesion or focal pathologic process. Soft tissues and spinal canal: No prevertebral fluid or swelling. No visible canal hematoma. Both palatine tonsils are enlarged abutting the uvula. No abscess is seen. There is mild thickening in the pharyngeal membranes. Correlate clinically for pharyngitis/tonsillitis. There is advanced fatty replacement of the parotid glands, moderate fatty replacement of the submandibular glands partially visible. Normal epiglottis. Disc levels: The discs are degenerated at all cervical levels except for C2-3 which is relatively normal in height. The greatest disc height loss is at C5-6  and C6-7, where there are bidirectional marginal osteophytes and spondylotic cord compression due to dorsal disc osteophyte complexes, greatest at C6-7. No other levels show further significant soft tissue or bony encroachment on the thecal sac. There is mild cervical facet joint spurring but the foramina appear patent. Upper chest: Negative. Other: None. IMPRESSION: 1. No acute intracranial CT findings or depressed skull fractures. 2. Reversed cervical lordosis without evidence of fractures. 3. 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 4. Enlarged palatine tonsils abutting the uvula. Mild thickening in the pharyngeal membranes. No abscess is seen. 5. Fatty replacement of the parotid and submandibular glands. Electronically Signed   By: Denman Fischer M.D.   On: 11/01/2023 05:38   CT CERVICAL SPINE WO CONTRAST Result Date: 11/01/2023 CLINICAL DATA:  Moderate to severe head trauma with headache and neck pain. Fell Saturday. EXAM: CT HEAD WITHOUT CONTRAST CT CERVICAL SPINE WITHOUT CONTRAST TECHNIQUE: Multidetector CT imaging of the head and cervical spine was performed following the standard protocol without intravenous contrast. Multiplanar CT image reconstructions of the cervical spine were also generated. RADIATION DOSE REDUCTION: This exam was performed according to the departmental dose-optimization program which includes automated exposure control, adjustment of the mA and/or kV according to patient size and/or use of iterative reconstruction technique. COMPARISON:  MRI brain 09/26/2020. No prior cervical spine films or cross-sectional studies. FINDINGS: CT HEAD FINDINGS Brain: No evidence of acute infarction, hemorrhage, hydrocephalus, extra-axial collection or mass lesion/mass effect. Benign dural calcifications are scattered along the falx, as before. Vascular: No hyperdense vessel or unexpected calcification. Skull: Negative for  fractures or focal lesions. No visible scalp hematoma. Sinuses/Orbits: Clear sinuses and mastoid air cells. Midline nasal septum. Negative orbits. Other: None. CT CERVICAL SPINE FINDINGS Alignment: There is a reversed cervical lordosis centered at C5, without AP listhesis. There is narrowing and spurring at the anterior atlantodental joint. Skull base and vertebrae: No acute fracture is evident. No primary bone lesion or focal pathologic process. Soft tissues and spinal canal: No prevertebral fluid or swelling. No visible canal hematoma. Both palatine tonsils are  enlarged abutting the uvula. No abscess is seen. There is mild thickening in the pharyngeal membranes. Correlate clinically for pharyngitis/tonsillitis. There is advanced fatty replacement of the parotid glands, moderate fatty replacement of the submandibular glands partially visible. Normal epiglottis. Disc levels: The discs are degenerated at all cervical levels except for C2-3 which is relatively normal in height. The greatest disc height loss is at C5-6 and C6-7, where there are bidirectional marginal osteophytes and spondylotic cord compression due to dorsal disc osteophyte complexes, greatest at C6-7. No other levels show further significant soft tissue or bony encroachment on the thecal sac. There is mild cervical facet joint spurring but the foramina appear patent. Upper chest: Negative. Other: None. IMPRESSION: 1. No acute intracranial CT findings or depressed skull fractures. 2. Reversed cervical lordosis without evidence of fractures. 3. 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 4. Enlarged palatine tonsils abutting the uvula. Mild thickening in the pharyngeal membranes. No abscess is seen. 5. Fatty replacement of the parotid and submandibular glands. Electronically Signed   By: Denman Fischer M.D.   On: 11/01/2023 05:38   DG Ribs Unilateral W/Chest Left Result Date:  11/01/2023 CLINICAL DATA:  Marvell Slider 2 days ago with worsening left-sided rib cage pain, worsening left foot pain. EXAM: LEFT RIBS AND CHEST - 3+ VIEW; LEFT FOOT - COMPLETE 3+ VIEW COMPARISON:  PA and lateral chest 10/25/2017, left foot series 05/23/2020. FINDINGS: Five views total with PA chest and four views with attention to the anterior and posterior ribs: No fracture or other bone lesions are seen involving the ribs. There is no evidence of pneumothorax or pleural effusion. Both lungs are clear. Heart size and mediastinal contours are within normal limits. Three views left foot: There is mild generalized edema. Normal bone mineralization. No evidence of fracture or dislocation. There are small plantar and dorsal calcaneal spurs. Arthritic changes are not seen. IMPRESSION: 1. No evidence of rib fractures or acute chest disease. 2. Mild generalized edema in the left foot. No evidence of fracture or dislocation. 3. Small plantar and dorsal calcaneal spurs. Electronically Signed   By: Denman Fischer M.D.   On: 11/01/2023 05:19   DG Foot Complete Left Result Date: 11/01/2023 CLINICAL DATA:  Marvell Slider 2 days ago with worsening left-sided rib cage pain, worsening left foot pain. EXAM: LEFT RIBS AND CHEST - 3+ VIEW; LEFT FOOT - COMPLETE 3+ VIEW COMPARISON:  PA and lateral chest 10/25/2017, left foot series 05/23/2020. FINDINGS: Five views total with PA chest and four views with attention to the anterior and posterior ribs: No fracture or other bone lesions are seen involving the ribs. There is no evidence of pneumothorax or pleural effusion. Both lungs are clear. Heart size and mediastinal contours are within normal limits. Three views left foot: There is mild generalized edema. Normal bone mineralization. No evidence of fracture or dislocation. There are small plantar and dorsal calcaneal spurs. Arthritic changes are not seen. IMPRESSION: 1. No evidence of rib fractures or acute chest disease. 2. Mild generalized edema in  the left foot. No evidence of fracture or dislocation. 3. Small plantar and dorsal calcaneal spurs. Electronically Signed   By: Denman Fischer M.D.   On: 11/01/2023 05:19    Procedures Procedures    Medications Ordered in ED Medications - No data to display  ED Course/ Medical Decision Making/ A&P  Medical Decision Making Amount and/or Complexity of Data Reviewed Labs: ordered. Radiology: ordered.   Patient presents to the emergency department for evaluation of injuries from a fall that occurred 2 days ago.  She complains of headache and neck pain.  CT head and cervical spine are negative.  She is complaining of pain on the left side of her body as well.  She specifically complains of left chest wall rib area pain.  X-ray negative.  She complained of left hip pain but she is ambulatory, range of motion without difficulty, no concern for hip fracture.  Patient with oozing to the lateral toes on the left foot, x-ray foot negative.        Final Clinical Impression(s) / ED Diagnoses Final diagnoses:  Multiple contusions    Rx / DC Orders ED Discharge Orders          Ordered    ibuprofen  (ADVIL ) 800 MG tablet  Every 6 hours PRN        11/01/23 0553    methocarbamol  (ROBAXIN ) 500 MG tablet  Every 8 hours PRN        11/01/23 0553              Ballard Bongo, MD 11/01/23 [phone]

## 2023-11-01 NOTE — ED Triage Notes (Signed)
 Pt coming in from home after having a fall Saturday and having worsening pain throughout the left body, from head to foot.

## 2023-11-07 ENCOUNTER — Ambulatory Visit (INDEPENDENT_AMBULATORY_CARE_PROVIDER_SITE_OTHER): Admitting: Mental Health

## 2023-11-07 DIAGNOSIS — F331 Major depressive disorder, recurrent, moderate: Secondary | ICD-10-CM | POA: Diagnosis not present

## 2023-11-07 DIAGNOSIS — F411 Generalized anxiety disorder: Secondary | ICD-10-CM

## 2023-11-07 NOTE — Progress Notes (Unsigned)
   THERAPIST PROGRESS NOTE Virtual Visit via Video Note  I connected with Catherine Koch on 11/07/23 at  3:00 PM EDT by a video enabled telemedicine application and verified that I am speaking with the correct person using two identifiers.  Location: Patient: home address on file Provider: home office   I discussed the limitations of evaluation and management by telemedicine and the availability of in person appointments. The patient expressed understanding and agreed to proceed.  I discussed the assessment and treatment plan with the patient. The patient was provided an opportunity to ask questions and all were answered. The patient agreed with the plan and demonstrated an understanding of the instructions.   The patient was advised to call back or seek an in-person evaluation if the symptoms worsen or if the condition fails to improve as anticipated.  I provided 50 minutes of non-face-to-face time during this encounter.   Loman Risk, [facility]   Session Time: 3:05 pm (   Participation Level: Active  Behavioral Response: DisheveledAlertDysphoric  Type of Therapy: Individual Therapy  Treatment Goals addressed:  STG: Jackson will increase management of depression AEB engagement in enjoyable activity daily with ability to process through stressors in balanced manner per self report within the next 90 days.   ProgressTowards Goals: Progressing  Interventions: Supportive  Summary: Catherine Koch is a 48 y.o. female who presents with dx of major depression and generalized anxiety. Mckynzi presents alert and oriented; mood and affect euthymic. Speech clear and coherent at normal rate and tone. Engaged. Shares to currently be in pain but shars for moods to be ok. Shares to have recently had a visit from her best friend which had her in better spirits. Shares with     Suicidal/Homicidal: Nowithout intent/plan  Therapist Response:  Therapist engaged Lashanna in tele-therapy  session. Confirmed location and ability to hold confidential session. Completed check in and assessed for current level of stressors and sxs management. Provided safe space for Catherine Koch to shares thoughts and feelings in regard to stressors.   Plan: Return again in  x 7 weeks.  Diagnosis: Major depressive disorder, recurrent episode, moderate (HCC)  Generalized anxiety disorder  Collaboration of Care: Other None  Patient/Guardian was advised Release of Information must be obtained prior to any record release in order to collaborate their care with an outside provider. Patient/Guardian was advised if they have not already done so to contact the registration department to sign all necessary forms in order for us  to release information regarding their care.   Consent: Patient/Guardian gives verbal consent for treatment and assignment of benefits for services provided during this visit. Patient/Guardian expressed understanding and agreed to proceed.   Carmel Chimes [place], [facility] 11/07/2023

## 2023-12-14 ENCOUNTER — Telehealth: Admitting: Physician Assistant

## 2023-12-14 DIAGNOSIS — J069 Acute upper respiratory infection, unspecified: Secondary | ICD-10-CM

## 2023-12-14 MED ORDER — FLUTICASONE PROPIONATE 50 MCG/ACT NA SUSP: 2.0000 | Freq: Every day | NASAL | 0 refills | Status: DC

## 2023-12-14 MED ORDER — BENZONATATE 100 MG PO CAPS: 100.0000 mg | ORAL_CAPSULE | Freq: Three times a day (TID) | ORAL | 0 refills | Status: AC | PRN

## 2023-12-14 NOTE — Progress Notes (Signed)

## 2023-12-17 ENCOUNTER — Emergency Department (HOSPITAL_BASED_OUTPATIENT_CLINIC_OR_DEPARTMENT_OTHER): Admission: EM | Admit: 2023-12-17 | Discharge: 2023-12-17 | Disposition: A | Discharge status: Home / Self Care

## 2023-12-17 ENCOUNTER — Encounter (HOSPITAL_BASED_OUTPATIENT_CLINIC_OR_DEPARTMENT_OTHER): Payer: Self-pay | Admitting: Emergency Medicine

## 2023-12-17 DIAGNOSIS — E669 Obesity, unspecified: Secondary | ICD-10-CM | POA: Insufficient documentation

## 2023-12-17 DIAGNOSIS — M797 Fibromyalgia: Secondary | ICD-10-CM | POA: Diagnosis not present

## 2023-12-17 DIAGNOSIS — R0981 Nasal congestion: Secondary | ICD-10-CM | POA: Insufficient documentation

## 2023-12-17 DIAGNOSIS — J039 Acute tonsillitis, unspecified: Secondary | ICD-10-CM

## 2023-12-17 DIAGNOSIS — G4730 Sleep apnea, unspecified: Secondary | ICD-10-CM | POA: Insufficient documentation

## 2023-12-17 DIAGNOSIS — J029 Acute pharyngitis, unspecified: Secondary | ICD-10-CM | POA: Diagnosis present

## 2023-12-17 DIAGNOSIS — E88819 Insulin resistance, unspecified: Secondary | ICD-10-CM | POA: Diagnosis not present

## 2023-12-17 LAB — RESP PANEL BY RT-PCR (RSV, FLU A&B, COVID)  RVPGX2
Influenza A by PCR: NEGATIVE
Influenza B by PCR: NEGATIVE
Resp Syncytial Virus by PCR: NEGATIVE
SARS Coronavirus 2 by RT PCR: NEGATIVE

## 2023-12-17 LAB — GROUP A STREP BY PCR: Group A Strep by PCR: NOT DETECTED

## 2023-12-17 LAB — MONONUCLEOSIS SCREEN: Mono Screen: NEGATIVE

## 2023-12-17 MED ORDER — LIDOCAINE VISCOUS HCL 2 % MT SOLN
15.0000 mL | Freq: Once | OROMUCOSAL | Status: AC
  Administered 2023-12-17: 15 mL via OROMUCOSAL
  Filled 2023-12-17: qty 15

## 2023-12-17 MED ORDER — SODIUM CHLORIDE 0.9 % IV BOLUS
1000.0000 mL | Freq: Once | INTRAVENOUS | Status: AC
  Administered 2023-12-17: 1000 mL via INTRAVENOUS

## 2023-12-17 MED ORDER — METHYLPREDNISOLONE 4 MG PO TBPK: ORAL_TABLET | ORAL | 0 refills | Status: DC

## 2023-12-17 MED ORDER — KETOROLAC TROMETHAMINE 30 MG/ML IJ SOLN
30.0000 mg | Freq: Once | INTRAMUSCULAR | Status: AC
  Administered 2023-12-17: 30 mg via INTRAVENOUS
  Filled 2023-12-17: qty 1

## 2023-12-17 MED ORDER — LIDOCAINE VISCOUS HCL 2 % MT SOLN
10.0000 mL | OROMUCOSAL | 0 refills | Status: AC | PRN
Start: 2023-12-17 — End: ?

## 2023-12-17 MED ORDER — DEXAMETHASONE SODIUM PHOSPHATE 10 MG/ML IJ SOLN
10.0000 mg | Freq: Once | INTRAMUSCULAR | Status: AC
  Administered 2023-12-17: 10 mg via INTRAVENOUS
  Filled 2023-12-17: qty 1

## 2023-12-17 MED ORDER — NAPROXEN 500 MG PO TABS: 500.0000 mg | ORAL_TABLET | Freq: Two times a day (BID) | ORAL | 0 refills | Status: AC

## 2023-12-17 NOTE — ED Triage Notes (Signed)
 Pt arrived POV with c/o sore throat, cough for about a week. States she has had fevers at home. 99.6 currently. No medications taken today

## 2023-12-17 NOTE — ED Provider Notes (Signed)
 [place] EMERGENCY DEPARTMENT AT [facility] Provider Note   CSN: [id number] Arrival date & time: 12/17/23  1026     Patient presents with: Sore Throat   Catherine Koch is a 48 y.o. female.  With past medical history of sleep apnea, obesity, fibromyalgia and insulin resistance who presents emergency department achieving complaint of sore throat.  Patient reports she had an online telehealth visit several days ago and was diagnosed with URI was treated with supportive over-the-counter medications for congestion.  She reports that she only had a mild sore throat and some nasal congestion at the time but has been progressively worsening.  Her family member at bedside had a sore throat last week as well.  Patient has been having difficulty tolerated food and fluids.  She denies voice change fever or chills.    Sore Throat       Prior to Admission medications   Medication Sig Start Date End Date Taking? Authorizing Provider  ARIPiprazole  (ABILIFY ) 5 MG tablet Take 1 tablet (5 mg total) by mouth daily. 10/18/23   Harl Zane BRAVO, NP  benzonatate  (TESSALON ) 100 MG capsule Take 1-2 capsules (100-200 mg total) by mouth 3 (three) times daily as needed. 12/14/23   Vivienne Delon HERO, PA-C  colesevelam  (WELCHOL ) 625 MG tablet Take 1 tablet (625 mg total) by mouth daily with breakfast. 02/09/22   Nandigam, Kavitha V, MD  DULoxetine  (CYMBALTA ) 60 MG capsule Take 2 capsules (120 mg total) by mouth daily. 10/18/23   Harl Zane BRAVO, NP  fluticasone  (FLONASE ) 50 MCG/ACT nasal spray Place 2 sprays into both nostrils daily. 12/14/23   Burnette, Jennifer M, PA-C  hydrOXYzine  (ATARAX ) 50 MG tablet Take 1 tablet (50 mg total) by mouth every 6 (six) hours as needed. 10/18/23   Harl Zane BRAVO, NP  ibuprofen  (ADVIL ) 800 MG tablet Take 1 tablet (800 mg total) by mouth every 6 (six) hours as needed for moderate pain (pain score 4-6). 11/01/23   Haze Lonni PARAS, MD  metFORMIN (GLUMETZA)  500 MG (MOD) 24 hr tablet Take 500 mg by mouth daily with breakfast.    [provider]  metoprolol tartrate (LOPRESSOR) 25 MG tablet Take 25 mg by mouth 2 (two) times daily.    [provider]  misoprostol (CYTOTEC) 200 MCG tablet Take 200 mcg by mouth 2 (two) times daily as needed (stomach ulcers).    [provider]  omeprazole  (PRILOSEC) 40 MG capsule Take 1 capsule by mouth once daily 07/12/23   Nandigam, Kavitha V, MD  pregabalin (LYRICA) 50 MG capsule Take 50 mg by mouth 2 (two) times daily.    [provider]  spironolactone (ALDACTONE) 25 MG tablet Take 25 mg by mouth daily.    [provider]  Turmeric (QC TUMERIC COMPLEX PO) Take 1 tablet by mouth daily.    [provider]  ranitidine  (ZANTAC ) 150 MG tablet Take 1 tablet (150 mg total) by mouth 2 (two) times daily. 10/25/17 08/04/19  Muthersbaugh, Chiquita, PA-C    Allergies: Bee pollen and Sulfa antibiotics    Review of Systems  Updated Vital Signs BP (!) 157/107 (BP Location: Left Leg)   Pulse (!) 124   Temp 99.6 F (37.6 C) (Oral)   Resp 18   SpO2 97%   Physical Exam Vitals and nursing note reviewed.  Constitutional:      General: She is not in acute distress.    Appearance: She is well-developed. She is not diaphoretic.  HENT:  Head: Normocephalic and atraumatic.     Right Ear: External ear normal.     Left Ear: External ear normal.     Nose: Nose normal.     Mouth/Throat:     Mouth: Mucous membranes are moist.     Pharynx: Uvula midline.     Tonsils: Tonsillar exudate present. No tonsillar abscesses. 2+ on the right. 2+ on the left.     Comments: Bilateral left greater than right tender tonsillar lymphadenopathy Normal phonation Eyes:     General: No scleral icterus.    Conjunctiva/sclera: Conjunctivae normal.  Cardiovascular:     Rate and Rhythm: Normal rate and regular rhythm.     Heart sounds: Normal heart sounds. No murmur heard.    No friction rub. No  gallop.  Pulmonary:     Effort: Pulmonary effort is normal. No respiratory distress.     Breath sounds: Normal breath sounds.  Abdominal:     General: Bowel sounds are normal. There is no distension.     Palpations: Abdomen is soft. There is no mass.     Tenderness: There is no abdominal tenderness. There is no guarding.  Musculoskeletal:     Cervical back: Normal range of motion.  Skin:    General: Skin is warm and dry.  Neurological:     Mental Status: She is alert and oriented to person, place, and time.  Psychiatric:        Behavior: Behavior normal.     (all labs ordered are listed, but only abnormal results are displayed) Labs Reviewed  GROUP A STREP BY PCR  RESP PANEL BY RT-PCR (RSV, FLU A&B, COVID)  [id number]    EKG: None  Radiology: No results found.   Procedures   Medications Ordered in the ED - No data to display                                  Medical Decision Making Amount and/or Complexity of Data Reviewed Labs: ordered.  Risk Prescription drug management.   48 year old female who presents to the emergency department with sore throat. Differential diagnosis includes viral pharyngitis, group a strep pharyngitis, tonsillitis, uvulitis, mono, peritonsillar abscess, cellulitis.  Although patient appears uncomfortable she is swallowing her own saliva.  Initially patient tachycardic suspected that this is due to poor oral intake. Ordered labs including monoscreen group A strep and respiratory panel all of which are negative for acute finding.  I ordered fluids, viscous lidocaine , Toradol , Decadron  with improvement in pain and ability to swallow.  Patient still quite uncomfortable but has no evidence of peritonsillar abscess or cellulitis of the throat.  At this time patient appears appropriate for discharge with symptomatic treatment for suspected viral tonsillitis.  Will discharge with anti-inflammatories steroids and viscous lidocaine .  A long  discussion at bedside with the patient and her husband for reasons to return for further evaluation including inability to tolerate swallowing her own saliva, difficulty breathing, intractable nausea, neck stiffness, severe headache, vision changes, high fever, hot potato voice, diffuse swelling under the angle of the mandible or redness.  Her husband expressed understanding of reasons to seek immediate medical care.  She presented was appropriate for discharge at this time tolerating p.o. fluids.     Final diagnoses:  None    ED Discharge Orders     None          Arloa Chroman, PA-C 12/17/23 1443  Ula Prentice SAUNDERS, MD 12/17/23 [phone]

## 2023-12-17 NOTE — Discharge Instructions (Signed)
 ### Acute Tonsillitis Home Care     **What is Acute Tonsillitis?**      Acute tonsillitis is an infection or inflammation of the tonsils, which are located at the back of the throat. Most cases are caused by viruses, not bacteria, and do not require antibiotics. If tests have shown that the infection is not caused by strep throat (group A Streptococcus) or mononucleosis, the illness is usually mild and will improve with supportive care at home.[1][2][3]      **How Long Will It Last?**      Most people start to feel better within a week. Symptoms such as sore throat, fever, and difficulty swallowing usually improve on their own.[1][2]      **Home Supportive Care**      - **Rest:** Get plenty of rest to help the body fight the infection.      - **Fluids:** Drink plenty of fluids, such as water, warm tea, or clear soups, to stay hydrated and soothe the throat.      - **Soft Foods:** Eat soft, easy-to-swallow foods if swallowing is painful.      - **Humidified Air:** Using a humidifier or breathing in steam from a warm shower can help ease throat discomfort.      **Pain and Fever Relief**      - **Nonsteroidal Anti-Inflammatory Drugs (NSAIDs):** Medications like ibuprofen  can help reduce throat pain and fever. Follow the dosing instructions on the package or as directed by a healthcare provider. Do not exceed the recommended dose.[4]      - **Acetaminophen :** This is another option for pain and fever relief if NSAIDs are not suitable.      - **Aspirin:** Should not be used in children or teenagers due to the risk of Reye syndrome.[4]      - **Viscous Lidocaine :** This is a prescription topical medication that can be applied to the throat to numb pain. Use only as directed by a healthcare provider. Swallowing large amounts can be dangerous, so follow instructions carefully.[1][4]      **Other Comfort Measures**      - **Salt Water Gargle:** Gargling with warm salt water may help soothe a  sore throat. Mix 1/2 teaspoon of salt in a glass of warm water and gargle several times a day.[1][4]      - **Throat Lozenges or Sprays:** These may provide temporary relief, but should be avoided in young children due to choking risk.[4]      **When to Seek Immediate Medical Attention**      Go to the emergency room or seek urgent care if any of the following occur:      - Difficulty breathing or shortness of breath    - Drooling or inability to control saliva      - Severe neck swelling or stiffness      - High fever that does not improve with medication      - Severe headache, confusion, or neck pain      - Persistent vomiting or signs of dehydration (such as very dark urine or not urinating)      - A muffled or hot potato voice      -Difficulty opening the mouth      These symptoms may be signs of a more serious condition, such as a deep neck infection or abscess, and require immediate medical evaluation.[1][2]      **Antibiotics Are Not Needed**      Since the infection is not caused by strep throat or  mononucleosis, antibiotics will not help and are not recommended. Overuse of antibiotics can cause side effects and contribute to antibiotic resistance.[1][2][3]      **Follow-Up**      If symptoms do not improve after one week, or if new symptoms develop, contact a healthcare provider for further evaluation.      **Summary**      Most cases of acute tonsillitis that are not caused by strep or mononucleosis will improve with rest, fluids, and pain relief. Watch for any warning signs listed above, and seek medical care if they occur.[1][2][4][3]      ### References  1. Appropriate Antibiotic Use for Acute Respiratory Tract Infection in Adults: Advice for High-Value Care From the [employer] and the Centers for Disease Control and Prevention. Jaley Yan AM, Vinie MAYERS, Qaseem A. Annals of Internal Medicine. 2016;164(6):425-34. doi:10.7326/M15-1840. 2.  Tonsillitis and Tonsilloliths: Diagnosis and Management. Claudene BILLI Remington R, Myrex P. American Family Physician. 2023;107(1):35-41. 3. Do Hospitalized Adult Patients With Acute Pharyngotonsillitis Need Empiric Antibiotics? The Impact on Antimicrobial Stewardship. Lemon REMAK, Hsiao MC, Jonda SH, et al. Microorganisms. 2025;13(3):628. doi:10.3390/microorganisms13030628. 4. Clinical Practice Guideline for the Diagnosis and Management of Group a Streptococcal Pharyngitis: 2012 Update by the Infectious Diseases Society of [place]. Shulman ST, Bisno AL, Clegg HW, et al. Clinical Infectious Diseases : An Official Publication of the Infectious Diseases Society of [place]. 2012;55(10):e86-102. doi:10.1093/cid/cis629.

## 2023-12-29 ENCOUNTER — Encounter (HOSPITAL_COMMUNITY): Payer: Self-pay

## 2023-12-29 ENCOUNTER — Ambulatory Visit (HOSPITAL_COMMUNITY): Admitting: Mental Health

## 2024-01-18 ENCOUNTER — Telehealth (HOSPITAL_COMMUNITY): Admitting: Physician Assistant

## 2024-01-18 DIAGNOSIS — F411 Generalized anxiety disorder: Secondary | ICD-10-CM

## 2024-01-18 DIAGNOSIS — F331 Major depressive disorder, recurrent, moderate: Secondary | ICD-10-CM

## 2024-01-19 MED ORDER — ARIPIPRAZOLE 5 MG PO TABS: 5.0000 mg | ORAL_TABLET | Freq: Every day | ORAL | 2 refills | Status: DC

## 2024-01-19 MED ORDER — DULOXETINE HCL 60 MG PO CPEP
120.0000 mg | ORAL_CAPSULE | Freq: Every day | ORAL | 2 refills | Status: DC
Start: 2024-01-19 — End: 2024-03-08

## 2024-01-19 MED ORDER — HYDROXYZINE HCL 50 MG PO TABS: 50.0000 mg | ORAL_TABLET | Freq: Four times a day (QID) | ORAL | 2 refills | Status: DC | PRN

## 2024-01-19 NOTE — Progress Notes (Signed)
 [facility] MD/PA/NP OP Progress Note  Virtual Visit via Video Note  I connected with Catherine Koch on 01/18/24 at 10:30 AM EDT by a video enabled telemedicine application and verified that I am speaking with the correct person using two identifiers.  Location: Patient: Home Provider: Clinic   I discussed the limitations of evaluation and management by telemedicine and the availability of in person appointments. The patient expressed understanding and agreed to proceed.  Follow Up Instructions:  I discussed the assessment and treatment plan with the patient. The patient was provided an opportunity to ask questions and all were answered. The patient agreed with the plan and demonstrated an understanding of the instructions.   The patient was advised to call back or seek an in-person evaluation if the symptoms worsen or if the condition fails to improve as anticipated.  I provided 28 minutes of non-face-to-face time during this encounter.  Catherine FORBES Bolster, PA    01/18/2024 10:30 AM Catherine Koch  MRN:  [id number]  Chief Complaint:  Chief Complaint  Patient presents with   Follow-up   Medication Refill   HPI:   Catherine Koch is a 48 year old female with a past psychiatric history significant for major depressive disorder (recurrent episode, moderate) and generalized anxiety disorder who presents to [facility] for follow-up and medication management.  Patient was last seen by Dr. Harl on 10/18/2023.  During her last encounter, patient was being managed on the following psychiatric medications:  Abilify  5 mg daily Hydroxyzine  50 mg every 6 hours as needed Duloxetine  120 mg daily  Patient presents to the encounter stating that she experienced a reaction to the Abilify  when taking the medication.  Patient reports that she is currently struggling with tonsillitis and states that she will not be taking her medications until her tonsillitis has  cleared up.  Patient continues to endorse depression and rates her depression an 8 out of 10 with 10 being most severe.  Patient's depression is attributed to the health of her dog, financial issues, issues with maintenance in her house, ear troubles, and her sister undergoing surgery.  Patient endorses depressive episodes every day on occasion.  Patient endorses the following depressive symptoms: feelings of sadness, lack of motivation, decreased concentration, decreased energy, irritability, feelings of worthlessness, and hopelessness.  Patient denies feelings of guilt.    In addition to her depression, patient endorses elevated anxiety and rates her anxiety a 10 out of 10.  Patient reports that her anxiety tends to be worse than her depression.  She reports that she picks at her skin due to her depression and anxiety.  Patient notes that it has also been very difficult for her to focus on things and that she gets tired easily during the day.  A PHQ-9 screening was performed with the patient scoring a 22.  A GAD-7 screen was also performed with the patient scoring a 21.  Patient is alert and oriented x 4, calm, cooperative, and fully engaged in conversation during the encounter.  Patient describes her mood as worried and depressed.  Patient exhibits depressed mood with congruent affect.  Patient denies suicidal or homicidal ideations.  She further denies auditory or visual hallucinations and does not appear to be responding to internal/external stimuli.  Patient endorses fair sleep and receives on average 5 to 12 hours of sleep per night.  Patient endorses fair appetite and eats on average 2 meals per day.  Patient endorses alcohol consumption sparingly.  Patient denies tobacco use or illicit drug use.  She reports that she occasionally partakes in CBD Gummies.  Visit Diagnosis:    ICD-10-CM   1. Major depressive disorder, recurrent episode, moderate (HCC)  F33.1 DULoxetine  (CYMBALTA ) 60 MG capsule     ARIPiprazole  (ABILIFY ) 5 MG tablet    hydrOXYzine  (ATARAX ) 50 MG tablet    2. Generalized anxiety disorder  F41.1 hydrOXYzine  (ATARAX ) 50 MG tablet      Past Psychiatric History:  Auditory processing disorder (as a child) PTSD Anxiety Depression  Past Medical History:  Past Medical History:  Diagnosis Date   ADHD    Anxiety    Arthritis    pt. states in knees   Chronic gastritis    Chronic sinus infection    Closed fracture of right distal fibula 11/19/2013   Complication of anesthesia    pt. states difficult to wake up   Depression    GERD (gastroesophageal reflux disease)    Headache    pt. states random migraines   Hemorrhoids    High cholesterol    History of bronchitis    History of degenerative disc disease    Hypertension    Ovarian failure    PTSD (post-traumatic stress disorder)    Shortness of breath    exertion from crutches   Sleep apnea    cpap   UTI (lower urinary tract infection)    Wears glasses     Past Surgical History:  Procedure Laterality Date   BIOPSY  12/10/2019   Procedure: BIOPSY;  Surgeon: Shila Gustav GAILS, MD;  Location: [facility];  Service: Endoscopy;;   CHOLECYSTECTOMY  2010   CHOLECYSTECTOMY     COLONOSCOPY WITH PROPOFOL  N/A 12/10/2019   Procedure: COLONOSCOPY WITH PROPOFOL ;  Surgeon: Shila Gustav GAILS, MD;  Location: [facility];  Service: Endoscopy;  Laterality: N/A;   ESOPHAGOGASTRODUODENOSCOPY     ESOPHAGOGASTRODUODENOSCOPY (EGD) WITH PROPOFOL  N/A 12/10/2019   Procedure: ESOPHAGOGASTRODUODENOSCOPY (EGD) WITH PROPOFOL ;  Surgeon: Shila Gustav GAILS, MD;  Location: [facility];  Service: Endoscopy;  Laterality: N/A;   NASAL SEPTOPLASTY W/ TURBINOPLASTY Bilateral 12/11/2014   Procedure: NASAL SEPTOPLASTY AND BILATERAL INFERIOR TURBINATE REDUCTION;  Surgeon: Alm Bouche, MD;  [facility] OR;  Service: ENT;  Laterality: Bilateral;   ORIF FIBULA FRACTURE Right 11/19/2013   Procedure: OPEN REDUCTION INTERNAL FIXATION (ORIF)  RIGHT FIBULA FRACTURE;  Surgeon: Fonda SHAUNNA Olmsted, MD;  Location: [facility] OR;  Service: Orthopedics;  Laterality: Right;   WISDOM TOOTH EXTRACTION      Family Psychiatric History:  Sister - anxiety and depression Patient notes that her mother has untreated mental health conditions  Family History:  Family History  Problem Relation Age of Onset   Depression Sister    Allergies Sister    Cancer Paternal Grandfather    Heart disease Paternal Grandmother    Cancer Paternal Grandmother        BREAST CANCER    Social History:  Social History   Socioeconomic History   Marital status: Married    Spouse name: Not on file   Number of children: 0   Years of education: Not on file   Highest education level: Some college, no degree  Occupational History    Comment: UNEMPOLYEED   Tobacco Use   Smoking status: Former    Current packs/day: 0.00    Types: Cigarettes    Quit date: 05/24/2010    Years since quitting: 13.7    Passive exposure: Never   Smokeless tobacco: Never  Tobacco comments:    Pt. states she used to be a social smoker  Vaping Use   Vaping status: Never Used  Substance and Sexual Activity   Alcohol use: Yes   Drug use: No   Sexual activity: Yes    Birth control/protection: None  Other Topics Concern   Not on file  Social History Narrative   Not on file   Social Drivers of Health   Financial Resource Strain: High Risk (01/06/2022)   Overall Financial Resource Strain (CARDIA)    Difficulty of Paying Living Expenses: Hard  Food Insecurity: Food Insecurity Present (01/06/2022)   Hunger Vital Sign    Worried About Running Out of Food in the Last Year: Sometimes true    Ran Out of Food in the Last Year: Sometimes true  Transportation Needs: No Transportation Needs (01/06/2022)   PRAPARE - Administrator, Civil Service (Medical): No    Lack of Transportation (Non-Medical): No  Physical Activity: Inactive (01/06/2022)   Exercise Vital Sign    Days of  Exercise per Week: 0 days    Minutes of Exercise per Session: 0 min  Stress: Stress Concern Present (01/06/2022)   [employer] of Occupational Health - Occupational Stress Questionnaire    Feeling of Stress : Very much  Social Connections: Moderately Isolated (09/20/2023)   Social Connection and Isolation Panel    Frequency of Communication with Friends and Family: Three times a week    Frequency of Social Gatherings with Friends and Family: Never    Attends Religious Services: Never    Database administrator or Organizations: No    Attends Banker Meetings: Never    Marital Status: Married    Allergies:  Allergies  Allergen Reactions   Bee Pollen Anaphylaxis, Swelling, Hives and Itching    Other reaction(s): eye redness   Hydrolyzed Silk Hives, Itching and Swelling   Sulfa Antibiotics Anaphylaxis, Swelling and Rash    Throat swelling/ rashes    Metabolic Disorder Labs: No results found for: HGBA1C, MPG No results found for: PROLACTIN No results found for: CHOL, TRIG, HDL, CHOLHDL, VLDL, LDLCALC Lab Results  Component Value Date   TSH 1.90 04/11/2012    Therapeutic Level Labs: No results found for: LITHIUM No results found for: VALPROATE No results found for: CBMZ  Current Medications: Current Outpatient Medications  Medication Sig Dispense Refill   amoxicillin -clavulanate (AUGMENTIN ) 875-125 MG tablet Take 1 tablet by mouth 2 (two) times daily. 20 tablet 0   ARIPiprazole  (ABILIFY ) 5 MG tablet Take 1 tablet (5 mg total) by mouth daily. 30 tablet 2   benzonatate  (TESSALON ) 100 MG capsule Take 1-2 capsules (100-200 mg total) by mouth 3 (three) times daily as needed. 30 capsule 0   colesevelam  (WELCHOL ) 625 MG tablet Take 1 tablet (625 mg total) by mouth daily with breakfast. 90 tablet 3   DULoxetine  (CYMBALTA ) 60 MG capsule Take 2 capsules (120 mg total) by mouth daily. 60 capsule 2   fluticasone  (FLONASE ) 50 MCG/ACT nasal spray  Place 2 sprays into both nostrils daily. 16 g 0   hydrOXYzine  (ATARAX ) 50 MG tablet Take 1 tablet (50 mg total) by mouth every 6 (six) hours as needed. 120 tablet 2   ibuprofen  (ADVIL ) 800 MG tablet Take 1 tablet (800 mg total) by mouth every 6 (six) hours as needed for moderate pain (pain score 4-6). 20 tablet 0   lidocaine  (XYLOCAINE ) 2 % solution Use as directed 10 mLs in the mouth  or throat every 3 (three) hours as needed for mouth pain. (Patient not taking: Reported on 02/14/2024) 100 mL 0   metFORMIN (GLUMETZA) 500 MG (MOD) 24 hr tablet Take 500 mg by mouth daily with breakfast.     methylPREDNISolone  (MEDROL  DOSEPAK) 4 MG TBPK tablet Use as directed 21 tablet 0   metoprolol tartrate (LOPRESSOR) 25 MG tablet Take 25 mg by mouth 2 (two) times daily.     misoprostol (CYTOTEC) 200 MCG tablet Take 200 mcg by mouth 2 (two) times daily as needed (stomach ulcers).     naproxen  (NAPROSYN ) 500 MG tablet Take 1 tablet (500 mg total) by mouth 2 (two) times daily. 30 tablet 0   omeprazole  (PRILOSEC) 40 MG capsule Take 1 capsule (40 mg total) by mouth 2 (two) times daily before lunch and supper. 90 capsule 0   pregabalin (LYRICA) 50 MG capsule Take 50 mg by mouth 2 (two) times daily.     spironolactone (ALDACTONE) 25 MG tablet Take 25 mg by mouth daily.     Turmeric (QC TUMERIC COMPLEX PO) Take 1 tablet by mouth daily.     No current facility-administered medications for this visit.     Musculoskeletal: Strength & Muscle Tone: within normal limits Gait & Station: normal Patient leans: N/A  Psychiatric Specialty Exam: Review of Systems  Psychiatric/Behavioral:  Positive for dysphoric mood and sleep disturbance. Negative for decreased concentration, hallucinations, self-injury and suicidal ideas. The patient is nervous/anxious. The patient is not hyperactive.     There were no vitals taken for this visit.There is no height or weight on file to calculate BMI.  General Appearance: Well Groomed  Eye  Contact:  Good  Speech:  Clear and Coherent and Normal Rate  Volume:  Normal  Mood:  Anxious and Depressed  Affect:  Congruent  Thought Process:  Coherent, Goal Directed, and Descriptions of Associations: Intact  Orientation:  Full (Time, Place, and Person)  Thought Content: WDL   Suicidal Thoughts:  No  Homicidal Thoughts:  No  Memory:  Immediate;   Good Recent;   Good Remote;   Good  Judgement:  Good  Insight:  Good  Psychomotor Activity:  Normal  Concentration:  Concentration: Good and Attention Span: Good  Recall:  Good  Fund of Knowledge: Good  Language: Good  Akathisia:  No  Handed:  Right  AIMS (if indicated): not done  Assets:  Communication Skills Desire for Improvement Financial Resources/Insurance Housing Intimacy Social Support  ADL's:  Intact  Cognition: WNL  Sleep:  Fair   Screenings: AIMS    Flowsheet Row Video Visit from 01/18/2024 in [facility]  AIMS Total Score 0   GAD-7    Flowsheet Row Video Visit from 01/18/2024 in [facility] Video Visit from 10/18/2023 in [facility] Video Visit from 08/18/2023 in [facility] Video Visit from 06/09/2023 in [facility] Video Visit from 03/30/2023 in [facility]  Total GAD-7 Score 21 20 18 14 12    PHQ2-9    Flowsheet Row Video Visit from 01/18/2024 in [facility] Video Visit from 10/18/2023 in [facility] Video Visit from 08/18/2023 in [facility] Video Visit from 06/09/2023 in [facility] Video Visit from 03/30/2023 in [facility]  PHQ-2 Total Score 6 6 4 5 4   PHQ-9 Total Score 22 23 20  18  18   Flowsheet Row Video Visit from 01/18/2024 in [facility] ED from 12/17/2023 in [facility] Emergency Department at [facility] ED from 11/01/2023 in [facility] Department at [facility]  C-SSRS RISK CATEGORY No Risk No Risk No Risk     Assessment and Plan:   Catherine Koch is a 48 year old female with a past psychiatric history significant for major depressive disorder (recurrent episode, moderate) and generalized anxiety disorder who presents to [facility] for follow-up and medication management.  Patient presents to the encounter stating that she developed a reaction to her Abilify .  She also reports that she has been dealing with a bout of tonsillitis so she will not be taking her medications until her throat clears up.  Patient continues to endorse worsening depression and attributes her depression to several stressors in her life.  Patient also endorses elevated anxiety stating that she often picks at her skin due to her depression and anxiety.  A PHQ-9 screen was performed with the patient scoring a 22.  A GAD-7 screen was also performed with the patient scoring a 21.  Patient was encouraged to rechallenge Abilify  to determine if she truly had a reaction to the medication or if it was just her tonsillitis.  Patient was agreeable to recommendation.  Patient to continue taking her other medications as prescribed as well.  Patient's medications to be e-prescribed to pharmacy of choice.  A [place] Suicide Severity Rating Scale was performed with the patient being considered no risk.  Patient denies suicidal ideations and is able to contract for safety at this time.   Collaboration of Care: Collaboration of Care: Medication Management AEB provider managing patient's psychiatric medications, Psychiatrist AEB patient being followed by mental health provider at this facility, Other provider involved in patient's care AEB patient being seen by otolaryngology, and Referral or follow-up with counselor/therapist AEB  patient being seen by licensed clinical social worker at this facility  Patient/Guardian was advised Release of Information must be obtained prior to any record release in order to collaborate their care with an outside provider. Patient/Guardian was advised if they have not already done so to contact the registration department to sign all necessary forms in order for us  to release information regarding their care.   Consent: Patient/Guardian gives verbal consent for treatment and assignment of benefits for services provided during this visit. Patient/Guardian expressed understanding and agreed to proceed.   1. Major depressive disorder, recurrent episode, moderate (HCC)  - DULoxetine  (CYMBALTA ) 60 MG capsule; Take 2 capsules (120 mg total) by mouth daily.  Dispense: 60 capsule; Refill: 2 - ARIPiprazole  (ABILIFY ) 5 MG tablet; Take 1 tablet (5 mg total) by mouth daily.  Dispense: 30 tablet; Refill: 2 - hydrOXYzine  (ATARAX ) 50 MG tablet; Take 1 tablet (50 mg total) by mouth every 6 (six) hours as needed.  Dispense: 120 tablet; Refill: 2  2. Generalized anxiety disorder  - hydrOXYzine  (ATARAX ) 50 MG tablet; Take 1 tablet (50 mg total) by mouth every 6 (six) hours as needed.  Dispense: 120 tablet; Refill: 2  Patient to follow-up in 6 weeks Provider spent a total of 28 minutes with the patient/reviewing patient's chart  Catherine FORBES Bolster, PA 01/18/2024, 10:30 AM

## 2024-02-14 ENCOUNTER — Ambulatory Visit (INDEPENDENT_AMBULATORY_CARE_PROVIDER_SITE_OTHER)

## 2024-02-14 ENCOUNTER — Encounter (INDEPENDENT_AMBULATORY_CARE_PROVIDER_SITE_OTHER): Payer: Self-pay

## 2024-02-14 VITALS — BP 101/71 | HR 72 | Temp 98.0°F | Ht 68.0 in | Wt 370.0 lb

## 2024-02-14 DIAGNOSIS — Z87891 Personal history of nicotine dependence: Secondary | ICD-10-CM

## 2024-02-14 DIAGNOSIS — K219 Gastro-esophageal reflux disease without esophagitis: Secondary | ICD-10-CM

## 2024-02-14 DIAGNOSIS — R490 Dysphonia: Secondary | ICD-10-CM | POA: Diagnosis not present

## 2024-02-14 DIAGNOSIS — J370 Chronic laryngitis: Secondary | ICD-10-CM | POA: Diagnosis not present

## 2024-02-14 MED ORDER — OMEPRAZOLE 40 MG PO CPDR: 40.0000 mg | DELAYED_RELEASE_CAPSULE | Freq: Two times a day (BID) | ORAL | 0 refills | Status: AC

## 2024-02-14 MED ORDER — AMOXICILLIN-POT CLAVULANATE 875-125 MG PO TABS: 1.0000 | ORAL_TABLET | Freq: Two times a day (BID) | ORAL | 0 refills | Status: DC

## 2024-02-14 NOTE — Progress Notes (Signed)
 Dear Dr. Durel, Here is my assessment for our mutual patient, Catherine Koch. Thank you for allowing me the opportunity to care for your patient. Please do not hesitate to contact me should you have any other questions. Sincerely, Dr. Penne Croak  Otolaryngology Clinic Note Referring provider: Dr. Durel HPI:  Catherine Koch is a 48 y.o. female kindly referred by Dr. Durel for evaluation of sore throat  Reason for Consult The patient presents with a persistent sore throat since late July or early August, despite recent improvement. The primary reason for the consultation is to evaluate ongoing sore throat symptoms and determine the appropriate treatment plan.  History of Present Illness The patient reports a sore throat persisting since late July or early August, which has improved over the past week but remains bothersome. The patient has a history of recurrent tonsillitis, and this is the second recent occurrence. There is a history of strep throat during childhood. The patient experiences pain while swallowing, which was previously severe but has improved. No current cough is reported. The patient uses a CPAP machine for sleep apnea. Recent nosebleeds have occurred, potentially due to dryness in the environment. The patient is currently on Omeprazole  once a day for gastritis.  No weight loss. No dysphagia. Coughing up green and yellow phlegm. Former smoker, quit 2012.   Independent Review of Additional Tests or Records:  none  PMH/Meds/All/SocHx/FamHx/ROS:   Past Medical History:  Diagnosis Date   ADHD    Anxiety    Arthritis    pt. states in knees   Chronic gastritis    Chronic sinus infection    Closed fracture of right distal fibula 11/19/2013   Complication of anesthesia    pt. states difficult to wake up   Depression    GERD (gastroesophageal reflux disease)    Headache    pt. states random migraines   Hemorrhoids    High cholesterol    History of bronchitis     History of degenerative disc disease    Hypertension    Ovarian failure    PTSD (post-traumatic stress disorder)    Shortness of breath    exertion from crutches   Sleep apnea    cpap   UTI (lower urinary tract infection)    Wears glasses      Past Surgical History:  Procedure Laterality Date   BIOPSY  12/10/2019   Procedure: BIOPSY;  Surgeon: Shila Gustav GAILS, MD;  Location: [facility];  Service: Endoscopy;;   CHOLECYSTECTOMY  2010   CHOLECYSTECTOMY     COLONOSCOPY WITH PROPOFOL  N/A 12/10/2019   Procedure: COLONOSCOPY WITH PROPOFOL ;  Surgeon: Shila Gustav GAILS, MD;  Location: [facility];  Service: Endoscopy;  Laterality: N/A;   ESOPHAGOGASTRODUODENOSCOPY     ESOPHAGOGASTRODUODENOSCOPY (EGD) WITH PROPOFOL  N/A 12/10/2019   Procedure: ESOPHAGOGASTRODUODENOSCOPY (EGD) WITH PROPOFOL ;  Surgeon: Shila Gustav GAILS, MD;  Location: [facility];  Service: Endoscopy;  Laterality: N/A;   NASAL SEPTOPLASTY W/ TURBINOPLASTY Bilateral 12/11/2014   Procedure: NASAL SEPTOPLASTY AND BILATERAL INFERIOR TURBINATE REDUCTION;  Surgeon: Alm Bouche, MD;  Location: [facility] OR;  Service: ENT;  Laterality: Bilateral;   ORIF FIBULA FRACTURE Right 11/19/2013   Procedure: OPEN REDUCTION INTERNAL FIXATION (ORIF) RIGHT FIBULA FRACTURE;  Surgeon: Fonda SHAUNNA Olmsted, MD;  Location: [facility] OR;  Service: Orthopedics;  Laterality: Right;   WISDOM TOOTH EXTRACTION      Family History  Problem Relation Age of Onset   Depression Sister    Allergies Sister    Cancer Paternal Grandfather  Heart disease Paternal Grandmother    Cancer Paternal Grandmother        BREAST CANCER     Social Connections: Moderately Isolated (09/20/2023)   Social Connection and Isolation Panel    Frequency of Communication with Friends and Family: Three times a week    Frequency of Social Gatherings with Friends and Family: Never    Attends Religious Services: Never    Database administrator or Organizations: No    Attends Tax inspector Meetings: Never    Marital Status: Married      Current Outpatient Medications:    amoxicillin -clavulanate (AUGMENTIN ) 875-125 MG tablet, Take 1 tablet by mouth 2 (two) times daily., Disp: 20 tablet, Rfl: 0   ARIPiprazole  (ABILIFY ) 5 MG tablet, Take 1 tablet (5 mg total) by mouth daily., Disp: 30 tablet, Rfl: 2   benzonatate  (TESSALON ) 100 MG capsule, Take 1-2 capsules (100-200 mg total) by mouth 3 (three) times daily as needed., Disp: 30 capsule, Rfl: 0   colesevelam  (WELCHOL ) 625 MG tablet, Take 1 tablet (625 mg total) by mouth daily with breakfast., Disp: 90 tablet, Rfl: 3   DULoxetine  (CYMBALTA ) 60 MG capsule, Take 2 capsules (120 mg total) by mouth daily., Disp: 60 capsule, Rfl: 2   fluticasone  (FLONASE ) 50 MCG/ACT nasal spray, Place 2 sprays into both nostrils daily., Disp: 16 g, Rfl: 0   hydrOXYzine  (ATARAX ) 50 MG tablet, Take 1 tablet (50 mg total) by mouth every 6 (six) hours as needed., Disp: 120 tablet, Rfl: 2   ibuprofen  (ADVIL ) 800 MG tablet, Take 1 tablet (800 mg total) by mouth every 6 (six) hours as needed for moderate pain (pain score 4-6)., Disp: 20 tablet, Rfl: 0   lidocaine  (XYLOCAINE ) 2 % solution, Use as directed 10 mLs in the mouth or throat every 3 (three) hours as needed for mouth pain. (Patient not taking: Reported on 02/14/2024), Disp: 100 mL, Rfl: 0   metFORMIN (GLUMETZA) 500 MG (MOD) 24 hr tablet, Take 500 mg by mouth daily with breakfast., Disp: , Rfl:    methylPREDNISolone  (MEDROL  DOSEPAK) 4 MG TBPK tablet, Use as directed, Disp: 21 tablet, Rfl: 0   metoprolol tartrate (LOPRESSOR) 25 MG tablet, Take 25 mg by mouth 2 (two) times daily., Disp: , Rfl:    misoprostol (CYTOTEC) 200 MCG tablet, Take 200 mcg by mouth 2 (two) times daily as needed (stomach ulcers)., Disp: , Rfl:    naproxen  (NAPROSYN ) 500 MG tablet, Take 1 tablet (500 mg total) by mouth 2 (two) times daily., Disp: 30 tablet, Rfl: 0   omeprazole  (PRILOSEC) 40 MG capsule, Take 1 capsule (40 mg  total) by mouth 2 (two) times daily before lunch and supper., Disp: 90 capsule, Rfl: 0   pregabalin (LYRICA) 50 MG capsule, Take 50 mg by mouth 2 (two) times daily., Disp: , Rfl:    spironolactone (ALDACTONE) 25 MG tablet, Take 25 mg by mouth daily., Disp: , Rfl:    Turmeric (QC TUMERIC COMPLEX PO), Take 1 tablet by mouth daily., Disp: , Rfl:    Physical Exam:   BP 101/71 (BP Location: Right Arm, Patient Position: Sitting)   Pulse 72   Temp 98 F (36.7 C)   Ht 5' 8 (1.727 m)   Wt (!) 370 lb (167.8 kg)   SpO2 95%   BMI 56.26 kg/m   The patient was awake, alert, and appropriate. The external ears were inspected, and otoscopy was performed to evaluate the external auditory canals and tympanic membranes. The nasal  cavity and septum were examined for mucosal changes, obstruction, or discharge. The oral cavity and oropharynx were inspected for mucosal lesions, infection, or tonsillar hypertrophy. The neck was palpated for lymphadenopathy, thyroid abnormalities, or other masses. Cranial nerve function was grossly intact.  Pertinent Findings: Tonsils with crypts, 2+ hypertrophy  Seprately Identifiable Procedures:  I personally ordered, reviewed and interpreted the following with the patient today  Procedure Note Pre-procedure diagnosis:  Dysphonia and sore throat Post-procedure diagnosis: Same Procedure: Transnasal Fiberoptic Laryngoscopy, CPT 31575 - Mod 25 Indication: Dysphonia and sore throat, hx of tobacco use Complications: None apparent EBL: 0 mL  The procedure was undertaken to further evaluate the patient's complaint of hoarseness and sore throat, with mirror exam inadequate for appropriate examination due to gag reflex and poor patient tolerance  Procedure:  Patient was identified as correct patient. Verbal consent was obtained. The nose was sprayed with oxymetazoline  and 4% lidocaine . The The flexible laryngoscope was passed through the nose to view the nasal cavity, pharynx  (oropharynx, hypopharynx) and larynx.  The larynx was examined at rest and during multiple phonatory tasks. Documentation was obtained and reviewed with patient. The scope was removed. The patient tolerated the procedure well.  Findings: The nasal cavity and nasopharynx did not reveal any masses or lesions, mucosa appeared to be without obvious lesions. The tongue base, pharyngeal walls, piriform sinuses, vallecula, epiglottis and postcricoid region are normal in appearance EXCEPT: excessive thick mucous. The visualized portion of the subglottis and proximal trachea is widely patent. The vocal folds are mobile bilaterally. There are no lesions on the free edge of the vocal folds nor elsewhere in the larynx worrisome for malignancy.    Electronically signed by: Penne Croak, DO 02/14/2024 11:26 AM    Impression & Plans:  Geanette Circle is a 48 y.o. female with dysphonia and sore throat for 2 months with fluctuating severity.   1. Chronic laryngopharyngitis   2. Laryngopharyngeal reflux (LPR)   3. Morbid obesity (HCC)   4. Hx of tobacco use, presenting hazards to health   5. Dysphonia     Plan - Findings and diagnoses discussed in detail with the patient. - Risks, benefits, and alternatives were reviewed. Through shared decision making, the patient elects to proceed with medical therapy. - Flexible laryngoscopy with thick pooling mucous. No masses or lesions.  - Discussed causes. Low suspicion for fungal etiology given not using inhalers at this time. Will try antibiotic and optimize laryngeal environment with BID PPI - The patient's overall health has not been improving. She has been gaining weight, less active and in more physical pain. I spent 20 minutes discussing overall health and ways to modify diet and exercise, and these benefits.  - Orders placed: No orders of the defined types were placed in this encounter.  - Medications prescribed/continued/adjusted:  Meds ordered this  encounter  Medications   amoxicillin -clavulanate (AUGMENTIN ) 875-125 MG tablet    Sig: Take 1 tablet by mouth 2 (two) times daily.    Dispense:  20 tablet    Refill:  0   omeprazole  (PRILOSEC) 40 MG capsule    Sig: Take 1 capsule (40 mg total) by mouth 2 (two) times daily before lunch and supper.    Dispense:  90 capsule    Refill:  0   - Education materials provided to the patient. - Follow up: 8 weeks. Patient instructed to return sooner or go to the ED if new/worsening symptoms develop.   Thank you for allowing me the opportunity  to care for your patient. Please do not hesitate to contact me should you have any other questions.  Sincerely, Penne Croak, DO Otolaryngologist (ENT) [facility] Phone: [phone] Fax: [phone]  02/14/2024, 11:26 AM   MDM:

## 2024-02-21 ENCOUNTER — Encounter (HOSPITAL_COMMUNITY): Payer: Self-pay | Admitting: Physician Assistant

## 2024-02-24 ENCOUNTER — Ambulatory Visit (HOSPITAL_COMMUNITY): Admitting: Mental Health

## 2024-02-24 DIAGNOSIS — F329 Major depressive disorder, single episode, unspecified: Secondary | ICD-10-CM | POA: Diagnosis not present

## 2024-02-24 DIAGNOSIS — F411 Generalized anxiety disorder: Secondary | ICD-10-CM

## 2024-02-24 DIAGNOSIS — F331 Major depressive disorder, recurrent, moderate: Secondary | ICD-10-CM

## 2024-02-24 NOTE — Progress Notes (Signed)
   THERAPIST PROGRESS NOTE Virtual Visit via Video Note  I connected with Catherine Koch on 02/24/24 at 11:00 AM EDT by a video enabled telemedicine application and verified that I am speaking with the correct person using two identifiers.  Location: Patient: home address on file Provider: remote office   I discussed the limitations of evaluation and management by telemedicine and the availability of in person appointments. The patient expressed understanding and agreed to proceed.  I discussed the assessment and treatment plan with the patient. The patient was provided an opportunity to ask questions and all were answered. The patient agreed with the plan and demonstrated an understanding of the instructions.   The patient was advised to call back or seek an in-person evaluation if the symptoms worsen or if the condition fails to improve as anticipated.  I provided 29 minutes of non-face-to-face time during this encounter.   [name], [facility]   Session Time: 11:05 pm   Participation Level: Minimal  Behavioral Response: Fairly GroomedLethargicWNL  Type of Therapy: Individual Therapy  Treatment Goals addressed:  STG: Catherine Koch will increase management of depression AEB engagement in enjoyable activity daily with ability to process through stressors in balanced manner per self report within the next 90 days.   ProgressTowards Goals: Progressing  Interventions: Supportive  Summary:  Catherine Koch is a 48 y.o. female who presents with dx of major depression and generalized anxiety. Catherine Koch presents alert and oriented; mood and affect dysphoric. Speech clear and coherent at slow rate and  low tone. . Presents for virtual session, laying down in bed, reports to have just awaken, in the dark. Noticeable tube in nose and reports to be engaging in at home sleep study. Shares to no sleep well with apparatus on face and unable to wear sleep pap machine. Notes events have been a  little crazy sharing for house to continue to be a mess and ongoing thoughts on husband. Shares has been attempting to shower more with parents to have had shower redone. Shares medications to be helping depression, plays video games when able. Notes presently being tired with apparent nodding during session. Denies safety concerns.   Suicidal/Homicidal: Nowithout intent/plan  Therapist Response: Therapist engaged Catherine Koch in tele-therapy session. Confirmed location and ability to hold confidential session. Completed check in and assessed for current level of stressors and sxs management. Provided safe space for [name] to shares thoughts and feelings in regard to stressors. Explored daily activity and importance of behavioral activation to support in management of depression and daily attendance to ADLs Due to lethargy agree to end session early.   Plan: Return again in  x 5 weeks.  Diagnosis: No diagnosis found.  Collaboration of Care: Other None  Patient/Guardian was advised Release of Information must be obtained prior to any record release in order to collaborate their care with an outside provider. Patient/Guardian was advised if they have not already done so to contact the registration department to sign all necessary forms in order for us  to release information regarding their care.   Consent: Patient/Guardian gives verbal consent for treatment and assignment of benefits for services provided during this visit. Patient/Guardian expressed understanding and agreed to proceed.   Ty Bernice Montandon, [facility] 02/24/2024

## 2024-03-08 ENCOUNTER — Telehealth (INDEPENDENT_AMBULATORY_CARE_PROVIDER_SITE_OTHER): Admitting: Psychiatry

## 2024-03-08 ENCOUNTER — Encounter (HOSPITAL_COMMUNITY): Payer: Self-pay | Admitting: Psychiatry

## 2024-03-08 DIAGNOSIS — F411 Generalized anxiety disorder: Secondary | ICD-10-CM | POA: Diagnosis not present

## 2024-03-08 DIAGNOSIS — F331 Major depressive disorder, recurrent, moderate: Secondary | ICD-10-CM

## 2024-03-08 MED ORDER — ARIPIPRAZOLE 5 MG PO TABS: 5.0000 mg | ORAL_TABLET | Freq: Every day | ORAL | 2 refills | Status: DC

## 2024-03-08 MED ORDER — HYDROXYZINE HCL 50 MG PO TABS: 50.0000 mg | ORAL_TABLET | Freq: Four times a day (QID) | ORAL | 3 refills | Status: DC | PRN

## 2024-03-08 MED ORDER — DULOXETINE HCL 60 MG PO CPEP: 120.0000 mg | ORAL_CAPSULE | Freq: Every day | ORAL | 3 refills | Status: DC

## 2024-03-08 NOTE — Progress Notes (Signed)
 [facility] MD/PA/NP OP Progress Note Virtual Visit via Video Note  I connected with Catherine Koch on 03/08/24 at  2:00 PM EDT by a video enabled telemedicine application and verified that I am speaking with the correct person using two identifiers.  Location: Patient: Home Provider: Clinic   I discussed the limitations of evaluation and management by telemedicine and the availability of in person appointments. The patient expressed understanding and agreed to proceed.  I provided 30 minutes of non-face-to-face time during this encounter.    03/08/2024 10:56 AM Catherine Koch  MRN:  [id number]  Chief Complaint: I have a lot on my mind   HPI: 48 year old female seen today  for follow-up psychiatric evaluation. She has a history of auditory processing disorder (as a child), depression, PTSD,  and anxiety, and is currently managed on Abilify  5 daily, Hydroxyzine  50mg  four times daily as needed and Cymbalta  120 mg daily. She informed Clinical research associate that she has not restarted Abilify  and reports that her other medications  are somewhat effective in managing her psychiatric conditions.    Today she was well groomed, pleasant, cooperative, and engaged in conversation. She informed Clinical research associate that she has a lot on her mind. She notes that she continues to suffer from fibromyalgia and notes that she is in constant pain. She quantifies her pain as 5/10. She notes that her medications are somewhat effective in managing her pain. She also notes that she takes Formula 303 herbal supplements and edible Gummies to help manage her pain.    Patient note that continues to have issues with her marriage. She notes that he continue to not shower. She reports that they are considering having a divorce. She informed Clinical research associate that her parents are trying to convince them to stay together.    Patient reports that she continues to have throat pain. She was started on an antibiotic and reports that things are improving. Provider  asked patient if her CPAP machine may contribute to her soar throat and she note that she does not believe it does.  She reports that her doctors were not extended for which she has been doing.  Patient notes that the above worsens her anxiety and depression. Today provider conducted a GAD-7 and patient scored a 20, at her last visit she scored a 21.  Provider also conducted PHQ-9 and patient scored a 19, at her last visit she scored 22.  She endorses fluctuation in sleep and adequate appetite.  Today she denies SI/HI/VAH, mania, or paranoia.    Patient reports that she did not start Abilify  because she felt it may have contributed to her sore throat.  She does note that she would like to try it to see if it raises her depressive symptoms.  Today Abilify  5 mg reordered and sent to preferred pharmacy.  She will continue her other medications as prescribed.  No other concerns at this time.    Visit Diagnosis:    ICD-10-CM   1. Major depressive disorder, recurrent episode, moderate (HCC)  F33.1 DULoxetine  (CYMBALTA ) 60 MG capsule    ARIPiprazole  (ABILIFY ) 5 MG tablet    hydrOXYzine  (ATARAX ) 50 MG tablet    2. Generalized anxiety disorder  F41.1 hydrOXYzine  (ATARAX ) 50 MG tablet            Past Psychiatric History: Auditory processing disorder (as a child), PTSD, anxiety, and depression  Past Medical History:  Past Medical History:  Diagnosis Date   ADHD    Anxiety  Arthritis    pt. states in knees   Chronic gastritis    Chronic sinus infection    Closed fracture of right distal fibula 11/19/2013   Complication of anesthesia    pt. states difficult to wake up   Depression    GERD (gastroesophageal reflux disease)    Headache    pt. states random migraines   Hemorrhoids    High cholesterol    History of bronchitis    History of degenerative disc disease    Hypertension    Ovarian failure    PTSD (post-traumatic stress disorder)    Shortness of breath    exertion from  crutches   Sleep apnea    cpap   UTI (lower urinary tract infection)    Wears glasses     Past Surgical History:  Procedure Laterality Date   BIOPSY  12/10/2019   Procedure: BIOPSY;  Surgeon: Shila Gustav GAILS, MD;  Location: [facility];  Service: Endoscopy;;   CHOLECYSTECTOMY  2010   CHOLECYSTECTOMY     COLONOSCOPY WITH PROPOFOL  N/A 12/10/2019   Procedure: COLONOSCOPY WITH PROPOFOL ;  Surgeon: Shila Gustav GAILS, MD;  Location: [facility];  Service: Endoscopy;  Laterality: N/A;   ESOPHAGOGASTRODUODENOSCOPY     ESOPHAGOGASTRODUODENOSCOPY (EGD) WITH PROPOFOL  N/A 12/10/2019   Procedure: ESOPHAGOGASTRODUODENOSCOPY (EGD) WITH PROPOFOL ;  Surgeon: Shila Gustav GAILS, MD;  Location: [facility];  Service: Endoscopy;  Laterality: N/A;   NASAL SEPTOPLASTY W/ TURBINOPLASTY Bilateral 12/11/2014   Procedure: NASAL SEPTOPLASTY AND BILATERAL INFERIOR TURBINATE REDUCTION;  Surgeon: Alm Bouche, MD;  Location: [facility] OR;  Service: ENT;  Laterality: Bilateral;   ORIF FIBULA FRACTURE Right 11/19/2013   Procedure: OPEN REDUCTION INTERNAL FIXATION (ORIF) RIGHT FIBULA FRACTURE;  Surgeon: Fonda SHAUNNA Olmsted, MD;  Location: [facility] OR;  Service: Orthopedics;  Laterality: Right;   WISDOM TOOTH EXTRACTION      Family Psychiatric History: Sister anxiety and depression. Notes mother has untreated mental health conditions  Family History:  Family History  Problem Relation Age of Onset   Depression Sister    Allergies Sister    Cancer Paternal Grandfather    Heart disease Paternal Grandmother    Cancer Paternal Grandmother        BREAST CANCER    Social History:  Social History   Socioeconomic History   Marital status: Married    Spouse name: Not on file   Number of children: 0   Years of education: Not on file   Highest education level: Some college, no degree  Occupational History    Comment: UNEMPOLYEED   Tobacco Use   Smoking status: Former    Current packs/day: 0.00    Types: Cigarettes     Quit date: 05/24/2010    Years since quitting: 13.8    Passive exposure: Never   Smokeless tobacco: Never   Tobacco comments:    Pt. states she used to be a social smoker  Vaping Use   Vaping status: Never Used  Substance and Sexual Activity   Alcohol use: Yes   Drug use: No   Sexual activity: Yes    Birth control/protection: None  Other Topics Concern   Not on file  Social History Narrative   Not on file   Social Drivers of Health   Financial Resource Strain: High Risk (01/06/2022)   Overall Financial Resource Strain (CARDIA)    Difficulty of Paying Living Expenses: Hard  Food Insecurity: Food Insecurity Present (01/06/2022)   Hunger Vital Sign    Worried About Running  Out of Food in the Last Year: Sometimes true    Ran Out of Food in the Last Year: Sometimes true  Transportation Needs: No Transportation Needs (01/06/2022)   PRAPARE - Administrator, Civil Service (Medical): No    Lack of Transportation (Non-Medical): No  Physical Activity: Inactive (01/06/2022)   Exercise Vital Sign    Days of Exercise per Week: 0 days    Minutes of Exercise per Session: 0 min  Stress: Stress Concern Present (01/06/2022)   [employer] of Occupational Health - Occupational Stress Questionnaire    Feeling of Stress : Very much  Social Connections: Moderately Isolated (09/20/2023)   Social Connection and Isolation Panel    Frequency of Communication with Friends and Family: Three times a week    Frequency of Social Gatherings with Friends and Family: Never    Attends Religious Services: Never    Database administrator or Organizations: No    Attends Banker Meetings: Never    Marital Status: Married    Allergies:  Allergies  Allergen Reactions   Bee Pollen Anaphylaxis, Swelling, Hives and Itching    Other reaction(s): eye redness   Hydrolyzed Silk Hives, Itching and Swelling   Sulfa Antibiotics Anaphylaxis, Swelling and Rash    Throat swelling/ rashes     Metabolic Disorder Labs: No results found for: HGBA1C, MPG No results found for: PROLACTIN No results found for: CHOL, TRIG, HDL, CHOLHDL, VLDL, LDLCALC Lab Results  Component Value Date   TSH 1.90 04/11/2012    Therapeutic Level Labs: No results found for: LITHIUM No results found for: VALPROATE No results found for: CBMZ  Current Medications: Current Outpatient Medications  Medication Sig Dispense Refill   amoxicillin -clavulanate (AUGMENTIN ) 875-125 MG tablet Take 1 tablet by mouth 2 (two) times daily. 20 tablet 0   ARIPiprazole  (ABILIFY ) 5 MG tablet Take 1 tablet (5 mg total) by mouth daily. 30 tablet 2   benzonatate  (TESSALON ) 100 MG capsule Take 1-2 capsules (100-200 mg total) by mouth 3 (three) times daily as needed. 30 capsule 0   colesevelam  (WELCHOL ) 625 MG tablet Take 1 tablet (625 mg total) by mouth daily with breakfast. 90 tablet 3   DULoxetine  (CYMBALTA ) 60 MG capsule Take 2 capsules (120 mg total) by mouth daily. 60 capsule 3   fluticasone  (FLONASE ) 50 MCG/ACT nasal spray Place 2 sprays into both nostrils daily. 16 g 0   hydrOXYzine  (ATARAX ) 50 MG tablet Take 1 tablet (50 mg total) by mouth every 6 (six) hours as needed. 120 tablet 3   ibuprofen  (ADVIL ) 800 MG tablet Take 1 tablet (800 mg total) by mouth every 6 (six) hours as needed for moderate pain (pain score 4-6). 20 tablet 0   lidocaine  (XYLOCAINE ) 2 % solution Use as directed 10 mLs in the mouth or throat every 3 (three) hours as needed for mouth pain. (Patient not taking: Reported on 02/14/2024) 100 mL 0   metFORMIN (GLUMETZA) 500 MG (MOD) 24 hr tablet Take 500 mg by mouth daily with breakfast.     methylPREDNISolone  (MEDROL  DOSEPAK) 4 MG TBPK tablet Use as directed 21 tablet 0   metoprolol tartrate (LOPRESSOR) 25 MG tablet Take 25 mg by mouth 2 (two) times daily.     misoprostol (CYTOTEC) 200 MCG tablet Take 200 mcg by mouth 2 (two) times daily as needed (stomach ulcers).     naproxen   (NAPROSYN ) 500 MG tablet Take 1 tablet (500 mg total) by mouth 2 (two) times daily.  30 tablet 0   omeprazole  (PRILOSEC) 40 MG capsule Take 1 capsule (40 mg total) by mouth 2 (two) times daily before lunch and supper. 90 capsule 0   pregabalin (LYRICA) 50 MG capsule Take 50 mg by mouth 2 (two) times daily.     spironolactone (ALDACTONE) 25 MG tablet Take 25 mg by mouth daily.     Turmeric (QC TUMERIC COMPLEX PO) Take 1 tablet by mouth daily.     No current facility-administered medications for this visit.     Musculoskeletal: Strength & Muscle Tone: within normal limits and telehealth visit Gait & Station: normal, telehealth visit Patient leans: N/A  Psychiatric Specialty Exam: Review of Systems  There were no vitals taken for this visit.There is no height or weight on file to calculate BMI.  General Appearance: Well Groomed  Eye Contact:  Good  Speech:  Clear and Coherent and Normal Rate  Volume:  Normal  Mood:  Anxious and Depressed  Affect:  Congruent  Thought Process:  Coherent, Goal Directed and Linear  Orientation:  Full (Time, Place, and Person)  Thought Content: WDL and Logical   Suicidal Thoughts:  No  Homicidal Thoughts:  No  Memory:  Immediate;   Good Recent;   Good Remote;   Good  Judgement:  Good  Insight:  Good  Psychomotor Activity:  Normal  Concentration:  Concentration: Fair and Attention Span: Fair  Recall:  Good  Fund of Knowledge: Good  Language: Good  Akathisia:  No  Handed:  Right  AIMS (if indicated): Not done  Assets:  Communication Skills Desire for Improvement Financial Resources/Insurance Housing Intimacy Social Support  ADL's:  Intact  Cognition: WNL  Sleep:  Fair   Screenings: AIMS    Flowsheet Row Video Visit from 01/18/2024 in [facility]  AIMS Total Score 0   GAD-7    Flowsheet Row Video Visit from 03/08/2024 in [facility] Video Visit from 01/18/2024 in [facility] Video Visit from 10/18/2023 in [facility] Video Visit from 08/18/2023 in [facility] Video Visit from 06/09/2023 in [facility]  Total GAD-7 Score 20 21 20 18 14    PHQ2-9    Flowsheet Row Video Visit from 03/08/2024 in [facility] Video Visit from 01/18/2024 in [facility] Video Visit from 10/18/2023 in [facility] Video Visit from 08/18/2023 in [facility] Video Visit from 06/09/2023 in [facility]  PHQ-2 Total Score 5 6 6 4 5   PHQ-9 Total Score 19 22 23 20 18    Flowsheet Row Video Visit from 03/08/2024 in [facility] Video Visit from 01/18/2024 in [facility] ED from 12/17/2023 in [facility] Emergency Department at [facility]  C-SSRS RISK CATEGORY No Risk No Risk No Risk     Assessment and Plan: Patient continues to endorse symptoms of anxiety, depression, and fluctuations in sleep.  Patient also continues to be in increased pain due to fibromyalgia.Patient reports that she did not start Abilify  because she felt it may have contributed to her sore throat.  She does note that she would like to try it to see if it raises her depressive symptoms.  Today Abilify  5 mg reordered and sent to preferred pharmacy.  She will continue her other medications as prescribed.  1. Major depressive disorder, recurrent episode, moderate (  HCC)  Restart- ARIPiprazole  (ABILIFY ) 5 MG tablet; Take 1 tablet (5 mg total) by mouth daily.  Dispense: 30 tablet; Refill: 3 Continue- hydrOXYzine  (ATARAX ) 50 MG tablet; Take 1 tablet (50 mg total) by mouth every 6 (six) hours as needed.  Dispense: 120 tablet; Refill: 3 Continue- DULoxetine  (CYMBALTA ) 60 MG capsule; Take 2 capsules (120 mg total) by mouth daily.   Dispense: 60 capsule; Refill: 3  2. Generalized anxiety disorder  Continue- hydrOXYzine  (ATARAX ) 50 MG tablet; Take 1 tablet (50 mg total) by mouth every 6 (six) hours as needed.  Dispense: 120 tablet; Refill: 3  Follow-up in 2 months Follow-up with therapy   Zane FORBES Bach, NP 03/08/2024, 10:56 AM

## 2024-04-04 ENCOUNTER — Encounter (INDEPENDENT_AMBULATORY_CARE_PROVIDER_SITE_OTHER): Payer: Self-pay

## 2024-04-04 ENCOUNTER — Telehealth (INDEPENDENT_AMBULATORY_CARE_PROVIDER_SITE_OTHER): Payer: Self-pay

## 2024-04-04 NOTE — Telephone Encounter (Signed)
 Left the following msg on mychart:  Good afternoon.  Please call our office at your first availability., we need to reschedule your upcoming appointment on 04/17/24.  Dr Anice will not be in the office that day.  Please call us  at [phone].  Thank you!

## 2024-04-09 ENCOUNTER — Ambulatory Visit (INDEPENDENT_AMBULATORY_CARE_PROVIDER_SITE_OTHER): Admitting: Mental Health

## 2024-04-09 DIAGNOSIS — F411 Generalized anxiety disorder: Secondary | ICD-10-CM

## 2024-04-09 DIAGNOSIS — F331 Major depressive disorder, recurrent, moderate: Secondary | ICD-10-CM

## 2024-04-09 NOTE — Progress Notes (Unsigned)
 THERAPIST PROGRESS NOTE Virtual Visit via Video Note  I connected with Catherine Koch on 04/09/24 at  2:00 PM EST by a video enabled telemedicine application and verified that I am speaking with the correct person using two identifiers.  Location: Patient: home address on file Provider: remote office   I discussed the limitations of evaluation and management by telemedicine and the availability of in person appointments. The patient expressed understanding and agreed to proceed.  I discussed the assessment and treatment plan with the patient. The patient was provided an opportunity to ask questions and all were answered. The patient agreed with the plan and demonstrated an understanding of the instructions.   The patient was advised to call back or seek an in-person evaluation if the symptoms worsen or if the condition fails to improve as anticipated.  I provided 43 minutes of non-face-to-face time during this encounter.   Catherine Koch, [facility]   Session Time: 2:03 pm (   Participation Level: Active  Behavioral Response: CasualAlertDysphoric  Type of Therapy: Individual Therapy  Treatment Goals addressed:  STG: Catherine Koch will increase management of depression AEB engagement in enjoyable activity daily with ability to process through stressors in balanced manner per self report within the next 90 days.   ProgressTowards Goals: Progressing  Interventions: Supportive and Motivational interviewing  Summary:  Catherine Koch is a 48 y.o. female who presents with dx of major depression and generalized anxiety. Catherine Koch presents alert and oriented; mood and affect dysphoric. Speech clear and coherent at slow rate and  low tone. . Presents for virtual session, laying down in bed. Shares to have not been feeling well and shares ongoing difficulty with pain. Shares thoughts on current state of marriage and exploration of next best steps. Notes feelings of frustration with current  state of home; denies ability to engage in much cleaning actions due to high degree of pain when she attempts as well as low energy levels to engage in. Explores thoughts on living independently and explores ways in which she could engage in cooking and cleaning independently with difficulty with use due to pain in current situation. Notes moods to be adequate with enjoying pets and video games as means of coping and activities of interest. Denies safety concerns.   Suicidal/Homicidal: Nowithout intent/plan  Therapist Response:  Therapist engaged Catherine Koch in tele-therapy session. Confirmed location and ability to hold confidential session. Completed check in and assessed for current level of stressors and sxs management. Provided safe space for Catherine Koch to shares thoughts and feelings in regard to current stressors. Engaged in exploration of benefits and drawbacks of choices and options for needed supports. Open ended questions exploring motivation and readiness for change for reported stressor of marriage and cleaning of home. Encouraged identifying one tasks daily to support in meeting personal goals. Reviewed session and provided follow up  Plan: Return again in  x 7 weeks.  Diagnosis: Major depressive disorder, recurrent episode, moderate (HCC)  Generalized anxiety disorder  Collaboration of Care: Other [facility]  Patient/Guardian was advised Release of Information must be obtained prior to any record release in order to collaborate their care with an outside provider. Patient/Guardian was advised if they have not already done so to contact the registration department to sign all necessary forms in order for us  to release information regarding their care.   Consent: Patient/Guardian gives verbal consent for treatment and assignment of benefits for services provided during this visit. Patient/Guardian expressed understanding and agreed to proceed.  Catherine Koch [place], [facility] 04/09/2024

## 2024-04-17 ENCOUNTER — Ambulatory Visit (INDEPENDENT_AMBULATORY_CARE_PROVIDER_SITE_OTHER)

## 2024-05-01 ENCOUNTER — Encounter: Payer: Self-pay | Admitting: Cardiology

## 2024-05-01 ENCOUNTER — Ambulatory Visit: Attending: Cardiology | Admitting: Cardiology

## 2024-05-01 VITALS — BP 124/74 | HR 75 | Ht 68.0 in | Wt 369.9 lb

## 2024-05-01 DIAGNOSIS — E1169 Type 2 diabetes mellitus with other specified complication: Secondary | ICD-10-CM | POA: Insufficient documentation

## 2024-05-01 DIAGNOSIS — Z8679 Personal history of other diseases of the circulatory system: Secondary | ICD-10-CM

## 2024-05-01 DIAGNOSIS — I10 Essential (primary) hypertension: Secondary | ICD-10-CM | POA: Insufficient documentation

## 2024-05-01 DIAGNOSIS — R0609 Other forms of dyspnea: Secondary | ICD-10-CM | POA: Insufficient documentation

## 2024-05-01 DIAGNOSIS — R072 Precordial pain: Secondary | ICD-10-CM | POA: Insufficient documentation

## 2024-05-01 DIAGNOSIS — G4733 Obstructive sleep apnea (adult) (pediatric): Secondary | ICD-10-CM

## 2024-05-01 MED ORDER — METOPROLOL TARTRATE 100 MG PO TABS: 100.0000 mg | ORAL_TABLET | Freq: Once | ORAL | 0 refills | Status: AC

## 2024-05-01 NOTE — Progress Notes (Unsigned)
  Cardiology Office Note:  .   Date:  05/01/2024  ID:  Catherine Koch, DOB 1975-10-10, MRN [id number] PCP: Practice, [facility] Providers Cardiologist:  None { Click to update primary MD,subspecialty MD or APP then REFRESH:1}    No chief complaint on file.   Patient Profile: .     Catherine Koch is a *** 48 y.o. female *** with a PMH notable for *** who presents here for *** at the request of Imogene Toribio SAILOR, FNP.  {There is no content from the last Narrative History section.}      Catherine Koch was last seen on ***  Subjective  Discussed the use of AI scribe software for clinical note transcription with the patient, who gave verbal consent to proceed.  History of Present Illness      Cardiovascular ROS: {roscv:310661}  ROS:  Review of Systems - {ros master:310782}    Objective    Studies Reviewed: .        Results  ECHO: *** CATH: *** MONITOR: *** CT: ***  Risk Assessment/Calculations:   {Does this patient have ATRIAL FIBRILLATION?:[id number]}           Physical Exam:   VS:  BP 124/74   Pulse 75   Ht 5' 8 (1.727 m)   Wt (!) 369 lb 14.4 oz (167.8 kg)   SpO2 96%   BMI 56.24 kg/m    Wt Readings from Last 3 Encounters:  05/01/24 (!) 369 lb 14.4 oz (167.8 kg)  02/14/24 (!) 370 lb (167.8 kg)  11/01/23 (!) 360 lb (163.3 kg)    Physical Exam    GEN: Well nourished, well developed in no acute distress; *** NECK: No JVD; No carotid bruits CARDIAC: Normal S1, S2; RRR, no murmurs, rubs, gallops RESPIRATORY:  Clear to auscultation without rales, wheezing or rhonchi ; nonlabored, good air movement. ABDOMEN: Soft, non-tender, non-distended EXTREMITIES:  No edema; No deformity      ASSESSMENT AND PLAN: .    Problem List Items Addressed This Visit       Cardiology Problems   Hyperlipidemia associated with type 2 diabetes mellitus (HCC) (Chronic)   Primary hypertension (Chronic)     Other    DOE (dyspnea on exertion) (Chronic)   Morbid obesity (HCC) (Chronic)   Relevant Medications   ZEPBOUND 2.5 MG/0.5ML Pen   Obstructive sleep apnea (Chronic)   Precordial pain   Other Visit Diagnoses       Personal history of other diseases of the circulatory system    -  Primary   Relevant Orders   EKG 12-Lead (Completed)       Assessment and Plan Assessment & Plan        {Are you ordering a CV Procedure (e.g. stress test, cath, DCCV, TEE, etc)?   Press F2        :[id number]}   Follow-Up: No follow-ups on file.  I spent *** minutes in the care of Catherine Koch today including {CHL AMB CAR Time Based Billing Options STW (Optional):[phone]::documenting in the encounter.}      Signed, Alm MICAEL Clay, MD, MS Alm Clay, M.D., M.S. Interventional Cardiologist  [facility] Pager # [phone]

## 2024-05-01 NOTE — Patient Instructions (Addendum)
 Medication Instructions:    See below  one time dose of Metoprolol  100 mg *If you need a refill on your cardiac medications before your next appointment, please call your pharmacy*   Lab Work: BMP  If you have labs (blood work) drawn today and your tests are completely normal, you will receive your results only by: MyChart Message (if you have MyChart) OR A paper copy in the mail If you have any lab test that is abnormal or we need to change your treatment, we will call you to review the results.   Testing/Procedures:  1) Your physician has requested that you have an echocardiogram. Echocardiography is a painless test that uses sound waves to create images of your heart. It provides your doctor with information about the size and shape of your heart and how well your heart's chambers and valves are working. This procedure takes approximately one hour. There are no restrictions for this procedure. Please do NOT wear cologne, perfume, aftershave, or lotions (deodorant is allowed). Please arrive 15 minutes prior to your appointment time.  Please note: We ask at that you not bring children with you during ultrasound (echo/ vascular) testing. Due to room size and safety concerns, children are not allowed in the ultrasound rooms during exams. Our front office staff cannot provide observation of children in our lobby area while testing is being conducted. An adult accompanying a patient to their appointment will only be allowed in the ultrasound room at the discretion of the ultrasound technician under special circumstances. We apologize for any inconvenience. 2) Your physician has requested that you have coronary  CTA. Coronary computed tomography (CT)angiogram  is a special type of CT scan that uses a computer to produce multi-dimensional views of major blood vessels throughout the heart.  CT angiography, a contrast material is injected through an IV to help visualize the blood vessels  a painless  test that uses an x-ray machine to take clear, detailed pictures of your heart arteries .  Please follow instruction sheet as given.   Follow-Up: At [facility], you and your health needs are our priority.  As part of our continuing mission to provide you with exceptional heart care, we have created designated Provider Care Teams.  These Care Teams include your primary Cardiologist (physician) and Advanced Practice Providers (APPs -  Physician Assistants and Nurse Practitioners) who all work together to provide you with the care you need, when you need it.     Your next appointment:   2 month(s)  The format for your next appointment:   In Person  Provider:   Aline Door, PA-C, Damien Braver, NP, or Katlyn West, NP      Then, Alm Clay, MD will plan to see you again in 4 month(s).   Other Instructions    Your cardiac CT will be scheduled at the below locations:      [facility] [address]  [place], [place] [address]    If scheduled at the [facility] at [address], please enter the parking lot using the [address] entrance and use the FREE valet service at the patient drop-off area. Enter the building and check-in with registration on the main floor.    Please follow these instructions carefully (unless otherwise directed):  An IV will be required for this test and Nitroglycerin will be given.    On the Night Before the Test: Be sure to Drink plenty of water. Do not  consume any caffeinated/decaffeinated beverages or chocolate 12 hours prior to your test. Do not take any antihistamines 12 hours prior to your test.   On the Day of the Test: Drink plenty of water until 1 hour prior to the test. Do not eat any food 1 hour prior to test. You may take your regular medications prior to the test.  Take metoprolol  (Lopressor )  100 mg two hours prior to test. ( Do Not regular dose of Metoprolol  25 mg)  If you take  Spironolactone, please HOLD on the morning of the test. Patients who wear a continuous glucose monitor MUST remove the device prior to scanning. FEMALES- please wear underwire-free bra if available, avoid dresses & tight clothing          After the Test: Drink plenty of water. After receiving IV contrast, you may experience a mild flushed feeling. This is normal. On occasion, you may experience a mild rash up to 24 hours after the test. This is not dangerous. If this occurs, you can take Benadryl  25 mg, Zyrtec, Claritin, or Allegra and increase your fluid intake. (Patients taking Tikosyn should avoid Benadryl , and may take Zyrtec, Claritin, or Allegra) If you experience trouble breathing, this can be serious. If it is severe call 911 IMMEDIATELY. If it is mild, please call our office.  We will call to schedule your test 2-4 weeks out understanding that some insurance companies will need an authorization prior to the service being performed.   For more information and frequently asked questions, please visit our website : [email or web address]  For non-scheduling related questions, please contact the cardiac imaging nurse navigator should you have any questions/concerns: Cardiac Imaging Nurse Navigators Direct Office Dial: [phone]   For scheduling needs, including cancellations and rescheduling, please call Brittany, [phone].

## 2024-05-02 ENCOUNTER — Encounter: Payer: Self-pay | Admitting: Cardiology

## 2024-05-02 ENCOUNTER — Encounter (INDEPENDENT_AMBULATORY_CARE_PROVIDER_SITE_OTHER): Payer: Self-pay

## 2024-05-02 ENCOUNTER — Ambulatory Visit (INDEPENDENT_AMBULATORY_CARE_PROVIDER_SITE_OTHER)

## 2024-05-02 VITALS — BP 107/72 | HR 78 | Temp 98.0°F | Wt 369.0 lb

## 2024-05-02 DIAGNOSIS — K219 Gastro-esophageal reflux disease without esophagitis: Secondary | ICD-10-CM

## 2024-05-02 DIAGNOSIS — R490 Dysphonia: Secondary | ICD-10-CM

## 2024-05-02 DIAGNOSIS — J370 Chronic laryngitis: Secondary | ICD-10-CM

## 2024-05-02 DIAGNOSIS — G4733 Obstructive sleep apnea (adult) (pediatric): Secondary | ICD-10-CM

## 2024-05-02 NOTE — Assessment & Plan Note (Signed)
 Significant weight gain contributing to cardiovascular risk factors. Weight loss is a goal to improve overall health and reduce cardiovascular risk. Discussed the benefits of weight loss on blood pressure, blood sugar, and sleep apnea.

## 2024-05-02 NOTE — Assessment & Plan Note (Addendum)
 The presence of chest discomfort that is with without exertion in a patient with significant cardiac risk factors warrants further evaluation and risk ratification.   Intermittent chest tightness and shortness of breath with exertion. Differential includes coronary artery disease and heart failure. Echocardiogram to assess cardiac function and rule out heart failure. CT angiography to evaluate for coronary artery disease and plaque buildup. Discussed the noninvasive nature of CT angiography as an alternative to heart catheterization. - Ordered echocardiogram to assess cardiac function. - Ordered CTA to evaluate coronary arteries. - Ordered chemistry panel to assess for Coronary CTA

## 2024-05-02 NOTE — Assessment & Plan Note (Signed)
 Managed with CPAP. Weight loss is a goal to reduce severity and improve symptoms. Discussed the role of weight loss in reducing sleep apnea and associated cardiovascular risks.

## 2024-05-02 NOTE — Assessment & Plan Note (Signed)
 Hypertension managed with metoprolol  tartrate 25 mg twice daily and spironolactone 25 mg daily.  Blood pressure is well-controlled. Discussed the importance of weight loss in managing blood pressure. -Continue current meds for now.

## 2024-05-02 NOTE — Assessment & Plan Note (Signed)
 Total cholesterol 180 with LDL of 102 ventricular tries 86 on WelChol . - Further risk stratify with Coronary CTA  Prediabetes  managed with metformin and Zepbound. A1c is around 6.5-6.7, indicating suboptimal control. Weight loss is expected to improve glycemic control. Discussed the role of Zepbound in weight loss and its impact on blood sugar levels.

## 2024-05-02 NOTE — Assessment & Plan Note (Signed)
 Precordial pain and exertional dyspnea Intermittent chest tightness and shortness of breath with exertion. Differential includes coronary artery disease and heart failure. Echocardiogram to assess cardiac function and rule out heart failure. CT angiography to evaluate for coronary artery disease and plaque buildup. Discussed the noninvasive nature of CT angiography as an alternative to heart catheterization. - Ordered echocardiogram to assess cardiac function. - Ordered CT angiography to evaluate coronary arteries. - Ordered chemistry panel to assess for CT angiography eligibility.

## 2024-05-02 NOTE — Progress Notes (Signed)
 Dear Dr. Deidre, Here is my assessment for our mutual patient, Catherine Koch. Thank you for allowing me the opportunity to care for your patient. Please do not hesitate to contact me should you have any other questions. Sincerely, Dr. Penne Croak  Otolaryngology Clinic Note Referring provider: Dr. Deidre HPI:  Discussed the use of AI scribe software for clinical note transcription with the patient, who gave verbal consent to proceed.  History of Present Illness Catherine Koch is a 48 year old female with GERD and sleep apnea who presents with upper respiratory infection symptoms.  Upper respiratory tract symptoms - Experiencing symptoms consistent with an upper respiratory infection, which is common for her during this time of year - Family members also became ill, but she was the only one who did not initially - Currently on antibiotics with improvement in symptoms  Gastroesophageal reflux disease (gerd) and cough - GERD managed with omeprazole  twice daily - Increased omeprazole  dosage has led to some improvement in symptoms, including reduced frequency of cough - Persistent morning cough  Medication side effects and appetite changes - Started Zepbound last Friday - Initial nausea after starting Zepbound, now improving - Decreased appetite and increased preference for healthier foods - Medication is expensive, but partially covered by insurance  Obstructive sleep apnea and cpap use - Diagnosed with obstructive sleep apnea - Uses CPAP machine nightly - Weight gain began after sleep apnea diagnosis - Desires weight loss to potentially reduce CPAP pressure and dependence   PMH/Meds/All/SocHx/FamHx/ROS:   Past Medical History:  Diagnosis Date   ADHD    Anxiety    Arthritis    pt. states in knees   Chronic gastritis    Chronic sinus infection    Closed fracture of right distal fibula 11/19/2013   Complication of anesthesia    pt. states difficult to wake up    Depression    GERD (gastroesophageal reflux disease)    Headache    pt. states random migraines   Hemorrhoids    High cholesterol    History of bronchitis    History of degenerative disc disease    Hypertension    Ovarian failure    PTSD (post-traumatic stress disorder)    Shortness of breath    exertion from crutches   Sleep apnea    cpap   UTI (lower urinary tract infection)    Wears glasses      Past Surgical History:  Procedure Laterality Date   BIOPSY  12/10/2019   Procedure: BIOPSY;  Surgeon: Shila Gustav GAILS, MD;  Location: [facility];  Service: Endoscopy;;   CHOLECYSTECTOMY  2010   CHOLECYSTECTOMY     COLONOSCOPY WITH PROPOFOL  N/A 12/10/2019   Procedure: COLONOSCOPY WITH PROPOFOL ;  Surgeon: Shila Gustav GAILS, MD;  Location: [facility];  Service: Endoscopy;  Laterality: N/A;   ESOPHAGOGASTRODUODENOSCOPY     ESOPHAGOGASTRODUODENOSCOPY (EGD) WITH PROPOFOL  N/A 12/10/2019   Procedure: ESOPHAGOGASTRODUODENOSCOPY (EGD) WITH PROPOFOL ;  Surgeon: Shila Gustav GAILS, MD;  Location: [facility];  Service: Endoscopy;  Laterality: N/A;   NASAL SEPTOPLASTY W/ TURBINOPLASTY Bilateral 12/11/2014   Procedure: NASAL SEPTOPLASTY AND BILATERAL INFERIOR TURBINATE REDUCTION;  Surgeon: Alm Bouche, MD;  Location: [facility];  Service: ENT;  Laterality: Bilateral;   ORIF FIBULA FRACTURE Right 11/19/2013   Procedure: OPEN REDUCTION INTERNAL FIXATION (ORIF) RIGHT FIBULA FRACTURE;  Surgeon: Fonda SHAUNNA Olmsted, MD;  Location: [facility] OR;  Service: Orthopedics;  Laterality: Right;   WISDOM TOOTH EXTRACTION      Family History  Problem  Relation Age of Onset   Depression Sister    Allergies Sister    Cancer Paternal Grandfather    Heart disease Paternal Grandmother    Cancer Paternal Grandmother        BREAST CANCER     Social Connections: Moderately Isolated (09/20/2023)   Social Connection and Isolation Panel    Frequency of Communication with Friends and Family: Three times a week     Frequency of Social Gatherings with Friends and Family: Never    Attends Religious Services: Never    Database Administrator or Organizations: No    Attends Banker Meetings: Never    Marital Status: Married      Current Outpatient Medications:    ARIPiprazole  (ABILIFY ) 5 MG tablet, Take 1 tablet (5 mg total) by mouth daily., Disp: 30 tablet, Rfl: 2   benzonatate  (TESSALON ) 100 MG capsule, Take 1-2 capsules (100-200 mg total) by mouth 3 (three) times daily as needed., Disp: 30 capsule, Rfl: 0   colesevelam  (WELCHOL ) 625 MG tablet, Take 1 tablet (625 mg total) by mouth daily with breakfast., Disp: 90 tablet, Rfl: 3   DULoxetine  (CYMBALTA ) 60 MG capsule, Take 2 capsules (120 mg total) by mouth daily., Disp: 60 capsule, Rfl: 3   hydrOXYzine  (ATARAX ) 50 MG tablet, Take 1 tablet (50 mg total) by mouth every 6 (six) hours as needed., Disp: 120 tablet, Rfl: 3   ibuprofen  (ADVIL ) 800 MG tablet, Take 1 tablet (800 mg total) by mouth every 6 (six) hours as needed for moderate pain (pain score 4-6)., Disp: 20 tablet, Rfl: 0   lidocaine  (XYLOCAINE ) 2 % solution, Use as directed 10 mLs in the mouth or throat every 3 (three) hours as needed for mouth pain., Disp: 100 mL, Rfl: 0   metFORMIN (GLUMETZA) 500 MG (MOD) 24 hr tablet, Take 500 mg by mouth daily with breakfast., Disp: , Rfl:    metoprolol  tartrate (LOPRESSOR ) 100 MG tablet, Take 1 tablet (100 mg total) by mouth once for 1 dose. TAKE TWO HOURS PRIOR TO  SCHEDULE CARDIAC TEST, Disp: 1 tablet, Rfl: 0   metoprolol  tartrate (LOPRESSOR ) 25 MG tablet, Take 25 mg by mouth 2 (two) times daily., Disp: , Rfl:    naproxen  (NAPROSYN ) 500 MG tablet, Take 1 tablet (500 mg total) by mouth 2 (two) times daily., Disp: 30 tablet, Rfl: 0   nystatin-triamcinolone ointment (MYCOLOG), APPLY OINTMENT TOPICALLY TO AFFECTED AREA TWICE DAILY, Disp: , Rfl:    omeprazole  (PRILOSEC) 40 MG capsule, Take 1 capsule (40 mg total) by mouth 2 (two) times daily before lunch  and supper., Disp: 90 capsule, Rfl: 0   pregabalin (LYRICA) 50 MG capsule, Take 50 mg by mouth 2 (two) times daily., Disp: , Rfl:    spironolactone (ALDACTONE) 25 MG tablet, Take 25 mg by mouth daily., Disp: , Rfl:    Turmeric (QC TUMERIC COMPLEX PO), Take 1 tablet by mouth daily., Disp: , Rfl:    ZEPBOUND 2.5 MG/0.5ML Pen, Inject 2.5 mg into the skin once a week., Disp: , Rfl:    atorvastatin (LIPITOR) 10 MG tablet, Take 10 mg by mouth daily., Disp: , Rfl:    levocetirizine (XYZAL ALLERGY 24HR) 5 MG tablet, , Disp: , Rfl:    Physical Exam:   BP 107/72 (BP Location: Right Arm, Patient Position: Sitting, Cuff Size: Normal)   Pulse 78   Temp 98 F (36.7 C)   Wt (!) 369 lb (167.4 kg)   SpO2 97%   BMI  56.11 kg/m   The patient was awake, alert, and appropriate. The external ears were inspected, and otoscopy was performed to evaluate the external auditory canals and tympanic membranes. The nasal cavity and septum were examined for mucosal changes, obstruction, or discharge. The oral cavity and oropharynx were inspected for mucosal lesions, infection, or tonsillar hypertrophy. The neck was palpated for lymphadenopathy, thyroid abnormalities, or other masses. Cranial nerve function was grossly intact.  Pertinent Findings: Physical Exam HEENT: Atraumatic, normocephalic. Tympanic membranes with normal landmarks bilaterally. Throat normal. BP 107/72 (BP Location: Right Arm, Patient Position: Sitting, Cuff Size: Normal)   Pulse 78   Temp 98 F (36.7 C)   Wt (!) 369 lb (167.4 kg)   SpO2 97%   BMI 56.11 kg/m  General: Well developed, well nourished. No acute distress. Voice clear Head/Face: Normocephalic. No sinus tenderness. Facial nerve intact and equal bilaterally. No facial lacerations. Eyes: PERRL, no scleral icterus or conjunctival hemorrhage. EOMI. Ears: No gross deformity. Normal external canal. Tympanic membrane clear bilaterally Hearing: Normal speech reception.  Nose: No gross  deformity or lesions. No purulent discharge. No turbinate hypertrophy. Mouth/Oropharynx: Lips without any lesions. Dentition fair. No mucosal lesions within the oropharynx. No tonsillar enlargement, exudate, or lesions. Pharyngeal walls symmetrical. Uvula midline. Tongue midline without lesions. Larynx: See TFL if applicable Nasopharynx: See TFL if applicable Neck: Trachea midline. No masses. No thyromegaly or nodules palpated. No crepitus. Lymphatic: No lymphadenopathy in the neck. Respiratory: No stridor or distress. Room air. Cardiovascular: Regular rate and rhythm. Extremities: No edema or cyanosis. Warm and well-perfused. Skin: No scars or lesions on face or neck. Neurologic: CN II-XII grossly intact. Moving all extremities without gross abnormality. Other:    Impression & Plans:  Carry Weesner is a 48 y.o. female  No diagnosis found.  - Findings and diagnoses discussed in detail with the patient. - Risks, benefits, and alternatives were reviewed. Through shared decision making, the patient elects to proceed with below.  Assessment and Plan Assessment & Plan Laryngopharyngeal reflux Symptoms improved with increased omeprazole , some morning cough persists. - Alternate omeprazole  dosage: two tablets one day, one tablet the next day for a couple of weeks. - Gradually reduce omeprazole  to once daily as tolerated. - also eating much cleaner, continue  Chronic laryngopharyngitis Symptoms improved with current treatment regimen.  Obstructive sleep apnea Managed with CPAP. Weight loss from Zepbound may reduce CPAP pressure requirements. Large tongue may contribute. - Continue CPAP therapy. - Monitor weight loss progress and adjust CPAP pressure as needed.   - Orders placed: No orders of the defined types were placed in this encounter.  - Medications prescribed/continued/adjusted: No orders of the defined types were placed in this encounter.  - Education materials provided to  the patient. - Follow up: 6 months. Patient instructed to return sooner or go to the ED if new/worsening symptoms develop.  I spent 32 minutes (exclusive of separately billable procedures) on the day of the encounter reviewing the patient's chart, seeing the patient face to face, discussing the findings and the treatment plan, and documenting in the EHR.    Thank you for allowing me the opportunity to care for your patient. Please do not hesitate to contact me should you have any other questions.  Sincerely, Penne Croak, DO Otolaryngologist (ENT) [facility] Phone: [phone] Fax: [phone]  05/02/2024, 11:46 PM

## 2024-05-03 ENCOUNTER — Ambulatory Visit: Payer: Self-pay | Admitting: Cardiology

## 2024-05-03 LAB — BASIC METABOLIC PANEL WITH GFR
BUN/Creatinine Ratio: 12 (ref 9–23)
BUN: 15 mg/dL (ref 6–24)
CO2: 22 mmol/L (ref 20–29)
Calcium: 9.9 mg/dL (ref 8.7–10.2)
Chloride: 102 mmol/L (ref 96–106)
Creatinine, Ser: 1.25 mg/dL — ABNORMAL HIGH (ref 0.57–1.00)
Glucose: 83 mg/dL (ref 70–99)
Potassium: 4 mmol/L (ref 3.5–5.2)
Sodium: 141 mmol/L (ref 134–144)
eGFR: 54 mL/min/1.73 — ABNORMAL LOW (ref >59)

## 2024-05-21 ENCOUNTER — Telehealth (HOSPITAL_COMMUNITY): Payer: Self-pay | Admitting: *Deleted

## 2024-05-21 NOTE — Telephone Encounter (Signed)
 Attempted to call patient regarding upcoming cardiac CT appointment. Left message on voicemail with name and callback number [name] RN Navigator Cardiac Imaging [facility] [phone] Office

## 2024-05-22 ENCOUNTER — Ambulatory Visit (HOSPITAL_COMMUNITY)
Admission: RE | Admit: 2024-05-22 | Discharge: 2024-05-22 | Disposition: A | Discharge status: Home / Self Care | Source: Ambulatory Visit | Attending: Cardiology | Admitting: Cardiology

## 2024-05-22 DIAGNOSIS — G4733 Obstructive sleep apnea (adult) (pediatric): Secondary | ICD-10-CM | POA: Insufficient documentation

## 2024-05-22 DIAGNOSIS — R072 Precordial pain: Secondary | ICD-10-CM | POA: Diagnosis present

## 2024-05-22 DIAGNOSIS — I10 Essential (primary) hypertension: Secondary | ICD-10-CM | POA: Insufficient documentation

## 2024-05-22 DIAGNOSIS — Z8679 Personal history of other diseases of the circulatory system: Secondary | ICD-10-CM | POA: Diagnosis present

## 2024-05-22 DIAGNOSIS — R0609 Other forms of dyspnea: Secondary | ICD-10-CM | POA: Diagnosis present

## 2024-05-22 DIAGNOSIS — E785 Hyperlipidemia, unspecified: Secondary | ICD-10-CM | POA: Insufficient documentation

## 2024-05-22 DIAGNOSIS — E1169 Type 2 diabetes mellitus with other specified complication: Secondary | ICD-10-CM | POA: Insufficient documentation

## 2024-05-22 MED ORDER — NITROGLYCERIN 0.4 MG SL SUBL
0.8000 mg | SUBLINGUAL_TABLET | Freq: Once | SUBLINGUAL | Status: AC
  Administered 2024-05-22: 0.8 mg via SUBLINGUAL

## 2024-05-22 MED ORDER — IOHEXOL 350 MG/ML SOLN
150.0000 mL | Freq: Once | INTRAVENOUS | Status: AC | PRN
  Administered 2024-05-22: 150 mL via INTRAVENOUS

## 2024-05-22 MED ORDER — IOHEXOL 350 MG/ML SOLN: 100.0000 mL | Freq: Once | INTRAVENOUS | Status: DC | PRN

## 2024-05-29 ENCOUNTER — Telehealth (HOSPITAL_COMMUNITY): Admitting: Psychiatry

## 2024-05-29 ENCOUNTER — Encounter (HOSPITAL_COMMUNITY): Payer: Self-pay | Admitting: Psychiatry

## 2024-05-29 DIAGNOSIS — F411 Generalized anxiety disorder: Secondary | ICD-10-CM | POA: Diagnosis not present

## 2024-05-29 DIAGNOSIS — F331 Major depressive disorder, recurrent, moderate: Secondary | ICD-10-CM

## 2024-05-29 MED ORDER — HYDROXYZINE HCL 50 MG PO TABS: 50.0000 mg | ORAL_TABLET | Freq: Four times a day (QID) | ORAL | 3 refills | Status: AC | PRN

## 2024-05-29 MED ORDER — DULOXETINE HCL 60 MG PO CPEP: 120.0000 mg | ORAL_CAPSULE | Freq: Every day | ORAL | 3 refills | Status: AC

## 2024-05-29 NOTE — Progress Notes (Signed)
 BH MD/PA/NP OP Progress Note Virtual Visit via Video Note  I connected with [name] on 05/29/2024 at 12:30 PM EST by a video enabled telemedicine application and verified that I am speaking with the correct person using two identifiers.  Location: Patient: Catherine Koch: Clinic   I discussed the limitations of evaluation and management by telemedicine and the availability of in person appointments. The patient expressed understanding and agreed to proceed.  I provided 30 minutes of non-face-to-face time during this encounter.    05/29/2024 12:55 PM Catherine Koch  MRN:  [id number]  Chief Complaint: I have not needed Abilify   HPI: 49 year old female seen today  for follow-up psychiatric evaluation. She has a history of auditory processing disorder (as a child), depression, PTSD,  and anxiety, and is currently managed on Abilify  5 daily, Hydroxyzine  50mg  four times daily as needed and Cymbalta  120 mg daily. She informed clinical research associate that she has not restarted Abilify  and reports that her other medications  are effective in managing her psychiatric conditions.    Today she was well groomed, pleasant, cooperative, and engaged in conversation. She informed clinical research associate that she has not needed her Abilify . She notes that Ozempic has regulated her mood. She also notes that she has more energy. Since her last visit she notes that her anxiety and depression has improved. Today Koch conducted a GAD-7 and patient scored a 14, at her last visit she scored a 20.  Koch also conducted PHQ-9 and patient scored a 16, at her last visit she scored 19.  She endorses fluctuation in sleep and adequate appetite.  Today she denies SI/HI/VAH, mania, or paranoia.  Patient continues to have pain associated with her fibromyalgia. She quantifies her pain as 5/10. She notes that her medications are somewhat effective in managing her pain. She also notes that she takes Formula 303 herbal supplements and edible Gummies  to help manage her pain.    Patient reports that she and her husband are on their way to [place]  to spend time with family.  She reports that she was unable to spend the Christmas holiday with them as she was sick.  At this time Abilify  not restarted.  Koch informed patient that Abilify  could be restarted in the future if needed.  She will continue her other med history as prescribed.  No other concerns at this time.    Visit Diagnosis:    ICD-10-CM   1. Major depressive disorder, recurrent episode, moderate (HCC)  F33.1 DULoxetine  (CYMBALTA ) 60 MG capsule    hydrOXYzine  (ATARAX ) 50 MG tablet    2. Generalized anxiety disorder  F41.1 hydrOXYzine  (ATARAX ) 50 MG tablet             Past Psychiatric History: Auditory processing disorder (as a child), PTSD, anxiety, and depression  Past Medical History:  Past Medical History:  Diagnosis Date   ADHD    Anxiety    Arthritis    pt. states in knees   Chronic gastritis    Chronic sinus infection    Closed fracture of right distal fibula 11/19/2013   Complication of anesthesia    pt. states difficult to wake up   Depression    GERD (gastroesophageal reflux disease)    Headache    pt. states random migraines   Hemorrhoids    High cholesterol    History of bronchitis    History of degenerative disc disease    Hypertension    Ovarian failure    PTSD (  post-traumatic stress disorder)    Shortness of breath    exertion from crutches   Sleep apnea    cpap   UTI (lower urinary tract infection)    Wears glasses     Past Surgical History:  Procedure Laterality Date   BIOPSY  12/10/2019   Procedure: BIOPSY;  Surgeon: Shila Gustav GAILS, MD;  Location: [facility];  Service: Endoscopy;;   CHOLECYSTECTOMY  2010   CHOLECYSTECTOMY     COLONOSCOPY WITH PROPOFOL  N/A 12/10/2019   Procedure: COLONOSCOPY WITH PROPOFOL ;  Surgeon: Shila Gustav GAILS, MD;  Location: [facility];  Service: Endoscopy;  Laterality: N/A;    ESOPHAGOGASTRODUODENOSCOPY     ESOPHAGOGASTRODUODENOSCOPY (EGD) WITH PROPOFOL  N/A 12/10/2019   Procedure: ESOPHAGOGASTRODUODENOSCOPY (EGD) WITH PROPOFOL ;  Surgeon: Shila Gustav GAILS, MD;  Location: [facility];  Service: Endoscopy;  Laterality: N/A;   NASAL SEPTOPLASTY W/ TURBINOPLASTY Bilateral 12/11/2014   Procedure: NASAL SEPTOPLASTY AND BILATERAL INFERIOR TURBINATE REDUCTION;  Surgeon: Alm Bouche, MD;  Location: [facility] OR;  Service: ENT;  Laterality: Bilateral;   ORIF FIBULA FRACTURE Right 11/19/2013   Procedure: OPEN REDUCTION INTERNAL FIXATION (ORIF) RIGHT FIBULA FRACTURE;  Surgeon: Fonda SHAUNNA Olmsted, MD;  Location: [facility] OR;  Service: Orthopedics;  Laterality: Right;   WISDOM TOOTH EXTRACTION      Family Psychiatric History: Sister anxiety and depression. Notes mother has untreated mental health conditions  Family History:  Family History  Problem Relation Age of Onset   Depression Sister    Allergies Sister    Cancer Paternal Grandfather    Heart disease Paternal Grandmother    Cancer Paternal Grandmother        BREAST CANCER    Social History:  Social History   Socioeconomic History   Marital status: Married    Spouse name: Not on file   Number of children: 0   Years of education: Not on file   Highest education level: Some college, no degree  Occupational History    Comment: UNEMPOLYEED   Tobacco Use   Smoking status: Former    Current packs/day: 0.00    Types: Cigarettes    Quit date: 05/24/2010    Years since quitting: 14.0    Passive exposure: Never   Smokeless tobacco: Never   Tobacco comments:    Pt. states she used to be a social smoker  Vaping Use   Vaping status: Never Used  Substance and Sexual Activity   Alcohol use: Yes   Drug use: No   Sexual activity: Yes    Birth control/protection: None  Other Topics Concern   Not on file  Social History Narrative   Not on file   Social Drivers of Health   Tobacco Use: Medium Risk (05/02/2024)   Patient  History    Smoking Tobacco Use: Former    Smokeless Tobacco Use: Never    Passive Exposure: Never  Physicist, Medical Strain: High Risk (01/06/2022)   Overall Financial Resource Strain (CARDIA)    Difficulty of Paying Living Expenses: Hard  Food Insecurity: Food Insecurity Present (01/06/2022)   Hunger Vital Sign    Worried About Running Out of Food in the Last Year: Sometimes true    Ran Out of Food in the Last Year: Sometimes true  Transportation Needs: No Transportation Needs (01/06/2022)   PRAPARE - Administrator, Civil Service (Medical): No    Lack of Transportation (Non-Medical): No  Physical Activity: Inactive (01/06/2022)   Exercise Vital Sign    Days of Exercise per Week:  0 days    Minutes of Exercise per Session: 0 min  Stress: Stress Concern Present (01/06/2022)   [employer] of Occupational Health - Occupational Stress Questionnaire    Feeling of Stress : Very much  Social Connections: Moderately Isolated (09/20/2023)   Social Connection and Isolation Panel    Frequency of Communication with Friends and Family: Three times a week    Frequency of Social Gatherings with Friends and Family: Never    Attends Religious Services: Never    Database Administrator or Organizations: No    Attends Banker Meetings: Never    Marital Status: Married  Depression (PHQ2-9): High Risk (05/29/2024)   Depression (PHQ2-9)    PHQ-2 Score: 16  Alcohol Screen: Low Risk (01/06/2022)   Alcohol Screen    Last Alcohol Screening Score (AUDIT): 1  Housing: High Risk (01/06/2022)   Housing    Last Housing Risk Score: 2  Utilities: Not At Risk (09/20/2023)   AHC Utilities    Threatened with loss of utilities: No  Health Literacy: Adequate Health Literacy (09/20/2023)   B1300 Health Literacy    Frequency of need for help with medical instructions: Never    Allergies:  Allergies  Allergen Reactions   Bee Pollen Anaphylaxis, Swelling, Hives and Itching    Other  reaction(s): eye redness   Hydrolyzed Silk Hives, Itching and Swelling   Sulfa Antibiotics Anaphylaxis, Swelling and Rash    Throat swelling/ rashes    Metabolic Disorder Labs: No results found for: HGBA1C, MPG No results found for: PROLACTIN No results found for: CHOL, TRIG, HDL, CHOLHDL, VLDL, LDLCALC Lab Results  Component Value Date   TSH 1.90 04/11/2012    Therapeutic Level Labs: No results found for: LITHIUM No results found for: VALPROATE No results found for: CBMZ  Current Medications: Current Outpatient Medications  Medication Sig Dispense Refill   atorvastatin (LIPITOR) 10 MG tablet Take 10 mg by mouth daily.     benzonatate  (TESSALON ) 100 MG capsule Take 1-2 capsules (100-200 mg total) by mouth 3 (three) times daily as needed. 30 capsule 0   colesevelam  (WELCHOL ) 625 MG tablet Take 1 tablet (625 mg total) by mouth daily with breakfast. 90 tablet 3   DULoxetine  (CYMBALTA ) 60 MG capsule Take 2 capsules (120 mg total) by mouth daily. 60 capsule 3   hydrOXYzine  (ATARAX ) 50 MG tablet Take 1 tablet (50 mg total) by mouth every 6 (six) hours as needed. 120 tablet 3   ibuprofen  (ADVIL ) 800 MG tablet Take 1 tablet (800 mg total) by mouth every 6 (six) hours as needed for moderate pain (pain score 4-6). 20 tablet 0   levocetirizine (XYZAL ALLERGY 24HR) 5 MG tablet      lidocaine  (XYLOCAINE ) 2 % solution Use as directed 10 mLs in the mouth or throat every 3 (three) hours as needed for mouth pain. 100 mL 0   metFORMIN (GLUMETZA) 500 MG (MOD) 24 hr tablet Take 500 mg by mouth daily with breakfast.     metoprolol  tartrate (LOPRESSOR ) 100 MG tablet Take 1 tablet (100 mg total) by mouth once for 1 dose. TAKE TWO HOURS PRIOR TO  SCHEDULE CARDIAC TEST 1 tablet 0   metoprolol  tartrate (LOPRESSOR ) 25 MG tablet Take 25 mg by mouth 2 (two) times daily.     naproxen  (NAPROSYN ) 500 MG tablet Take 1 tablet (500 mg total) by mouth 2 (two) times daily. 30 tablet 0    nystatin-triamcinolone ointment (MYCOLOG) APPLY OINTMENT TOPICALLY TO AFFECTED AREA  TWICE DAILY     omeprazole  (PRILOSEC) 40 MG capsule Take 1 capsule (40 mg total) by mouth 2 (two) times daily before lunch and supper. 90 capsule 0   pregabalin (LYRICA) 50 MG capsule Take 50 mg by mouth 2 (two) times daily.     spironolactone (ALDACTONE) 25 MG tablet Take 25 mg by mouth daily.     Turmeric (QC TUMERIC COMPLEX PO) Take 1 tablet by mouth daily.     ZEPBOUND 2.5 MG/0.5ML Pen Inject 2.5 mg into the skin once a week.     No current facility-administered medications for this visit.     Musculoskeletal: Strength & Muscle Tone: within normal limits and telehealth visit Gait & Station: normal, telehealth visit Patient leans: N/A  Psychiatric Specialty Exam: Review of Systems  There were no vitals taken for this visit.There is no height or weight on file to calculate BMI.  General Appearance: Well Groomed  Eye Contact:  Good  Speech:  Clear and Coherent and Normal Rate  Volume:  Normal  Mood:  Anxious and Depressed, improving  Affect:  Congruent  Thought Process:  Coherent, Goal Directed and Linear  Orientation:  Full (Time, Place, and Person)  Thought Content: WDL and Logical   Suicidal Thoughts:  No  Homicidal Thoughts:  No  Memory:  Immediate;   Good Recent;   Good Remote;   Good  Judgement:  Good  Insight:  Good  Psychomotor Activity:  Normal  Concentration:  Concentration: Fair and Attention Span: Fair  Recall:  Good  Fund of Knowledge: Good  Language: Good  Akathisia:  No  Handed:  Right  AIMS (if indicated): Not done  Assets:  Communication Skills Desire for Improvement Financial Resources/Insurance Housing Intimacy Social Support  ADL's:  Intact  Cognition: WNL  Sleep:  Good   Screenings: AIMS    Flowsheet Row [facility]18/[facility] in [facility]  AIMS Total Score 0   GAD-7    Flowsheet Row Video Visit from 05/29/2024 in  [facility] Video Visit from 03/08/2024 in [facility] Video Visit from 01/18/2024 in [facility] Video Visit from 10/18/2023 in [facility] Video Visit from 08/18/2023 in [facility]  Total GAD-7 Score 14 20 21 20 18    PHQ2-9    Flowsheet Row Video Visit from 05/29/2024 in [facility] Video Visit from 03/08/2024 in [facility] Video Visit from 01/18/2024 in [facility] Video Visit from 10/18/2023 in [facility] Video Visit from 08/18/2023 in [facility]  PHQ-2 Total Score 4 5 6 6 4   PHQ-9 Total Score 16 19 22 23 20    Flowsheet Row Video Visit from 03/08/2024 in [facility] Video Visit from 01/18/2024 in [facility] ED from 12/17/2023 in [facility] Emergency Department at [facility]  C-SSRS RISK CATEGORY No Risk No Risk No Risk     Assessment and Plan: Patient notes that her anxiety, depression, sleep, and mood has improved since her last visit.  She has not started Abilify .At this time Abilify  not restarted.  Koch informed patient that Abilify  could be restarted in the future if needed.  She will continue her other med history as prescribed.  1. Major depressive disorder, recurrent episode, moderate (HCC)  Continue- DULoxetine  (CYMBALTA ) 60 MG capsule; Take 2 capsules (120  mg total) by mouth daily.  Dispense: 60 capsule; Refill: 3 Continue- hydrOXYzine  (ATARAX ) 50 MG tablet; Take 1 tablet (50 mg total) by mouth every 6 (six) hours as needed.  Dispense: 120 tablet; Refill: 3  2. Generalized anxiety disorder  Continue- hydrOXYzine  (ATARAX ) 50 MG tablet; Take 1 tablet (50 mg total) by mouth every 6 (six) hours as needed.  Dispense: 120 tablet; Refill:  3   Follow-up in 2 months Follow-up with therapy   Zane FORBES Bach, NP 05/29/2024, 12:55 PM

## 2024-06-08 ENCOUNTER — Ambulatory Visit (HOSPITAL_COMMUNITY): Admitting: Mental Health

## 2024-06-08 ENCOUNTER — Telehealth (HOSPITAL_COMMUNITY): Payer: Self-pay | Admitting: Mental Health

## 2024-06-08 ENCOUNTER — Encounter (HOSPITAL_COMMUNITY): Payer: Self-pay

## 2024-06-08 NOTE — Telephone Encounter (Signed)
 Therapist contacted pt following failure to present to virtual therapy session with therapist sending link x 2 with no response. No answer; left HIPAA complaint voicemail. NS

## 2024-06-12 ENCOUNTER — Ambulatory Visit (HOSPITAL_COMMUNITY)
Admission: RE | Admit: 2024-06-12 | Discharge: 2024-06-12 | Disposition: A | Discharge status: Home / Self Care | Source: Ambulatory Visit | Attending: Cardiology | Admitting: Cardiology

## 2024-06-12 DIAGNOSIS — I10 Essential (primary) hypertension: Secondary | ICD-10-CM | POA: Diagnosis not present

## 2024-06-12 DIAGNOSIS — R072 Precordial pain: Secondary | ICD-10-CM | POA: Diagnosis not present

## 2024-06-12 DIAGNOSIS — Z8679 Personal history of other diseases of the circulatory system: Secondary | ICD-10-CM | POA: Diagnosis not present

## 2024-06-12 DIAGNOSIS — R0609 Other forms of dyspnea: Secondary | ICD-10-CM | POA: Insufficient documentation

## 2024-06-12 DIAGNOSIS — G4733 Obstructive sleep apnea (adult) (pediatric): Secondary | ICD-10-CM | POA: Diagnosis not present

## 2024-06-12 LAB — ECHOCARDIOGRAM COMPLETE
Area-P 1/2: 4.19 cm2
S' Lateral: 2.86 cm

## 2024-06-21 ENCOUNTER — Ambulatory Visit (INDEPENDENT_AMBULATORY_CARE_PROVIDER_SITE_OTHER)

## 2024-06-21 ENCOUNTER — Encounter (HOSPITAL_COMMUNITY): Payer: Self-pay

## 2024-06-21 DIAGNOSIS — F331 Major depressive disorder, recurrent, moderate: Secondary | ICD-10-CM

## 2024-06-21 NOTE — Progress Notes (Signed)
 51  THERAPIST PROGRESS NOTE  Virtual Visit via Video Note  I connected with Catherine Koch on 06/21/24 at  2:00 PM EST by a video enabled telemedicine application and verified that I am speaking with the correct person using two identifiers.  Clinician's camera was not working. However, client agreed to continue the session.   Location: Patient: Home Addres Provider: Clinician's Home Office   I discussed the limitations of evaluation and management by telemedicine and the availability of in person appointments. The patient expressed understanding and agreed to proceed.     I discussed the assessment and treatment plan with the patient. The patient was provided an opportunity to ask questions and all were answered. The patient agreed with the plan and demonstrated an understanding of the instructions.   The patient was advised to call back or seek an in-person evaluation if the symptoms worsen or if the condition fails to improve as anticipated.  I provided 49 minutes of non-face-to-face time during this encounter.   Othel Ada, [facility]   Session Time: 2:05- 2:54pm  Participation Level: Active  Behavioral Response: CasualAlertEuthymic  Type of Therapy: Individual Therapy  Treatment Goals addressed:   LTG: Increase coping skills to manage depression and improve ability to perform daily activities STG: Reduce overall depression score of 4 or less on the Patient Health Questionnaire (PHQ-9)  STG: [name] will identify cognitive patterns and beliefs that support depression  Interventions [name] will identify 3 personal goals for managing depression symptoms to work on during the current treatment episode Therapist will educate patient on cognitive distortions and the rationale for treatment of depression Complete Create a weekly activity schedule    ProgressTowards Goals: Initial  Interventions: CBT  Summary: [name] is a 49 year old single female that presented  today with diagnoses of Major depressive disorder, recurrent episode, moderate (HCC) [F33.1].  [name] reports looking towards more consistent sessions. [name] reports frustration with her health and limiting her from doing everyday task. [name] reports problems in marriage with lack of support with household responsibilities and husband's self-neglect. [name] reports divorce is not an option because of finances.   Suicidal/Homicidal: None; without intent or plan.   Therapist Response: Clinician met with [name] today for virtual therapy appointment and assessed for safety, medication compliance, and sobriety. [name] presented for session on time and was alert, oriented x5, with no evidence or self-report of active SI/HI. [name] denied any abuse of alcohol or illicit substances. [name] reported compliance with all medication. Clinician inquired about [name] current emotional ratings, as well as any significant changes in thoughts, feelings or behavior since previous check-in. [name] current symptoms have included fatigue, irritability, trouble sleeping,increased anger, and tearfulness, with updated PHQ9 screening today rated 14.  [name] reported ongoing issues with anxiety such as difficulty concentrating, irritability, restlessness, lack of sleep, fatigue, and tension, rating a 16 on GAD7 screening. Therapist builds rapport with client and helps in transition to new therapist.      06/21/2024    2:46 PM 05/29/2024   12:40 PM 03/08/2024   10:48 AM 01/18/2024   10:54 AM  GAD 7 : Generalized Anxiety Score  Nervous, Anxious, on Edge 3 2  3  3    Control/stop worrying 2 2  3  3    Worry too much - different things 2 2  3  3    Trouble relaxing 2 2  3  3    Restless 2 2  3  3    Easily annoyed or irritable 3 2  3  3   Afraid - awful might happen 2 2  2  3    Total GAD 7 Score 16 14 20 21   Anxiety Difficulty  Somewhat difficult Extremely difficult Somewhat difficult     Data saved with a previous flowsheet row  definition         06/21/2024    2:48 PM 05/29/2024   12:37 PM 03/08/2024   10:46 AM 01/18/2024   10:51 AM 10/18/2023   11:31 AM  Depression screen PHQ 2/9  Decreased Interest 2 2 2 3 3   Down, Depressed, Hopeless 2 2 3 3 3   PHQ - 2 Score 4 4 5 6 6   Altered sleeping 1 2 3 3 3   Tired, decreased energy 2 2 2 3 3   Change in appetite 0 2 2 2 3   Feeling bad or failure about yourself  2 2 2 2 2   Trouble concentrating 3 2 2 3 3   Moving slowly or fidgety/restless 2 2 3 3 3   Suicidal thoughts 0 0 0 0 0  PHQ-9 Score 14 16 19  22  23    Difficult doing work/chores  Somewhat difficult Very difficult Extremely dIfficult Very difficult     Data saved with a previous flowsheet row definition      Plan: Return again in 3 weeks.  Diagnosis: Major depressive disorder, recurrent episode, moderate (HCC) [F33.1]   Collaboration of Care: currently taking medication  Patient/Guardian was advised Release of Information must be obtained prior to any record release in order to collaborate their care with an outside provider. Patient/Guardian was advised if they have not already done so to contact the registration department to sign all necessary forms in order for us  to release information regarding their care.   Consent: Patient/Guardian gives verbal consent for treatment and assignment of benefits for services provided during this visit. Patient/Guardian expressed understanding and agreed to proceed.   Othel Ada, [facility] 06/21/2024

## 2024-07-03 ENCOUNTER — Ambulatory Visit: Admitting: Nurse Practitioner

## 2024-07-05 ENCOUNTER — Ambulatory Visit (HOSPITAL_COMMUNITY)

## 2024-08-09 ENCOUNTER — Telehealth (HOSPITAL_COMMUNITY): Admitting: Psychiatry
